# Patient Record
Sex: Female | Born: 1960
Health system: Southern US, Community
[De-identification: ages and names within clinical notes are randomized; demographics above are authoritative.]

## PROBLEM LIST (undated history)

## (undated) DIAGNOSIS — F329 Major depressive disorder, single episode, unspecified: Secondary | ICD-10-CM

## (undated) DIAGNOSIS — F419 Anxiety disorder, unspecified: Secondary | ICD-10-CM

## (undated) DIAGNOSIS — R51 Headache: Secondary | ICD-10-CM

## (undated) DIAGNOSIS — IMO0001 Reserved for inherently not codable concepts without codable children: Secondary | ICD-10-CM

## (undated) DIAGNOSIS — J45909 Unspecified asthma, uncomplicated: Secondary | ICD-10-CM

## (undated) DIAGNOSIS — I10 Essential (primary) hypertension: Secondary | ICD-10-CM

## (undated) DIAGNOSIS — E7800 Pure hypercholesterolemia, unspecified: Secondary | ICD-10-CM

## (undated) DIAGNOSIS — R112 Nausea with vomiting, unspecified: Secondary | ICD-10-CM

## (undated) DIAGNOSIS — E119 Type 2 diabetes mellitus without complications: Secondary | ICD-10-CM

## (undated) DIAGNOSIS — M1990 Unspecified osteoarthritis, unspecified site: Secondary | ICD-10-CM

## (undated) DIAGNOSIS — F32A Depression, unspecified: Secondary | ICD-10-CM

## (undated) DIAGNOSIS — R55 Syncope and collapse: Secondary | ICD-10-CM

## (undated) DIAGNOSIS — D649 Anemia, unspecified: Secondary | ICD-10-CM

## (undated) DIAGNOSIS — I471 Supraventricular tachycardia, unspecified: Secondary | ICD-10-CM

## (undated) DIAGNOSIS — R519 Headache, unspecified: Secondary | ICD-10-CM

## (undated) DIAGNOSIS — G4730 Sleep apnea, unspecified: Secondary | ICD-10-CM

## (undated) DIAGNOSIS — Z9889 Other specified postprocedural states: Secondary | ICD-10-CM

## (undated) DIAGNOSIS — K635 Polyp of colon: Secondary | ICD-10-CM

## (undated) DIAGNOSIS — K219 Gastro-esophageal reflux disease without esophagitis: Secondary | ICD-10-CM

## (undated) HISTORY — DX: Type 2 diabetes mellitus without complications: E11.9

## (undated) HISTORY — PX: COLONOSCOPY: SHX174

## (undated) HISTORY — PX: JOINT REPLACEMENT: SHX530

## (undated) HISTORY — PX: OTHER SURGICAL HISTORY: SHX169

## (undated) HISTORY — DX: Unspecified asthma, uncomplicated: J45.909

## (undated) HISTORY — DX: Polyp of colon: K63.5

## (undated) HISTORY — PX: KNEE ARTHROSCOPY: SUR90

## (undated) HISTORY — DX: Gastro-esophageal reflux disease without esophagitis: K21.9

## (undated) HISTORY — PX: ENDOMETRIAL ABLATION W/ NOVASURE: SUR434

## (undated) HISTORY — PX: SPINE SURGERY: SHX786

## (undated) HISTORY — PX: TUBAL LIGATION: SHX77

## (undated) HISTORY — DX: Supraventricular tachycardia, unspecified: I47.10

---

## 1998-01-20 ENCOUNTER — Other Ambulatory Visit: Admission: RE | Admit: 1998-01-20 | Discharge: 1998-01-20 | Payer: Self-pay | Admitting: Obstetrics & Gynecology

## 1998-02-20 ENCOUNTER — Encounter: Admission: RE | Admit: 1998-02-20 | Discharge: 1998-05-21 | Payer: Self-pay

## 1998-12-01 ENCOUNTER — Encounter: Admission: RE | Admit: 1998-12-01 | Discharge: 1999-01-08 | Payer: Self-pay

## 1999-06-25 ENCOUNTER — Other Ambulatory Visit: Admission: RE | Admit: 1999-06-25 | Discharge: 1999-06-25 | Payer: Self-pay | Admitting: Obstetrics & Gynecology

## 2000-05-02 ENCOUNTER — Other Ambulatory Visit: Admission: RE | Admit: 2000-05-02 | Discharge: 2000-05-02 | Payer: Self-pay | Admitting: Obstetrics & Gynecology

## 2001-03-20 ENCOUNTER — Encounter: Admission: RE | Admit: 2001-03-20 | Discharge: 2001-06-18 | Payer: Self-pay | Admitting: Neurology

## 2001-05-07 ENCOUNTER — Other Ambulatory Visit: Admission: RE | Admit: 2001-05-07 | Discharge: 2001-05-07 | Payer: Self-pay | Admitting: Obstetrics & Gynecology

## 2001-06-14 ENCOUNTER — Encounter: Payer: Self-pay | Admitting: Family Medicine

## 2001-06-14 ENCOUNTER — Ambulatory Visit (HOSPITAL_COMMUNITY): Admission: RE | Admit: 2001-06-14 | Discharge: 2001-06-14 | Payer: Self-pay | Admitting: Family Medicine

## 2001-06-18 ENCOUNTER — Encounter: Admission: RE | Admit: 2001-06-18 | Discharge: 2001-09-16 | Payer: Self-pay | Admitting: Neurology

## 2001-11-26 ENCOUNTER — Encounter: Admission: RE | Admit: 2001-11-26 | Discharge: 2002-02-24 | Payer: Self-pay | Admitting: Neurology

## 2002-03-20 ENCOUNTER — Encounter: Admission: RE | Admit: 2002-03-20 | Discharge: 2002-06-18 | Payer: Self-pay | Admitting: Neurology

## 2002-05-08 ENCOUNTER — Other Ambulatory Visit: Admission: RE | Admit: 2002-05-08 | Discharge: 2002-05-08 | Payer: Self-pay | Admitting: Obstetrics & Gynecology

## 2002-10-08 ENCOUNTER — Encounter: Admission: RE | Admit: 2002-10-08 | Discharge: 2003-01-06 | Payer: Self-pay | Admitting: Neurology

## 2003-04-03 ENCOUNTER — Encounter: Admission: RE | Admit: 2003-04-03 | Discharge: 2003-07-02 | Payer: Self-pay | Admitting: Neurology

## 2003-06-25 ENCOUNTER — Other Ambulatory Visit: Admission: RE | Admit: 2003-06-25 | Discharge: 2003-06-25 | Payer: Self-pay | Admitting: Obstetrics & Gynecology

## 2003-12-26 ENCOUNTER — Other Ambulatory Visit: Admission: RE | Admit: 2003-12-26 | Discharge: 2003-12-26 | Payer: Self-pay | Admitting: Obstetrics & Gynecology

## 2004-09-22 ENCOUNTER — Other Ambulatory Visit: Admission: RE | Admit: 2004-09-22 | Discharge: 2004-09-22 | Payer: Self-pay | Admitting: Obstetrics & Gynecology

## 2005-07-25 HISTORY — PX: CARPAL TUNNEL RELEASE: SHX101

## 2008-02-25 ENCOUNTER — Ambulatory Visit (HOSPITAL_COMMUNITY): Admission: RE | Admit: 2008-02-25 | Discharge: 2008-02-25 | Payer: Self-pay | Admitting: Obstetrics & Gynecology

## 2008-04-20 ENCOUNTER — Ambulatory Visit (HOSPITAL_COMMUNITY): Admission: RE | Admit: 2008-04-20 | Discharge: 2008-04-20 | Payer: Self-pay | Admitting: Family Medicine

## 2009-06-26 ENCOUNTER — Ambulatory Visit (HOSPITAL_BASED_OUTPATIENT_CLINIC_OR_DEPARTMENT_OTHER): Admission: RE | Admit: 2009-06-26 | Discharge: 2009-06-26 | Payer: Self-pay | Admitting: Orthopedic Surgery

## 2009-07-31 ENCOUNTER — Ambulatory Visit (HOSPITAL_BASED_OUTPATIENT_CLINIC_OR_DEPARTMENT_OTHER): Admission: RE | Admit: 2009-07-31 | Discharge: 2009-07-31 | Payer: Self-pay | Admitting: Orthopedic Surgery

## 2010-01-19 ENCOUNTER — Emergency Department (HOSPITAL_COMMUNITY): Admission: EM | Admit: 2010-01-19 | Discharge: 2010-01-19 | Payer: Self-pay | Admitting: Emergency Medicine

## 2010-07-11 ENCOUNTER — Emergency Department (HOSPITAL_COMMUNITY)
Admission: EM | Admit: 2010-07-11 | Discharge: 2010-07-12 | Payer: Self-pay | Source: Home / Self Care | Admitting: Emergency Medicine

## 2010-07-25 HISTORY — PX: BACK SURGERY: SHX140

## 2010-08-20 ENCOUNTER — Encounter
Admission: RE | Admit: 2010-08-20 | Discharge: 2010-08-20 | Payer: Self-pay | Source: Home / Self Care | Attending: Orthopedic Surgery | Admitting: Orthopedic Surgery

## 2010-09-10 ENCOUNTER — Other Ambulatory Visit: Payer: Self-pay | Admitting: Neurosurgery

## 2010-09-10 DIAGNOSIS — M545 Low back pain, unspecified: Secondary | ICD-10-CM

## 2010-09-14 ENCOUNTER — Other Ambulatory Visit: Payer: Self-pay

## 2010-10-04 LAB — DIFFERENTIAL
Basophils Absolute: 0 10*3/uL (ref 0.0–0.1)
Basophils Relative: 0 % (ref 0–1)
Eosinophils Absolute: 0 10*3/uL (ref 0.0–0.7)
Eosinophils Relative: 0 % (ref 0–5)
Lymphocytes Relative: 23 % (ref 12–46)
Lymphs Abs: 2 10*3/uL (ref 0.7–4.0)
Monocytes Absolute: 0.8 10*3/uL (ref 0.1–1.0)
Monocytes Relative: 8 % (ref 3–12)
Neutro Abs: 6.2 10*3/uL (ref 1.7–7.7)
Neutrophils Relative %: 69 % (ref 43–77)

## 2010-10-04 LAB — CBC
HCT: 41.9 % (ref 36.0–46.0)
Hemoglobin: 14.7 g/dL (ref 12.0–15.0)
MCH: 32.2 pg (ref 26.0–34.0)
MCHC: 35.1 g/dL (ref 30.0–36.0)
MCV: 91.7 fL (ref 78.0–100.0)
Platelets: 237 10*3/uL (ref 150–400)
RBC: 4.57 MIL/uL (ref 3.87–5.11)
RDW: 12.9 % (ref 11.5–15.5)
WBC: 9.1 10*3/uL (ref 4.0–10.5)

## 2010-10-04 LAB — COMPREHENSIVE METABOLIC PANEL
ALT: 35 U/L (ref 0–35)
AST: 24 U/L (ref 0–37)
Albumin: 3.8 g/dL (ref 3.5–5.2)
Alkaline Phosphatase: 128 U/L — ABNORMAL HIGH (ref 39–117)
BUN: 9 mg/dL (ref 6–23)
CO2: 23 mEq/L (ref 19–32)
Calcium: 9.5 mg/dL (ref 8.4–10.5)
Chloride: 101 mEq/L (ref 96–112)
Creatinine, Ser: 0.9 mg/dL (ref 0.4–1.2)
GFR calc Af Amer: >60 mL/min (ref >60)
GFR calc non Af Amer: >60 mL/min (ref >60)
Glucose, Bld: 103 mg/dL — ABNORMAL HIGH (ref 70–99)
Potassium: 3.4 mEq/L — ABNORMAL LOW (ref 3.5–5.1)
Sodium: 136 mEq/L (ref 135–145)
Total Bilirubin: 0.9 mg/dL (ref 0.3–1.2)
Total Protein: 7.5 g/dL (ref 6.0–8.3)

## 2010-10-04 LAB — POCT PREGNANCY, URINE: Preg Test, Ur: NEGATIVE

## 2010-10-04 LAB — URINALYSIS, ROUTINE W REFLEX MICROSCOPIC
Bilirubin Urine: NEGATIVE
Glucose, UA: NEGATIVE mg/dL
Hgb urine dipstick: NEGATIVE
Ketones, ur: 15 mg/dL — AB
Nitrite: NEGATIVE
Protein, ur: NEGATIVE mg/dL
Specific Gravity, Urine: 1.018 (ref 1.005–1.030)
Urobilinogen, UA: 1 mg/dL (ref 0.0–1.0)
pH: 7 (ref 5.0–8.0)

## 2010-10-10 LAB — BASIC METABOLIC PANEL
BUN: 7 mg/dL (ref 6–23)
CO2: 25 mEq/L (ref 19–32)
Calcium: 9 mg/dL (ref 8.4–10.5)
Chloride: 105 mEq/L (ref 96–112)
Creatinine, Ser: 0.68 mg/dL (ref 0.4–1.2)
GFR calc Af Amer: >60 mL/min (ref >60)
GFR calc non Af Amer: >60 mL/min (ref >60)
Glucose, Bld: 103 mg/dL — ABNORMAL HIGH (ref 70–99)
Potassium: 4.5 mEq/L (ref 3.5–5.1)
Sodium: 137 mEq/L (ref 135–145)

## 2010-10-10 LAB — DIFFERENTIAL
Basophils Absolute: 0.1 10*3/uL (ref 0.0–0.1)
Basophils Relative: 1 % (ref 0–1)
Eosinophils Absolute: 0.1 10*3/uL (ref 0.0–0.7)
Eosinophils Relative: 2 % (ref 0–5)
Lymphocytes Relative: 26 % (ref 12–46)
Lymphs Abs: 2.3 10*3/uL (ref 0.7–4.0)
Monocytes Absolute: 0.7 10*3/uL (ref 0.1–1.0)
Monocytes Relative: 8 % (ref 3–12)
Neutro Abs: 5.7 10*3/uL (ref 1.7–7.7)
Neutrophils Relative %: 64 % (ref 43–77)

## 2010-10-10 LAB — URINE MICROSCOPIC-ADD ON

## 2010-10-10 LAB — URINALYSIS, ROUTINE W REFLEX MICROSCOPIC
Bilirubin Urine: NEGATIVE
Glucose, UA: NEGATIVE mg/dL
Hgb urine dipstick: NEGATIVE
Ketones, ur: NEGATIVE mg/dL
Nitrite: NEGATIVE
Protein, ur: NEGATIVE mg/dL
Specific Gravity, Urine: 1.025 (ref 1.005–1.030)
Urobilinogen, UA: 1 mg/dL (ref 0.0–1.0)
pH: 6.5 (ref 5.0–8.0)

## 2010-10-10 LAB — CBC
HCT: 40.4 % (ref 36.0–46.0)
Hemoglobin: 14.2 g/dL (ref 12.0–15.0)
MCH: 33 pg (ref 26.0–34.0)
MCHC: 35.2 g/dL (ref 30.0–36.0)
MCV: 93.6 fL (ref 78.0–100.0)
Platelets: 264 10*3/uL (ref 150–400)
RBC: 4.31 MIL/uL (ref 3.87–5.11)
RDW: 13 % (ref 11.5–15.5)
WBC: 8.9 10*3/uL (ref 4.0–10.5)

## 2010-10-10 LAB — POCT I-STAT, CHEM 8
BUN: 11 mg/dL (ref 6–23)
Calcium, Ion: 1.09 mmol/L — ABNORMAL LOW (ref 1.12–1.32)
Chloride: 104 mEq/L (ref 96–112)
Creatinine, Ser: 0.8 mg/dL (ref 0.4–1.2)
Glucose, Bld: 108 mg/dL — ABNORMAL HIGH (ref 70–99)
HCT: 42 % (ref 36.0–46.0)
Hemoglobin: 14.3 g/dL (ref 12.0–15.0)
Potassium: 4 mEq/L (ref 3.5–5.1)
Sodium: 137 mEq/L (ref 135–145)
TCO2: 24 mmol/L (ref 0–100)

## 2010-10-10 LAB — POCT HEMOGLOBIN-HEMACUE: Hemoglobin: 14 g/dL (ref 12.0–15.0)

## 2010-10-11 ENCOUNTER — Ambulatory Visit
Admission: RE | Admit: 2010-10-11 | Discharge: 2010-10-11 | Disposition: A | Discharge status: Home / Self Care | Payer: BC Managed Care – PPO | Source: Ambulatory Visit | Attending: Neurosurgery | Admitting: Neurosurgery

## 2010-10-11 DIAGNOSIS — M545 Low back pain, unspecified: Secondary | ICD-10-CM

## 2010-10-14 ENCOUNTER — Other Ambulatory Visit: Payer: Self-pay | Admitting: Neurosurgery

## 2010-10-14 DIAGNOSIS — M25551 Pain in right hip: Secondary | ICD-10-CM

## 2010-10-16 ENCOUNTER — Ambulatory Visit
Admission: RE | Admit: 2010-10-16 | Discharge: 2010-10-16 | Disposition: A | Discharge status: Home / Self Care | Payer: BC Managed Care – PPO | Source: Ambulatory Visit | Attending: Neurosurgery | Admitting: Neurosurgery

## 2010-10-16 DIAGNOSIS — M25551 Pain in right hip: Secondary | ICD-10-CM

## 2010-10-26 LAB — BASIC METABOLIC PANEL
BUN: 7 mg/dL (ref 6–23)
CO2: 25 mEq/L (ref 19–32)
Calcium: 9.2 mg/dL (ref 8.4–10.5)
Chloride: 101 mEq/L (ref 96–112)
Creatinine, Ser: 0.78 mg/dL (ref 0.4–1.2)
GFR calc Af Amer: >60 mL/min (ref >60)
GFR calc non Af Amer: >60 mL/min (ref >60)
Glucose, Bld: 101 mg/dL — ABNORMAL HIGH (ref 70–99)
Potassium: 3.7 mEq/L (ref 3.5–5.1)
Sodium: 136 mEq/L (ref 135–145)

## 2010-10-26 LAB — POCT HEMOGLOBIN-HEMACUE: Hemoglobin: 13.7 g/dL (ref 12.0–15.0)

## 2010-12-07 NOTE — Op Note (Signed)
[name], [name]                ACCOUNT NO.:  [id number]   MEDICAL RECORD NO.:  [id number]          PATIENT TYPE:  AMB   LOCATION:  SDC                           FACILITY:  [facility]   PHYSICIAN:  Ilda Mori, M.D.   DATE OF BIRTH:  18-Dec-1960   DATE OF PROCEDURE:  DATE OF DISCHARGE:                               OPERATIVE REPORT   PREOPERATIVE DIAGNOSIS:  Menorrhagia.   POSTOPERATIVE DIAGNOSIS:  Menorrhagia.   PROCEDURE:  NovaSure endometrial ablation.   SURGEON:  Ilda Mori, MD.   ANESTHESIA:  General.   ESTIMATED BLOOD LOSS:  Minimal.   FINDINGS:  The uterine cavity sounded to a total of 9.3 cm, the cervix  sounded to 3.3 cm, and the uterus was anterior.   COMPLICATIONS:  None.   INDICATIONS:  This is a 50 year old gravida 3, para 2 female who has had  over a year of heavy painful of vaginal bleeding.  Options were  discussed with the patient, who elected to proceed with NovaSure  endometrial ablation.  A sonohysterogram was performed in the office  which showed an 8.5-cm uterus with no intracavitary lesions.  The  patient was brought to the operating room and general anesthesia was  induced.  She was placed in dorsal lithotomy position and the perineum  and vagina were prepped and draped in a sterile fashion.  The anterior  lip of the cervix was grasped with single-tooth tenaculum.  The cervix  was sounded to 3.3 cm.  The uterus was sounded to a total of 9.3 cm with  a cavity length being determined to be 6 cm.  The internal os was  dilated with Pratt dilator to a 27-French.  The NovaSure ablator was  introduced into the endometrial cavity to the fundus and then withdrawn  1 cm and deployed.  The final width of the NovaSure deployment was 4.8  cm.  The CO2 test was passed and the endometrium was ablated at a power  of 158 watts for 1 minute and 20 seconds.  The procedure was then  terminated.  The NovaSure ablator was removed without difficulty and the  patient  left the operating room in good condition.      Ilda Mori, M.D.  Electronically Signed     RK/MEDQ  D:  02/25/2008  T:  02/26/2008  Job:  [id number]

## 2010-12-12 ENCOUNTER — Emergency Department (HOSPITAL_COMMUNITY)
Admission: EM | Admit: 2010-12-12 | Discharge: 2010-12-12 | Disposition: A | Discharge status: Home / Self Care | Payer: BC Managed Care – PPO | Attending: Emergency Medicine | Admitting: Emergency Medicine

## 2010-12-12 DIAGNOSIS — M545 Low back pain, unspecified: Secondary | ICD-10-CM | POA: Insufficient documentation

## 2010-12-12 DIAGNOSIS — Z79899 Other long term (current) drug therapy: Secondary | ICD-10-CM | POA: Insufficient documentation

## 2010-12-12 DIAGNOSIS — M79609 Pain in unspecified limb: Secondary | ICD-10-CM | POA: Insufficient documentation

## 2010-12-12 DIAGNOSIS — F341 Dysthymic disorder: Secondary | ICD-10-CM | POA: Insufficient documentation

## 2010-12-12 DIAGNOSIS — G8929 Other chronic pain: Secondary | ICD-10-CM | POA: Insufficient documentation

## 2010-12-12 DIAGNOSIS — M533 Sacrococcygeal disorders, not elsewhere classified: Secondary | ICD-10-CM | POA: Insufficient documentation

## 2010-12-12 DIAGNOSIS — E7800 Pure hypercholesterolemia, unspecified: Secondary | ICD-10-CM | POA: Insufficient documentation

## 2010-12-12 DIAGNOSIS — I10 Essential (primary) hypertension: Secondary | ICD-10-CM | POA: Insufficient documentation

## 2010-12-27 ENCOUNTER — Ambulatory Visit (HOSPITAL_COMMUNITY)
Admission: RE | Admit: 2010-12-27 | Discharge: 2010-12-27 | Disposition: A | Discharge status: Home / Self Care | Payer: BC Managed Care – PPO | Source: Ambulatory Visit | Attending: Neurosurgery | Admitting: Neurosurgery

## 2010-12-27 ENCOUNTER — Encounter (HOSPITAL_COMMUNITY)
Admission: RE | Admit: 2010-12-27 | Discharge: 2010-12-27 | Disposition: A | Discharge status: Home / Self Care | Payer: BC Managed Care – PPO | Source: Ambulatory Visit | Attending: Neurosurgery | Admitting: Neurosurgery

## 2010-12-27 ENCOUNTER — Other Ambulatory Visit (HOSPITAL_COMMUNITY): Payer: Self-pay | Admitting: Neurosurgery

## 2010-12-27 DIAGNOSIS — Z01818 Encounter for other preprocedural examination: Secondary | ICD-10-CM | POA: Insufficient documentation

## 2010-12-27 DIAGNOSIS — Z01810 Encounter for preprocedural cardiovascular examination: Secondary | ICD-10-CM | POA: Insufficient documentation

## 2010-12-27 DIAGNOSIS — M48061 Spinal stenosis, lumbar region without neurogenic claudication: Secondary | ICD-10-CM

## 2010-12-27 DIAGNOSIS — G4730 Sleep apnea, unspecified: Secondary | ICD-10-CM | POA: Insufficient documentation

## 2010-12-27 DIAGNOSIS — Z01812 Encounter for preprocedural laboratory examination: Secondary | ICD-10-CM | POA: Insufficient documentation

## 2010-12-27 DIAGNOSIS — I10 Essential (primary) hypertension: Secondary | ICD-10-CM | POA: Insufficient documentation

## 2010-12-27 LAB — CBC
HCT: 40.4 % (ref 36.0–46.0)
Hemoglobin: 14.1 g/dL (ref 12.0–15.0)
MCH: 32.3 pg (ref 26.0–34.0)
MCHC: 34.9 g/dL (ref 30.0–36.0)
MCV: 92.4 fL (ref 78.0–100.0)
Platelets: 227 10*3/uL (ref 150–400)
RBC: 4.37 MIL/uL (ref 3.87–5.11)
RDW: 12.8 % (ref 11.5–15.5)
WBC: 6.9 10*3/uL (ref 4.0–10.5)

## 2010-12-27 LAB — DIFFERENTIAL
Basophils Absolute: 0 10*3/uL (ref 0.0–0.1)
Basophils Relative: 0 % (ref 0–1)
Eosinophils Absolute: 0.1 10*3/uL (ref 0.0–0.7)
Eosinophils Relative: 2 % (ref 0–5)
Lymphocytes Relative: 31 % (ref 12–46)
Lymphs Abs: 2.1 10*3/uL (ref 0.7–4.0)
Monocytes Absolute: 0.6 10*3/uL (ref 0.1–1.0)
Monocytes Relative: 9 % (ref 3–12)
Neutro Abs: 4 10*3/uL (ref 1.7–7.7)
Neutrophils Relative %: 58 % (ref 43–77)

## 2010-12-27 LAB — TYPE AND SCREEN
ABO/RH(D): A POS
Antibody Screen: NEGATIVE

## 2010-12-27 LAB — BASIC METABOLIC PANEL
BUN: 13 mg/dL (ref 6–23)
CO2: 26 mEq/L (ref 19–32)
Calcium: 9.8 mg/dL (ref 8.4–10.5)
Chloride: 100 mEq/L (ref 96–112)
Creatinine, Ser: 0.74 mg/dL (ref 0.4–1.2)
GFR calc Af Amer: >60 mL/min (ref >60)
GFR calc non Af Amer: >60 mL/min (ref >60)
Glucose, Bld: 104 mg/dL — ABNORMAL HIGH (ref 70–99)
Potassium: 4.2 mEq/L (ref 3.5–5.1)
Sodium: 137 mEq/L (ref 135–145)

## 2010-12-27 LAB — SURGICAL PCR SCREEN
MRSA, PCR: NEGATIVE
Staphylococcus aureus: POSITIVE — AB

## 2010-12-27 LAB — ABO/RH: ABO/RH(D): A POS

## 2011-01-07 ENCOUNTER — Inpatient Hospital Stay (HOSPITAL_COMMUNITY): Payer: BC Managed Care – PPO

## 2011-01-07 ENCOUNTER — Inpatient Hospital Stay (HOSPITAL_COMMUNITY)
Admission: RE | Admit: 2011-01-07 | Discharge: 2011-01-12 | DRG: 756 | Disposition: A | Discharge status: Home / Self Care | Payer: BC Managed Care – PPO | Source: Ambulatory Visit | Attending: Neurosurgery | Admitting: Neurosurgery

## 2011-01-07 DIAGNOSIS — G4733 Obstructive sleep apnea (adult) (pediatric): Secondary | ICD-10-CM | POA: Diagnosis present

## 2011-01-07 DIAGNOSIS — K219 Gastro-esophageal reflux disease without esophagitis: Secondary | ICD-10-CM | POA: Diagnosis present

## 2011-01-07 DIAGNOSIS — F3289 Other specified depressive episodes: Secondary | ICD-10-CM | POA: Diagnosis present

## 2011-01-07 DIAGNOSIS — I10 Essential (primary) hypertension: Secondary | ICD-10-CM | POA: Diagnosis present

## 2011-01-07 DIAGNOSIS — G8929 Other chronic pain: Secondary | ICD-10-CM | POA: Diagnosis present

## 2011-01-07 DIAGNOSIS — IMO0002 Reserved for concepts with insufficient information to code with codable children: Secondary | ICD-10-CM | POA: Diagnosis present

## 2011-01-07 DIAGNOSIS — M48061 Spinal stenosis, lumbar region without neurogenic claudication: Principal | ICD-10-CM | POA: Diagnosis present

## 2011-01-07 DIAGNOSIS — E669 Obesity, unspecified: Secondary | ICD-10-CM | POA: Diagnosis present

## 2011-01-07 DIAGNOSIS — F329 Major depressive disorder, single episode, unspecified: Secondary | ICD-10-CM | POA: Diagnosis present

## 2011-01-08 ENCOUNTER — Inpatient Hospital Stay (HOSPITAL_COMMUNITY): Payer: BC Managed Care – PPO

## 2011-01-08 LAB — CBC
HCT: 30 % — ABNORMAL LOW (ref 36.0–46.0)
Hemoglobin: 10.1 g/dL — ABNORMAL LOW (ref 12.0–15.0)
MCH: 31.8 pg (ref 26.0–34.0)
MCHC: 33.7 g/dL (ref 30.0–36.0)
MCV: 94.3 fL (ref 78.0–100.0)
Platelets: 205 10*3/uL (ref 150–400)
RBC: 3.18 MIL/uL — ABNORMAL LOW (ref 3.87–5.11)
RDW: 13 % (ref 11.5–15.5)
WBC: 10 10*3/uL (ref 4.0–10.5)

## 2011-01-08 LAB — URINALYSIS, ROUTINE W REFLEX MICROSCOPIC
Bilirubin Urine: NEGATIVE
Glucose, UA: NEGATIVE mg/dL
Hgb urine dipstick: NEGATIVE
Ketones, ur: NEGATIVE mg/dL
Leukocytes, UA: NEGATIVE
Nitrite: NEGATIVE
Protein, ur: NEGATIVE mg/dL
Specific Gravity, Urine: 1.021 (ref 1.005–1.030)
Urobilinogen, UA: 0.2 mg/dL (ref 0.0–1.0)
pH: 5.5 (ref 5.0–8.0)

## 2011-01-15 LAB — CULTURE, BLOOD (ROUTINE X 2)
Culture  Setup Time: 201206170210
Culture  Setup Time: 201206170210
Culture: NO GROWTH
Culture: NO GROWTH

## 2011-01-21 NOTE — Op Note (Signed)
NAMERHILEY, TARVER NO.:  [id number]  MEDICAL RECORD NO.:  [id number]  LOCATION:  3004                         FACILITY:  [facility]  PHYSICIAN:  Kathaleen Maser. Azalea Cedar, M.D.    DATE OF BIRTH:  [birth year]  DATE OF PROCEDURE:  01/07/2011 DATE OF DISCHARGE:                              OPERATIVE REPORT   PREOPERATIVE DIAGNOSIS:  L4-5 and L5-S1 stenosis with chronic back pain and radiculopathy.  POSTOPERATIVE DIAGNOSIS:  L4-5 and L5-S1 stenosis with chronic back pain and radiculopathy.  PROCEDURE NAME:  L4-5 and L5-S1 decompressive laminectomy with bilateral L4, L5, and S1 decompressive foraminotomies, more than it would be required for simple interbody fusion alone.  L4-5 and L5-S1 posterior lumbar interbody fusion utilizing tangent interbody allograft wedge, Telamon interbody PEEK cage, and local autografting.  L4-5 posterolateral arthrodesis utilizing segmental pedicle screw fixation and local autografting.  SURGEON:  Kathaleen Maser. Rupa Lagan, MD  ASSISTANT:  Tia Alert, MD  ANESTHESIA:  General endotracheal.  INDICATIONS:  [name] is a 50 year old female with history of back and right lower extremity pain failing all conservative management and workup demonstrates evidence of marked disk space collapse and severe foraminal stenosis at L4-5 and L5-S1.  [name] has failed all above conservative management and now presents for decompression and fusion in hopes of improving her symptoms.  OPERATIVE NOTE:  [name] was brought to Morgan operating room and placed on Morgan operating table in supine position.  After adequate level of anesthesia was achieved, [name] was positioned prone onto a Wilson frame and appropriately padded.  [name]'s lumbar region was prepped and draped sterilely.  A #10 blade was used to make curvilinear skin incision overlying Morgan L4-5 and L5-S1 levels.  This was carried sharply in Morgan midline.  A subperiosteal dissection was  performed exposing Morgan lamina and facet joints at L4-5 and S1 as well as transverse process of Morgan aforementioned levels.  Deep self-retaining retractor was placed.  Intraoperative fluoroscopy view was used and levels were confirmed.  Decompressive laminectomy was then performed using Leksell rongeurs, Kerrison rongeurs, and high-speed drill to remove Morgan entire lamina of L4, entire lamina of L5, superior aspect of lamina of S1.  Inferior facetectomies at L4-L5 were performed bilaterally.  Superior facetectomies of L5 and S1 was performed bilaterally.  Ligamentum flavum was elevated and resected in a piecemeal fashion.  Wide decompressive foraminotomies were then performed along Morgan course of exiting L4, L5, and S1 nerve roots.  Epidural venous plexus was coagulated and cut.  Bilateral diskectomies were then performed at L4-5 and L5-S1.  Disk space was then distracted up to 8 mm and 8 mm distractor left in [name]'s right side.  Morgan thecal sac and nerve roots were protected to Morgan left side.  Disk space was then reamed and then cut with 8-mm tangent instrument.  Soft tissues were removed from Morgan interspaces.  An 8 x 22 mm Telamon cage was then packed into place and recessed approximately 2 mm from Morgan posterior cortical margin of L4.  Distractors were removed from [name]'s contralateral side.  Disk space was then reamed and then cut with an 8-mm tangent  instrument on Morgan right side.  Thecal sac and nerve roots were protected on Morgan right side.  Disk space was then further curettaged.  Morselized autograft was packed in Morgan interspace for later fusion.  An 8 x 26 mm tangent wedge was then packed into place and recessed approximately 2 mm from Morgan posterior margin of L4.  Procedure was then repeated at L5-S1 again without complication.  Pedicles at L4, L5, and S1 were then identified bilaterally using surface landmarks and intraoperative fluoroscopy.  Superficial bone over Morgan  pedicle was then removed using high-speed drill.  Morgan pedicle hole was then probed using pedicle awl. Each pedicle awl track was then tapped with a 5.25- mm screw tapper. Each screw hole was then probed and found to be solidly within bone.  A 6.75 x 45 mm radius screw was placed bilaterally at L4, a 5.75 x 40 mm screw was placed at L5, and a 6.75 x 40 mm screw was placed bilaterally at S1.  Transverse processes of sacral ala were then decorticated utilizing a high-speed drill.  Morselized autograft was packed posterolaterally for later fusion.  Locking caps were placed over Morgan screw heads and locking caps were then engaged with construct under compression.  Final images revealed good position of Morgan bone grafts, hardware at proper operative level, and normal alignment of spine. Wound was then irrigated with antibiotic solution.  Gelfoam was placed topically for hemostasis and found to be good.  Microscope and retractor system were removed.  Hemostasis of muscle was achieved with electrocautery.  Morgan wound was then closed in layers with Vicryl sutures.  Steri-Strips and sterile dressing were applied.  There were no complications.  [name] tolerated Morgan procedure well and she returned to Morgan recovery room postoperatively.          ______________________________ Kathaleen Maser Ellinor Test, M.D.     HAP/MEDQ  D:  01/07/2011  T:  01/08/2011  Job:  [id number]  Electronically Signed by Julio Sicks M.D. on 01/21/2011 08:01:43 AM

## 2011-02-22 ENCOUNTER — Ambulatory Visit
Admission: RE | Admit: 2011-02-22 | Discharge: 2011-02-22 | Disposition: A | Discharge status: Home / Self Care | Payer: BC Managed Care – PPO | Source: Ambulatory Visit | Attending: Neurosurgery | Admitting: Neurosurgery

## 2011-02-22 ENCOUNTER — Other Ambulatory Visit: Payer: Self-pay | Admitting: Neurosurgery

## 2011-02-22 DIAGNOSIS — M48061 Spinal stenosis, lumbar region without neurogenic claudication: Secondary | ICD-10-CM

## 2011-04-08 NOTE — Discharge Summary (Signed)
  [name], [name] NO.:  [id number]  MEDICAL RECORD NO.:  [id number]  LOCATION:  3004                         FACILITY:  [facility]  PHYSICIAN:  Kathaleen Maser. Paris Hohn, M.D.    DATE OF BIRTH:  07-03-1961  DATE OF ADMISSION:  01/07/2011 DATE OF DISCHARGE:  01/12/2011                              DISCHARGE SUMMARY   FINAL DIAGNOSES:  L4-5, L5-S1 stenosis with chronic back pain and radiculopathy.  HISTORY OF PRESENT ILLNESS:  [name] is a 50 year old female with history of back and right lower extremity pain failing all conservative management.  Workup demonstrates evidence of marked disk space collapse and severe foraminal stenosis at L4-5 and L5-S1.  The patient presents now for decompression and fusion in hopes of improving her symptoms.  HOSPITAL COURSE:  The patient in the operating room, uncomplicated L4-5 and L5-S1 decompression and fusion with this situation was performed. Postop, the patient has done well.  She had intact motor and sensory function.  Her preoperative pain is much improved.  Back pain initially was quite substantial and gradually eased with time.  The patient was rapidly mobilized with the patient's physical and occupational therapy. She is up ambulating with minimal difficulty.  Wound is healing well. Plan is for discharge home.  DISCHARGE DISPOSITION:  The patient will be discharged home.  Will follow up in my office in 1 week.          ______________________________ Kathaleen Maser. [name], M.D.     HAP/MEDQ  D:  04/07/2011  T:  04/07/2011  Job:  [id number]  Electronically Signed by Julio Sicks M.D. on 04/08/2011 12:04:32 PM

## 2011-04-16 ENCOUNTER — Emergency Department (HOSPITAL_COMMUNITY): Payer: BC Managed Care – PPO

## 2011-04-16 ENCOUNTER — Emergency Department (HOSPITAL_COMMUNITY)
Admission: EM | Admit: 2011-04-16 | Discharge: 2011-04-16 | Disposition: A | Discharge status: Home / Self Care | Payer: BC Managed Care – PPO | Attending: Emergency Medicine | Admitting: Emergency Medicine

## 2011-04-16 DIAGNOSIS — Z79899 Other long term (current) drug therapy: Secondary | ICD-10-CM | POA: Insufficient documentation

## 2011-04-16 DIAGNOSIS — M549 Dorsalgia, unspecified: Secondary | ICD-10-CM | POA: Insufficient documentation

## 2011-04-16 DIAGNOSIS — I10 Essential (primary) hypertension: Secondary | ICD-10-CM | POA: Insufficient documentation

## 2011-04-21 ENCOUNTER — Ambulatory Visit
Admission: RE | Admit: 2011-04-21 | Discharge: 2011-04-21 | Disposition: A | Discharge status: Home / Self Care | Payer: BC Managed Care – PPO | Source: Ambulatory Visit | Attending: Neurosurgery | Admitting: Neurosurgery

## 2011-04-21 ENCOUNTER — Other Ambulatory Visit: Payer: Self-pay | Admitting: Neurosurgery

## 2011-04-21 DIAGNOSIS — M48061 Spinal stenosis, lumbar region without neurogenic claudication: Secondary | ICD-10-CM

## 2011-04-22 LAB — BASIC METABOLIC PANEL
BUN: 11
CO2: 25
Calcium: 9
Chloride: 101
Creatinine, Ser: 0.75
GFR calc Af Amer: >60
GFR calc non Af Amer: >60
Glucose, Bld: 117 — ABNORMAL HIGH
Potassium: 3.5
Sodium: 135

## 2011-04-22 LAB — CBC
HCT: 38.2
Hemoglobin: 13.1
MCHC: 34.3
MCV: 89.1
Platelets: 251
RBC: 4.29
RDW: 15.4
WBC: 6.6

## 2011-08-23 ENCOUNTER — Other Ambulatory Visit: Payer: Self-pay | Admitting: Neurosurgery

## 2011-08-23 DIAGNOSIS — M545 Low back pain, unspecified: Secondary | ICD-10-CM

## 2011-09-01 ENCOUNTER — Ambulatory Visit
Admission: RE | Admit: 2011-09-01 | Discharge: 2011-09-01 | Disposition: A | Discharge status: Home / Self Care | Payer: BC Managed Care – PPO | Source: Ambulatory Visit | Attending: Neurosurgery | Admitting: Neurosurgery

## 2011-09-01 DIAGNOSIS — M545 Low back pain, unspecified: Secondary | ICD-10-CM

## 2012-02-23 ENCOUNTER — Emergency Department (HOSPITAL_COMMUNITY)
Admission: EM | Admit: 2012-02-23 | Discharge: 2012-02-23 | Disposition: A | Discharge status: Home / Self Care | Payer: BC Managed Care – PPO | Attending: Emergency Medicine | Admitting: Emergency Medicine

## 2012-02-23 ENCOUNTER — Emergency Department (HOSPITAL_COMMUNITY): Payer: BC Managed Care – PPO

## 2012-02-23 ENCOUNTER — Encounter (HOSPITAL_COMMUNITY): Payer: Self-pay | Admitting: Emergency Medicine

## 2012-02-23 DIAGNOSIS — S0083XA Contusion of other part of head, initial encounter: Secondary | ICD-10-CM | POA: Insufficient documentation

## 2012-02-23 DIAGNOSIS — Z981 Arthrodesis status: Secondary | ICD-10-CM | POA: Insufficient documentation

## 2012-02-23 DIAGNOSIS — R220 Localized swelling, mass and lump, head: Secondary | ICD-10-CM | POA: Insufficient documentation

## 2012-02-23 DIAGNOSIS — F329 Major depressive disorder, single episode, unspecified: Secondary | ICD-10-CM | POA: Insufficient documentation

## 2012-02-23 DIAGNOSIS — S1093XA Contusion of unspecified part of neck, initial encounter: Secondary | ICD-10-CM | POA: Insufficient documentation

## 2012-02-23 DIAGNOSIS — S0003XA Contusion of scalp, initial encounter: Secondary | ICD-10-CM | POA: Insufficient documentation

## 2012-02-23 DIAGNOSIS — M25579 Pain in unspecified ankle and joints of unspecified foot: Secondary | ICD-10-CM | POA: Insufficient documentation

## 2012-02-23 DIAGNOSIS — G43909 Migraine, unspecified, not intractable, without status migrainosus: Secondary | ICD-10-CM | POA: Insufficient documentation

## 2012-02-23 DIAGNOSIS — M25569 Pain in unspecified knee: Secondary | ICD-10-CM | POA: Insufficient documentation

## 2012-02-23 DIAGNOSIS — F32A Depression, unspecified: Secondary | ICD-10-CM | POA: Insufficient documentation

## 2012-02-23 DIAGNOSIS — E7800 Pure hypercholesterolemia, unspecified: Secondary | ICD-10-CM | POA: Insufficient documentation

## 2012-02-23 DIAGNOSIS — I10 Essential (primary) hypertension: Secondary | ICD-10-CM | POA: Insufficient documentation

## 2012-02-23 DIAGNOSIS — M545 Low back pain, unspecified: Secondary | ICD-10-CM | POA: Insufficient documentation

## 2012-02-23 DIAGNOSIS — T1490XA Injury, unspecified, initial encounter: Secondary | ICD-10-CM | POA: Insufficient documentation

## 2012-02-23 DIAGNOSIS — R221 Localized swelling, mass and lump, neck: Secondary | ICD-10-CM | POA: Insufficient documentation

## 2012-02-23 DIAGNOSIS — R51 Headache: Secondary | ICD-10-CM | POA: Insufficient documentation

## 2012-02-23 HISTORY — DX: Major depressive disorder, single episode, unspecified: F32.9

## 2012-02-23 HISTORY — DX: Depression, unspecified: F32.A

## 2012-02-23 HISTORY — DX: Anxiety disorder, unspecified: F41.9

## 2012-02-23 HISTORY — DX: Essential (primary) hypertension: I10

## 2012-02-23 HISTORY — DX: Pure hypercholesterolemia, unspecified: E78.00
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
## 2012-02-23 NOTE — ED Notes (Signed)
Patient transported to CT 

## 2012-02-23 NOTE — ED Notes (Signed)
Patient transported to X-ray 

## 2012-02-23 NOTE — ED Notes (Signed)
C-collar removed per ED MD.

## 2012-02-23 NOTE — ED Notes (Signed)
Denies LOC. C/o h/a to above left eyebrow. Reports no other injuries. Stated, "I have a lot of back issues", denies increase or change in back pain. Pt appears drowsy, oriented x4 & answers all questions appropriately.
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
## 2012-06-04 ENCOUNTER — Encounter: Payer: Self-pay | Admitting: Cardiovascular Disease

## 2012-06-07 ENCOUNTER — Ambulatory Visit: Payer: BC Managed Care – PPO | Attending: Physician Assistant | Admitting: Sleep Medicine

## 2012-06-07 DIAGNOSIS — G4710 Hypersomnia, unspecified: Secondary | ICD-10-CM | POA: Insufficient documentation

## 2012-06-13 DIAGNOSIS — G4710 Hypersomnia, unspecified: Secondary | ICD-10-CM

## 2012-06-13 DIAGNOSIS — G4730 Sleep apnea, unspecified: Secondary | ICD-10-CM

## 2012-06-13 NOTE — Procedures (Signed)
NAMEJOHNNA, BOLLIER                 ACCOUNT NO.:  [id number]  MEDICAL RECORD NO.:  [id number]          PATIENT TYPE:  OUT  LOCATION:  SLEEP LAB                     FACILITY:  [facility]  PHYSICIAN:  Barbaraann Share, MD,FCCPDATE OF BIRTH:  August 28[birth year] 1962  DATE OF STUDY:  06/07/2012                           NOCTURNAL POLYSOMNOGRAM  REFERRING PHYSICIAN:  Ernestina Penna, M.D.  LOCATION:  Sleep Lab.  REFERRING PHYSICIAN:  Helene Kelp, PA-C.  INDICATION FOR STUDY:  Hypersomnia with sleep apnea.  EPWORTH SLEEPINESS SCORE:  1.  MEDICATIONS:  SLEEP ARCHITECTURE:  The patient had a total sleep time of 225 minutes with very little slow-wave sleep or REM.  Sleep onset latency was mildly prolonged at 44 minutes, and REM onset was prolonged at 212 minutes. Sleep efficiency was poor at 54%.  RESPIRATORY DATA:  The patient was found to have no apneas and only 1 obstructive hypopnea, giving her an apnea/hypopnea index of only 0.3 events per hour.  The patient was noted to have loud snoring throughout. She did not meet split night protocol secondary to the small numbers of events.  OXYGEN DATA:  There was O2 desaturation as low as 94%.  CARDIAC DATA:  Rare PVC noted, but no clinically significant arrhythmias were seen.  MOVEMENT-PARASOMNIA:  The patient was found to have small numbers of periodic limb movements that did not appear to disrupt sleep.  There were no abnormal behaviors noted.  IMPRESSION/RECOMMENDATION: 1. Small numbers of obstructive events, which do not meet the AHI     criteria for the obstructive sleep apnea syndrome.  There was no     significant oxygen desaturation.  The patient was noted to have     fragmented sleep with frequent episodes of alpha intrusion on her     EEG that can be seen in patients with chronic anxiety and other     psychiatric disorders.  Clinical correlation is suggested.  The     patient should also be encouraged to work aggressively on weight  loss. 2. Rare premature ventricular contraction noted, but no clinically     significant arrhythmias were seen.      Barbaraann Share, MD,FCCP Diplomate, American Board of Sleep Medicine    [facility]/MEDQ  D:  06/13/2012 08:15:04  T:  06/13/2012 12:46:13  Job:  [id number]

## 2012-06-19 LAB — HM MAMMOGRAPHY

## 2012-10-02 ENCOUNTER — Ambulatory Visit (INDEPENDENT_AMBULATORY_CARE_PROVIDER_SITE_OTHER): Payer: BC Managed Care – PPO | Admitting: Cardiovascular Disease

## 2012-10-02 ENCOUNTER — Encounter: Payer: Self-pay | Admitting: Cardiovascular Disease

## 2012-10-02 VITALS — BP 138/76 | HR 91 | Ht 65.0 in | Wt 290.4 lb

## 2012-10-02 DIAGNOSIS — R0602 Shortness of breath: Secondary | ICD-10-CM

## 2012-10-02 DIAGNOSIS — R072 Precordial pain: Secondary | ICD-10-CM | POA: Insufficient documentation

## 2012-10-02 NOTE — Patient Instructions (Addendum)
Your physician has requested that you have an echocardiogram. Echocardiography is a painless test that uses sound waves to create images of your heart. It provides your doctor with information about the size and shape of your heart and how well your heart's chambers and valves are working. This procedure takes approximately one hour. There are no restrictions for this procedure.  Your physician has requested that you have a lexiscan myoview. For further information please visit [email or web address] Please follow instruction sheet, as given.  Your physician recommends that you continue on your current medications as directed. Please refer to the Current Medication list given to you today.  Your physician wants you to follow-up in: 6 MONTHS with Dr Excell Seltzer.  You will receive a reminder letter in the mail two months in advance. If you don't receive a letter, please call our office to schedule the follow-up appointment.

## 2012-10-02 NOTE — Progress Notes (Signed)
HPI:   52 year old woman presenting for initial cardiac evaluation. The patient has a multitude of symptoms that she is concerned about. These include exertional chest discomfort, exertional shortness of breath, and leg swelling. She's had shortness of breath with activity now for about 5 years. This is progressive. She has not had resting shortness of breath. She denies orthopnea or PND. She has had palpitations. She's noted leg swelling and generalized fatigue. She has gained a great deal of weight over the last several years. When she walks or exerts herself, she feels a pressure-like sensation across the front of her chest and in the left arm. This resolves with rest. She has no personal history of cardiac problems. She has no cough, fever, chills, or other systemic complaints.  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. She is a lifetime nonsmoker and she does not drink alcohol. She does have a family history of coronary artery disease. Her father had a myocardial infarction in his 47s. Her mother, sister, and brother all have hypertension.  Outpatient Encounter Prescriptions as of 10/02/2012  Medication Sig Dispense Refill  . ALPRAZolam (XANAX) 0.5 MG tablet Take 1 mg by mouth at bedtime.       Marland Kitchen amLODipine (NORVASC) 10 MG tablet Take 10 mg by mouth daily.      . Cholecalciferol (VITAMIN D) 2000 UNITS CAPS Take 1 capsule by mouth every evening.      . Coenzyme Q10 (CO Q-10) 200 MG CAPS Take by mouth.      . furosemide (LASIX) 20 MG tablet Take 20 mg by mouth daily.      Marland Kitchen lisinopril (PRINIVIL,ZESTRIL) 20 MG tablet Take 20 mg by mouth daily.      . Melatonin 10 MG CAPS Take 20 mg by mouth at bedtime.      . pravastatin (PRAVACHOL) 40 MG tablet Take 40 mg by mouth every evening.      . promethazine (PHENERGAN) 25 MG tablet Take 25 mg by mouth every 6 (six) hours as needed. For nausea      . sertraline (ZOLOFT) 50 MG tablet Take 100 mg by mouth 2 (two)  times daily.       . SUMAtriptan (IMITREX) 100 MG tablet Take 100 mg by mouth every 2 (two) hours as needed. For migraines      . zolpidem (AMBIEN CR) 12.5 MG CR tablet Take 12.5 mg by mouth at bedtime.      Marland Kitchen HYDROcodone-acetaminophen (NORCO) 10-325 MG per tablet Take 1 tablet by mouth every 6 (six) hours as needed. For pain      . [DISCONTINUED] zolpidem (AMBIEN) 10 MG tablet Take 10 mg by mouth at bedtime.       No facility-administered encounter medications on file as of 10/02/2012.    Ceclor and Venlafaxine  Past Medical History  Diagnosis Date  . Chronic back pain   . Anxiety   . Depression   . Hypertension   . High cholesterol   . Migraines     Past Surgical History  Procedure Laterality Date  . Back surgery    . Endometrial ablation w/ novasure      History   Social History  . Marital Status: Married    Spouse Name: N/A    Number of Children: N/A  . Years of Education: N/A   Occupational History  . Not on file.   Social History Main Topics  . Smoking status: Never Smoker   .  Smokeless tobacco: Not on file  . Alcohol Use: No  . Drug Use: No  . Sexually Active:    Other Topics Concern  . Not on file   Social History Narrative  . No narrative on file   FHx: Father MI in 54s, mother with "hole in heart"  ROS:  General: no fevers/chills/night sweats, positive for weight gain Eyes: no blurry vision, diplopia, or amaurosis ENT: no sore throat or hearing loss Resp: no cough, wheezing, or hemoptysis CV: See history of present illness GI: no abdominal pain, nausea, vomiting, diarrhea, or constipation GU: no dysuria, frequency, or hematuria Skin: no rash Neuro: no headache, numbness, tingling, or weakness of extremities Musculoskeletal: Positive for joint pains Heme: no bleeding, DVT, or easy bruising Endo: no polydipsia or polyuria  BP 138/76  Pulse 91  Ht 5\' 5"  (1.651 m)  Wt 131.725 kg (290 lb 6.4 oz)  BMI 48.33 kg/m2  PHYSICAL EXAM: Pt is alert  and oriented, pleasant morbidly obese woman in no acute distress HEENT: normal Neck: JVP normal. Carotid upstrokes normal without bruits. No thyromegaly. Lungs: equal expansion, clear bilaterally CV: Apex is discrete and nondisplaced, RRR without murmur or gallop Abd: soft, NT, +BS, no bruit, no hepatosplenomegaly Back: no CVA tenderness Ext: 1+ edema below the knees bilaterally        Femoral pulses 2+= without bruits        DP/PT pulses intact and = Skin: warm and dry without rash Neuro: CNII-XII intact             Strength intact = bilaterally  EKG:  Normal sinus rhythm 91 beats per minute, age-indeterminate anterolateral infarction   ASSESSMENT AND PLAN: 1. Exertional chest tightness. The patient has cardiovascular risk factors of hyperlipidemia, hypertension, and family history. Considering her risk profile and symptoms associated with exertion, I have recommended a Myoview stress scan. I suspect she will require a 2 day study considering her body habitus.  2. Shortness of breath. This is probably multifactorial. Will check an echocardiogram to evaluate for structural heart disease. If her cardiac studies are negative, I would attribute most of her symptoms to her morbid obesity.  3. Hypertension, essential. Blood pressure appears to be controlled on a combination of amlodipine, furosemide, and lisinopril  For followup I will plan on seeing her back in 6 months. She was counseled extensively about diet and exercise. She understands that significant weight loss will be the primary means of reducing long-term health problems.  Tonny Bollman 10/02/2012 5:34 PM

## 2012-10-10 ENCOUNTER — Ambulatory Visit (HOSPITAL_COMMUNITY): Payer: BC Managed Care – PPO | Attending: Cardiology | Admitting: Radiology

## 2012-10-10 VITALS — BP 136/78 | Ht 65.0 in | Wt 289.0 lb

## 2012-10-10 DIAGNOSIS — R0602 Shortness of breath: Secondary | ICD-10-CM

## 2012-10-10 DIAGNOSIS — I10 Essential (primary) hypertension: Secondary | ICD-10-CM | POA: Insufficient documentation

## 2012-10-10 DIAGNOSIS — R0789 Other chest pain: Secondary | ICD-10-CM

## 2012-10-10 DIAGNOSIS — R0609 Other forms of dyspnea: Secondary | ICD-10-CM | POA: Insufficient documentation

## 2012-10-10 DIAGNOSIS — R0989 Other specified symptoms and signs involving the circulatory and respiratory systems: Secondary | ICD-10-CM

## 2012-10-10 DIAGNOSIS — R072 Precordial pain: Secondary | ICD-10-CM

## 2012-10-10 DIAGNOSIS — R002 Palpitations: Secondary | ICD-10-CM | POA: Insufficient documentation

## 2012-10-10 MED ORDER — TECHNETIUM TC 99M SESTAMIBI GENERIC - CARDIOLITE
30.0000 | Freq: Once | INTRAVENOUS | Status: AC | PRN
  Administered 2012-10-10: 30 via INTRAVENOUS

## 2012-10-10 MED ORDER — REGADENOSON 0.4 MG/5ML IV SOLN
0.4000 mg | Freq: Once | INTRAVENOUS | Status: AC
  Administered 2012-10-10: 0.4 mg via INTRAVENOUS

## 2012-10-10 NOTE — Progress Notes (Signed)
[facility] SITE 3 NUCLEAR MED [address] [place], [place] [address] [phone]    Cardiology Nuclear Med Study  [name] is a 52 y.o. female     MRN : [id number]     DOB: [birth year]  Procedure Date: 10/10/2012  Nuclear Med Background Indication for Stress Test:  Evaluation for Ischemia History:  Abnormal EKG: prior ALWMI  chronic back pain Cardiac Risk Factors: Family History - CAD, Hypertension and Lipids  Symptoms:  Chest Pressure with Exertion, DOE, Fatigue and Palpitations   Nuclear Pre-Procedure Caffeine/Decaff Intake:  None NPO After: 7:30pm   Lungs:  clear O2 Sat: 96% on room air. IV 0.9% NS with Angio Cath:  22g  IV Site: R Antecubital  IV Started by:  Milana Na, EMT-P  Chest Size (in):  42 Cup Size: DD  Height: 5\' 5"  (1.651 m)  Weight:  289 lb (131.09 kg)  BMI:  Body mass index is 48.09 kg/(m^2). Tech Comments:  Rx this am except Lasix    Nuclear Med Study 1 or 2 day study: 2 day  Stress Test Type:  Eugenie Birks  Reading MD: Willa Rough, MD  Order Authorizing Provider:  M.Cooper MD  Resting Radionuclide: Technetium 35m Sestamibi  Resting Radionuclide Dose: 33.0 mCi on 10/11/12   Stress Radionuclide:  Technetium 13m Sestamibi  Stress Radionuclide Dose: 33.0 mCi on 10/10/12           Stress Protocol Rest HR: 80 Stress HR: 120  Rest BP: 136/78 Stress BP: 155/95  Exercise Time (min): n/a METS: n/a   Predicted Max HR: 171 bpm % Max HR: 70.18 bpm Rate Pressure Product: 21308   Dose of Adenosine (mg):  n/a Dose of Lexiscan: 0.4 mg  Dose of Atropine (mg): n/a Dose of Dobutamine: n/a mcg/kg/min (at max HR)  Stress Test Technologist: Milana Na, EMT-P  Nuclear Technologist:  Domenic Polite, CNMT     Rest Procedure:  Myocardial perfusion imaging was performed at rest 45 minutes following the intravenous administration of Technetium 22m Sestamibi. Rest ECG: NSR, cannot R/O prior anterior MI, nonspecific ST changes.  Stress  Procedure:  The patient received IV Lexiscan 0.4 mg over 15-seconds.  Technetium 36m Sestamibi injected at 30-seconds. This patient had weakness, sob, chest tightness, and a rare pvc with the Lexiscan infusion. Quantitative spect images were obtained after a 45 minute delay. Stress ECG: No significant ST segment change suggestive of ischemia.  QPS Raw Data Images:  Acquisition technically good; normal left ventricular size. Stress Images:  There is decreased uptake in the anterior wall. Rest Images:  There is decreased uptake in the anterior wall. Subtraction (SDS):  No evidence of ischemia. Transient Ischemic Dilatation (Normal <1.22):  1.10 Lung/Heart Ratio (Normal <0.45):  0.24  Quantitative Gated Spect Images QGS EDV:  92 ml QGS ESV:  26 ml  Impression Exercise Capacity:  Lexiscan with no exercise. BP Response:  Normal blood pressure response. Clinical Symptoms:  There is chest pain and dyspnea. ECG Impression:  No significant ST segment change suggestive of ischemia. Comparison with Prior Nuclear Study: No images to compare  Overall Impression:  Normal stress nuclear study with a small, mild, fixed anterior defect consistent with soft tissue attenuation but no ischemia.  LV Ejection Fraction: 72%.  LV Wall Motion:  NL LV Function; NL Wall Motion  Olga Millers

## 2012-10-11 ENCOUNTER — Ambulatory Visit (HOSPITAL_BASED_OUTPATIENT_CLINIC_OR_DEPARTMENT_OTHER): Payer: BC Managed Care – PPO

## 2012-10-11 ENCOUNTER — Ambulatory Visit (HOSPITAL_COMMUNITY): Payer: BC Managed Care – PPO | Attending: Cardiology | Admitting: Radiology

## 2012-10-11 DIAGNOSIS — R072 Precordial pain: Secondary | ICD-10-CM

## 2012-10-11 DIAGNOSIS — R5381 Other malaise: Secondary | ICD-10-CM | POA: Insufficient documentation

## 2012-10-11 DIAGNOSIS — R0609 Other forms of dyspnea: Secondary | ICD-10-CM | POA: Insufficient documentation

## 2012-10-11 DIAGNOSIS — R609 Edema, unspecified: Secondary | ICD-10-CM | POA: Insufficient documentation

## 2012-10-11 DIAGNOSIS — R0989 Other specified symptoms and signs involving the circulatory and respiratory systems: Secondary | ICD-10-CM

## 2012-10-11 DIAGNOSIS — R0789 Other chest pain: Secondary | ICD-10-CM | POA: Insufficient documentation

## 2012-10-11 DIAGNOSIS — Z8249 Family history of ischemic heart disease and other diseases of the circulatory system: Secondary | ICD-10-CM | POA: Insufficient documentation

## 2012-10-11 DIAGNOSIS — I519 Heart disease, unspecified: Secondary | ICD-10-CM | POA: Insufficient documentation

## 2012-10-11 DIAGNOSIS — R0602 Shortness of breath: Secondary | ICD-10-CM

## 2012-10-11 MED ORDER — TECHNETIUM TC 99M SESTAMIBI GENERIC - CARDIOLITE
33.0000 | Freq: Once | INTRAVENOUS | Status: AC | PRN
  Administered 2012-10-11: 33 via INTRAVENOUS

## 2012-10-11 NOTE — Progress Notes (Signed)
Echocardiogram performed.  

## 2012-10-15 ENCOUNTER — Telehealth: Payer: Self-pay | Admitting: Cardiovascular Disease

## 2012-10-15 NOTE — Telephone Encounter (Signed)
Pt would like results of stress test 

## 2012-10-15 NOTE — Telephone Encounter (Signed)
Spoke with patient who called [place] wanting stress test results.  Patient informed that Dr. Jens Som had not signed off on report yet and we would call back when he did.  Patient asked to please be called on her cell:  [phone].

## 2012-10-15 NOTE — Telephone Encounter (Signed)
Report in chart; Dr Earmon Phoenix pt. [name]

## 2012-10-17 NOTE — Telephone Encounter (Signed)
Pt aware of results 

## 2012-10-26 ENCOUNTER — Encounter: Payer: Self-pay | Admitting: Family Medicine

## 2012-10-26 ENCOUNTER — Ambulatory Visit (INDEPENDENT_AMBULATORY_CARE_PROVIDER_SITE_OTHER): Payer: BC Managed Care – PPO | Admitting: Family Medicine

## 2012-10-26 VITALS — BP 150/88 | HR 88 | Temp 98.7°F | Resp 12 | Ht 63.75 in | Wt 294.0 lb

## 2012-10-26 DIAGNOSIS — M25569 Pain in unspecified knee: Secondary | ICD-10-CM

## 2012-10-26 DIAGNOSIS — F329 Major depressive disorder, single episode, unspecified: Secondary | ICD-10-CM

## 2012-10-26 DIAGNOSIS — F3289 Other specified depressive episodes: Secondary | ICD-10-CM

## 2012-10-26 DIAGNOSIS — F5104 Psychophysiologic insomnia: Secondary | ICD-10-CM

## 2012-10-26 DIAGNOSIS — M1990 Unspecified osteoarthritis, unspecified site: Secondary | ICD-10-CM

## 2012-10-26 DIAGNOSIS — K219 Gastro-esophageal reflux disease without esophagitis: Secondary | ICD-10-CM

## 2012-10-26 DIAGNOSIS — J4520 Mild intermittent asthma, uncomplicated: Secondary | ICD-10-CM | POA: Insufficient documentation

## 2012-10-26 DIAGNOSIS — Z860100 Personal history of colon polyps, unspecified: Secondary | ICD-10-CM

## 2012-10-26 DIAGNOSIS — J45909 Unspecified asthma, uncomplicated: Secondary | ICD-10-CM

## 2012-10-26 DIAGNOSIS — F32A Depression, unspecified: Secondary | ICD-10-CM

## 2012-10-26 DIAGNOSIS — G4700 Insomnia, unspecified: Secondary | ICD-10-CM

## 2012-10-26 DIAGNOSIS — M25562 Pain in left knee: Secondary | ICD-10-CM

## 2012-10-26 DIAGNOSIS — G43909 Migraine, unspecified, not intractable, without status migrainosus: Secondary | ICD-10-CM

## 2012-10-26 DIAGNOSIS — I10 Essential (primary) hypertension: Secondary | ICD-10-CM

## 2012-10-26 NOTE — Progress Notes (Signed)
Subjective:    Patient ID: [name], female    DOB: 1960/12/23, 52 y.o.   MRN: [id number]  HPI Patient seen to establish care She has multiple chronic problems including history of mild intermittent asthma, reported osteoarthritis involving knees and back, history of depression, migraine headaches, history of benign colon polyps, GERD, hypertension, and hyperlipidemia. Had recent dyspnea evaluation per cardiology with nuclear stress test and echocardiogram reportedly normal.  Previous surgeries as indicated elsewhere. Medications are reviewed.  Chronic insomnia issues. Currently taking Ambien and alprazolam. She does consume a lot of caffeine sometimes even at night. No alcohol use.  She's had some progressive left knee pains. Does not recall any prior x-rays. No recent injury. Poorly localized with no locking or giving way.    Family history as recorded elsewhere. She has a brother who has metastatic colon cancer. Patient had colonoscopy most recently March 2013 with recommended three-year followup  Past Medical History  Diagnosis Date  . Chronic back pain   . Anxiety   . Depression   . Hypertension   . High cholesterol   . Migraines   . GERD (gastroesophageal reflux disease)   . Colon polyps   . Asthma   . Trigger finger of both hands 2007    thumb   Past Surgical History  Procedure Laterality Date  . Back surgery    . Endometrial ablation w/ novasure    . Carpal tunnel release  2007    both hands    reports that she has never smoked. She does not have any smokeless tobacco history on file. She reports that she does not drink alcohol or use illicit drugs. family history includes Alcohol abuse in her mother; Arthritis in her mother; Cancer in her brother, daughter, and maternal grandmother; Heart disease (age of onset: 71) in her father; Hyperlipidemia in her father; and Hypertension in her father and sister. Allergies  Allergen Reactions  . Ceclor (Cefaclor)    . Venlafaxine     "refuses to take"      Review of Systems  Constitutional: Negative for appetite change and unexpected weight change.  Respiratory: Negative for cough.   Cardiovascular: Negative for chest pain, palpitations and leg swelling.  Gastrointestinal: Negative for abdominal pain.  Endocrine: Negative for polyuria.  Genitourinary: Negative for dysuria.  Musculoskeletal: Positive for arthralgias.  Skin: Negative for rash.  Neurological: Negative for weakness.  Psychiatric/Behavioral: Negative for dysphoric mood.       Objective:   Physical Exam  Constitutional: She appears well-developed and well-nourished.  Neck: Neck supple. No thyromegaly present.  Cardiovascular: Normal rate and regular rhythm.   Pulmonary/Chest: Effort normal and breath sounds normal. No respiratory distress. She has no wheezes. She has no rales.  Musculoskeletal: She exhibits no edema.  Patient has mild crepitus with flexion and extension  Lymphadenopathy:    She has no cervical adenopathy.  Psychiatric: She has a normal mood and affect.          Assessment & Plan:  #1 hypertension. Not well controlled by todays reading. Patient encouraged to get home cuff and close monitoring. Reassess 4 months. Lose some weight. #2 history of hyperlipidemia currently on pravastatin. Send for old records  #3 history of depression stable on high-dose sertraline #4 history of mild intermittent asthma. Takes albuterol as needed #5 history of migraine headaches  #6 history of benign colon polyps #7 GERD controlled with over-the-counter medications #8 history of chronic insomnia. Sleep hygiene discussed. Scale back caffeine use.  #  9 left knee pain. Probable osteoarthritis. Obtain baseline knee x-ray

## 2012-10-26 NOTE — Patient Instructions (Signed)

## 2012-10-27 DIAGNOSIS — F5104 Psychophysiologic insomnia: Secondary | ICD-10-CM

## 2012-10-27 HISTORY — DX: Psychophysiologic insomnia: F51.04

## 2012-11-13 ENCOUNTER — Encounter: Payer: Self-pay | Admitting: Family Medicine

## 2012-11-13 ENCOUNTER — Ambulatory Visit (INDEPENDENT_AMBULATORY_CARE_PROVIDER_SITE_OTHER): Payer: BC Managed Care – PPO | Admitting: Family Medicine

## 2012-11-13 VITALS — BP 138/88 | HR 126 | Temp 98.5°F | Wt 297.0 lb

## 2012-11-13 DIAGNOSIS — G43119 Migraine with aura, intractable, without status migrainosus: Secondary | ICD-10-CM

## 2012-11-13 DIAGNOSIS — J309 Allergic rhinitis, unspecified: Secondary | ICD-10-CM

## 2012-11-13 DIAGNOSIS — J329 Chronic sinusitis, unspecified: Secondary | ICD-10-CM

## 2012-11-13 DIAGNOSIS — G43819 Other migraine, intractable, without status migrainosus: Secondary | ICD-10-CM

## 2012-11-13 MED ORDER — KETOROLAC TROMETHAMINE 60 MG/2ML IM SOLN
30.0000 mg | Freq: Once | INTRAMUSCULAR | Status: AC
  Administered 2012-11-13: 30 mg via INTRAMUSCULAR

## 2012-11-13 MED ORDER — DOXYCYCLINE HYCLATE 100 MG PO TABS: 100.0000 mg | ORAL_TABLET | Freq: Two times a day (BID) | ORAL | Status: DC

## 2012-11-13 MED ORDER — PROMETHAZINE HCL 50 MG/ML IJ SOLN
12.5000 mg | INTRAMUSCULAR | Status: AC
  Administered 2012-11-13: 12.5 mg via INTRAMUSCULAR

## 2012-11-13 NOTE — Progress Notes (Signed)
Chief Complaint  Patient presents with  . severe headache    nausea and light sensitivy     HPI:  52 yo F pt of Dr. Lucie Leather with PMH migraines and anxiety here for acute visit for a headache: -history of migraines -she has had a headache on and off for 2 weeks -has been bilateral and in frontal region - has a little light sensitivity and a little nausea -has had some sneezing with allergies, cough, green nasal congestion with occ nose bleed, L max sinus pain for 4 days - has hx of asthma and has taken allegra in the past -reports motrin resolves headache but then it comes back -she has been under increased stress as brother fighting cancer -she has a history of frequent migraines - she tried imitrex in the past but doesn't like the way it feels so did not take it -was on venlafaxine in the past for prophylaxis - but doesn't like this medication as had to stop it for surgery and did not like the withdrawal from this -migraines had seemed to improve for a while, but now worse again -has history of recurrent sinus infection, hx of migraines requiring injection in doctor's office many times - but can't remember what  ROS: See pertinent positives and negatives per HPI.  Past Medical History  Diagnosis Date  . Chronic back pain   . Anxiety   . Depression   . Hypertension   . High cholesterol   . Migraines   . GERD (gastroesophageal reflux disease)   . Colon polyps   . Asthma   . Trigger finger of both hands 2007    thumb    Family History  Problem Relation Age of Onset  . Alcohol abuse Mother   . Arthritis Mother   . Hyperlipidemia Father   . Hypertension Father   . Heart disease Father 6    CABG  . Hypertension Sister   . Cancer Brother     colon, lung, liver  . Cancer Maternal Grandmother     breast  . Cancer Daughter     NEUROBLASTOMA    History   Social History  . Marital Status: Married    Spouse Name: N/A    Number of Children: N/A  . Years of  Education: N/A   Social History Main Topics  . Smoking status: Never Smoker   . Smokeless tobacco: None  . Alcohol Use: No  . Drug Use: No  . Sexually Active: None   Other Topics Concern  . None   Social History Narrative  . None    Current outpatient prescriptions:ALPRAZolam (XANAX) 0.5 MG tablet, Take 1 mg by mouth at bedtime. , Disp: , Rfl: ;  amLODipine (NORVASC) 10 MG tablet, Take 10 mg by mouth daily., Disp: , Rfl: ;  Cholecalciferol (VITAMIN D) 2000 UNITS CAPS, Take 1 capsule by mouth every evening., Disp: , Rfl: ;  Coenzyme Q10 (CO Q-10) 200 MG CAPS, Take by mouth., Disp: , Rfl: ;  furosemide (LASIX) 20 MG tablet, Take 20 mg by mouth daily., Disp: , Rfl:  lisinopril (PRINIVIL,ZESTRIL) 20 MG tablet, Take 20 mg by mouth daily., Disp: , Rfl: ;  Melatonin 10 MG CAPS, Take 20 mg by mouth at bedtime., Disp: , Rfl: ;  pravastatin (PRAVACHOL) 40 MG tablet, Take 40 mg by mouth every evening., Disp: , Rfl: ;  promethazine (PHENERGAN) 25 MG tablet, Take 25 mg by mouth every 6 (six) hours as needed. For nausea, Disp: , Rfl:  sertraline (ZOLOFT) 100 MG tablet, Take 100 mg by mouth. 1 tab in AM, 1 tab in PM, Disp: , Rfl: ;  SUMAtriptan (IMITREX) 100 MG tablet, Take 100 mg by mouth every 2 (two) hours as needed. For migraines, Disp: , Rfl: ;  zolpidem (AMBIEN CR) 12.5 MG CR tablet, Take 12.5 mg by mouth at bedtime., Disp: , Rfl:   EXAM:  Filed Vitals:   11/13/12 1053  BP: 138/88  Pulse: 126  Temp: 98.5 F (36.9 C)  BP: 138/88  Body mass index is 51.4 kg/(m^2).  GENERAL: vitals reviewed and listed above, alert, oriented, appears well hydrated and in no acute distress  HEENT: atraumatic, PERRLA, conjunttiva clear, no obvious abnormalities on inspection of external nose and ears, normal appearance of ear canals and TMs, white and green nasal congestion, boggy enlarged turbinates, ant septal irritation with dried blood, mild post oropharyngeal erythema with PND and cobblestoning, no tonsillar  edema or exudate, L max  sinus TTP  NECK: no obvious masses on inspection  LUNGS: clear to auscultation bilaterally, no wheezes, rales or rhonchi, good air movement  CV: HRRR, no peripheral edema  MS: moves all extremities without noticeable abnormality  NEURO: normal gait, CN II-XII grossly intact, finger to nose normal  PSYCH: pleasant and cooperative, no obvious depression or anxiety  ASSESSMENT AND PLAN:  Discussed the following assessment and plan:  Sinusitis  Allergic rhinitis  Migraine variant, intractable  -pt with hx chronic allergy issues, recurrent sinusitis in the past and intractable migraines she reports often required shot in doctor's office. She has not tolerated triptans and not interested in prophylactic meds for migraines. -I feels she has several issues: -sinusitis - will tx with doxy - risks discussed -uncontrolled AR - advised daily claritin  -intractable migraine - discussed options, she wants shot today (toradol and phenergan given after discussion risks) she does not want to try prophylactic medication -we will see how she does with tx sinusitis and allergies, stress management, topical menthol, tylenol for rare migraines, HA journal -follow up with PCP in 3-4 weeks - if persistent HAs - may need further discussion prophylactic treatments, neurology referral (offered today but she wants to wait on this) -Patient advised to return or notify a doctor immediately if symptoms worsen or persist or new concerns arise.  There are no Patient Instructions on file for this visit.   Kriste Basque R.

## 2012-11-13 NOTE — Addendum Note (Signed)
Addended by: Azucena Freed on: 11/13/2012 11:33 AM   Modules accepted: Orders

## 2012-11-16 ENCOUNTER — Encounter: Payer: Self-pay | Admitting: Family Medicine

## 2012-11-16 ENCOUNTER — Ambulatory Visit (INDEPENDENT_AMBULATORY_CARE_PROVIDER_SITE_OTHER): Payer: BC Managed Care – PPO | Admitting: Family Medicine

## 2012-11-16 ENCOUNTER — Telehealth: Payer: Self-pay | Admitting: Family Medicine

## 2012-11-16 VITALS — BP 100/62 | Temp 98.9°F

## 2012-11-16 DIAGNOSIS — J45909 Unspecified asthma, uncomplicated: Secondary | ICD-10-CM

## 2012-11-16 MED ORDER — PREDNISONE 10 MG PO TABS: ORAL_TABLET | ORAL | Status: DC

## 2012-11-16 NOTE — Telephone Encounter (Signed)
Patient Information:  Caller Name: Edmund  Phone: [phone]  Patient: [name], [name]  Gender: Female  DOB: August 13, 1960  Age: 52 Years  PCP: Evelena Peat (Family Practice)  Pregnant: No  Office Follow Up:  Does the office need to follow up with this patient?: No  Instructions For The Office: N/A  RN Note:  Treated for sinus infection, but cough has worsened over past 48 hours and now interferes with sleep.  New onset wheeze is present.  Per cough protocol, advised office visit; appt given  1400 11/16/12 with Dr. Caryl Never.  krs/can  Symptoms  Reason For Call & Symptoms: cough; seen in office 11/13/12 for migraine and given antibiotic for sinus infection.  Reviewed Health History In EMR: Yes  Reviewed Medications In EMR: Yes  Reviewed Allergies In EMR: Yes  Reviewed Surgeries / Procedures: Yes  Date of Onset of Symptoms: 11/14/2012 OB / GYN:  LMP: Unknown  Guideline(s) Used:  Cough  Disposition Per Guideline:   Go to Office Now  Reason For Disposition Reached:   Wheezing is present  Advice Given:  N/A  Patient Will Follow Care Advice:  YES  Appointment Scheduled:  11/16/2012 14:30:00 Appointment Scheduled Provider:  Evelena Peat (Family Practice)

## 2012-11-16 NOTE — Progress Notes (Signed)
  Subjective:    Patient ID: [name], female    DOB: 10/28/1960, 52 y.o.   MRN: [id number]  HPI Acute visit. Patient seen earlier in the week with possible sinusitis. Started on doxycycline. Seen today with cough and intermittent wheezing. She has history of mild intermittent asthma. Nonsmoker. No definite fevers. Cough mostly nonproductive. Uses albuterol for rescue inhaler.  Past Medical History  Diagnosis Date  . Chronic back pain   . Anxiety   . Depression   . Hypertension   . High cholesterol   . Migraines   . GERD (gastroesophageal reflux disease)   . Colon polyps   . Asthma   . Trigger finger of both hands 2007    thumb   Past Surgical History  Procedure Laterality Date  . Back surgery    . Endometrial ablation w/ novasure    . Carpal tunnel release  2007    both hands    reports that she has never smoked. She does not have any smokeless tobacco history on file. She reports that she does not drink alcohol or use illicit drugs. family history includes Alcohol abuse in her mother; Arthritis in her mother; Cancer in her brother, daughter, and maternal grandmother; Heart disease (age of onset: 68) in her father; Hyperlipidemia in her father; and Hypertension in her father and sister. Allergies  Allergen Reactions  . Ceclor (Cefaclor)   . Venlafaxine     "refuses to take"      Review of Systems  Constitutional: Negative for fever and chills.  HENT: Negative for congestion and sinus pressure.   Respiratory: Positive for cough and wheezing.   Cardiovascular: Negative for chest pain.       Objective:   Physical Exam  Constitutional: She appears well-developed and well-nourished.  HENT:  Right Ear: External ear normal.  Left Ear: External ear normal.  Mouth/Throat: Oropharynx is clear and moist.  Neck: Neck supple.  Cardiovascular: Normal rate and regular rhythm.   Pulmonary/Chest: Effort normal and breath sounds normal. No respiratory distress.  She has no wheezes. She has no rales.          Assessment & Plan:  Cough. Differential is allergic postnasal drip related versus viral. She is describing some wheezing though none noted at this time. Prednisone taper prescribed. Finish out doxycycline.

## 2012-11-19 ENCOUNTER — Telehealth: Payer: Self-pay | Admitting: Family Medicine

## 2012-11-19 MED ORDER — HYDROCODONE-HOMATROPINE 5-1.5 MG/5ML PO SYRP: 5.0000 mL | ORAL_SOLUTION | Freq: Three times a day (TID) | ORAL | Status: DC | PRN

## 2012-11-19 NOTE — Telephone Encounter (Signed)
Pt following up on cough med request.  Pharmacy: [facility] Pt will call back in the am with fax number for that work note. Pt works for the [employer] and they require a note. Pt says thank you!

## 2012-11-19 NOTE — Telephone Encounter (Signed)
Patient Information:  Caller Name: Corrie Dandy  Phone: [phone]  Patient: [name], [name]  Gender: Female  DOB: 1960-10-14  Age: 52 Years  PCP: Evelena Peat [facility])  Pregnant: No  Office Follow Up:  Does the office need to follow up with this patient?: Yes  Instructions For The Office: OFFICE, PT IS REQUESTING MEDICATION FOR HER COUGH.  PT HAS BEEN IN TWICE LAST WEEK FOR SYMPTOMS.  PT REPORTS SHE IS STILL WHEEZING.  RN OFFERED APPT BUT PT WAS WANTING TO SEE IF MEDICATIONS COULD BE CALLED IN WITHOUT AN OFFICE VISIT.  PT IS ALSO REQUESTING A NOTE FOR MISSING WORK TODAY.  PLEASE FOLLOW UP WITH PT.  RN Note:  coughing up light green mucous (not every time)  Symptoms  Reason For Call & Symptoms: pt was seen in the office on 11/13/12 and 11/16/12 for same symptoms.  Pt states that her wheezing is worse and she has a hard time taking a deep breath without coughing  Reviewed Health History In EMR: Yes  Reviewed Medications In EMR: Yes  Reviewed Allergies In EMR: Yes  Reviewed Surgeries / Procedures: Yes  Date of Onset of Symptoms: 11/13/2012  Treatments Tried: Doxycycline, prednisone  Treatments Tried Worked: No OB / GYN:  LMP: Unknown  Guideline(s) Used:  Cough  Disposition Per Guideline:   Go to Office Now  Reason For Disposition Reached:   Wheezing is present  Advice Given:  N/A  Patient Refused Recommendation:  Patient Requests Prescription  pt is requesting medication to be called in for her cough.  Pt is also requesting a doctors note for missing work today

## 2012-11-19 NOTE — Telephone Encounter (Signed)
She is already on prednisone. May call in Hycodan cough syrup one tsp po q6 hours prn cough 120 ml with no refill Work note OK.

## 2012-11-19 NOTE — Telephone Encounter (Signed)
[facility] Triage Call Report Triage Record Num: [id number] Operator: Levora Angel Patient Name: [name] Call Date & Time: 11/18/2012 12:40:43PM Patient Phone: [phone] PCP: Evelena Peat Patient Gender: Female PCP Fax : [phone] Patient DOB: 09-05-1960 [facility] Name: [facility] Reason for Call: Caller: [name]/Patient; PCP: Evelena Peat (Family Practice); CB#: [phone]; Call regarding Cough/Congestion; Afebrile Onset: 11/16/12 patient states that she has a rattle in her chest and is requesting something for the cough. Coughing up yellow green, brownish sputum. Urgent symtptom of "Productive cough with colored sputum" per Cough protocol. Patient is requesting medication to help with cough. Home care advice given of honey and warm fluids. Patient instructed to call office in the morning to inquire about cough medicine or reevaluation. If symptoms worsen and cannot wait until in the morning instructed to go to urgent care [facility] for evaluation. Protocol(s) Used: Cough - Adult Recommended Outcome per Protocol: See Provider within 24 hours Reason for Outcome: Productive cough with colored sputum (other than clear or white sputum) Care Advice: Drink more fluids -- water, low-sugar juices, tea and warm soup, especially chicken broth, are options. Avoid caffeinated or alcoholic beverages because they can increase the chance of dehydration. ~ 04/

## 2012-11-19 NOTE — Telephone Encounter (Signed)
Cough syrup called in, letter written, pt informed on VM.  Just waiting for pt to call with work fax number for work note.

## 2012-11-21 NOTE — Telephone Encounter (Signed)
Letter faxed, confirmation received.

## 2012-11-21 NOTE — Telephone Encounter (Signed)
Fax for work note:  [phone]

## 2012-11-23 ENCOUNTER — Telehealth: Payer: Self-pay | Admitting: Family Medicine

## 2012-11-23 ENCOUNTER — Encounter: Payer: Self-pay | Admitting: Family

## 2012-11-23 ENCOUNTER — Ambulatory Visit (INDEPENDENT_AMBULATORY_CARE_PROVIDER_SITE_OTHER): Payer: BC Managed Care – PPO | Admitting: Family

## 2012-11-23 ENCOUNTER — Ambulatory Visit (INDEPENDENT_AMBULATORY_CARE_PROVIDER_SITE_OTHER)
Admission: RE | Admit: 2012-11-23 | Discharge: 2012-11-23 | Disposition: A | Discharge status: Home / Self Care | Payer: BC Managed Care – PPO | Source: Ambulatory Visit | Attending: Family Medicine | Admitting: Family Medicine

## 2012-11-23 VITALS — BP 122/64 | HR 111 | Temp 97.6°F

## 2012-11-23 DIAGNOSIS — R062 Wheezing: Secondary | ICD-10-CM

## 2012-11-23 DIAGNOSIS — J45909 Unspecified asthma, uncomplicated: Secondary | ICD-10-CM

## 2012-11-23 DIAGNOSIS — J309 Allergic rhinitis, unspecified: Secondary | ICD-10-CM

## 2012-11-23 DIAGNOSIS — J4530 Mild persistent asthma, uncomplicated: Secondary | ICD-10-CM

## 2012-11-23 DIAGNOSIS — M1990 Unspecified osteoarthritis, unspecified site: Secondary | ICD-10-CM

## 2012-11-23 MED ORDER — MONTELUKAST SODIUM 10 MG PO TABS: 10.0000 mg | ORAL_TABLET | Freq: Every day | ORAL | Status: DC

## 2012-11-23 NOTE — Telephone Encounter (Signed)
Noted, pt has an appt today

## 2012-11-23 NOTE — Patient Instructions (Signed)

## 2012-11-23 NOTE — Telephone Encounter (Signed)
Patient Information:  Caller Name: Corrie Dandy  Phone: [phone]  Patient: [name], [name]  Gender: Female  DOB: 1961-03-26  Age: 52 Years  PCP: Evelena Peat (Family Practice)  Pregnant: No  Office Follow Up:  Does the office need to follow up with this patient?: No  Instructions For The Office: N/A   Symptoms  Reason For Call & Symptoms: seen in the 11/13/12  and 11/16/12 for sinusitis and  asthmatic bronchitis.  Pt received new cough medication on 11/19/12.  Pt states she is still wheezing and rattling and is unable to sleep  Reviewed Health History In EMR: Yes  Reviewed Medications In EMR: Yes  Reviewed Allergies In EMR: Yes  Reviewed Surgeries / Procedures: Yes  Date of Onset of Symptoms: 11/13/2012  Treatments Tried: doxycycline, prednisone, hycodan  Treatments Tried Worked: No OB / GYN:  LMP: Unknown  Guideline(s) Used:  Asthma Attack  Disposition Per Guideline:   See Today or Tomorrow in Office  Reason For Disposition Reached:   Intermittent mild wheezing persists > 5 days  Advice Given:  N/A  Patient Will Follow Care Advice:  YES  Appointment Scheduled:  11/23/2012 09:45:00 Appointment Scheduled Provider:  Adline Mango [facility])  (pt is working and said it would take her 15 minutes to get to the office)

## 2012-11-23 NOTE — Progress Notes (Signed)
Subjective:    Patient ID: [name], female    DOB: 06/11/1961, 52 y.o.   MRN: [id number]  HPI 52 year old female, nonsmoker, patient of Dr. Caryl Never is in today with persistent cough, shortness of breath, and intermittent wheezing has been ongoing x2 weeks. She was seen by Dr. Caryl Never and was prescribed a prednisone taper and albuterol inhaler. Initially, the medication appeared to be working but is no longer helping. She has a history of mild persistent asthma. Has been using help her albuterol inhaler that helps temporarily.   Review of Systems  Constitutional: Negative.   HENT: Positive for congestion and postnasal drip.   Eyes: Negative.   Respiratory: Positive for cough, shortness of breath and wheezing.   Cardiovascular: Negative.  Negative for chest pain and palpitations.  Skin: Negative.   Allergic/Immunologic: Positive for environmental allergies. Negative for food allergies and immunocompromised state.  Psychiatric/Behavioral: Negative.    Past Medical History  Diagnosis Date  . Chronic back pain   . Anxiety   . Depression   . Hypertension   . High cholesterol   . Migraines   . GERD (gastroesophageal reflux disease)   . Colon polyps   . Asthma   . Trigger finger of both hands 2007    thumb    History   Social History  . Marital Status: Married    Spouse Name: N/A    Number of Children: N/A  . Years of Education: N/A   Occupational History  . Not on file.   Social History Main Topics  . Smoking status: Never Smoker   . Smokeless tobacco: Not on file  . Alcohol Use: No  . Drug Use: No  . Sexually Active: Not on file   Other Topics Concern  . Not on file   Social History Narrative  . No narrative on file    Past Surgical History  Procedure Laterality Date  . Back surgery    . Endometrial ablation w/ novasure    . Carpal tunnel release  2007    both hands    Family History  Problem Relation Age of Onset  . Alcohol abuse Mother    . Arthritis Mother   . Hyperlipidemia Father   . Hypertension Father   . Heart disease Father 39    CABG  . Hypertension Sister   . Cancer Brother     colon, lung, liver  . Cancer Maternal Grandmother     breast  . Cancer Daughter     NEUROBLASTOMA    Allergies  Allergen Reactions  . Ceclor (Cefaclor)   . Venlafaxine     "refuses to take"    Current Outpatient Prescriptions on File Prior to Visit  Medication Sig Dispense Refill  . ALPRAZolam (XANAX) 0.5 MG tablet Take 1 mg by mouth at bedtime.       Marland Kitchen amLODipine (NORVASC) 10 MG tablet Take 10 mg by mouth daily.      . Cholecalciferol (VITAMIN D) 2000 UNITS CAPS Take 1 capsule by mouth every evening.      . Coenzyme Q10 (CO Q-10) 200 MG CAPS Take by mouth.      . doxycycline (VIBRA-TABS) 100 MG tablet Take 1 tablet (100 mg total) by mouth 2 (two) times daily.  20 tablet  0  . furosemide (LASIX) 20 MG tablet Take 20 mg by mouth daily.      Marland Kitchen HYDROcodone-homatropine (HYCODAN) 5-1.5 MG/5ML syrup Take 5 mLs by mouth every 8 (eight) hours as  needed for cough.  120 mL  0  . lisinopril (PRINIVIL,ZESTRIL) 20 MG tablet Take 20 mg by mouth daily.      . Melatonin 10 MG CAPS Take 20 mg by mouth at bedtime.      . pravastatin (PRAVACHOL) 40 MG tablet Take 40 mg by mouth every evening.      . predniSONE (DELTASONE) 10 MG tablet 6-6-4-4-3-3-2-2-1-1  32 tablet  0  . promethazine (PHENERGAN) 25 MG tablet Take 25 mg by mouth every 6 (six) hours as needed. For nausea      . sertraline (ZOLOFT) 100 MG tablet Take 100 mg by mouth. 1 tab in AM, 1 tab in PM      . SUMAtriptan (IMITREX) 100 MG tablet Take 100 mg by mouth every 2 (two) hours as needed. For migraines      . zolpidem (AMBIEN CR) 12.5 MG CR tablet Take 12.5 mg by mouth at bedtime.       No current facility-administered medications on file prior to visit.    BP 122/64  Pulse 111  Temp(Src) 97.6 F (36.4 C) (Oral)  SpO2 94%chart    Objective:   Physical Exam  Constitutional:  She is oriented to person, place, and time. She appears well-developed and well-nourished.  HENT:  Right Ear: External ear normal.  Left Ear: External ear normal.  Nose: Nose normal.  Mouth/Throat: Oropharynx is clear and moist.  Neck: Normal range of motion. Neck supple.  Cardiovascular: Normal rate, regular rhythm and normal heart sounds.   Pulmonary/Chest: Effort normal. She has no wheezes.  Breath sounds are clear, but coarseness noted throughout  Neurological: She is alert and oriented to person, place, and time.  Skin: Skin is warm and dry.  Psychiatric: She has a normal mood and affect.          Assessment & Plan:  Assessment: 1. Shortness of breath 2. Mild Persistent Asthma 3. Allergic Rhinitis  Plan: Start Symbicort 80/4.52 puffs twice a day. Start Singulair 10 mg once daily in lieu of Zyrtec. Patient call the office symptoms worsen or persist. Recheck a schedule, and as needed. Patient sent for chest x-ray.

## 2012-11-26 ENCOUNTER — Encounter: Payer: Self-pay | Admitting: Family

## 2012-11-26 ENCOUNTER — Other Ambulatory Visit: Payer: Self-pay | Admitting: Family

## 2012-11-26 MED ORDER — HYDROCODONE-HOMATROPINE 5-1.5 MG/5ML PO SYRP: 5.0000 mL | ORAL_SOLUTION | Freq: Three times a day (TID) | ORAL | Status: DC | PRN

## 2012-11-27 ENCOUNTER — Encounter: Payer: Self-pay | Admitting: Family

## 2012-11-27 ENCOUNTER — Telehealth: Payer: Self-pay | Admitting: Family Medicine

## 2012-11-27 NOTE — Telephone Encounter (Signed)
Done by Bland Span

## 2012-11-27 NOTE — Telephone Encounter (Signed)
PT called in and stated that the HYDROcodone-homatropine [facility]) 5-1.5 MG/5ML is still not at her pharmacy. It looks like [facility] yesterday, but as of this morning, they have no record of it. Please assist.

## 2012-11-27 NOTE — Progress Notes (Signed)
Quick Note:  Pt informed on personally identified VM at work ______

## 2012-11-29 ENCOUNTER — Other Ambulatory Visit: Payer: Self-pay | Admitting: Nurse Practitioner

## 2012-12-05 ENCOUNTER — Telehealth: Payer: Self-pay | Admitting: *Deleted

## 2012-12-05 ENCOUNTER — Encounter: Payer: Self-pay | Admitting: Family

## 2012-12-05 DIAGNOSIS — M25562 Pain in left knee: Secondary | ICD-10-CM

## 2012-12-05 NOTE — Telephone Encounter (Signed)
I recommend going ahead with orthopedic consult.  We can see if she has preference.

## 2012-12-05 NOTE — Telephone Encounter (Signed)
Pt sent a My Chart message reporting her left knee pain is getting worse.  X-Ray showed osteoarthritis.  Pt reports taking 4 motrin at a time for the pain, and not controlling it, it even woke her out of sleep hurting.  Pt asking if she needs to be seen again, also requesting referral to ortho if Dr Caryl Never thinks that would be the next step.

## 2012-12-06 MED ORDER — HYDROCODONE-ACETAMINOPHEN 5-300 MG PO TABS: ORAL_TABLET | ORAL | Status: DC

## 2012-12-06 NOTE — Telephone Encounter (Signed)
We could use limited Vicodin 5/325 mg 1-2 every 6 hours prn pain #30 with no refill.

## 2012-12-10 ENCOUNTER — Encounter: Payer: Self-pay | Admitting: Family Medicine

## 2012-12-10 NOTE — Telephone Encounter (Signed)
Refill Symbicort 80mg  2 puffs bid

## 2012-12-11 MED ORDER — BUDESONIDE-FORMOTEROL FUMARATE 160-4.5 MCG/ACT IN AERO: 2.0000 | INHALATION_SPRAY | Freq: Two times a day (BID) | RESPIRATORY_TRACT | Status: DC

## 2012-12-13 ENCOUNTER — Other Ambulatory Visit: Payer: Self-pay | Admitting: Family Medicine

## 2012-12-14 ENCOUNTER — Telehealth: Payer: Self-pay | Admitting: *Deleted

## 2012-12-14 NOTE — Telephone Encounter (Signed)
Patient is requesting a refill of Hydrocodone 5/300 1-2 tabs every 6 hours as needed for pain.  This prescription was filled 12/06/12 and patient was to go to orthopedics for knee pain.  Should this prescription be denied?

## 2012-12-15 NOTE — Telephone Encounter (Signed)
This Rx has been denied.  Patient should be treated by ortho.

## 2012-12-21 ENCOUNTER — Ambulatory Visit: Payer: Self-pay | Admitting: Family

## 2013-01-02 ENCOUNTER — Other Ambulatory Visit: Payer: Self-pay | Admitting: *Deleted

## 2013-01-02 MED ORDER — FUROSEMIDE 20 MG PO TABS: 20.0000 mg | ORAL_TABLET | Freq: Every day | ORAL | Status: DC

## 2013-01-28 ENCOUNTER — Ambulatory Visit (INDEPENDENT_AMBULATORY_CARE_PROVIDER_SITE_OTHER): Payer: BC Managed Care – PPO | Admitting: Family Medicine

## 2013-01-28 ENCOUNTER — Encounter: Payer: Self-pay | Admitting: Family Medicine

## 2013-01-28 VITALS — BP 122/74 | Temp 98.7°F

## 2013-01-28 DIAGNOSIS — R059 Cough, unspecified: Secondary | ICD-10-CM

## 2013-01-28 DIAGNOSIS — R05 Cough: Secondary | ICD-10-CM

## 2013-01-28 DIAGNOSIS — J069 Acute upper respiratory infection, unspecified: Secondary | ICD-10-CM

## 2013-01-28 MED ORDER — ALBUTEROL SULFATE HFA 108 (90 BASE) MCG/ACT IN AERS: 2.0000 | INHALATION_SPRAY | Freq: Four times a day (QID) | RESPIRATORY_TRACT | Status: DC | PRN

## 2013-01-28 MED ORDER — AZITHROMYCIN 250 MG PO TABS: ORAL_TABLET | ORAL | Status: AC

## 2013-01-28 NOTE — Patient Instructions (Addendum)

## 2013-01-28 NOTE — Progress Notes (Signed)
  Subjective:    Patient ID: [name], female    DOB: 26-Mar-1961, 52 y.o.   MRN: [id number]  HPI Acute visit Patient seen with 4 day history of some facial pain, headaches and cough productive of green sputum. No fever. Mild sore throat. She has history of asthma and is concerned about exacerbation. She needs refills of her albuterol inhaler. Denies any nausea, vomiting, diarrhea, or any sick contacts.   Review of Systems  Constitutional: Positive for fatigue. Negative for fever and chills.  HENT: Positive for congestion and sinus pressure.   Respiratory: Positive for cough.   Neurological: Positive for headaches.       Objective:   Physical Exam  Constitutional: She appears well-developed and well-nourished.  HENT:  Right Ear: External ear normal.  Left Ear: External ear normal.  Mouth/Throat: Oropharynx is clear and moist.  Neck: Neck supple.  Cardiovascular: Normal rate and regular rhythm.   Pulmonary/Chest: Effort normal and breath sounds normal. No respiratory distress. She has no wheezes. She has no rales.  Lymphadenopathy:    She has no cervical adenopathy.          Assessment & Plan:  Probable viral syndrome. Reassurance. No antibiotics recommend at this time but consider Zithromax if she has persistent facial pain or worsening symptoms of the next few days. Refill Provera for as needed use

## 2013-02-11 ENCOUNTER — Ambulatory Visit (INDEPENDENT_AMBULATORY_CARE_PROVIDER_SITE_OTHER): Payer: BC Managed Care – PPO | Admitting: Family Medicine

## 2013-02-11 ENCOUNTER — Telehealth: Payer: Self-pay | Admitting: Family Medicine

## 2013-02-11 ENCOUNTER — Encounter: Payer: Self-pay | Admitting: Family Medicine

## 2013-02-11 VITALS — BP 128/80 | HR 105 | Temp 98.4°F | Wt 314.0 lb

## 2013-02-11 DIAGNOSIS — M79604 Pain in right leg: Secondary | ICD-10-CM

## 2013-02-11 DIAGNOSIS — M545 Low back pain, unspecified: Secondary | ICD-10-CM

## 2013-02-11 MED ORDER — METHOCARBAMOL 500 MG PO TABS: 500.0000 mg | ORAL_TABLET | Freq: Four times a day (QID) | ORAL | Status: DC

## 2013-02-11 NOTE — Patient Instructions (Addendum)
Try heat or ice for symptom relief. Follow up for any worsening pain, weakness, or progressive lower extremity numbness.

## 2013-02-11 NOTE — Telephone Encounter (Signed)
Patient Information:  Caller Name: Arista Kettlewell  Phone: [phone]  Patient: [name], Patient  Gender: Female  DOB: 1960/12/18  Age: 52 Years  PCP: Evelena Peat [facility])  Pregnant: No  Office Follow Up:  Does the office need to follow up with this patient?: No  Instructions For The Office: N/A   Symptoms  Reason For Call & Symptoms: DJD and surgery in 2012 and she has 6 screws and rods in lower back. Back is aching and  having spasms with sitting on R side.  Pain starts in lower back and radiates down to R knee. She can walk and stand but is trying to rest. She is needing a note for work. She has tried taking 800 mg of Ibuprofen and helps some but still having significant pain.   Reviewed Health History In EMR: Yes  Reviewed Medications In EMR: Yes  Reviewed Allergies In EMR: Yes  Reviewed Surgeries / Procedures: Yes  Date of Onset of Symptoms: 02/08/2013  Treatments Tried: keeping back warm,  Treatments Tried Worked: No OB / GYN:  LMP: Unknown  Guideline(s) Used:  Back Pain  Disposition Per Guideline:   See Today or Tomorrow in Office  Reason For Disposition Reached:   Pain radiates into the thigh or further down the leg  Advice Given:  Cold or Heat:  Cold Pack: For pain or swelling, use a cold pack or ice wrapped in a wet cloth. Put it on the sore area for 20 minutes. Repeat 4 times on the first day, then as needed.  Heat Pack: If pain lasts over 2 days, apply heat to the sore area. Use a heat pack, heating pad, or warm wet washcloth. Do this for 10 minutes, then as needed. For widespread stiffness, take a hot bath or hot shower instead. Move the sore area under the warm water.  Sleep:  Sleep on your side with a pillow between your knees. If you sleep on your back, put a pillow under your knees.  Avoid sleeping on your stomach.  Your mattress should be firm. Avoid waterbeds.  Activity  Keep doing your day-to-day activities if it is not too painful.  Staying active is better than resting.  Avoid anything that makes your pain worse. Avoid heavy lifting, twisting, and too much exercise until your back heals.  Pain Medicines:  For pain relief, take acetaminophen, ibuprofen, or naproxen.  Use the lowest amount of medicine that makes your pain feel better.  Call Back If:  Numbness or weakness occur  Bowel/bladder problems occur  Pain lasts for more than 2 weeks  You become worse.  Patient Will Follow Care Advice:  YES  Appointment Scheduled:  02/11/2013 13:45:00 Appointment Scheduled Provider:  Evelena Peat [facility])

## 2013-02-11 NOTE — Progress Notes (Signed)
  Subjective:    Patient ID: [name], female    DOB: [birth year], 52 y.o.   MRN: [id number]  HPI Low back pain and sensation of muscle spasm intermittently past few days Lumbar surgery back in 2012 for disc herniation and lumbar fixation. Pain radiates to RLE occasionally. Ibuprofen without relief.  Symptoms are relatively constant.   No urine or stool incontinence.  Past Medical History  Diagnosis Date  . Chronic back pain   . Anxiety   . Depression   . Hypertension   . High cholesterol   . Migraines   . GERD (gastroesophageal reflux disease)   . Colon polyps   . Asthma   . Trigger finger of both hands 2007    thumb   Past Surgical History  Procedure Laterality Date  . Back surgery    . Endometrial ablation w/ novasure    . Carpal tunnel release  2007    both hands    reports that she has never smoked. She does not have any smokeless tobacco history on file. She reports that she does not drink alcohol or use illicit drugs. family history includes Alcohol abuse in her mother; Arthritis in her mother; Cancer in her brother, daughter, and maternal grandmother; Heart disease (age of onset: 32) in her father; Hyperlipidemia in her father; and Hypertension in her father and sister. Allergies  Allergen Reactions  . Ceclor (Cefaclor)   . Venlafaxine     "refuses to take"      Review of Systems  Constitutional: Negative for fever, chills, appetite change and unexpected weight change.  Respiratory: Negative for shortness of breath.   Cardiovascular: Negative for chest pain.  Gastrointestinal: Negative for abdominal pain.  Genitourinary: Negative for dysuria.  Musculoskeletal: Positive for back pain.       Objective:   Physical Exam  Constitutional: She appears well-developed and well-nourished.  Cardiovascular: Normal rate.   Pulmonary/Chest: Effort normal and breath sounds normal. No respiratory distress. She has no wheezes. She has no rales.   Musculoskeletal:  Straight leg raises are negative No edema   Neurological:  No focal strength deficits.   DTRs trace right ankle and 1+ left.          Assessment & Plan:  Low back pain/spasm.  nonfocal neuro exam. Surgery as above.  Add Robaxin 500 mg po q 6 hours prn and continue with Motrin prn.

## 2013-02-13 ENCOUNTER — Encounter: Payer: Self-pay | Admitting: Family Medicine

## 2013-02-13 ENCOUNTER — Other Ambulatory Visit: Payer: Self-pay | Admitting: Nurse Practitioner

## 2013-02-15 ENCOUNTER — Other Ambulatory Visit: Payer: Self-pay

## 2013-02-15 MED ORDER — AMLODIPINE BESYLATE 10 MG PO TABS: 10.0000 mg | ORAL_TABLET | Freq: Every day | ORAL | Status: DC

## 2013-02-18 ENCOUNTER — Telehealth: Payer: Self-pay | Admitting: Family Medicine

## 2013-02-18 NOTE — Telephone Encounter (Signed)
Patient sent My Chart message on 7/23 but has not heard back. Message: "I was in to see you on 02-11-2013 and you prescribed Methocarbamol 500 MG. I have taken it at or around bed time and it makes me feel really nauseated off and on all night. Never enough to actually get sick. Will this yuckiness go away or do you mind if I try something else? [facility] [place]. Thank you!"  Patient states methocarbamol is not working. Please advise re new medication.

## 2013-02-19 MED ORDER — METAXALONE 800 MG PO TABS: 800.0000 mg | ORAL_TABLET | Freq: Three times a day (TID) | ORAL | Status: DC | PRN

## 2013-02-19 NOTE — Telephone Encounter (Signed)
RX sent to pharmacy  

## 2013-02-19 NOTE — Telephone Encounter (Signed)
I have not seen this message until today.  Stop Methocarbamol and try Skelaxin 800 mg po q 8 hours prn muscle spasm. #30 with no refills.

## 2013-02-25 ENCOUNTER — Ambulatory Visit (INDEPENDENT_AMBULATORY_CARE_PROVIDER_SITE_OTHER): Payer: BC Managed Care – PPO | Admitting: Family Medicine

## 2013-02-25 ENCOUNTER — Encounter: Payer: Self-pay | Admitting: Family Medicine

## 2013-02-25 VITALS — BP 136/86 | HR 104 | Temp 98.4°F | Wt 314.0 lb

## 2013-02-25 DIAGNOSIS — M545 Low back pain, unspecified: Secondary | ICD-10-CM

## 2013-02-25 DIAGNOSIS — Z23 Encounter for immunization: Secondary | ICD-10-CM

## 2013-02-25 NOTE — Progress Notes (Signed)
  Subjective:    Patient ID: [name], female    DOB: 1960-09-01, 52 y.o.   MRN: [id number]  HPI Followup regarding her back pain. Previous extensive back surgery. Refer to prior note. We added Robaxin and she had side effects. Subsequently tried Skelaxin which she tolerated. Back pain is improved at this time. No radiculopathy symptoms. No weakness. No lower extremity numbness.  Patient cannot confirm prior tetanus. She is fairly certain this is over 10 years ago. She has relatives who have infant since she has questions regarding Tdap.  No contraindications.  Past Medical History  Diagnosis Date  . Chronic back pain   . Anxiety   . Depression   . Hypertension   . High cholesterol   . Migraines   . GERD (gastroesophageal reflux disease)   . Colon polyps   . Asthma   . Trigger finger of both hands 2007    thumb   Past Surgical History  Procedure Laterality Date  . Back surgery    . Endometrial ablation w/ novasure    . Carpal tunnel release  2007    both hands    reports that she has never smoked. She does not have any smokeless tobacco history on file. She reports that she does not drink alcohol or use illicit drugs. family history includes Alcohol abuse in her mother; Arthritis in her mother; Cancer in her brother, daughter, and maternal grandmother; Heart disease (age of onset: 17) in her father; Hyperlipidemia in her father; and Hypertension in her father and sister. Allergies  Allergen Reactions  . Ceclor (Cefaclor)   . Venlafaxine     "refuses to take"      Review of Systems  Constitutional: Negative for fever and chills.  Genitourinary: Negative for dysuria.  Musculoskeletal: Positive for back pain.  Neurological: Negative for weakness and numbness.       Objective:   Physical Exam  Constitutional: She appears well-developed and well-nourished.  Cardiovascular: Normal rate and regular rhythm.   Pulmonary/Chest: Effort normal and breath sounds  normal. No respiratory distress. She has no wheezes. She has no rales.  Musculoskeletal: She exhibits no edema.  Straight leg raise are negative  Neurological:  Symmetric reflexes knee and ankle bilaterally. No focal strength deficits          Assessment & Plan:  Chronic low back pain with recent flare. Symptomatically improved with Skelaxin and anti-inflammatories. Continue observation. We discussed issues of prevention with recommendations for weight loss and consider core strengthening such as Pilates  Health maintenance. Tetanus booster (Tdap) given-especially with her frequent care of infants

## 2013-03-04 ENCOUNTER — Ambulatory Visit (INDEPENDENT_AMBULATORY_CARE_PROVIDER_SITE_OTHER): Payer: BC Managed Care – PPO | Admitting: Family Medicine

## 2013-03-04 ENCOUNTER — Encounter: Payer: Self-pay | Admitting: Family Medicine

## 2013-03-04 ENCOUNTER — Telehealth: Payer: Self-pay | Admitting: Family Medicine

## 2013-03-04 VITALS — BP 140/88 | HR 102 | Temp 98.4°F | Wt 313.0 lb

## 2013-03-04 DIAGNOSIS — G44209 Tension-type headache, unspecified, not intractable: Secondary | ICD-10-CM

## 2013-03-04 MED ORDER — HYDROCODONE-ACETAMINOPHEN 5-325 MG PO TABS: ORAL_TABLET | ORAL | Status: DC

## 2013-03-04 NOTE — Telephone Encounter (Signed)
Patient Information:  Caller Name: [name]  Phone: [phone]  Patient: [name], [name]  Gender: Female  DOB: 04/20/1961  Age: 52 Years  PCP: Evelena Peat [facility])  Pregnant: No  Office Follow Up:  Does the office need to follow up with this patient?: Yes  Instructions For The Office: Please call back to advise if MD agrees patient should be seen and if OK to wait until 1545 appointment.  RN Note:  Uterine ablation. Called to ask for something stronger to be called in than otc NSAIDS.  Due to "severe headache" triggered by upsetting news about brother with cancer and feeling depressed, advised to see MD within 4 hours per RN judgement; scheduled for next open appointment with Dr Caryl Never at [phone] 03/04/13. Please call back to advise if MD agrees that  she should be seen and if OK to wait until 1545.    Symptoms  Reason For Call & Symptoms: Emergent Call: Severe headache.   Current head pain rated 6-7/10 but she reports it is severe.  Head pain located on top of head.  Headache does not feel like a migraine.  Related onset of headache since 1030 03/03/13 during visit with brother diagnosed cancer and related stressful conversation. Depressed.  Reviewed Health History In EMR: Yes  Reviewed Medications In EMR: Yes  Reviewed Allergies In EMR: Yes  Reviewed Surgeries / Procedures: Yes  Date of Onset of Symptoms: 03/03/2013  Treatments Tried: Tylenol, Motrin, Muscle relaxer  Treatments Tried Worked: No OB / GYN:  LMP: Unknown  Guideline(s) Used:  Headache  Disposition Per Guideline:   Callback by PCP or Subspecialist within 1 Hour  Reason For Disposition Reached:   Severe headache and has had severe headaches before  Advice Given:  N/A  RN Overrode Recommendation:  Make Appointment  RN judgement  Appointment Scheduled:  03/04/2013 15:45:00 Appointment Scheduled Provider:  Evelena Peat (Family Practice)

## 2013-03-04 NOTE — Progress Notes (Signed)
  Subjective:    Patient ID: [name], female    DOB: 1961/05/17, 52 y.o.   MRN: [id number]  HPI Acute visit Patient seen with headache which started past Saturday. Bandlike distribution bilateral. Dull quality. Moderate severity. Mild nausea but no vomiting. No photophobia. No fevers or chills. No sinus congestion symptoms. Patient tried Tylenol, Motrin, and muscle relaxer without improvement. This headache is not typical for migraines. She is dealing with stress of her brother with terminal cancer.  Past Medical History  Diagnosis Date  . Chronic back pain   . Anxiety   . Depression   . Hypertension   . High cholesterol   . Migraines   . GERD (gastroesophageal reflux disease)   . Colon polyps   . Asthma   . Trigger finger of both hands 2007    thumb   Past Surgical History  Procedure Laterality Date  . Back surgery    . Endometrial ablation w/ novasure    . Carpal tunnel release  2007    both hands    reports that she has never smoked. She does not have any smokeless tobacco history on file. She reports that she does not drink alcohol or use illicit drugs. family history includes Alcohol abuse in her mother; Arthritis in her mother; Cancer in her brother, daughter, and maternal grandmother; Heart disease (age of onset: 66) in her father; Hyperlipidemia in her father; and Hypertension in her father and sister. Allergies  Allergen Reactions  . Ceclor (Cefaclor)   . Venlafaxine     "refuses to take"      Review of Systems  Constitutional: Negative for fever and chills.  HENT: Negative for neck pain.   Eyes: Negative for visual disturbance.  Cardiovascular: Negative for chest pain.  Neurological: Positive for headaches. Negative for dizziness, syncope and weakness.       Objective:   Physical Exam  Constitutional: She is oriented to person, place, and time. She appears well-developed and well-nourished.  HENT:  Right Ear: External ear normal.  Left Ear:  External ear normal.  Mouth/Throat: Oropharynx is clear and moist.  Eyes: Pupils are equal, round, and reactive to light.  Neck: Neck supple. No thyromegaly present.  Cardiovascular: Normal rate and regular rhythm.   Pulmonary/Chest: Effort normal and breath sounds normal. No respiratory distress. She has no wheezes. She has no rales.  Neurological: She is alert and oriented to person, place, and time. No cranial nerve deficit.          Assessment & Plan:  Acute tension type headache. Warm compresses to neck and upper back. Limited hydrocodone 5 mg one to 2 every 4-6 hours for severe pain-she has not responded to OTC meds.  Prevention discussed.

## 2013-03-04 NOTE — Patient Instructions (Addendum)

## 2013-03-04 NOTE — Telephone Encounter (Signed)
Patient is aware okay to wait

## 2013-03-13 ENCOUNTER — Telehealth: Payer: Self-pay | Admitting: Family Medicine

## 2013-03-13 ENCOUNTER — Other Ambulatory Visit: Payer: Self-pay | Admitting: Family

## 2013-03-13 DIAGNOSIS — E7800 Pure hypercholesterolemia, unspecified: Secondary | ICD-10-CM

## 2013-03-13 DIAGNOSIS — I10 Essential (primary) hypertension: Secondary | ICD-10-CM

## 2013-03-13 NOTE — Telephone Encounter (Signed)
Pt would like a referral top a nutritionalist for assistance w/ weight loss. Pt has gained 40-50 lbs in the past year. Pt has had no luck finding one on her own, and some require a referral. Pls advise.

## 2013-03-13 NOTE — Telephone Encounter (Signed)
Pt is aware that the referral has been made

## 2013-03-13 NOTE — Telephone Encounter (Signed)
i have made referral.  Let pt know. 

## 2013-03-29 ENCOUNTER — Other Ambulatory Visit: Payer: Self-pay | Admitting: Family Medicine

## 2013-03-29 NOTE — Telephone Encounter (Signed)
Refill once 

## 2013-03-29 NOTE — Telephone Encounter (Signed)
Last refill 03/04/13 #20 no refill

## 2013-04-03 ENCOUNTER — Ambulatory Visit: Payer: BC Managed Care – PPO | Admitting: *Deleted

## 2013-04-04 ENCOUNTER — Telehealth: Payer: Self-pay | Admitting: *Deleted

## 2013-04-04 MED ORDER — BUDESONIDE-FORMOTEROL FUMARATE 160-4.5 MCG/ACT IN AERO: 2.0000 | INHALATION_SPRAY | Freq: Two times a day (BID) | RESPIRATORY_TRACT | Status: DC

## 2013-04-04 NOTE — Telephone Encounter (Signed)
This is on the list.  Ok to refill for one year.

## 2013-04-04 NOTE — Telephone Encounter (Signed)
Patient is requesting a Rx refill for Symbicort.  I do not see this on the medication list.  Is this okay to fill?

## 2013-04-29 ENCOUNTER — Encounter: Payer: Self-pay | Admitting: Family Medicine

## 2013-04-29 ENCOUNTER — Ambulatory Visit (INDEPENDENT_AMBULATORY_CARE_PROVIDER_SITE_OTHER): Payer: BC Managed Care – PPO | Admitting: Family Medicine

## 2013-04-29 VITALS — BP 124/80 | HR 108 | Temp 98.1°F | Wt 309.0 lb

## 2013-04-29 DIAGNOSIS — J209 Acute bronchitis, unspecified: Secondary | ICD-10-CM

## 2013-04-29 MED ORDER — HYDROCODONE-HOMATROPINE 5-1.5 MG/5ML PO SYRP: 5.0000 mL | ORAL_SOLUTION | Freq: Four times a day (QID) | ORAL | Status: DC | PRN

## 2013-04-29 NOTE — Progress Notes (Signed)
  Subjective:    Patient ID: [name], female    DOB: 1960-12-20, 52 y.o.   MRN: [id number]  HPI The patient is seen with upper respiratory infection Onset about 3 days ago. She has bilateral ear pain, nasal congestion, malaise, intermittent headaches and cough productive of green sputum. No fevers or chills. Her cough been most bothersome. Question of some wheezing off and on. Using albuterol as needed and takes Symbicort regularly. No dyspnea. Denies nausea or vomiting.  Past Medical History  Diagnosis Date  . Chronic back pain   . Anxiety   . Depression   . Hypertension   . High cholesterol   . Migraines   . GERD (gastroesophageal reflux disease)   . Colon polyps   . Asthma   . Trigger finger of both hands 2007    thumb   Past Surgical History  Procedure Laterality Date  . Back surgery    . Endometrial ablation w/ novasure    . Carpal tunnel release  2007    both hands    reports that she has never smoked. She does not have any smokeless tobacco history on file. She reports that she does not drink alcohol or use illicit drugs. family history includes Alcohol abuse in her mother; Arthritis in her mother; Cancer in her brother, daughter, and maternal grandmother; Heart disease (age of onset: 61) in her father; Hyperlipidemia in her father; Hypertension in her father and sister. Allergies  Allergen Reactions  . Ceclor [Cefaclor]   . Venlafaxine     "refuses to take"       Review of Systems  Constitutional: Positive for fatigue. Negative for fever and chills.  HENT: Positive for congestion, sore throat and sinus pressure.   Respiratory: Positive for cough.   Neurological: Positive for headaches.       Objective:   Physical Exam  Constitutional: She appears well-developed and well-nourished.  HENT:  Right Ear: External ear normal.  Left Ear: External ear normal.  Nose: Nose normal.  Mouth/Throat: Oropharynx is clear and moist.  Neck: Neck supple.   Cardiovascular: Normal rate.   Pulmonary/Chest: Effort normal and breath sounds normal. No respiratory distress. She has no wheezes. She has no rales.  Lymphadenopathy:    She has no cervical adenopathy.          Assessment & Plan:  Viral URI/acute bronchitis. Suspect viral. Limited Hycodan cough syrup 1 teaspoon each bedtime when necessary severe cough. Followup promptly for fever or worsening symptoms

## 2013-04-29 NOTE — Patient Instructions (Addendum)

## 2013-05-01 ENCOUNTER — Telehealth: Payer: Self-pay | Admitting: Family Medicine

## 2013-05-01 MED ORDER — BENZONATATE 200 MG PO CAPS: ORAL_CAPSULE | ORAL | Status: DC

## 2013-05-01 NOTE — Telephone Encounter (Signed)
Pt aware.

## 2013-05-01 NOTE — Telephone Encounter (Signed)
Pt wants to know if she can get a work note for today.

## 2013-05-01 NOTE — Telephone Encounter (Signed)
Work note OK. 

## 2013-05-01 NOTE — Telephone Encounter (Signed)
Patient Information:  Caller Name: Corrie Dandy  Phone: [phone]  Patient: [name], [name]  Gender: Female  DOB: 1961/07/17  Age: 52 Years  PCP: Evelena Peat [facility])  Pregnant: No  Office Follow Up:  Does the office need to follow up with this patient?: Yes  Instructions For The Office: Please advise  RN Note:  Please advise  Symptoms  Reason For Call & Symptoms: Pt calling regarding f/u to visit on 04/29/13. States she is not feeling any better and actually a bit worse. Afebrile now but feels like she has been running fever over night. Does not feel like she can go to work today. Pt asking if she could get Tessalon pearls, a note for work and possiblyan antibiotic. Advised pt should be seen in office. Pt states she feels too bad to drive from [place] and asks if Rx can be called in since she was in on Monday.  Reviewed Health History In EMR: Yes  Reviewed Medications In EMR: Yes  Reviewed Allergies In EMR: Yes  Reviewed Surgeries / Procedures: Yes  Date of Onset of Symptoms: 04/29/2013  Treatments Tried: Hycodan, Inhalers  Treatments Tried Worked: No OB / GYN:  LMP: Unknown  Guideline(s) Used:  Cough  Disposition Per Guideline:   See Today in Office  Reason For Disposition Reached:   Severe coughing spells (e.g., whooping sound after coughing, vomiting after coughing)  Advice Given:  N/A  Patient Will Follow Care Advice:  YES

## 2013-05-01 NOTE — Telephone Encounter (Signed)
Tessalon perles 200 mg one every 8 hours prn cough #30 with 0 refill.  i suspect this is viral.  If she has fever will need to follow up.

## 2013-05-01 NOTE — Telephone Encounter (Signed)
Patient has been out of work Monday Tuesday and Wednesday. No fever

## 2013-05-02 ENCOUNTER — Telehealth: Payer: Self-pay | Admitting: Family Medicine

## 2013-05-02 NOTE — Telephone Encounter (Signed)
Should not be out of Hycodan yet.  Cannot take over recommended amount.  Work note OK.

## 2013-05-02 NOTE — Telephone Encounter (Signed)
Pt is informed, and will fax letter over to [phone] to her job.

## 2013-05-02 NOTE — Telephone Encounter (Signed)
Patient would like to have a  Refill on this medications: HYDROcodone-homatropine (HYCODAN) 5-1.5 MG/5ML syrup 120 mL 0 04/29/2013 05/09/2013 Take 5 mLs by mouth every 6 (six) hours as needed for cough. - Oral. Viral URI/acute bronchitis. Suspect viral. Limited Hycodan cough syrup 1 teaspoon each bedtime when necessary severe cough. Current symptoms are chest rattle, cough, ear/nasal pain and wheezing. Not any fever symptoms at this time. Patient needs work note up until 05/03/13 and needs to return 05/06/13.

## 2013-05-02 NOTE — Telephone Encounter (Signed)
Patient is making a appointment to come in to be seen. Still not feeling any better

## 2013-05-06 ENCOUNTER — Telehealth: Payer: Self-pay | Admitting: Family Medicine

## 2013-05-06 NOTE — Telephone Encounter (Signed)
Pt needs a note stating she was out of work one half day 10/6, then 10/7-10/10 due to her upper resp issue. Pt seen 10/06. pls fax to [phone]  [facility]

## 2013-05-06 NOTE — Telephone Encounter (Signed)
Already faxed letter is in chart.

## 2013-05-06 NOTE — Telephone Encounter (Signed)
Pt was seen on 04-29-13. Pt stated tessalon 200 mg did not work. Pt would like another refill on hycodan call [facility] if possible.

## 2013-05-06 NOTE — Telephone Encounter (Signed)
Refill once 

## 2013-05-07 MED ORDER — HYDROCODONE-HOMATROPINE 5-1.5 MG/5ML PO SYRP: 5.0000 mL | ORAL_SOLUTION | Freq: Four times a day (QID) | ORAL | Status: AC | PRN

## 2013-05-07 NOTE — Telephone Encounter (Signed)
Pt aware that RX will be at the front for pick up 

## 2013-05-11 ENCOUNTER — Other Ambulatory Visit: Payer: Self-pay | Admitting: Family Medicine

## 2013-05-13 ENCOUNTER — Telehealth: Payer: Self-pay | Admitting: Family Medicine

## 2013-05-13 NOTE — Telephone Encounter (Signed)
Hydrocodone(Norco)  Last visit 04/29/13 Last refill 03/29/13 #20 0 refill

## 2013-05-14 MED ORDER — HYDROCODONE-ACETAMINOPHEN 5-325 MG PO TABS: ORAL_TABLET | ORAL | Status: DC

## 2013-05-14 NOTE — Telephone Encounter (Signed)
I would avoid regular use.  If back pain is ongoing, I would recommend PT referral.

## 2013-05-14 NOTE — Telephone Encounter (Signed)
Rx is ready for pick up and pt was sent a message through [place].

## 2013-05-15 ENCOUNTER — Other Ambulatory Visit: Payer: Self-pay | Admitting: Family Medicine

## 2013-05-16 ENCOUNTER — Other Ambulatory Visit: Payer: Self-pay | Admitting: Family Medicine

## 2013-05-30 ENCOUNTER — Other Ambulatory Visit: Payer: Self-pay

## 2013-06-24 ENCOUNTER — Other Ambulatory Visit: Payer: Self-pay | Admitting: Family Medicine

## 2013-07-15 ENCOUNTER — Ambulatory Visit (INDEPENDENT_AMBULATORY_CARE_PROVIDER_SITE_OTHER): Payer: BC Managed Care – PPO | Admitting: Family Medicine

## 2013-07-15 ENCOUNTER — Encounter: Payer: Self-pay | Admitting: Family Medicine

## 2013-07-15 VITALS — BP 130/80 | HR 96 | Temp 98.0°F | Wt 312.0 lb

## 2013-07-15 DIAGNOSIS — J209 Acute bronchitis, unspecified: Secondary | ICD-10-CM

## 2013-07-15 MED ORDER — HYDROCODONE-HOMATROPINE 5-1.5 MG/5ML PO SYRP: 5.0000 mL | ORAL_SOLUTION | Freq: Four times a day (QID) | ORAL | Status: AC | PRN

## 2013-07-15 NOTE — Patient Instructions (Signed)
Acute Bronchitis Bronchitis is inflammation of the airways that extend from the windpipe into the lungs (bronchi). The inflammation often causes mucus to develop. This leads to a cough, which is the most common symptom of bronchitis.  In acute bronchitis, the condition usually develops suddenly and goes away over time, usually in a couple weeks. Smoking, allergies, and asthma can make bronchitis worse. Repeated episodes of bronchitis may cause further lung problems.  CAUSES Acute bronchitis is most often caused by the same virus that causes a cold. The virus can spread from person to person (contagious).  SIGNS AND SYMPTOMS   Cough.   Fever.   Coughing up mucus.   Body aches.   Chest congestion.   Chills.   Shortness of breath.   Sore throat.  DIAGNOSIS  Acute bronchitis is usually diagnosed through a physical exam. Tests, such as chest X-rays, are sometimes done to rule out other conditions.  TREATMENT  Acute bronchitis usually goes away in a couple weeks. Often times, no medical treatment is necessary. Medicines are sometimes given for relief of fever or cough. Antibiotics are usually not needed but may be prescribed in certain situations. In some cases, an inhaler may be recommended to help reduce shortness of breath and control the cough. A cool mist vaporizer may also be used to help thin bronchial secretions and make it easier to clear the chest.  HOME CARE INSTRUCTIONS  Get plenty of rest.   Drink enough fluids to keep your urine clear or pale yellow (unless you have a medical condition that requires fluid restriction). Increasing fluids may help thin your secretions and will prevent dehydration.   Only take over-the-counter or prescription medicines as directed by your health care provider.   Avoid smoking and secondhand smoke. Exposure to cigarette smoke or irritating chemicals will make bronchitis worse. If you are a smoker, consider using nicotine gum or skin  patches to help control withdrawal symptoms. Quitting smoking will help your lungs heal faster.   Reduce the chances of another bout of acute bronchitis by washing your hands frequently, avoiding people with cold symptoms, and trying not to touch your hands to your mouth, nose, or eyes.   Follow up with your health care provider as directed.  SEEK MEDICAL CARE IF: Your symptoms do not improve after 1 week of treatment.  SEEK IMMEDIATE MEDICAL CARE IF:  You develop an increased fever or chills.   You have chest pain.   You have severe shortness of breath.  You have bloody sputum.   You develop dehydration.  You develop fainting.  You develop repeated vomiting.  You develop a severe headache. MAKE SURE YOU:   Understand these instructions.  Will watch your condition.  Will get help right away if you are not doing well or get worse. Document Released: 08/18/2004 Document Revised: 03/13/2013 Document Reviewed: 01/01/2013 Exit[employer] Patient Information 2014 [employer].  

## 2013-07-15 NOTE — Progress Notes (Signed)
Pre visit review using our clinic review tool, if applicable. No additional management support is needed unless otherwise documented below in the visit note. 

## 2013-07-15 NOTE — Progress Notes (Signed)
   Subjective:    Patient ID: [name], female    DOB: 07/16/1961, 52 y.o.   MRN: [id number]  HPI Acute visit Five-day history of cough with productive sputum. No fever. No chills. Minimal nasal congestion. Some increased malaise. Denies any wheezing or significant dyspnea. Patient is a nonsmoker. She's had cough especially bothersome at night. Not relieved with over-the-counter medications.  No sick contacts. Minimal sore throat. Denies any nausea or vomiting. Occasional postnasal drip  Past Medical History  Diagnosis Date  . Chronic back pain   . Anxiety   . Depression   . Hypertension   . High cholesterol   . Migraines   . GERD (gastroesophageal reflux disease)   . Colon polyps   . Asthma   . Trigger finger of both hands 2007    thumb   Past Surgical History  Procedure Laterality Date  . Back surgery    . Endometrial ablation w/ novasure    . Carpal tunnel release  2007    both hands    reports that she has never smoked. She does not have any smokeless tobacco history on file. She reports that she does not drink alcohol or use illicit drugs. family history includes Alcohol abuse in her mother; Arthritis in her mother; Cancer in her brother, daughter, and maternal grandmother; Heart disease (age of onset: 80) in her father; Hyperlipidemia in her father; Hypertension in her father and sister. Allergies  Allergen Reactions  . Ceclor [Cefaclor]   . Venlafaxine     "refuses to take"      Review of Systems  Constitutional: Positive for fatigue. Negative for fever and chills.  HENT: Positive for congestion.   Respiratory: Positive for cough.   Cardiovascular: Negative for chest pain.       Objective:   Physical Exam  Constitutional: She appears well-developed and well-nourished.  HENT:  Right Ear: External ear normal.  Left Ear: External ear normal.  Mouth/Throat: Oropharynx is clear and moist.  Neck: Neck supple.  Cardiovascular: Normal rate.     Pulmonary/Chest: Effort normal and breath sounds normal. No respiratory distress. She has no wheezes. She has no rales.  Lymphadenopathy:    She has no cervical adenopathy.          Assessment & Plan:  Acute bronchitis. Likely viral. Reassurance. No indication for antibiotics at this time. Hycodan cough syrup 1 teaspoon each bedtime when necessary for severe cough

## 2013-07-19 ENCOUNTER — Telehealth: Payer: Self-pay | Admitting: Family Medicine

## 2013-07-19 MED ORDER — BENZONATATE 200 MG PO CAPS: ORAL_CAPSULE | ORAL | Status: DC

## 2013-07-19 NOTE — Telephone Encounter (Signed)
Last seen 07/15/2013 Last refill 07/15/2013

## 2013-07-19 NOTE — Telephone Encounter (Signed)
RX called into the pharmacy and pt is aware.

## 2013-07-19 NOTE — Telephone Encounter (Signed)
Pt would like another rx for hydrocodone cough syrup

## 2013-07-19 NOTE — Telephone Encounter (Signed)
I would avoid chronic use to avoid dependency.  Should not be out yet from Hycodan filled on 12-22.  I would try Tessalon Perles 200 mg po q 8 hours prn cough #30

## 2013-07-26 ENCOUNTER — Encounter: Payer: Self-pay | Admitting: Family Medicine

## 2013-07-29 ENCOUNTER — Encounter: Payer: Self-pay | Admitting: Family Medicine

## 2013-07-29 ENCOUNTER — Ambulatory Visit (INDEPENDENT_AMBULATORY_CARE_PROVIDER_SITE_OTHER): Payer: BC Managed Care – PPO | Admitting: Family Medicine

## 2013-07-29 VITALS — BP 124/78 | HR 126 | Temp 99.0°F | Wt 307.0 lb

## 2013-07-29 DIAGNOSIS — J209 Acute bronchitis, unspecified: Secondary | ICD-10-CM

## 2013-07-29 MED ORDER — AZITHROMYCIN 250 MG PO TABS: ORAL_TABLET | ORAL | Status: DC

## 2013-07-29 MED ORDER — HYDROCOD POLST-CHLORPHEN POLST 10-8 MG/5ML PO LQCR: 5.0000 mL | Freq: Two times a day (BID) | ORAL | Status: DC | PRN

## 2013-07-29 NOTE — Progress Notes (Signed)
   Subjective:    Patient ID: [name], female    DOB: 19-Dec-1960, 53 y.o.   MRN: [id number]  HPI Here for 2 weeks of chest tightness, fevers and coughing up green sputum. Using fluids and Tessalon Perles.    Review of Systems  Constitutional: Positive for fever.  HENT: Negative.   Eyes: Negative.   Respiratory: Positive for cough and chest tightness. Negative for shortness of breath and wheezing.        Objective:   Physical Exam  Constitutional: She appears well-developed and well-nourished.  HENT:  Right Ear: External ear normal.  Left Ear: External ear normal.  Nose: Nose normal.  Mouth/Throat: Oropharynx is clear and moist.  Eyes: Conjunctivae are normal.  Pulmonary/Chest: Effort normal. No respiratory distress. She has no wheezes. She has no rales.  Scattered rhonchi   Lymphadenopathy:    She has no cervical adenopathy.          Assessment & Plan:  Add Mucinex

## 2013-07-29 NOTE — Progress Notes (Signed)
Pre visit review using our clinic review tool, if applicable. No additional management support is needed unless otherwise documented below in the visit note. 

## 2013-07-31 ENCOUNTER — Telehealth: Payer: Self-pay | Admitting: Family Medicine

## 2013-07-31 NOTE — Telephone Encounter (Signed)
Patient Information:  Caller Name: [name]  Phone: [phone]  Patient: [name], [name]  Gender: Female  DOB: 12-Jun-1961  Age: 53 Years  PCP: Evelena PeatBurchette, [facility](Family Practice)  Pregnant: No  Office Follow Up:  Does the office need to follow up with this patient?: Yes  Instructions For The Office: See RN Note.  Requesting refill of Norco 5/325.  Please f/u with pt.  RN Note:  [name] states she devleoped a headache this morning.  Has hx of migraines.  Has not taken anything for pain at this time and wants to know what she can take.  Currently taking Tussionex for cough.  Advised Ibuprofen as directed on bottle.  States headache is moderate.  Also requesting refill of Norco 5/325.  Symptoms  Reason For Call & Symptoms: Headahce  Reviewed Health History In EMR: Yes  Reviewed Medications In EMR: Yes  Reviewed Allergies In EMR: Yes  Reviewed Surgeries / Procedures: Yes  Date of Onset of Symptoms: 07/31/2013 OB / GYN:  LMP: Unknown  Guideline([name]) Used:  Headache  Disposition Per Guideline:   Home Care  Reason For Disposition Reached:   Mild-moderate headache  Advice Given:  Call Back If:  Headache lasts longer than 24 hours  You become worse.  Patient Will Follow Care Advice:  YES

## 2013-07-31 NOTE — Telephone Encounter (Signed)
Last refill 05/14/13 #20 0

## 2013-07-31 NOTE — Telephone Encounter (Signed)
She cannot take Norco with tussionex as they both have hydrocodone and mixing could be dangerous and suppression respirations.  I advise she try NSAIDS such as Naproxen 500 mg po bid prn (#30 with no refill)  She could take for the back pain or headache but not regularly and take with food.

## 2013-08-01 ENCOUNTER — Other Ambulatory Visit: Payer: Self-pay

## 2013-08-01 MED ORDER — NAPROXEN 500 MG PO TABS: 500.0000 mg | ORAL_TABLET | Freq: Two times a day (BID) | ORAL | Status: DC | PRN

## 2013-08-01 NOTE — Telephone Encounter (Signed)
Pt informed and RX sent to pharmacy  

## 2013-08-05 ENCOUNTER — Ambulatory Visit: Payer: BC Managed Care – PPO | Admitting: Family Medicine

## 2013-08-07 ENCOUNTER — Telehealth: Payer: Self-pay | Admitting: Family Medicine

## 2013-08-07 NOTE — Telephone Encounter (Signed)
Letter faxed to [phone]

## 2013-08-07 NOTE — Telephone Encounter (Signed)
Pt lost her note for work and needs a copy of the one for dec 22 and 23. If we do hot have pt will need new one. pls fax to [phone]

## 2013-08-13 ENCOUNTER — Ambulatory Visit (INDEPENDENT_AMBULATORY_CARE_PROVIDER_SITE_OTHER): Payer: BC Managed Care – PPO | Admitting: Family Medicine

## 2013-08-13 ENCOUNTER — Encounter: Payer: Self-pay | Admitting: Family Medicine

## 2013-08-13 VITALS — BP 124/80 | HR 102 | Temp 98.3°F | Wt 301.0 lb

## 2013-08-13 DIAGNOSIS — G44209 Tension-type headache, unspecified, not intractable: Secondary | ICD-10-CM

## 2013-08-13 MED ORDER — HYDROCODONE-ACETAMINOPHEN 5-325 MG PO TABS: ORAL_TABLET | ORAL | Status: DC

## 2013-08-13 NOTE — Patient Instructions (Signed)

## 2013-08-13 NOTE — Progress Notes (Signed)
Pre visit review using our clinic review tool, if applicable. No additional management support is needed unless otherwise documented below in the visit note. 

## 2013-08-13 NOTE — Progress Notes (Signed)
   Subjective:    Patient ID: [name], female    DOB: 01/15/1961, 53 y.o.   MRN: [id number]  HPI Patient seen with chief complaint of headache. Onset about 3 days ago. Bilateral dull headache. No photophobia. No nausea or vomiting. Intensity is moderate. She states is different from her migraine headaches. She's had no fever. No sinus congestion. Took some naproxen which helped but did not alleviate her headache. No clear exacerbating factors. Denies any focal neurologic symptoms.  Past Medical History  Diagnosis Date  . Chronic back pain   . Anxiety   . Depression   . Hypertension   . High cholesterol   . Migraines   . GERD (gastroesophageal reflux disease)   . Colon polyps   . Asthma   . Trigger finger of both hands 2007    thumb   Past Surgical History  Procedure Laterality Date  . Back surgery    . Endometrial ablation w/ novasure    . Carpal tunnel release  2007    both hands    reports that she has never smoked. She has never used smokeless tobacco. She reports that she does not drink alcohol or use illicit drugs. family history includes Alcohol abuse in her mother; Arthritis in her mother; Cancer in her brother, daughter, and maternal grandmother; Heart disease (age of onset: 7975) in her father; Hyperlipidemia in her father; Hypertension in her father and sister. Allergies  Allergen Reactions  . Adhesive [Tape]     Peeled off skin  . Ceclor [Cefaclor]   . Venlafaxine     "refuses to take"      Review of Systems  Constitutional: Negative for fever and chills.  HENT: Negative for congestion.   Cardiovascular: Negative for chest pain.  Neurological: Positive for headaches. Negative for dizziness, seizures, syncope and weakness.  Psychiatric/Behavioral: Negative for confusion.       Objective:   Physical Exam  Constitutional: She is oriented to person, place, and time. She appears well-developed and well-nourished.  Eyes: Pupils are equal, round, and  reactive to light.  Neck: Neck supple. No thyromegaly present.  Cardiovascular: Normal rate.   Pulmonary/Chest: Effort normal and breath sounds normal. No respiratory distress. She has no wheezes. She has no rales.  Neurological: She is alert and oriented to person, place, and time. No cranial nerve deficit. Coordination normal.          Assessment & Plan:  Headache. Suspect acute tension type headache. continue naproxen. Discussed other conservative measures for relieving tension headache. Get adequate rest and stay well-hydrated. wrote Limited hydrocodone 5 mg #20 tablets 1-2 for severe headache if not relieved with naproxen.

## 2013-08-16 ENCOUNTER — Other Ambulatory Visit: Payer: Self-pay | Admitting: Nurse Practitioner

## 2013-08-29 ENCOUNTER — Telehealth: Payer: Self-pay | Admitting: Family Medicine

## 2013-08-30 ENCOUNTER — Telehealth: Payer: Self-pay

## 2013-08-30 MED ORDER — NAPROXEN 500 MG PO TABS: 500.0000 mg | ORAL_TABLET | Freq: Two times a day (BID) | ORAL | Status: DC | PRN

## 2013-08-30 MED ORDER — PROMETHAZINE HCL 25 MG PO TABS: 25.0000 mg | ORAL_TABLET | Freq: Four times a day (QID) | ORAL | Status: DC | PRN

## 2013-08-30 NOTE — Telephone Encounter (Signed)
Pt is requesting for her headaches. Pt informed to make appt to be seen by Dr. Caryl NeverBurchette

## 2013-08-30 NOTE — Telephone Encounter (Signed)
Pt also needs refill on promethazine [facility]

## 2013-08-30 NOTE — Telephone Encounter (Signed)
She should not be taking regularly.  Is she requesting for headaches?

## 2013-08-30 NOTE — Telephone Encounter (Signed)
Hydrocodone  Last visit 08/13/13 Last refill 08/13/13 #20 0  [facility]

## 2013-08-30 NOTE — Telephone Encounter (Signed)
RX sent to pharmacy  

## 2013-09-30 ENCOUNTER — Other Ambulatory Visit: Payer: Self-pay | Admitting: Family Medicine

## 2013-09-30 MED ORDER — HYDROCODONE-ACETAMINOPHEN 5-325 MG PO TABS: ORAL_TABLET | ORAL | Status: DC

## 2013-10-08 ENCOUNTER — Telehealth: Payer: Self-pay | Admitting: Family Medicine

## 2013-10-08 NOTE — Telephone Encounter (Signed)
Patient Information:  Caller Name: [name]  Phone: [phone]  Patient: [name], [name]  Gender: Female  DOB: 02-25-1961  Age: 53 Years  PCP: Evelena PeatBurchette, [facility](Family Practice)  Pregnant: No  Office Follow Up:  Does the office need to follow up with this patient?: Yes  Instructions For The Office: Patient requesting a note for work. Please review.  RN Note:  Patient is asking for a not for work. She works for the school system.  Asking for it to ready 'SHE DID NOT come to the office but did call"  Fax to [phone] attention [name].  Symptoms  Reason For Call & Symptoms: Patient states she had a 24 hour bug "only symptom was nausea".  She did not vomit or diarrhea.  Was awake last night and was tired this morning and called into work. They are requiring a "Doctors Note" for illness.  She is asking for a physician note.   She realizes that she can only get a note for being seen.   She is wanting a note to say "she called but did not come to the office to be seen " sign and fax to work- [phone]-  [phone] "attention [name]".  Reviewed Health History In EMR: Yes  Reviewed Medications In EMR: Yes  Reviewed Allergies In EMR: Yes  Reviewed Surgeries / Procedures: Yes  Date of Onset of Symptoms: 10/07/2013 OB / GYN:  LMP: Unknown  Guideline(s) Used:  Vomiting  No Protocol Available - Sick Adult  Disposition Per Guideline:   Discuss with PCP and Callback by Nurse Today  Reason For Disposition Reached:   Nursing judgment  Advice Given:  Call Back If:  New symptoms develop  You become worse.  RN Overrode Recommendation:  Document Patient  Patient requesting work note.

## 2013-10-18 ENCOUNTER — Other Ambulatory Visit: Payer: Self-pay | Admitting: Family Medicine

## 2013-10-21 ENCOUNTER — Encounter (HOSPITAL_COMMUNITY): Payer: Self-pay | Admitting: Pharmacy Technician

## 2013-10-21 ENCOUNTER — Encounter (HOSPITAL_COMMUNITY): Payer: Self-pay | Admitting: *Deleted

## 2013-10-21 NOTE — Progress Notes (Signed)
Preop instructions along with medical history and medications reviewed with patient.  Patient voiced understanding.

## 2013-10-21 NOTE — Progress Notes (Signed)
Surgery on 10/23/13.  Need orders in EPIC.  Thank You.

## 2013-10-22 ENCOUNTER — Other Ambulatory Visit: Payer: Self-pay

## 2013-10-22 ENCOUNTER — Ambulatory Visit (HOSPITAL_COMMUNITY): Payer: BC Managed Care – PPO | Admitting: Anesthesiology

## 2013-10-22 ENCOUNTER — Encounter (HOSPITAL_COMMUNITY)
Admission: RE | Disposition: A | Discharge status: Home Health | Payer: Self-pay | Source: Ambulatory Visit | Attending: Orthopedic Surgery

## 2013-10-22 ENCOUNTER — Inpatient Hospital Stay (HOSPITAL_COMMUNITY): Admission: RE | Admit: 2013-10-22 | Payer: BC Managed Care – PPO | Source: Ambulatory Visit

## 2013-10-22 ENCOUNTER — Encounter (HOSPITAL_COMMUNITY): Payer: Self-pay | Admitting: *Deleted

## 2013-10-22 ENCOUNTER — Inpatient Hospital Stay (HOSPITAL_COMMUNITY)
Admission: RE | Admit: 2013-10-22 | Discharge: 2013-10-24 | DRG: 478 | Disposition: A | Discharge status: Home Health | Payer: BC Managed Care – PPO | Source: Ambulatory Visit | Attending: Orthopedic Surgery | Admitting: Orthopedic Surgery

## 2013-10-22 ENCOUNTER — Encounter (HOSPITAL_COMMUNITY): Payer: BC Managed Care – PPO | Admitting: Anesthesiology

## 2013-10-22 DIAGNOSIS — Z79899 Other long term (current) drug therapy: Secondary | ICD-10-CM

## 2013-10-22 DIAGNOSIS — J45909 Unspecified asthma, uncomplicated: Secondary | ICD-10-CM | POA: Diagnosis present

## 2013-10-22 DIAGNOSIS — F3289 Other specified depressive episodes: Secondary | ICD-10-CM | POA: Diagnosis present

## 2013-10-22 DIAGNOSIS — Z9109 Other allergy status, other than to drugs and biological substances: Secondary | ICD-10-CM

## 2013-10-22 DIAGNOSIS — S83231A Complex tear of medial meniscus, current injury, right knee, initial encounter: Secondary | ICD-10-CM | POA: Diagnosis present

## 2013-10-22 DIAGNOSIS — M8558 Aneurysmal bone cyst, other site: Secondary | ICD-10-CM

## 2013-10-22 DIAGNOSIS — Z881 Allergy status to other antibiotic agents status: Secondary | ICD-10-CM

## 2013-10-22 DIAGNOSIS — IMO0002 Reserved for concepts with insufficient information to code with codable children: Principal | ICD-10-CM | POA: Diagnosis present

## 2013-10-22 DIAGNOSIS — G8929 Other chronic pain: Secondary | ICD-10-CM | POA: Diagnosis present

## 2013-10-22 DIAGNOSIS — Z6841 Body Mass Index (BMI) 40.0 and over, adult: Secondary | ICD-10-CM

## 2013-10-22 DIAGNOSIS — K219 Gastro-esophageal reflux disease without esophagitis: Secondary | ICD-10-CM | POA: Diagnosis present

## 2013-10-22 DIAGNOSIS — M1711 Unilateral primary osteoarthritis, right knee: Secondary | ICD-10-CM | POA: Diagnosis present

## 2013-10-22 DIAGNOSIS — G4730 Sleep apnea, unspecified: Secondary | ICD-10-CM | POA: Diagnosis present

## 2013-10-22 DIAGNOSIS — F329 Major depressive disorder, single episode, unspecified: Secondary | ICD-10-CM | POA: Diagnosis present

## 2013-10-22 DIAGNOSIS — X58XXXA Exposure to other specified factors, initial encounter: Secondary | ICD-10-CM | POA: Diagnosis present

## 2013-10-22 DIAGNOSIS — M8550 Aneurysmal bone cyst, unspecified site: Secondary | ICD-10-CM | POA: Diagnosis present

## 2013-10-22 DIAGNOSIS — F411 Generalized anxiety disorder: Secondary | ICD-10-CM | POA: Diagnosis present

## 2013-10-22 DIAGNOSIS — M549 Dorsalgia, unspecified: Secondary | ICD-10-CM | POA: Diagnosis present

## 2013-10-22 DIAGNOSIS — I10 Essential (primary) hypertension: Secondary | ICD-10-CM | POA: Diagnosis present

## 2013-10-22 DIAGNOSIS — E7800 Pure hypercholesterolemia, unspecified: Secondary | ICD-10-CM | POA: Diagnosis present

## 2013-10-22 DIAGNOSIS — M8700 Idiopathic aseptic necrosis of unspecified bone: Secondary | ICD-10-CM | POA: Diagnosis present

## 2013-10-22 DIAGNOSIS — Z888 Allergy status to other drugs, medicaments and biological substances status: Secondary | ICD-10-CM

## 2013-10-22 DIAGNOSIS — M1710 Unilateral primary osteoarthritis, unspecified knee: Secondary | ICD-10-CM | POA: Diagnosis present

## 2013-10-22 DIAGNOSIS — Z8249 Family history of ischemic heart disease and other diseases of the circulatory system: Secondary | ICD-10-CM

## 2013-10-22 DIAGNOSIS — Z791 Long term (current) use of non-steroidal anti-inflammatories (NSAID): Secondary | ICD-10-CM

## 2013-10-22 HISTORY — PX: KNEE ARTHROSCOPY WITH MEDIAL MENISECTOMY: SHX5651

## 2013-10-22 HISTORY — DX: Anemia, unspecified: D64.9

## 2013-10-22 HISTORY — DX: Sleep apnea, unspecified: G47.30

## 2013-10-22 HISTORY — DX: Other specified postprocedural states: Z98.890

## 2013-10-22 HISTORY — DX: Unspecified osteoarthritis, unspecified site: M19.90

## 2013-10-22 HISTORY — DX: Aneurysmal bone cyst, other site: M85.58

## 2013-10-22 HISTORY — DX: Complex tear of medial meniscus, current injury, right knee, initial encounter: S83.231A

## 2013-10-22 HISTORY — DX: Nausea with vomiting, unspecified: R11.2

## 2013-10-22 LAB — CBC
HCT: 38.5 % (ref 36.0–46.0)
Hemoglobin: 12.9 g/dL (ref 12.0–15.0)
MCH: 30.4 pg (ref 26.0–34.0)
MCHC: 33.5 g/dL (ref 30.0–36.0)
MCV: 90.8 fL (ref 78.0–100.0)
Platelets: 260 10*3/uL (ref 150–400)
RBC: 4.24 MIL/uL (ref 3.87–5.11)
RDW: 13.6 % (ref 11.5–15.5)
WBC: 9.6 10*3/uL (ref 4.0–10.5)

## 2013-10-22 LAB — BASIC METABOLIC PANEL
BUN: 22 mg/dL (ref 6–23)
CO2: 23 mEq/L (ref 19–32)
Calcium: 9.9 mg/dL (ref 8.4–10.5)
Chloride: 100 mEq/L (ref 96–112)
Creatinine, Ser: 0.9 mg/dL (ref 0.50–1.10)
GFR calc Af Amer: 85 mL/min — ABNORMAL LOW (ref >90)
GFR calc non Af Amer: 73 mL/min — ABNORMAL LOW (ref >90)
Glucose, Bld: 105 mg/dL — ABNORMAL HIGH (ref 70–99)
Potassium: 4 mEq/L (ref 3.7–5.3)
Sodium: 138 mEq/L (ref 137–147)

## 2013-10-22 SURGERY — ARTHROSCOPY, KNEE, WITH MEDIAL MENISCECTOMY
Anesthesia: General | Site: Knee | Laterality: Right

## 2013-10-22 MED ORDER — BUDESONIDE-FORMOTEROL FUMARATE 160-4.5 MCG/ACT IN AERO: 2.0000 | INHALATION_SPRAY | Freq: Two times a day (BID) | RESPIRATORY_TRACT | Status: DC | PRN

## 2013-10-22 MED ORDER — ALBUTEROL SULFATE (2.5 MG/3ML) 0.083% IN NEBU: 2.5000 mg | INHALATION_SOLUTION | Freq: Four times a day (QID) | RESPIRATORY_TRACT | Status: DC | PRN

## 2013-10-22 MED ORDER — SIMVASTATIN 5 MG PO TABS
5.0000 mg | ORAL_TABLET | Freq: Every day | ORAL | Status: DC
  Administered 2013-10-22 – 2013-10-23 (×2): 5 mg via ORAL
  Filled 2013-10-22 (×3): qty 1

## 2013-10-22 MED ORDER — POLYETHYLENE GLYCOL 3350 17 G PO PACK
17.0000 g | PACK | Freq: Every day | ORAL | Status: DC | PRN
  Filled 2013-10-22: qty 1

## 2013-10-22 MED ORDER — FENTANYL CITRATE 0.05 MG/ML IJ SOLN
INTRAMUSCULAR | Status: AC
  Filled 2013-10-22: qty 5

## 2013-10-22 MED ORDER — LACTATED RINGERS IV SOLN: INTRAVENOUS | Status: DC

## 2013-10-22 MED ORDER — FUROSEMIDE 20 MG PO TABS
20.0000 mg | ORAL_TABLET | Freq: Every day | ORAL | Status: DC
  Administered 2013-10-22 – 2013-10-24 (×3): 20 mg via ORAL
  Filled 2013-10-22 (×3): qty 1

## 2013-10-22 MED ORDER — HYDROMORPHONE HCL PF 1 MG/ML IJ SOLN
INTRAMUSCULAR | Status: AC
  Filled 2013-10-22: qty 1

## 2013-10-22 MED ORDER — POLYVINYL ALCOHOL 1.4 % OP SOLN
1.0000 [drp] | Freq: Two times a day (BID) | OPHTHALMIC | Status: DC
  Administered 2013-10-23 – 2013-10-24 (×2): 1 [drp] via OPHTHALMIC
  Filled 2013-10-22: qty 15

## 2013-10-22 MED ORDER — CARBOXYMETHYLCELLULOSE SODIUM 1 % OP SOLN: 1.0000 [drp] | Freq: Two times a day (BID) | OPHTHALMIC | Status: DC

## 2013-10-22 MED ORDER — HYDROCODONE-ACETAMINOPHEN 5-325 MG PO TABS: 1.0000 | ORAL_TABLET | ORAL | Status: DC | PRN

## 2013-10-22 MED ORDER — INSULIN ASPART 100 UNIT/ML ~~LOC~~ SOLN: 0.0000 [IU] | Freq: Three times a day (TID) | SUBCUTANEOUS | Status: DC

## 2013-10-22 MED ORDER — BISACODYL 10 MG RE SUPP: 10.0000 mg | Freq: Every day | RECTAL | Status: DC | PRN

## 2013-10-22 MED ORDER — MONTELUKAST SODIUM 10 MG PO TABS
10.0000 mg | ORAL_TABLET | Freq: Every day | ORAL | Status: DC
  Administered 2013-10-22 – 2013-10-23 (×2): 10 mg via ORAL
  Filled 2013-10-22 (×3): qty 1

## 2013-10-22 MED ORDER — SERTRALINE HCL 100 MG PO TABS
100.0000 mg | ORAL_TABLET | Freq: Two times a day (BID) | ORAL | Status: DC
  Administered 2013-10-22 – 2013-10-24 (×4): 100 mg via ORAL
  Filled 2013-10-22 (×5): qty 1

## 2013-10-22 MED ORDER — PROPOFOL 10 MG/ML IV BOLUS
INTRAVENOUS | Status: DC | PRN
  Administered 2013-10-22: 200 mg via INTRAVENOUS
  Administered 2013-10-22: 100 mg via INTRAVENOUS

## 2013-10-22 MED ORDER — BACITRACIN ZINC 500 UNIT/GM EX OINT
TOPICAL_OINTMENT | CUTANEOUS | Status: DC | PRN
  Administered 2013-10-22: 1 via TOPICAL

## 2013-10-22 MED ORDER — CLINDAMYCIN PHOSPHATE 900 MG/50ML IV SOLN
900.0000 mg | INTRAVENOUS | Status: AC
  Administered 2013-10-22: 900 mg via INTRAVENOUS

## 2013-10-22 MED ORDER — PROPOFOL 10 MG/ML IV BOLUS
INTRAVENOUS | Status: AC
  Filled 2013-10-22: qty 20

## 2013-10-22 MED ORDER — METHOCARBAMOL 100 MG/ML IJ SOLN
500.0000 mg | Freq: Four times a day (QID) | INTRAVENOUS | Status: DC | PRN
  Administered 2013-10-22: 500 mg via INTRAVENOUS
  Filled 2013-10-22: qty 5

## 2013-10-22 MED ORDER — OXYCODONE-ACETAMINOPHEN 5-325 MG PO TABS
1.0000 | ORAL_TABLET | ORAL | Status: DC | PRN
  Administered 2013-10-22 – 2013-10-24 (×8): 1 via ORAL
  Filled 2013-10-22 (×9): qty 1

## 2013-10-22 MED ORDER — HYDROMORPHONE HCL PF 1 MG/ML IJ SOLN: 0.2500 mg | INTRAMUSCULAR | Status: DC | PRN

## 2013-10-22 MED ORDER — DEXAMETHASONE SODIUM PHOSPHATE 10 MG/ML IJ SOLN
INTRAMUSCULAR | Status: AC
  Filled 2013-10-22: qty 1

## 2013-10-22 MED ORDER — ALBUTEROL SULFATE HFA 108 (90 BASE) MCG/ACT IN AERS: 2.0000 | INHALATION_SPRAY | Freq: Four times a day (QID) | RESPIRATORY_TRACT | Status: DC | PRN

## 2013-10-22 MED ORDER — ENOXAPARIN SODIUM 40 MG/0.4ML ~~LOC~~ SOLN
40.0000 mg | SUBCUTANEOUS | Status: DC
Start: 2013-10-23 — End: 2013-10-24
  Administered 2013-10-23 – 2013-10-24 (×2): 40 mg via SUBCUTANEOUS
  Filled 2013-10-22 (×2): qty 0.4

## 2013-10-22 MED ORDER — PROMETHAZINE HCL 25 MG/ML IJ SOLN: 6.2500 mg | INTRAMUSCULAR | Status: DC | PRN

## 2013-10-22 MED ORDER — ZOLPIDEM TARTRATE 5 MG PO TABS
5.0000 mg | ORAL_TABLET | Freq: Every day | ORAL | Status: DC
  Administered 2013-10-22 – 2013-10-23 (×2): 5 mg via ORAL
  Filled 2013-10-22 (×2): qty 1

## 2013-10-22 MED ORDER — ALPRAZOLAM 1 MG PO TABS
1.0000 mg | ORAL_TABLET | Freq: Every day | ORAL | Status: DC
  Administered 2013-10-22 – 2013-10-23 (×2): 1 mg via ORAL
  Filled 2013-10-22 (×2): qty 1

## 2013-10-22 MED ORDER — FENTANYL CITRATE 0.05 MG/ML IJ SOLN
INTRAMUSCULAR | Status: AC
  Filled 2013-10-22: qty 2

## 2013-10-22 MED ORDER — AMLODIPINE BESYLATE 10 MG PO TABS
10.0000 mg | ORAL_TABLET | Freq: Every morning | ORAL | Status: DC
  Administered 2013-10-23 – 2013-10-24 (×2): 10 mg via ORAL
  Filled 2013-10-22 (×2): qty 1

## 2013-10-22 MED ORDER — OXYCODONE HCL 5 MG PO TABS
5.0000 mg | ORAL_TABLET | ORAL | Status: DC | PRN
  Administered 2013-10-22 – 2013-10-24 (×9): 5 mg via ORAL
  Filled 2013-10-22 (×10): qty 1

## 2013-10-22 MED ORDER — CLINDAMYCIN PHOSPHATE 900 MG/50ML IV SOLN
INTRAVENOUS | Status: AC
  Filled 2013-10-22: qty 50

## 2013-10-22 MED ORDER — HYDROMORPHONE HCL PF 1 MG/ML IJ SOLN
0.2500 mg | INTRAMUSCULAR | Status: DC | PRN
  Administered 2013-10-22 (×6): 0.5 mg via INTRAVENOUS

## 2013-10-22 MED ORDER — BACITRACIN ZINC 500 UNIT/GM EX OINT
TOPICAL_OINTMENT | CUTANEOUS | Status: AC
  Filled 2013-10-22: qty 28.35

## 2013-10-22 MED ORDER — ONDANSETRON HCL 4 MG/2ML IJ SOLN
INTRAMUSCULAR | Status: AC
  Filled 2013-10-22: qty 2

## 2013-10-22 MED ORDER — MIDAZOLAM HCL 5 MG/5ML IJ SOLN
INTRAMUSCULAR | Status: DC | PRN
  Administered 2013-10-22: 2 mg via INTRAVENOUS

## 2013-10-22 MED ORDER — ONDANSETRON HCL 4 MG/2ML IJ SOLN
INTRAMUSCULAR | Status: DC | PRN
  Administered 2013-10-22: 4 mg via INTRAVENOUS

## 2013-10-22 MED ORDER — MIDAZOLAM HCL 2 MG/2ML IJ SOLN
INTRAMUSCULAR | Status: AC
  Filled 2013-10-22: qty 2

## 2013-10-22 MED ORDER — CHLORHEXIDINE GLUCONATE 4 % EX LIQD
60.0000 mL | Freq: Once | CUTANEOUS | Status: DC
Start: 2013-10-22 — End: 2013-10-22

## 2013-10-22 MED ORDER — ONDANSETRON HCL 4 MG PO TABS: 4.0000 mg | ORAL_TABLET | Freq: Four times a day (QID) | ORAL | Status: DC | PRN

## 2013-10-22 MED ORDER — BUPIVACAINE-EPINEPHRINE 0.25% -1:200000 IJ SOLN
INTRAMUSCULAR | Status: DC | PRN
  Administered 2013-10-22: 30 mL

## 2013-10-22 MED ORDER — HYDROMORPHONE HCL PF 1 MG/ML IJ SOLN
0.5000 mg | INTRAMUSCULAR | Status: DC | PRN
  Administered 2013-10-22 – 2013-10-23 (×2): 1 mg via INTRAVENOUS
  Filled 2013-10-22 (×3): qty 1

## 2013-10-22 MED ORDER — OXYCODONE-ACETAMINOPHEN 10-325 MG PO TABS: 1.0000 | ORAL_TABLET | ORAL | Status: DC | PRN

## 2013-10-22 MED ORDER — LACTATED RINGERS IV SOLN
INTRAVENOUS | Status: DC
  Administered 2013-10-22: 1000 mL via INTRAVENOUS

## 2013-10-22 MED ORDER — FLEET ENEMA 7-19 GM/118ML RE ENEM: 1.0000 | ENEMA | Freq: Once | RECTAL | Status: AC | PRN

## 2013-10-22 MED ORDER — LISINOPRIL 20 MG PO TABS
20.0000 mg | ORAL_TABLET | Freq: Every morning | ORAL | Status: DC
  Administered 2013-10-23 – 2013-10-24 (×2): 20 mg via ORAL
  Filled 2013-10-22 (×2): qty 1

## 2013-10-22 MED ORDER — BUPIVACAINE-EPINEPHRINE PF 0.25-1:200000 % IJ SOLN
INTRAMUSCULAR | Status: AC
  Filled 2013-10-22: qty 30

## 2013-10-22 MED ORDER — DOCUSATE SODIUM 100 MG PO CAPS
100.0000 mg | ORAL_CAPSULE | Freq: Two times a day (BID) | ORAL | Status: DC
  Administered 2013-10-22 – 2013-10-24 (×4): 100 mg via ORAL
  Filled 2013-10-22 (×5): qty 1

## 2013-10-22 MED ORDER — METHOCARBAMOL 500 MG PO TABS
500.0000 mg | ORAL_TABLET | Freq: Four times a day (QID) | ORAL | Status: DC | PRN
  Administered 2013-10-23 – 2013-10-24 (×4): 500 mg via ORAL
  Filled 2013-10-22 (×4): qty 1

## 2013-10-22 MED ORDER — DEXAMETHASONE SODIUM PHOSPHATE 10 MG/ML IJ SOLN
INTRAMUSCULAR | Status: DC | PRN
  Administered 2013-10-22: 10 mg via INTRAVENOUS

## 2013-10-22 MED ORDER — CLINDAMYCIN PHOSPHATE 600 MG/50ML IV SOLN
600.0000 mg | Freq: Four times a day (QID) | INTRAVENOUS | Status: AC
  Administered 2013-10-22 – 2013-10-23 (×3): 600 mg via INTRAVENOUS
  Filled 2013-10-22 (×3): qty 50

## 2013-10-22 MED ORDER — HYDROMORPHONE HCL PF 1 MG/ML IJ SOLN
INTRAMUSCULAR | Status: AC
Start: 2013-10-22 — End: 2013-10-22
  Administered 2013-10-22: 1 mg
  Filled 2013-10-22: qty 1

## 2013-10-22 MED ORDER — LACTATED RINGERS IV SOLN
INTRAVENOUS | Status: DC
  Administered 2013-10-22: 15:00:00 via INTRAVENOUS

## 2013-10-22 MED ORDER — FENTANYL CITRATE 0.05 MG/ML IJ SOLN
INTRAMUSCULAR | Status: DC | PRN
  Administered 2013-10-22 (×4): 25 ug via INTRAVENOUS
  Administered 2013-10-22: 50 ug via INTRAVENOUS
  Administered 2013-10-22: 25 ug via INTRAVENOUS
  Administered 2013-10-22 (×5): 50 ug via INTRAVENOUS
  Administered 2013-10-22: 25 ug via INTRAVENOUS

## 2013-10-22 MED ORDER — ONDANSETRON HCL 4 MG/2ML IJ SOLN: 4.0000 mg | Freq: Four times a day (QID) | INTRAMUSCULAR | Status: DC | PRN

## 2013-10-22 SURGICAL SUPPLY — 45 items
BANDAGE ELASTIC 4 VELCRO ST LF (GAUZE/BANDAGES/DRESSINGS) ×2 IMPLANT
BANDAGE ELASTIC 6 VELCRO ST LF (GAUZE/BANDAGES/DRESSINGS) ×1 IMPLANT
BIT DRILL 2.4X128 (BIT) ×1 IMPLANT
BLADE GREAT WHITE 4.2 (BLADE) IMPLANT
BLADE SAW SGTL 81X20 HD (BLADE) ×1 IMPLANT
BNDG COHESIVE 6X5 TAN STRL LF (GAUZE/BANDAGES/DRESSINGS) ×2 IMPLANT
BONE CHIP PRESERV 30CC PCAN30 (Bone Implant) ×2 IMPLANT
CUFF TOURN SGL QUICK 44 (TOURNIQUET CUFF) ×1 IMPLANT
DRAPE INCISE IOBAN 66X45 STRL (DRAPES) ×1 IMPLANT
DRAPE LG THREE QUARTER DISP (DRAPES) ×2 IMPLANT
DRAPE TABLE BACK 44X90 PK DISP (DRAPES) ×1 IMPLANT
DRSG ADAPTIC 3X8 NADH LF (GAUZE/BANDAGES/DRESSINGS) ×2 IMPLANT
DRSG PAD ABDOMINAL 8X10 ST (GAUZE/BANDAGES/DRESSINGS) ×4 IMPLANT
DURAPREP 26ML APPLICATOR (WOUND CARE) ×2 IMPLANT
GLOVE BIOGEL PI IND STRL 6.5 (GLOVE) IMPLANT
GLOVE BIOGEL PI IND STRL 8 (GLOVE) ×1 IMPLANT
GLOVE BIOGEL PI INDICATOR 6.5 (GLOVE) ×1
GLOVE BIOGEL PI INDICATOR 8 (GLOVE) ×1
GLOVE ECLIPSE 8.0 STRL XLNG CF (GLOVE) ×2 IMPLANT
GLOVE SURG SS PI 6.5 STRL IVOR (GLOVE) ×1 IMPLANT
GOWN STRL REUS W/TWL XL LVL3 (GOWN DISPOSABLE) ×2 IMPLANT
GRAFT BNE CANC CHIPS 1-8 30CC (Bone Implant) IMPLANT
LEGGING LITHOTOMY PAIR STRL (DRAPES) ×1 IMPLANT
MANIFOLD NEPTUNE II (INSTRUMENTS) ×2 IMPLANT
PACK ARTHROSCOPY WL (CUSTOM PROCEDURE TRAY) ×2 IMPLANT
PACK ICE MAXI GEL EZY WRAP (MISCELLANEOUS) ×2 IMPLANT
PAD ABD 8X10 STRL (GAUZE/BANDAGES/DRESSINGS) ×1 IMPLANT
PAD MASON LEG HOLDER (PIN) ×2 IMPLANT
SET ARTHROSCOPY TUBING (MISCELLANEOUS) ×2
SET ARTHROSCOPY TUBING LN (MISCELLANEOUS) ×1 IMPLANT
SPONGE GAUZE 4X4 12PLY (GAUZE/BANDAGES/DRESSINGS) ×1 IMPLANT
SPONGE LAP 18X18 X RAY DECT (DISPOSABLE) ×1 IMPLANT
STRIP CLOSURE SKIN 1/2X4 (GAUZE/BANDAGES/DRESSINGS) ×1 IMPLANT
SUCTION FRAZIER TIP 10 FR DISP (SUCTIONS) ×1 IMPLANT
SUT ETHILON 3 0 PS 1 (SUTURE) ×2 IMPLANT
SUT MNCRL AB 4-0 PS2 18 (SUTURE) ×1 IMPLANT
SUT VIC AB 1 CT1 27 (SUTURE) ×2
SUT VIC AB 1 CT1 27XBRD ANTBC (SUTURE) IMPLANT
SUT VIC AB 2-0 CT1 27 (SUTURE) ×4
SUT VIC AB 2-0 CT1 TAPERPNT 27 (SUTURE) IMPLANT
SYR BULB IRRIGATION 50ML (SYRINGE) ×1 IMPLANT
TOWEL OR 17X26 10 PK STRL BLUE (TOWEL DISPOSABLE) ×3 IMPLANT
TUBING CONNECTING 10 (TUBING) ×1 IMPLANT
WAND 90 DEG TURBOVAC W/CORD (SURGICAL WAND) ×2 IMPLANT
WRAP KNEE MAXI GEL POST OP (GAUZE/BANDAGES/DRESSINGS) ×2 IMPLANT

## 2013-10-22 NOTE — Brief Op Note (Signed)
10/22/2013  1:57 PM  PATIENT:  [name]  53 y.o. female  PRE-OPERATIVE DIAGNOSIS:  RIGHT KNEE MEDIAL MENISCUS TEAR and Osteoarthritis, Aneurysmal Bone Cyst Tibia  POST-OPERATIVE DIAGNOSIS:  Same as Pre-Op  PROCEDURE:  Procedure(s): RIGHT KNEE ARTHROSCOPY WITH MEDIAL MENISECTOMY, abrasion chrondrplasty Brigid Re/CLEAN OUT/CORRECT CURETTEMENT/BONE GRAFT/PROXIMAL TIBIA  (Right)  SURGEON:  Surgeon(s) and Role:    * Jacki Conesonald A Aedin Jeansonne, MD - Primary  PHYSICIAN ASSISTANT: Dimitri PedAmber Constable PA  ASSISTANTS: Dimitri PedAmber Constable PA  ANESTHESIA:   general  EBL:  Total I/O In: 500 [I.V.:500] Out: -   BLOOD ADMINISTERED:none  DRAINS: none   LOCAL MEDICATIONS USED:  MARCAINE 30cc of 0.25%   With Epinephrine  SPECIMEN:  Source of Specimen:  Proximal Tibis Metaphysis on the Right  DISPOSITION OF SPECIMEN:  PATHOLOGY  COUNTS:  YES  TOURNIQUET:   Total Tourniquet Time Documented: Thigh (Right) - 73 minutes Total: Thigh (Right) - 73 minutes   DICTATION: .Other Dictation: Dictation Number [id number]  PLAN OF CARE: Admit to inpatient   PATIENT DISPOSITION:  Stable in OR   Delay start of Pharmacological VTE agent (>24hrs) due to surgical blood loss or risk of bleeding: yes

## 2013-10-22 NOTE — Anesthesia Preprocedure Evaluation (Signed)
Anesthesia Evaluation  Patient identified by MRN, date of birth, ID band Patient awake    Reviewed: Allergy & Precautions, H&P , NPO status , Patient's Chart, lab work & pertinent test results  Airway Mallampati: III TM Distance: >3 FB Neck ROM: Full    Dental  (+) Teeth Intact, Dental Advisory Given   Pulmonary neg pulmonary ROS, shortness of breath, asthma , sleep apnea and Continuous Positive Airway Pressure Ventilation ,  breath sounds clear to auscultation  Pulmonary exam normal       Cardiovascular hypertension, Pt. on medications Rhythm:Regular Rate:Normal     Neuro/Psych  Headaches, Anxiety Depression negative neurological ROS  negative psych ROS   GI/Hepatic Neg liver ROS, GERD-  ,  Endo/Other  negative endocrine ROSMorbid obesity  Renal/GU negative Renal ROS  negative genitourinary   Musculoskeletal negative musculoskeletal ROS (+)   Abdominal (+) + obese,   Peds  Hematology negative hematology ROS (+) anemia ,   Anesthesia Other Findings   Reproductive/Obstetrics                           Anesthesia Physical Anesthesia Plan  ASA: III  Anesthesia Plan: General   Post-op Pain Management:    Induction: Intravenous  Airway Management Planned: LMA  Additional Equipment:   Intra-op Plan:   Post-operative Plan: Extubation in OR  Informed Consent: I have reviewed the patients History and Physical, chart, labs and discussed the procedure including the risks, benefits and alternatives for the proposed anesthesia with the patient or authorized representative who has indicated his/her understanding and acceptance.   Dental advisory given  Plan Discussed with: CRNA  Anesthesia Plan Comments:         Anesthesia Quick Evaluation

## 2013-10-22 NOTE — Anesthesia Postprocedure Evaluation (Signed)
Anesthesia Post Note  Patient: [name]  Procedure(s) Performed: Procedure(s) (LRB): RIGHT KNEE ARTHROSCOPY WITH MEDIAL MENISECTOMY, abrasion chrondrplasty Brigid Re/CLEAN OUT/CORRECT CURETTEMENT/BONE GRAFT/PROXIMAL TIBIA  (Right)  Anesthesia type: General  Patient location: PACU  Post pain: Pain level controlled  Post assessment: Post-op Vital signs reviewed  Last Vitals:  Filed Vitals:   10/22/13 1440  BP:   Pulse: 96  Temp:   Resp: 14    Post vital signs: Reviewed  Level of consciousness: sedated  Complications: No apparent anesthesia complications

## 2013-10-22 NOTE — Progress Notes (Signed)
Call placed to dr Nadean Corwinfortune,anesthologist re need for pregnancy test. Pt states she thinks she has completed menopause but she had ablation 2 years ago.  Also states she has been sexually active in >693months and does notbelieve she needs pregnancy test.  Dr fortune stated he will call me back as he is currently with a patient

## 2013-10-22 NOTE — Interval H&P Note (Signed)
History and Physical Interval Note:  10/22/2013 12:09 PM  [name]  has presented today for surgery, with the diagnosis of RIGHT KNEE MEDIAL MENISCUS TEAR  The various methods of treatment have been discussed with the patient and family. After consideration of risks, benefits and other options for treatment, the patient has consented to  Procedure(s): RIGHT KNEE ARTHROSCOPY WITH MEDIAL MENISECTOMY/CLEAN OUT/CORRECT CURETTEMENT/BONE GRAFT/PROXIMAL TIBIA  (Right) as a surgical intervention .  The patient's history has been reviewed, patient examined, no change in status, stable for surgery.  I have reviewed the patient's chart and labs.  Questions were answered to the patient's satisfaction.     Milly Goggins A

## 2013-10-22 NOTE — Op Note (Signed)
NAMEHAELY, [name]           ACCOUNT NO.:  [id number]  MEDICAL RECORD NO.:  [id number]  LOCATION:  1333                         FACILITY:  [facility]  PHYSICIAN:  Georges Lynch. Saurabh Hettich, M.D.DATE OF BIRTH:  [birth year]  DATE OF PROCEDURE:  10/22/2013 DATE OF DISCHARGE:                              OPERATIVE REPORT   SURGEON:  Georges Lynch. Darrelyn Hillock, MD  ASSISTANT:  Dimitri Ped, PA  PREOPERATIVE DIAGNOSES: 1. Osteoarthritis, right knee. 2. Torn medial meniscus, right knee. 3. What appears to be an aneurysmal bone cyst versus a avascular     necrosis lesion in the proximal tibial metaphysis, right knee.  POSTOPERATIVE DIAGNOSES: 1. Diagnostic arthroscopy, right knee. 2. Medial meniscectomy, right knee. 3. Abrasion chondroplasty, medial femoral condyle, right knee. 4. After the arthroscopic procedure, we did a separate procedure, I     removed the bone from the proximal tibia and did a curette on the     lesion in the proximal tibial metaphysis and insertion of     cancellous bone graft which was cadaver graft.  PROCEDURE:  Under general anesthesia, routine orthopedic prepping and draping in the right lower extremity was carried out.  The leg was exsanguinated with Esmarch, tourniquet was elevated at 325 mmHg.  The leg was then placed in a knee holder and then a sterile prepping and draping was carried out.  Prior to this, we did 2 separate time-outs at a leave room, came back and we did a second time-out.  I also marked the appropriate right leg in the holding area.  After the prep was done, she had Cleocin 900 mg IV.  We then made a small punctate incision in the suprapatellar pouch.  Inflow cannula was inserted.  Knee was distended with saline.  Another small punctate incision was made in the anterolateral joint.  The arthroscope was entered from lateral approach and a complete diagnostic arthroscopy was carried out.  Note, the main issue she had was a significant degenerative  arthritic changes in the medial joint, moderate in the lateral joint.  She had a torn medial meniscus, which was shredded to pieces.  I simply used the ArthroCare and did a partial medial meniscectomy.  I also did an abrasion chondroplasty of the medial femoral condyle.  Thoroughly irrigated out the knee, closed all 3 punctate incisions with 3-0 nylon suture.  I injected 30 mL of 0.25% Marcaine with epinephrine in the knee joint. Next, Vi-Drape then was placed around the proximal tibia.  An incision was made over the anterior aspect of the right tibia.  Bleeders identified and cauterized.  I then stripped the soft tissue from the tibial metaphyseal region.  Drill holes were made in the proximal tibial metaphyseal region and we made sure we were well below the joint, had a needle in the joint to make sure we were below the joint surface.  A bone window then was taken with the oscillating saw.  I then utilized a curette and curetted out a large lesion in the tibial metaphysis according to the MRI findings.  The tissue was a peculiar type granulation tissue.  I did send the tissue to the lab for biopsies.  Thoroughly irrigated out the area.  I then packed the cadaver cancellous bone graft, and I utilized the bone tap, packed that in well.  After this was finished, I then replaced the bone window, closed the wound in layers in usual fashion.  Sterile dressings were applied.          ______________________________ Georges Lynchonald A. Darrelyn HillockGioffre, M.D.     RAG/MEDQ  D:  10/22/2013  T:  10/22/2013  Job:  [id number]

## 2013-10-22 NOTE — Transfer of Care (Signed)
Immediate Anesthesia Transfer of Care Note  Patient: [name]  Procedure(s) Performed: Procedure(s): RIGHT KNEE ARTHROSCOPY WITH MEDIAL MENISECTOMY, abrasion chrondrplasty Brigid Re/CLEAN OUT/CORRECT CURETTEMENT/BONE GRAFT/PROXIMAL TIBIA  (Right)  Patient Location: PACU  Anesthesia Type:General  Level of Consciousness: awake, alert , oriented and patient cooperative  Airway & Oxygen Therapy: Patient Spontanous Breathing and Patient connected to face mask oxygen  Post-op Assessment: Report given to PACU RN and Post -op Vital signs reviewed and stable  Post vital signs: Reviewed and stable  Complications: No apparent anesthesia complications

## 2013-10-22 NOTE — H&P (Signed)
[name] is an 53 y.o. female.   Chief Complaint: right knee pain HPI: The patient is a 53 year old female who has been treated for several months for her continued with knee pain. She was initially treated for her right knee medial meniscus tear. She had a right knee arthroscopy with medial meniscus debridement. She continued to have pain following recovery of that surgery. She has cortisone injections with no improvement. Repeat MRI showed another medial meniscus tear as well as an area of AVN over the proximal tibia.   Past Medical History  Diagnosis Date  . Chronic back pain   . Anxiety   . Depression   . Hypertension   . High cholesterol   . GERD (gastroesophageal reflux disease)   . Colon polyps   . Asthma   . Trigger finger of both hands 2007    thumb  . PONV (postoperative nausea and vomiting)   . Migraines     hx of   . Arthritis   . Anemia     hx of with pregnancy   . Sleep apnea     does not use CPAP machine     Past Surgical History  Procedure Laterality Date  . Back surgery    . Endometrial ablation w/ novasure    . Carpal tunnel release  2007    both hands  . Trigger finger release surgeyr       bilateral   . Tubal ligation    . Knee arthroscopy      let knee 03/2013     Family History  Problem Relation Age of Onset  . Alcohol abuse Mother   . Arthritis Mother   . Hyperlipidemia Father   . Hypertension Father   . Heart disease Father 6    CABG  . Hypertension Sister   . Cancer Brother     colon, lung, liver  . Cancer Maternal Grandmother     breast  . Cancer Daughter     NEUROBLASTOMA   Social History:  reports that she has never smoked. She has never used smokeless tobacco. She reports that she does not drink alcohol or use illicit drugs.  Allergies:  Allergies  Allergen Reactions  . Adhesive [Tape]     Peeled off skin  . Ceclor [Cefaclor]     Blisters on hands  . Venlafaxine     "refuses to take"    Current outpatient  prescriptions: acetaminophen (TYLENOL) 500 MG tablet, Take 1,000 mg by mouth every 6 (six) hours as needed (Pain)., Disp: , Rfl: ;   albuterol (PROVENTIL HFA;VENTOLIN HFA) 108 (90 BASE) MCG/ACT inhaler, Inhale 2 puffs into the lungs every 6 (six) hours as needed for wheezing., Disp: , Rfl: ;   ALPRAZolam (XANAX) 1 MG tablet, Take 1 mg by mouth at bedtime., Disp: , Rfl:  amLODipine (NORVASC) 10 MG tablet, Take 10 mg by mouth every morning., Disp: , Rfl: ;   budesonide-formoterol (SYMBICORT) 160-4.5 MCG/ACT inhaler, Inhale 2 puffs into the lungs 2 (two) times daily as needed (Shortness of breath)., Disp: , Rfl: ;   Cholecalciferol (VITAMIN D) 2000 UNITS CAPS, Take 1 capsule by mouth every evening., Disp: , Rfl: ;   furosemide (LASIX) 20 MG tablet, Take 1 tablet (20 mg total) by mouth daily., Disp: 90 tablet, Rfl: 3 ibuprofen (ADVIL,MOTRIN) 200 MG tablet, Take 800 mg by mouth every 6 (six) hours as needed (Pain)., Disp: , Rfl: ;   lisinopril (PRINIVIL,ZESTRIL) 20 MG tablet,  Take 20 mg by mouth every morning., Disp: , Rfl: ;   Melatonin 10 MG CAPS, Take 20 mg by mouth at bedtime., Disp: , Rfl: ;   montelukast (SINGULAIR) 10 MG tablet, Take 10 mg by mouth at bedtime., Disp: , Rfl:  oxyCODONE-acetaminophen (PERCOCET) 10-325 MG per tablet, Take 1 tablet by mouth every 6 (six) hours as needed for pain., Disp: , Rfl: ;   Polyvinyl Alcohol-Povidone (REFRESH OP), Apply 1 drop to eye as needed (Dry eyes)., Disp: , Rfl: ;  pravastatin (PRAVACHOL) 40 MG tablet, Take 40 mg by mouth daily., Disp: , Rfl: ;   promethazine (PHENERGAN) 25 MG tablet, Take 25 mg by mouth every 6 (six) hours as needed for nausea., Disp: , Rfl:  sertraline (ZOLOFT) 100 MG tablet, Take 100 mg by mouth 2 (two) times daily. , Disp: , Rfl: ;   zolpidem (AMBIEN CR) 12.5 MG CR tablet, Take 12.5 mg by mouth at bedtime., Disp: , Rfl:   Review of Systems  Constitutional: Negative.   HENT: Negative.   Eyes: Negative.   Cardiovascular:  Negative.   Gastrointestinal: Negative.   Genitourinary: Negative.   Musculoskeletal: Positive for back pain, falls and joint pain. Negative for myalgias and neck pain.       Right knee pain  Skin: Negative.   Neurological: Negative.   Endo/Heme/Allergies: Negative.   Psychiatric/Behavioral: Negative.    Vitals Weight: 296 lb Height: 65 in Body Surface Area: 2.48 m Body Mass Index: 49.26 kg/m BP: 122/90 (Sitting, Left Arm, Standard)  HR: 84 bpm  Physical Exam  Constitutional: She is oriented to person, place, and time. She appears well-developed and well-nourished. No distress.  Morbidly obese  HENT:  Head: Normocephalic and atraumatic.  Right Ear: External ear normal.  Left Ear: External ear normal.  Mouth/Throat: Oropharynx is clear and moist.  Eyes: Conjunctivae and EOM are normal.  Neck: Normal range of motion. Neck supple.  Cardiovascular: Normal rate, regular rhythm, normal heart sounds and intact distal pulses.   No murmur heard. Respiratory: Effort normal and breath sounds normal. No respiratory distress. She has no wheezes.  GI: Soft. Bowel sounds are normal. She exhibits no distension. There is no tenderness.  Musculoskeletal:       Right hip: Normal.       Left hip: Normal.       Right knee: She exhibits decreased range of motion, swelling and effusion. She exhibits no erythema. Tenderness found. Medial joint line and lateral joint line tenderness noted.       Left knee: Normal.       Right lower leg: She exhibits no tenderness and no swelling.       Left lower leg: She exhibits no tenderness and no swelling.  Neurological: She is alert and oriented to person, place, and time. She has normal strength and normal reflexes. No sensory deficit.  Skin: No rash noted. She is not diaphoretic. No erythema.  Psychiatric: She has a normal mood and affect. Her behavior is normal.     Assessment/Plan Right knee medial meniscus tear with necrosis of proximal  tibia She needs to have arthroscopic examination of the right knee with partial medial meniscectomy, and an open window removed from the proximal tibia and bone grafting of cancellous bone. Risks and benefits of the procedure were discussed with the patient and her family by Dr. Darrelyn HillockGioffre.   Dawn Convery LAUREN 10/22/2013, 8:19 AM

## 2013-10-23 ENCOUNTER — Encounter (HOSPITAL_COMMUNITY): Payer: Self-pay | Admitting: Orthopedic Surgery

## 2013-10-23 LAB — BASIC METABOLIC PANEL
BUN: 19 mg/dL (ref 6–23)
CO2: 24 mEq/L (ref 19–32)
Calcium: 8.9 mg/dL (ref 8.4–10.5)
Chloride: 100 mEq/L (ref 96–112)
Creatinine, Ser: 0.94 mg/dL (ref 0.50–1.10)
GFR calc Af Amer: 81 mL/min — ABNORMAL LOW (ref >90)
GFR calc non Af Amer: 70 mL/min — ABNORMAL LOW (ref >90)
Glucose, Bld: 112 mg/dL — ABNORMAL HIGH (ref 70–99)
Potassium: 4.2 mEq/L (ref 3.7–5.3)
Sodium: 137 mEq/L (ref 137–147)

## 2013-10-23 MED ORDER — OXYCODONE HCL 5 MG PO TABS: 5.0000 mg | ORAL_TABLET | ORAL | Status: DC | PRN

## 2013-10-23 MED ORDER — ENOXAPARIN SODIUM 40 MG/0.4ML ~~LOC~~ SOLN
40.0000 mg | SUBCUTANEOUS | Status: DC
Start: 2013-10-23 — End: 2013-12-25

## 2013-10-23 MED ORDER — METHOCARBAMOL 500 MG PO TABS: 500.0000 mg | ORAL_TABLET | Freq: Four times a day (QID) | ORAL | Status: DC | PRN

## 2013-10-23 NOTE — Progress Notes (Signed)
   CARE MANAGEMENT NOTE 10/23/2013  Patient:  [name],[name]   Account Number:  [id number]  Date Initiated:  10/23/2013  Documentation initiated by:  Uc RegentsHAVIS,Trina Asch  Subjective/Objective Assessment:   RIGHT KNEE ARTHROSCOPY WITH MEDIAL MENISECTOMY     Action/Plan:   HH   Anticipated DC Date:  10/24/2013   Anticipated DC Plan:  HOME W HOME HEALTH SERVICES      DC Planning Services  CM consult      [facility]   Choice offered to / List presented to:  C-1 Patient        HH arranged  HH-2 PT      [facility]   Status of service:  Completed, signed off Medicare Important Message given?   (If response is "NO", the following Medicare IM given date fields will be blank) Date Medicare IM given:   Date Additional Medicare IM given:    Discharge Disposition:  HOME W HOME HEALTH SERVICES  Per UR Regulation:    If discussed at Long Length of Stay Meetings, dates discussed:    Comments:  10/23/2013 1310 [facility] to pt [facility]. Pt states she has RW at home. States she will decide on 3n1 prior to dc. Waiting PT/OT evaluation. Morgan DonningAlesia Kashon Kraynak RN CCM Case Mgmt phone [phone]

## 2013-10-23 NOTE — Discharge Instructions (Signed)
Walk with your walker. Toe touch weightbearing right LE with assistance of a walker Continue Lovenox injections for one week. When completed start aspirin 325mg  twice daily for DVT prophylaxis. Change your dressing daily. Shower only, no tub bath. Call if any temperatures greater than 101 or any wound complications: [phone] during the day and ask for Dr. Jeannetta EllisGioffre's nurse, Mackey Birchwoodammy Johnson.

## 2013-10-23 NOTE — Evaluation (Signed)
Physical Therapy Evaluation Patient Details Name: [name] MRN: [id number] DOB: 06-07-1961 Today's Date: 10/23/2013   History of Present Illness     Clinical Impression  Pt s/p R knee arthroscopy with medial meniscectomy presents with functional mobility limitations 2* decreased R LE strength/ROM, post op pain, obesity and TTWB status on R.  Pt should progress to d/c home with family assist and HHPT follow up    Follow Up Recommendations Home health PT    Equipment Recommendations  Wheelchair (measurements PT)    Recommendations for Other Services OT consult     Precautions / Restrictions Precautions Precautions: Fall Restrictions Weight Bearing Restrictions: Yes RLE Weight Bearing: Touchdown weight bearing      Mobility  Bed Mobility Overal bed mobility: + 2 for safety/equipment;Needs Assistance;+2 for physical assistance Bed Mobility: Supine to Sit     Supine to sit: +2 for physical assistance;+2 for safety/equipment;Mod assist     General bed mobility comments: cues for sequence with min assist for R LE management and assist to bring trunk to upright position  Transfers Overall transfer level: Needs assistance Equipment used: Rolling walker (2 wheeled) Transfers: Sit to/from Stand Sit to Stand: +2 physical assistance;+2 safety/equipment;Mod assist         General transfer comment: cues for LE management and use of UEs to self assist  Ambulation/Gait Ambulation/Gait assistance: +2 physical assistance;+2 safety/equipment;Mod assist Ambulation Distance (Feet): 29 Feet Assistive device: Rolling walker (2 wheeled) Gait Pattern/deviations: Step-to pattern;Decreased step length - right;Decreased step length - left;Shuffle;Antalgic;Trunk flexed Gait velocity: decr   General Gait Details: Cues for posture, sequence, position from RW, step-to gait and TTWB on R  Stairs            Wheelchair Mobility    Modified Rankin (Stroke Patients Only)        Balance                                             Pertinent Vitals/Pain 7/10; premed, RN aware, cold packs provided    Home Living Family/patient expects to be discharged to:: Private residence Living Arrangements: Spouse/significant other Available Help at Discharge: Family Type of Home: House Home Access: Stairs to enter Entrance Stairs-Rails: None Secretary/administratorntrance Stairs-Number of Steps: 2 Home Layout: One level Home Equipment: Environmental consultantWalker - 2 wheels      Prior Function Level of Independence: Independent               Hand Dominance   Dominant Hand: Right    Extremity/Trunk Assessment   Upper Extremity Assessment: Overall WFL for tasks assessed           Lower Extremity Assessment: RLE deficits/detail RLE Deficits / Details: 3-/5 quads with AAROM at knee -10 - 75       Communication   Communication: No difficulties  Cognition Arousal/Alertness: Awake/alert Behavior During Therapy: WFL for tasks assessed/performed Overall Cognitive Status: Within Functional Limits for tasks assessed                      General Comments      Exercises        Assessment/Plan    PT Assessment Patient needs continued PT services  PT Diagnosis Difficulty walking   PT Problem List Decreased strength;Decreased range of motion;Decreased activity tolerance;Decreased mobility;Decreased knowledge of use of DME;Obesity;Pain  PT Treatment Interventions DME  instruction;Gait training;Stair training;Functional mobility training;Therapeutic activities;Therapeutic exercise;Patient/family education   PT Goals (Current goals can be found in the Care Plan section) Acute Rehab PT Goals Patient Stated Goal: Be able to function at home until I can put more wt on it PT Goal Formulation: With patient Time For Goal Achievement: 10/30/13 Potential to Achieve Goals: Good    Frequency 7X/week   Barriers to discharge        Co-evaluation                End of Session Equipment Utilized During Treatment: Gait belt Activity Tolerance: Patient tolerated treatment well;Patient limited by pain;Patient limited by fatigue Patient left: in chair;with call bell/phone within reach;with family/visitor present Nurse Communication: Mobility status         Time: 1610-9604 PT Time Calculation (min): 28 min   Charges:   PT Evaluation $Initial PT Evaluation Tier I: 1 Procedure PT Treatments $Gait Training: 8-22 mins $Therapeutic Activity: 8-22 mins   PT G Codes:          Allyn Bertoni 10/23/2013, 4:50 PM

## 2013-10-23 NOTE — Progress Notes (Signed)
Subjective: 1 Day Post-Op Procedure(s) (LRB): RIGHT KNEE ARTHROSCOPY WITH MEDIAL MENISECTOMY, abrasion chrondrplasty /CLEAN OUT/CORRECT CURETTEMENT/BONE GRAFT/PROXIMAL TIBIA  (Right) Patient reports pain as 2 on 0-10 scale.Doing well today.Will get up toe-touch weight on right.Awaiting Path specimen.    Objective: Vital signs in last 24 hours: Temp:  [97 F (36.1 C)-98.7 F (37.1 C)] 97.8 F (36.6 C) (04/01 0640) Pulse Rate:  [78-106] 80 (04/01 0640) Resp:  [8-21] 16 (04/01 0640) BP: (117-160)/(59-90) 149/79 mmHg (04/01 0640) SpO2:  [94 %-100 %] 98 % (04/01 0640) Weight:  [135.535 kg (298 lb 12.8 oz)] 135.535 kg (298 lb 12.8 oz) (03/31 0904)  Intake/Output from previous day: 03/31 0701 - 04/01 0700 In: 3095 [P.O.:240; I.V.:2750; IV Piggyback:105] Out: 1100 [Urine:1100] Intake/Output this shift:     Recent Labs  10/22/13 0925  HGB 12.9    Recent Labs  10/22/13 0925  WBC 9.6  RBC 4.24  HCT 38.5  PLT 260    Recent Labs  10/22/13 0925 10/23/13 0408  NA 138 137  K 4.0 4.2  CL 100 100  CO2 23 24  BUN 22 19  CREATININE 0.90 0.94  GLUCOSE 105* 112*  CALCIUM 9.9 8.9   No results found for this basename: LABPT, INR,  in the last 72 hours  Dorsiflexion/Plantar flexion intact  Assessment/Plan: 1 Day Post-Op Procedure(s) (LRB): RIGHT KNEE ARTHROSCOPY WITH MEDIAL MENISECTOMY, abrasion chrondrplasty /CLEAN OUT/CORRECT CURETTEMENT/BONE GRAFT/PROXIMAL TIBIA  (Right) Up with therapy. DC plans for tomorrow. Will check on Path report. See in office Monday.  Quanika Solem A 10/23/2013, 7:55 AM

## 2013-10-24 LAB — BASIC METABOLIC PANEL
BUN: 17 mg/dL (ref 6–23)
CO2: 26 mEq/L (ref 19–32)
Calcium: 8.7 mg/dL (ref 8.4–10.5)
Chloride: 102 mEq/L (ref 96–112)
Creatinine, Ser: 0.93 mg/dL (ref 0.50–1.10)
GFR calc Af Amer: 82 mL/min — ABNORMAL LOW (ref >90)
GFR calc non Af Amer: 70 mL/min — ABNORMAL LOW (ref >90)
Glucose, Bld: 99 mg/dL (ref 70–99)
Potassium: 4.3 mEq/L (ref 3.7–5.3)
Sodium: 138 mEq/L (ref 137–147)

## 2013-10-24 NOTE — Progress Notes (Addendum)
   Subjective: 2 Days Post-Op Procedure(s) (LRB): RIGHT KNEE ARTHROSCOPY WITH MEDIAL MENISECTOMY, abrasion chrondrplasty /CLEAN OUT/CORRECT CURETTEMENT/BONE GRAFT/PROXIMAL TIBIA  (Right) Patient reports pain as mild.   Patient seen in rounds without Dr. Darrelyn HillockGioffre. Patient is well, and has had no acute complaints or problems. She reports that she slept well last night. Pain is under good control. She reports that PT has gone well. She denies SOB and chest pain. No issues overnight. She feels ready to go home today.  Plan is to go Home after hospital stay.  Objective: Vital signs in last 24 hours: Temp:  [98.1 F (36.7 C)-98.6 F (37 C)] 98.6 F (37 C) (04/02 0543) Pulse Rate:  [72-82] 80 (04/02 0543) Resp:  [16] 16 (04/02 0543) BP: (113-152)/(72-83) 113/72 mmHg (04/02 0543) SpO2:  [95 %-98 %] 98 % (04/02 0543)  Intake/Output from previous day:  Intake/Output Summary (Last 24 hours) at 10/24/13 0720 Last data filed at 10/24/13 0500  Gross per 24 hour  Intake   1045 ml  Output    250 ml  Net    795 ml     Labs:  Recent Labs  10/22/13 0925  HGB 12.9    Recent Labs  10/22/13 0925  WBC 9.6  RBC 4.24  HCT 38.5  PLT 260    Recent Labs  10/23/13 0408 10/24/13 0402  NA 137 138  K 4.2 4.3  CL 100 102  CO2 24 26  BUN 19 17  CREATININE 0.94 0.93  GLUCOSE 112* 99  CALCIUM 8.9 8.7    EXAM General - Patient is Alert and Oriented Extremity - Neurologically intact Neurovascular intact Dorsiflexion/Plantar flexion intact No cellulitis present Compartment soft Dressing/Incision - clean, dry, no drainage Motor Function - intact, moving foot and toes well on exam.   Past Medical History  Diagnosis Date  . Chronic back pain   . Anxiety   . Depression   . Hypertension   . High cholesterol   . GERD (gastroesophageal reflux disease)   . Colon polyps   . Asthma   . Trigger finger of both hands 2007    thumb  . PONV (postoperative nausea and vomiting)   .  Migraines     hx of   . Arthritis   . Anemia     hx of with pregnancy   . Sleep apnea     does not use CPAP machine     Assessment/Plan: 2 Days Post-Op Procedure(s) (LRB): RIGHT KNEE ARTHROSCOPY WITH MEDIAL MENISECTOMY, abrasion chrondrplasty /CLEAN OUT/CORRECT CURETTEMENT/BONE GRAFT/PROXIMAL TIBIA  (Right) Active Problems:   Aneurysmal bone cyst, other site   Osteoarthritis of right knee   Complex tear of medial meniscus of right knee as current injury  Estimated body mass index is 51.26 kg/(m^2) as calculated from the following:   Height as of this encounter: 5\' 4"  (1.626 m).   Weight as of this encounter: 135.535 kg (298 lb 12.8 oz). Advance diet Up with therapy D/C IV fluids Discharge home with home health  DVT Prophylaxis - Lovenox Toe touch weightbearing right LE  Continue PT this morning. Discharge after PT. Patient needs shower stool for bathing at home. Discharge instructions discussed with the patient. Will see in the office next Monday.   Uchechukwu Dhawan LAUREN 10/24/2013, 7:20 AM

## 2013-10-24 NOTE — Evaluation (Signed)
Occupational Therapy Evaluation Patient Details Name: [name] MRN: [id number] DOB: 02-18-1961 Today's Date: 10/24/2013    History of Present Illness RIGHT KNEE ARTHROSCOPY WITH MEDIAL MENISECTOMY, abrasion chrondrplasty [name] OUT/CORRECT CURETTEMENT/BONE GRAFT/PROXIMAL TIBIA  (Right   Clinical Impression   Pt is planning d/c for today. They feel they can borrow a tub transfer bench for home and have 3in1 delivered to room already. Educated pt on AE and DME safety. Husband present and assisting with ADL. Recommend HHOT follow.    Follow Up Recommendations  Home health OT;Supervision/Assistance - 24 hour    Equipment Recommendations  3 in 1 bedside comode (in room)    Recommendations for Other Services       Precautions / Restrictions Precautions Precautions: Fall Restrictions Weight Bearing Restrictions: Yes RLE Weight Bearing: Touchdown weight bearing      Mobility Bed Mobility                  Transfers Overall transfer level: Needs assistance Equipment used: Rolling walker (2 wheeled) Transfers: Sit to/from Stand Sit to Stand: Min assist         General transfer comment: verbal cues for hand placement and LE management.    Balance                                            ADL Overall ADL's : Needs assistance/impaired Eating/Feeding: Independent;Sitting   Grooming: Set up;Sitting   Upper Body Bathing: Set up;Sitting   Lower Body Bathing: Moderate assistance;Maximal assistance;Sit to/from stand   Upper Body Dressing : Minimal assistance;Sitting (fasten bra)   Lower Body Dressing: Moderate assistance;With adaptive equipment;Sit to/from stand (used reacher to don underwear and pants. Husband pulled them up in standing as pt felt she needed to hold to walker to maintain TDWB on R.   Toilet Transfer: Minimal assistance;RW   Toileting- Clothing Manipulation and Hygiene: Maximal assistance;Sit to/from stand        Functional mobility during ADLs: Rolling walker General ADL Comments: Husband present for session. Pt fatigued after just working with PT on stairs. Pt supposed to d/c today. She states she feels comfortable with 3in1 transfer but discussed how to adjust for appropriate height. She states she doesnt feel comfortable with letting go of the walker to help with clothing management yet with TDWB R LE so pt stood and husband assisted with pulling up pants while pt held to walker. Pt used reacher to don pants and underwear over feet but states he can help with socks and shoes. Discussed tub transfers and that pt is not allowed to step in tub right now with weight bearing status and recommended tub transfer bench. Spouse states he thinks they have one they can borrow. Demonstrated technique and recommend HHOT for reinforcement/practice with bench and other ADL.      Vision                     Perception     Praxis      Pertinent Vitals/Pain 6/10 R knee; reposition.     Hand Dominance Right   Extremity/Trunk Assessment Upper Extremity Assessment Upper Extremity Assessment: Overall WFL for tasks assessed           Communication Communication Communication: No difficulties   Cognition Arousal/Alertness: Awake/alert Behavior During Therapy: WFL for tasks assessed/performed Overall Cognitive Status: Within Functional Limits for tasks  assessed                     General Comments       Exercises       Shoulder Instructions      Home Living Family/patient expects to be discharged to:: Private residence Living Arrangements: Spouse/significant other Available Help at Discharge: Family Type of Home: House Home Access: Stairs to enter Secretary/administratorntrance Stairs-Number of Steps: 2 Entrance Stairs-Rails: None Home Layout: One level     Bathroom Shower/Tub: Chief Strategy OfficerTub/shower unit   Bathroom Toilet: Standard     Home Equipment: Environmental consultantWalker - 2 wheels;Adaptive equipment Adaptive  Equipment: Reacher        Prior Functioning/Environment Level of Independence: Independent             OT Diagnosis:     OT Problem List: Decreased strength;Decreased knowledge of use of DME or AE   OT Treatment/Interventions:      OT Goals(Current goals can be found in the care plan section) Acute Rehab OT Goals Patient Stated Goal: Be able to function at home until I can put more wt on it  OT Frequency:     Barriers to D/C:            Co-evaluation              End of Session Equipment Utilized During Treatment: Rolling walker  Activity Tolerance: Patient limited by pain Patient left: in chair;with call bell/phone within reach;with family/visitor present   Time: 1130-1205 OT Time Calculation (min): 35 min Charges:  OT General Charges $OT Visit: 1 Procedure OT Evaluation $Initial OT Evaluation Tier I: 1 Procedure OT Treatments $Self Care/Home Management : 8-22 mins $Therapeutic Activity: 8-22 mins G-Codes:    Morgan LaityStone, [name] [phone] 10/24/2013, 12:18 PM

## 2013-10-24 NOTE — Progress Notes (Signed)
Utilization review completed.  

## 2013-10-24 NOTE — Progress Notes (Signed)
[facility] providing the following services: Tub seat, Commode, Wheelchair (tub seat and w/c will be delivered to the home)  If patient discharges after hours, please call [phone].   Renard HamperLecretia Williamson 10/24/2013, 12:44 PM

## 2013-10-24 NOTE — Progress Notes (Signed)
Physical Therapy Treatment Patient Details Name: [name] MRN: [id number] DOB: May 14, 1961 Today's Date: 10/24/2013    History of Present Illness RIGHT KNEE ARTHROSCOPY WITH MEDIAL MENISECTOMY, abrasion chrondrplasty Brigid Re OUT/CORRECT CURETTEMENT/BONE GRAFT/PROXIMAL TIBIA  (Right    PT Comments    Good progress from last visit.  Pt continues limited by TWB status and wt issue as well as limited endurance.  Follow Up Recommendations  Home health PT     Equipment Recommendations  Wheelchair (measurements PT)    Recommendations for Other Services OT consult     Precautions / Restrictions Precautions Precautions: Fall Restrictions Weight Bearing Restrictions: Yes RLE Weight Bearing: Touchdown weight bearing    Mobility  Bed Mobility Overal bed mobility: Needs Assistance Bed Mobility: Supine to Sit     Supine to sit: HOB elevated;Min guard     General bed mobility comments: cues for sequence and to educate pt on use of sheet to self assist R LE  Transfers Overall transfer level: Needs assistance Equipment used: Rolling walker (2 wheeled) Transfers: Sit to/from Stand Sit to Stand: Min assist         General transfer comment: verbal cues for hand placement and LE management.  Ambulation/Gait Ambulation/Gait assistance: Min assist;Min guard Ambulation Distance (Feet): 30 Feet Assistive device: Rolling walker (2 wheeled) Gait Pattern/deviations: Step-to pattern;Decreased step length - right;Decreased step length - left;Shuffle;Trunk flexed Gait velocity: decr   General Gait Details: Cues for posture, sequence, position from RW, step-to gait and TTWB on R   Stairs Stairs: Yes Stairs assistance: +2 safety/equipment Stair Management: No rails;Step to pattern;Backwards;With walker Number of Stairs: 2 (twice) General stair comments: cues for sequence and foot/RW placement.  Pt spouse assisting pt on second attempt.  Spouse states 2nd person will be  available to assist at home for saftey  Wheelchair Mobility    Modified Rankin (Stroke Patients Only)       Balance                                    Cognition Arousal/Alertness: Awake/alert Behavior During Therapy: WFL for tasks assessed/performed Overall Cognitive Status: Within Functional Limits for tasks assessed                      Exercises General Exercises - Lower Extremity Ankle Circles/Pumps: AROM;Both;15 reps;Supine Quad Sets: AROM;Both;10 reps;Supine Heel Slides: AAROM;15 reps;Supine;Right Straight Leg Raises: AAROM;Right;10 reps;Supine    General Comments        Pertinent Vitals/Pain 6/10; premed, ice packs provided    Home Living Family/patient expects to be discharged to:: Private residence Living Arrangements: Spouse/significant other Available Help at Discharge: Family Type of Home: House Home Access: Stairs to enter Entrance Stairs-Rails: None Home Layout: One level Home Equipment: Environmental consultant - 2 wheels;Adaptive equipment      Prior Function Level of Independence: Independent          PT Goals (current goals can now be found in the care plan section) Acute Rehab PT Goals Patient Stated Goal: Be able to function at home until I can put more wt on it PT Goal Formulation: With patient Time For Goal Achievement: 10/30/13 Potential to Achieve Goals: Good Progress towards PT goals: Progressing toward goals    Frequency  7X/week    PT Plan Current plan remains appropriate    Co-evaluation             End  of Session Equipment Utilized During Treatment: Gait belt Activity Tolerance: Patient tolerated treatment well;Patient limited by fatigue Patient left: in chair;with call bell/phone within reach;with family/visitor present     Time: 1100-1133 PT Time Calculation (min): 33 min  Charges:  $Gait Training: 8-22 mins $Therapeutic Exercise: 8-22 mins                    G Codes:      [name] 10/24/2013,  12:51 PM

## 2013-10-30 ENCOUNTER — Telehealth: Payer: Self-pay | Admitting: Family Medicine

## 2013-10-30 MED ORDER — PRAVASTATIN SODIUM 40 MG PO TABS: 40.0000 mg | ORAL_TABLET | Freq: Every day | ORAL | Status: DC

## 2013-10-30 NOTE — Telephone Encounter (Signed)
[id number] - [place], [place] - [address] is requesting re-fill on pravastatin (PRAVACHOL) 40 MG tablet

## 2013-10-30 NOTE — Telephone Encounter (Signed)
RX sent to pharmacy  

## 2013-10-31 NOTE — Discharge Summary (Signed)
Physician Discharge Summary   Patient ID: [name] MRN: [id number] DOB/AGE: 28-Jan-1961 53 y.o.  Admit date: 10/22/2013 Discharge date: 10/24/2013  Primary Diagnosis: Avascular necrosis, proximal tibia , right   Medial meniscus tear, right knee  Admission Diagnoses:  Past Medical History  Diagnosis Date  . Chronic back pain   . Anxiety   . Depression   . Hypertension   . High cholesterol   . GERD (gastroesophageal reflux disease)   . Colon polyps   . Asthma   . Trigger finger of both hands 2007    thumb  . PONV (postoperative nausea and vomiting)   . Migraines     hx of   . Arthritis   . Anemia     hx of with pregnancy   . Sleep apnea     does not use CPAP machine    Discharge Diagnoses:   Active Problems:   Aneurysmal bone cyst, other site   Osteoarthritis of right knee   Complex tear of medial meniscus of right knee as current injury  Estimated body mass index is 51.26 kg/(m^2) as calculated from the following:   Height as of this encounter: _0  (1.626 m).   Weight as of this encounter: 135.535 kg (298 lb 12.8 oz).  Procedure:  Procedure(s) (LRB): RIGHT KNEE ARTHROSCOPY WITH MEDIAL MENISECTOMY, abrasion chrondrplasty /CLEAN OUT/CORRECT CURETTEMENT/BONE GRAFT/PROXIMAL TIBIA  (Right)   Consults: None  HPI: The patient is a 53 year old female who has been treated for several months for her continued with knee pain. She was initially treated for her right knee medial meniscus tear. She had a right knee arthroscopy with medial meniscus debridement. She continued to have pain following recovery of that surgery. She has cortisone injections with no improvement. Repeat MRI showed another medial meniscus tear as well as an area of AVN over the proximal tibia.   Laboratory Data: Admission on 10/22/2013, Discharged on 10/24/2013  Component Date Value Ref Range Status  . WBC 10/22/2013 9.6  4.0 - 10.5 K/uL Final  . RBC 10/22/2013 4.24  3.87 - 5.11 MIL/uL Final    . Hemoglobin 10/22/2013 12.9  12.0 - 15.0 g/dL Final  . HCT 10/22/2013 38.5  36.0 - 46.0 % Final  . MCV 10/22/2013 90.8  78.0 - 100.0 fL Final  . MCH 10/22/2013 30.4  26.0 - 34.0 pg Final  . MCHC 10/22/2013 33.5  30.0 - 36.0 g/dL Final  . RDW 10/22/2013 13.6  11.5 - 15.5 % Final  . Platelets 10/22/2013 260  150 - 400 K/uL Final  . Sodium 10/22/2013 138  137 - 147 mEq/L Final  . Potassium 10/22/2013 4.0  3.7 - 5.3 mEq/L Final  . Chloride 10/22/2013 100  96 - 112 mEq/L Final  . CO2 10/22/2013 23  19 - 32 mEq/L Final  . Glucose, Bld 10/22/2013 105* 70 - 99 mg/dL Final  . BUN 10/22/2013 22  6 - 23 mg/dL Final  . Creatinine, Ser 10/22/2013 0.90  0.50 - 1.10 mg/dL Final  . Calcium 10/22/2013 9.9  8.4 - 10.5 mg/dL Final  . GFR calc non Af Amer 10/22/2013 73* >90 mL/min Final  . GFR calc Af Amer 10/22/2013 85* >90 mL/min Final   Comment: (NOTE)                          The eGFR has been calculated using the CKD EPI equation.  This calculation has not been validated in all clinical situations.                          eGFR's persistently <90 mL/min signify possible Chronic Kidney                          Disease.  . Sodium 10/23/2013 137  137 - 147 mEq/L Final  . Potassium 10/23/2013 4.2  3.7 - 5.3 mEq/L Final  . Chloride 10/23/2013 100  96 - 112 mEq/L Final  . CO2 10/23/2013 24  19 - 32 mEq/L Final  . Glucose, Bld 10/23/2013 112* 70 - 99 mg/dL Final  . BUN 10/23/2013 19  6 - 23 mg/dL Final  . Creatinine, Ser 10/23/2013 0.94  0.50 - 1.10 mg/dL Final  . Calcium 10/23/2013 8.9  8.4 - 10.5 mg/dL Final  . GFR calc non Af Amer 10/23/2013 70* >90 mL/min Final  . GFR calc Af Amer 10/23/2013 81* >90 mL/min Final   Comment: (NOTE)                          The eGFR has been calculated using the CKD EPI equation.                          This calculation has not been validated in all clinical situations.                          eGFR's persistently <90 mL/min signify possible  Chronic Kidney                          Disease.  . Sodium 10/24/2013 138  137 - 147 mEq/L Final  . Potassium 10/24/2013 4.3  3.7 - 5.3 mEq/L Final  . Chloride 10/24/2013 102  96 - 112 mEq/L Final  . CO2 10/24/2013 26  19 - 32 mEq/L Final  . Glucose, Bld 10/24/2013 99  70 - 99 mg/dL Final  . BUN 10/24/2013 17  6 - 23 mg/dL Final  . Creatinine, Ser 10/24/2013 0.93  0.50 - 1.10 mg/dL Final  . Calcium 10/24/2013 8.7  8.4 - 10.5 mg/dL Final  . GFR calc non Af Amer 10/24/2013 70* >90 mL/min Final  . GFR calc Af Amer 10/24/2013 82* >90 mL/min Final   Comment: (NOTE)                          The eGFR has been calculated using the CKD EPI equation.                          This calculation has not been validated in all clinical situations.                          eGFR's persistently <90 mL/min signify possible Chronic Kidney                          Disease.     X-Rays:No results found.  EKG: Orders placed during the hospital encounter of 10/22/13  . EKG 12-LEAD     Hospital Course: Adeliz Tonkinson is a 53 y.o. who was admitted to  [facility]. They were brought to the operating room on 10/22/2013 and underwent Procedure(s): RIGHT KNEE ARTHROSCOPY WITH MEDIAL MENISECTOMY, abrasion chrondrplasty /CLEAN OUT/CORRECT CURETTEMENT/BONE GRAFT/PROXIMAL TIBIA .  Patient tolerated the procedure well and was later transferred to the recovery room and then to the orthopaedic floor for postoperative care.  They were given PO and IV analgesics for pain control following their surgery.  They were given 24 hours of postoperative antibiotics of  Anti-infectives   Start     Dose/Rate Route Frequency Ordered Stop   10/22/13 1800  clindamycin (CLEOCIN) IVPB 600 mg     600 mg 100 mL/hr over 30 Minutes Intravenous Every 6 hours 10/22/13 1642 10/23/13 0655   10/22/13 0915  clindamycin (CLEOCIN) IVPB 900 mg     900 mg 100 mL/hr over 30 Minutes Intravenous On call to O.R. 10/22/13 8850 10/22/13 1236      and started on DVT prophylaxis in the form of Lovenox.   PT and OT were ordered for total joint protocol.  Discharge planning consulted to help with postop disposition and equipment needs.  Patient had a fair night on the evening of surgery.  They started to get up OOB with therapy on day one, TDWB only. Continued to work with therapy into day two.  Dressing was changed on day two and the incision was clean and dry. The patient had progressed with therapy and meeting their goals.  Incision was healing well.  Patient was seen in rounds and was ready to go home.   Discharge Medications: Prior to Admission medications   Medication Sig Start Date End Date Taking? Authorizing Provider  ALPRAZolam Duanne Moron) 1 MG tablet Take 1 mg by mouth at bedtime.   Yes Historical Provider, MD  amLODipine (NORVASC) 10 MG tablet Take 10 mg by mouth every morning.   Yes Historical Provider, MD  Cholecalciferol (VITAMIN D) 2000 UNITS CAPS Take 1 capsule by mouth every evening.   Yes Historical Provider, MD  ibuprofen (ADVIL,MOTRIN) 200 MG tablet Take 800 mg by mouth every 6 (six) hours as needed (Pain).   Yes Historical Provider, MD  lisinopril (PRINIVIL,ZESTRIL) 20 MG tablet Take 20 mg by mouth every morning.   Yes Historical Provider, MD  Melatonin 10 MG CAPS Take 20 mg by mouth at bedtime.   Yes Historical Provider, MD  montelukast (SINGULAIR) 10 MG tablet Take 10 mg by mouth at bedtime.   Yes Historical Provider, MD  Polyvinyl Alcohol-Povidone (REFRESH OP) Apply 1 drop to eye as needed (Dry eyes).   Yes Historical Provider, MD  sertraline (ZOLOFT) 100 MG tablet Take 100 mg by mouth 2 (two) times daily.    Yes Historical Provider, MD  zolpidem (AMBIEN CR) 12.5 MG CR tablet Take 12.5 mg by mouth at bedtime.   Yes Historical Provider, MD  acetaminophen (TYLENOL) 500 MG tablet Take 1,000 mg by mouth every 6 (six) hours as needed (Pain).    Historical Provider, MD  albuterol (PROVENTIL HFA;VENTOLIN HFA) 108 (90 BASE)  MCG/ACT inhaler Inhale 2 puffs into the lungs every 6 (six) hours as needed for wheezing. 01/28/13   Eulas Post, MD  budesonide-formoterol (SYMBICORT) 160-4.5 MCG/ACT inhaler Inhale 2 puffs into the lungs 2 (two) times daily as needed (Shortness of breath).    Historical Provider, MD  enoxaparin (LOVENOX) 40 MG/0.4ML injection Inject 0.4 mLs (40 mg total) into the skin daily. 10/23/13   Loretha Ure Renelda Loma, PA-C  furosemide (LASIX) 20 MG tablet Take 1 tablet (20 mg total) by  mouth daily. 01/02/13   Eulas Post, MD  methocarbamol (ROBAXIN) 500 MG tablet Take 1 tablet (500 mg total) by mouth every 6 (six) hours as needed for muscle spasms. 10/23/13   Lavra Imler Renelda Loma, PA-C  oxyCODONE (OXY IR/ROXICODONE) 5 MG immediate release tablet Take 1-3 tablets (5-15 mg total) by mouth every 4 (four) hours as needed for moderate pain. 10/23/13   Nereyda Bowler Renelda Loma, PA-C  pravastatin (PRAVACHOL) 40 MG tablet Take 1 tablet (40 mg total) by mouth daily. 10/30/13   Eulas Post, MD  promethazine (PHENERGAN) 25 MG tablet Take 25 mg by mouth every 6 (six) hours as needed for nausea.    Historical Provider, MD    Diet: Cardiac diet Activity:TDWB right LE Follow-up:in 5 days Disposition - Home Discharged Condition: stable   Discharge Orders   Future Orders Complete By Expires   Call MD / Call 911  As directed    Constipation Prevention  As directed    Diet general  As directed    Discharge instructions  As directed    Driving restrictions  As directed    Increase activity slowly as tolerated  As directed        Medication List    STOP taking these medications       oxyCODONE-acetaminophen 10-325 MG per tablet  Commonly known as:  PERCOCET      TAKE these medications       acetaminophen 500 MG tablet  Commonly known as:  TYLENOL  Take 1,000 mg by mouth every 6 (six) hours as needed (Pain).     albuterol 108 (90 BASE) MCG/ACT inhaler  Commonly known as:  PROVENTIL HFA;VENTOLIN  HFA  Inhale 2 puffs into the lungs every 6 (six) hours as needed for wheezing.     ALPRAZolam 1 MG tablet  Commonly known as:  XANAX  Take 1 mg by mouth at bedtime.     amLODipine 10 MG tablet  Commonly known as:  NORVASC  Take 10 mg by mouth every morning.     budesonide-formoterol 160-4.5 MCG/ACT inhaler  Commonly known as:  SYMBICORT  Inhale 2 puffs into the lungs 2 (two) times daily as needed (Shortness of breath).     enoxaparin 40 MG/0.4ML injection  Commonly known as:  LOVENOX  Inject 0.4 mLs (40 mg total) into the skin daily.     furosemide 20 MG tablet  Commonly known as:  LASIX  Take 1 tablet (20 mg total) by mouth daily.     ibuprofen 200 MG tablet  Commonly known as:  ADVIL,MOTRIN  Take 800 mg by mouth every 6 (six) hours as needed (Pain).     lisinopril 20 MG tablet  Commonly known as:  PRINIVIL,ZESTRIL  Take 20 mg by mouth every morning.     Melatonin 10 MG Caps  Take 20 mg by mouth at bedtime.     methocarbamol 500 MG tablet  Commonly known as:  ROBAXIN  Take 1 tablet (500 mg total) by mouth every 6 (six) hours as needed for muscle spasms.     montelukast 10 MG tablet  Commonly known as:  SINGULAIR  Take 10 mg by mouth at bedtime.     oxyCODONE 5 MG immediate release tablet  Commonly known as:  Oxy IR/ROXICODONE  Take 1-3 tablets (5-15 mg total) by mouth every 4 (four) hours as needed for moderate pain.     promethazine 25 MG tablet  Commonly known as:  PHENERGAN  Take 25 mg  by mouth every 6 (six) hours as needed for nausea.     REFRESH OP  Apply 1 drop to eye as needed (Dry eyes).     sertraline 100 MG tablet  Commonly known as:  ZOLOFT  Take 100 mg by mouth 2 (two) times daily.     Vitamin D 2000 UNITS Caps  Take 1 capsule by mouth every evening.     zolpidem 12.5 MG CR tablet  Commonly known as:  AMBIEN CR  Take 12.5 mg by mouth at bedtime.           Follow-up Information   Follow up with GIOFFRE,RONALD A, MD. Schedule an  appointment as soon as possible for a visit in 3 days.   Specialty:  Orthopedic Surgery   Contact information:   [address] [place] [address] [phone]       Follow up with [facility])    Contact information:   [address] [place] [address] [phone]       Signed: Malachy Chamber 10/31/2013, 3:40 PM

## 2013-11-11 ENCOUNTER — Telehealth: Payer: Self-pay | Admitting: Family Medicine

## 2013-11-11 NOTE — Telephone Encounter (Signed)
Last visit 08/13/13

## 2013-11-11 NOTE — Telephone Encounter (Signed)
Who has been prescribing these meds? (ie who is her "therapist")? We cannot have multiple providers prescribing controlled meds.

## 2013-11-11 NOTE — Telephone Encounter (Signed)
Pt had knee surgery march 31. Pt states her therapist who rx her ALPRAZolam (XANAX) 1 MG tablet and zolpidem (AMBIEN CR) 12.5 MG CR tablet will not give her a refill until she comes for appt. Pt states she doesn't have a way to get there bc she can't drive right now and no one to take her. She was hoping dr Caryl Neverburchette would fill these meds for her.  [facility]

## 2013-11-12 NOTE — Telephone Encounter (Signed)
Dr[facility] Anne Fulay [facility]ugart at [facility]. Pt would like a month worth if possible..Marland Kitchen

## 2013-11-12 NOTE — Telephone Encounter (Signed)
The requested meds are controlled and should be through her psychiatrist only.

## 2013-11-13 NOTE — Telephone Encounter (Signed)
Pt informed

## 2013-11-13 NOTE — Telephone Encounter (Signed)
Left message for patient to return call.

## 2013-11-14 ENCOUNTER — Other Ambulatory Visit: Payer: Self-pay | Admitting: Family Medicine

## 2013-11-24 ENCOUNTER — Other Ambulatory Visit: Payer: Self-pay | Admitting: Family Medicine

## 2013-12-10 ENCOUNTER — Ambulatory Visit: Payer: BC Managed Care – PPO | Admitting: Family Medicine

## 2013-12-13 ENCOUNTER — Ambulatory Visit: Payer: BC Managed Care – PPO | Admitting: Family Medicine

## 2013-12-18 ENCOUNTER — Telehealth: Payer: Self-pay | Admitting: Family Medicine

## 2013-12-18 NOTE — Telephone Encounter (Signed)
Error/call transferred to CAN

## 2013-12-18 NOTE — Telephone Encounter (Signed)
RN attempted to contact patient, line busy.

## 2013-12-18 NOTE — Telephone Encounter (Signed)
Patient Information:  Caller Name: [name]  Phone: [phone]  Patient: [name], [name]  Gender: Female  DOB: 03/30/1961  Age: 53 Years  PCP: Evelena Peat [facility])  Pregnant: No  Office Follow Up:  Does the office need to follow up with this patient?: No  Instructions For The Office: N/A   Symptoms  Reason For Call & Symptoms: Pt states she had a episode Tuesday 12/17/13 and pt can not remember anything she did.  Pt states a neighbor contacted the family.  Pt reports she woke this morning and feeling normal but no memory of yesterday.  Reviewed Health History In EMR: Yes  Reviewed Medications In EMR: Yes  Reviewed Allergies In EMR: Yes  Reviewed Surgeries / Procedures: Yes  Date of Onset of Symptoms: 12/17/2013 OB / GYN:  LMP: Unknown  Guideline(s) Used:  Confusion - Delirium  Disposition Per Guideline:   See Today in Office  Reason For Disposition Reached:   Transient confusion (i.e., completely resolved)  Advice Given:  Call Back If:  You (i.e., patient, family member) become worse.  Patient Will Follow Care Advice:  YES  Appointment Scheduled:  12/19/2013 09:00:00 Appointment Scheduled Provider:  Selena Batten (TEXT 1st, after 20 mins can call), [facility])

## 2013-12-19 ENCOUNTER — Encounter: Payer: Self-pay | Admitting: Family Medicine

## 2013-12-19 NOTE — Progress Notes (Signed)
Error   This encounter was created in error - please disregard. 

## 2013-12-25 ENCOUNTER — Encounter: Payer: Self-pay | Admitting: Family Medicine

## 2013-12-25 ENCOUNTER — Ambulatory Visit (INDEPENDENT_AMBULATORY_CARE_PROVIDER_SITE_OTHER): Payer: BC Managed Care – PPO | Admitting: Family Medicine

## 2013-12-25 ENCOUNTER — Ambulatory Visit: Payer: BC Managed Care – PPO | Admitting: Family Medicine

## 2013-12-25 VITALS — BP 126/80 | HR 96 | Temp 97.9°F | Wt 294.0 lb

## 2013-12-25 DIAGNOSIS — E785 Hyperlipidemia, unspecified: Secondary | ICD-10-CM

## 2013-12-25 DIAGNOSIS — R413 Other amnesia: Secondary | ICD-10-CM

## 2013-12-25 LAB — CBC WITH DIFFERENTIAL/PLATELET
Basophils Absolute: 0 10*3/uL (ref 0.0–0.1)
Basophils Relative: 0.4 % (ref 0.0–3.0)
Eosinophils Absolute: 0.2 10*3/uL (ref 0.0–0.7)
Eosinophils Relative: 2.9 % (ref 0.0–5.0)
HCT: 39.4 % (ref 36.0–46.0)
Hemoglobin: 13.2 g/dL (ref 12.0–15.0)
Lymphocytes Relative: 23.3 % (ref 12.0–46.0)
Lymphs Abs: 2 10*3/uL (ref 0.7–4.0)
MCHC: 33.4 g/dL (ref 30.0–36.0)
MCV: 88.9 fl (ref 78.0–100.0)
Monocytes Absolute: 0.7 10*3/uL (ref 0.1–1.0)
Monocytes Relative: 8.1 % (ref 3.0–12.0)
Neutro Abs: 5.7 10*3/uL (ref 1.4–7.7)
Neutrophils Relative %: 65.3 % (ref 43.0–77.0)
Platelets: 303 10*3/uL (ref 150.0–400.0)
RBC: 4.43 Mil/uL (ref 3.87–5.11)
RDW: 13.8 % (ref 11.5–15.5)
WBC: 8.7 10*3/uL (ref 4.0–10.5)

## 2013-12-25 LAB — BASIC METABOLIC PANEL
BUN: 21 mg/dL (ref 6–23)
CO2: 26 mEq/L (ref 19–32)
Calcium: 9.3 mg/dL (ref 8.4–10.5)
Chloride: 101 mEq/L (ref 96–112)
Creatinine, Ser: 0.9 mg/dL (ref 0.4–1.2)
GFR: 73.04 mL/min (ref >60.00)
Glucose, Bld: 90 mg/dL (ref 70–99)
Potassium: 3.6 mEq/L (ref 3.5–5.1)
Sodium: 136 mEq/L (ref 135–145)

## 2013-12-25 LAB — HEPATIC FUNCTION PANEL
ALT: 20 U/L (ref 0–35)
AST: 19 U/L (ref 0–37)
Albumin: 4 g/dL (ref 3.5–5.2)
Alkaline Phosphatase: 97 U/L (ref 39–117)
Bilirubin, Direct: 0 mg/dL (ref 0.0–0.3)
Total Bilirubin: 0.3 mg/dL (ref 0.2–1.2)
Total Protein: 7.5 g/dL (ref 6.0–8.3)

## 2013-12-25 LAB — LIPID PANEL
Cholesterol: 196 mg/dL (ref 0–200)
HDL: 36.9 mg/dL — ABNORMAL LOW (ref >39.00)
LDL Cholesterol: 112 mg/dL — ABNORMAL HIGH (ref 0–99)
NonHDL: 159.1
Total CHOL/HDL Ratio: 5
Triglycerides: 236 mg/dL — ABNORMAL HIGH (ref 0.0–149.0)
VLDL: 47.2 mg/dL — ABNORMAL HIGH (ref 0.0–40.0)

## 2013-12-25 LAB — TSH: TSH: 1.12 u[IU]/mL (ref 0.35–4.50)

## 2013-12-25 LAB — SEDIMENTATION RATE: Sed Rate: 47 mm/hr — ABNORMAL HIGH (ref 0–22)
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
## 2013-12-25 NOTE — Progress Notes (Signed)
Pre visit review using our clinic review tool, if applicable. No additional management support is needed unless otherwise documented below in the visit note. 

## 2013-12-25 NOTE — Patient Instructions (Signed)
We will call you with EEG and MRI of brain. Follow up immediately for any recurrent confusion or new symptoms.

## 2013-12-31 ENCOUNTER — Telehealth: Payer: Self-pay | Admitting: Family Medicine

## 2013-12-31 NOTE — Telephone Encounter (Signed)
Pt was seen on Wednesday 12/25/13. Pt states her employer is requesting a note to verify she was seen on that day. Pt needs it faxed over [phone], states please mark confidential, and make to her attention.

## 2013-12-31 NOTE — Telephone Encounter (Signed)
Faxed letter to office

## 2014-01-08 ENCOUNTER — Telehealth: Payer: Self-pay | Admitting: Family Medicine

## 2014-01-08 NOTE — Telephone Encounter (Signed)
Pt was given hydrocodone 5/325 in the past. Not currently on patient med list.

## 2014-01-08 NOTE — Telephone Encounter (Signed)
Pt is requesting pain meds for her headaches, she states dr. Caryl Neverburchette has written her an rx before however she does not remember the name of it. Pt states she thought it was hydrocodone. Pt states her headaches are worse.

## 2014-01-09 ENCOUNTER — Encounter: Payer: Self-pay | Admitting: Family Medicine

## 2014-01-09 NOTE — Telephone Encounter (Signed)
She should not be taking hydrocodone for headaches except very rarely for resistant headache not relieved with anything else.  Would try Naproxen 500 mg one po q 12 prn #30 with no refill.

## 2014-01-10 MED ORDER — HYDROCODONE-ACETAMINOPHEN 5-325 MG PO TABS: 1.0000 | ORAL_TABLET | Freq: Four times a day (QID) | ORAL | Status: DC | PRN

## 2014-01-13 MED ORDER — NAPROXEN 500 MG PO TABS: 500.0000 mg | ORAL_TABLET | Freq: Two times a day (BID) | ORAL | Status: DC | PRN

## 2014-01-13 NOTE — Telephone Encounter (Signed)
Rx sent to pharmacy   

## 2014-01-15 ENCOUNTER — Telehealth: Payer: Self-pay | Admitting: Family Medicine

## 2014-01-15 MED ORDER — FUROSEMIDE 20 MG PO TABS
20.0000 mg | ORAL_TABLET | Freq: Every day | ORAL | Status: DC
Start: 2014-01-15 — End: 2014-03-03

## 2014-01-15 NOTE — Telephone Encounter (Signed)
Rx sent to pharmacy   

## 2014-01-15 NOTE — Telephone Encounter (Signed)
[id number] - [place], [place] - [address] is requesting 90 day re-fill on furosemide (LASIX) 20 MG tablet

## 2014-01-31 ENCOUNTER — Telehealth: Payer: Self-pay | Admitting: Family Medicine

## 2014-01-31 MED ORDER — NAPROXEN 500 MG PO TABS: 500.0000 mg | ORAL_TABLET | Freq: Two times a day (BID) | ORAL | Status: DC | PRN

## 2014-01-31 NOTE — Telephone Encounter (Signed)
Pt states she lost her brother last week and she has had a real bad headache every since, pt would like to know if dr. Caryl Neverburchette will call her in something for the headaches/ [facility] [place].

## 2014-01-31 NOTE — Telephone Encounter (Signed)
Rx sent to pharmacy   

## 2014-02-06 ENCOUNTER — Other Ambulatory Visit: Payer: Self-pay | Admitting: Orthopedic Surgery

## 2014-02-06 DIAGNOSIS — R2231 Localized swelling, mass and lump, right upper limb: Secondary | ICD-10-CM

## 2014-02-07 ENCOUNTER — Telehealth: Payer: Self-pay | Admitting: Family Medicine

## 2014-02-07 MED ORDER — ALBUTEROL SULFATE HFA 108 (90 BASE) MCG/ACT IN AERS: 2.0000 | INHALATION_SPRAY | Freq: Four times a day (QID) | RESPIRATORY_TRACT | Status: DC | PRN

## 2014-02-07 NOTE — Telephone Encounter (Signed)
Rx sent to the pharmacy.

## 2014-02-07 NOTE — Telephone Encounter (Signed)
Pt needs new rx albuterol inhale call into   [facility] [phone]

## 2014-02-14 ENCOUNTER — Other Ambulatory Visit: Payer: BC Managed Care – PPO

## 2014-02-22 ENCOUNTER — Ambulatory Visit
Admission: RE | Admit: 2014-02-22 | Discharge: 2014-02-22 | Disposition: A | Discharge status: Home / Self Care | Payer: BC Managed Care – PPO | Source: Ambulatory Visit | Attending: Orthopedic Surgery | Admitting: Orthopedic Surgery

## 2014-02-22 DIAGNOSIS — R2231 Localized swelling, mass and lump, right upper limb: Secondary | ICD-10-CM

## 2014-02-22 MED ORDER — GADOBENATE DIMEGLUMINE 529 MG/ML IV SOLN
20.0000 mL | Freq: Once | INTRAVENOUS | Status: AC | PRN
  Administered 2014-02-22: 20 mL via INTRAVENOUS

## 2014-02-26 ENCOUNTER — Other Ambulatory Visit (HOSPITAL_COMMUNITY): Payer: Self-pay | Admitting: Orthopedic Surgery

## 2014-02-26 DIAGNOSIS — M79641 Pain in right hand: Secondary | ICD-10-CM

## 2014-03-01 ENCOUNTER — Other Ambulatory Visit: Payer: Self-pay | Admitting: Family Medicine

## 2014-03-03 ENCOUNTER — Encounter (HOSPITAL_COMMUNITY): Payer: Self-pay | Admitting: Pharmacy Technician

## 2014-03-03 ENCOUNTER — Other Ambulatory Visit: Payer: Self-pay | Admitting: Radiology

## 2014-03-04 NOTE — Patient Instructions (Signed)
Reminded patient to arrive at 0730 on 03-05-14, Npo after midnight, and have a driver.

## 2014-03-05 ENCOUNTER — Other Ambulatory Visit (HOSPITAL_COMMUNITY): Payer: Self-pay | Admitting: Orthopedic Surgery

## 2014-03-05 ENCOUNTER — Encounter (HOSPITAL_COMMUNITY): Payer: Self-pay

## 2014-03-05 ENCOUNTER — Ambulatory Visit (HOSPITAL_COMMUNITY)
Admission: RE | Admit: 2014-03-05 | Discharge: 2014-03-05 | Disposition: A | Discharge status: Home / Self Care | Payer: BC Managed Care – PPO | Source: Ambulatory Visit | Attending: Orthopedic Surgery | Admitting: Orthopedic Surgery

## 2014-03-05 VITALS — BP 123/86 | HR 90 | Temp 98.0°F | Resp 16 | Ht 64.0 in | Wt 294.0 lb

## 2014-03-05 DIAGNOSIS — J45909 Unspecified asthma, uncomplicated: Secondary | ICD-10-CM | POA: Diagnosis not present

## 2014-03-05 DIAGNOSIS — G4730 Sleep apnea, unspecified: Secondary | ICD-10-CM | POA: Insufficient documentation

## 2014-03-05 DIAGNOSIS — K219 Gastro-esophageal reflux disease without esophagitis: Secondary | ICD-10-CM | POA: Insufficient documentation

## 2014-03-05 DIAGNOSIS — Z791 Long term (current) use of non-steroidal anti-inflammatories (NSAID): Secondary | ICD-10-CM | POA: Insufficient documentation

## 2014-03-05 DIAGNOSIS — E7800 Pure hypercholesterolemia, unspecified: Secondary | ICD-10-CM | POA: Diagnosis not present

## 2014-03-05 DIAGNOSIS — IMO0002 Reserved for concepts with insufficient information to code with codable children: Secondary | ICD-10-CM | POA: Insufficient documentation

## 2014-03-05 DIAGNOSIS — M79641 Pain in right hand: Secondary | ICD-10-CM

## 2014-03-05 DIAGNOSIS — M79609 Pain in unspecified limb: Secondary | ICD-10-CM | POA: Insufficient documentation

## 2014-03-05 DIAGNOSIS — I10 Essential (primary) hypertension: Secondary | ICD-10-CM | POA: Diagnosis not present

## 2014-03-05 LAB — CBC WITH DIFFERENTIAL/PLATELET
Basophils Absolute: 0 10*3/uL (ref 0.0–0.1)
Basophils Relative: 0 % (ref 0–1)
Eosinophils Absolute: 0.3 10*3/uL (ref 0.0–0.7)
Eosinophils Relative: 5 % (ref 0–5)
HCT: 37.6 % (ref 36.0–46.0)
Hemoglobin: 12.7 g/dL (ref 12.0–15.0)
Lymphocytes Relative: 27 % (ref 12–46)
Lymphs Abs: 1.5 10*3/uL (ref 0.7–4.0)
MCH: 29.3 pg (ref 26.0–34.0)
MCHC: 33.8 g/dL (ref 30.0–36.0)
MCV: 86.6 fL (ref 78.0–100.0)
Monocytes Absolute: 0.3 10*3/uL (ref 0.1–1.0)
Monocytes Relative: 6 % (ref 3–12)
Neutro Abs: 3.5 10*3/uL (ref 1.7–7.7)
Neutrophils Relative %: 62 % (ref 43–77)
Platelets: 226 10*3/uL (ref 150–400)
RBC: 4.34 MIL/uL (ref 3.87–5.11)
RDW: 13.7 % (ref 11.5–15.5)
WBC: 5.6 10*3/uL (ref 4.0–10.5)

## 2014-03-05 LAB — BASIC METABOLIC PANEL
Anion gap: 11 (ref 5–15)
BUN: 10 mg/dL (ref 6–23)
CO2: 29 mEq/L (ref 19–32)
Calcium: 9.8 mg/dL (ref 8.4–10.5)
Chloride: 96 mEq/L (ref 96–112)
Creatinine, Ser: 0.81 mg/dL (ref 0.50–1.10)
GFR calc Af Amer: >90 mL/min (ref >90)
GFR calc non Af Amer: 83 mL/min — ABNORMAL LOW (ref >90)
Glucose, Bld: 103 mg/dL — ABNORMAL HIGH (ref 70–99)
Potassium: 4.3 mEq/L (ref 3.7–5.3)
Sodium: 136 mEq/L — ABNORMAL LOW (ref 137–147)

## 2014-03-05 LAB — PROTIME-INR
INR: 1.01 (ref 0.00–1.49)
Prothrombin Time: 13.3 seconds (ref 11.6–15.2)

## 2014-03-05 LAB — APTT: aPTT: 31 seconds (ref 24–37)

## 2014-03-05 MED ORDER — MIDAZOLAM HCL 2 MG/2ML IJ SOLN
INTRAMUSCULAR | Status: AC
  Filled 2014-03-05: qty 6

## 2014-03-05 MED ORDER — SODIUM CHLORIDE 0.9 % IV SOLN
Freq: Once | INTRAVENOUS | Status: AC
  Administered 2014-03-05: 250 mL via INTRAVENOUS

## 2014-03-05 MED ORDER — LIDOCAINE HCL 1 % IJ SOLN
INTRAMUSCULAR | Status: AC
  Filled 2014-03-05: qty 20

## 2014-03-05 MED ORDER — IODIXANOL 320 MG/ML IV SOLN
100.0000 mL | Freq: Once | INTRAVENOUS | Status: AC | PRN
  Administered 2014-03-05: 1 mL via INTRA_ARTERIAL

## 2014-03-05 MED ORDER — OXYCODONE-ACETAMINOPHEN 5-325 MG PO TABS
1.0000 | ORAL_TABLET | Freq: Four times a day (QID) | ORAL | Status: DC | PRN
  Administered 2014-03-05: 1 via ORAL
  Filled 2014-03-05: qty 1

## 2014-03-05 MED ORDER — ONDANSETRON HCL 4 MG/2ML IJ SOLN
INTRAMUSCULAR | Status: AC
  Filled 2014-03-05: qty 2

## 2014-03-05 MED ORDER — FENTANYL CITRATE 0.05 MG/ML IJ SOLN
INTRAMUSCULAR | Status: AC
  Filled 2014-03-05: qty 6

## 2014-03-05 MED ORDER — MIDAZOLAM HCL 2 MG/2ML IJ SOLN
INTRAMUSCULAR | Status: AC | PRN
Start: 2014-03-05 — End: 2014-03-05
  Administered 2014-03-05 (×6): 0.5 mg via INTRAVENOUS
  Administered 2014-03-05: 1 mg via INTRAVENOUS

## 2014-03-05 MED ORDER — FENTANYL CITRATE 0.05 MG/ML IJ SOLN
INTRAMUSCULAR | Status: AC | PRN
  Administered 2014-03-05: 50 ug via INTRAVENOUS
  Administered 2014-03-05 (×2): 25 ug via INTRAVENOUS
  Administered 2014-03-05: 50 ug via INTRAVENOUS
  Administered 2014-03-05: 25 ug via INTRAVENOUS

## 2014-03-05 MED ORDER — ONDANSETRON HCL 4 MG/2ML IJ SOLN
INTRAMUSCULAR | Status: AC | PRN
  Administered 2014-03-05: 4 mg via INTRAVENOUS

## 2014-03-05 NOTE — Procedures (Signed)
Post-Procedure Note  Pre-operative Diagnosis: Right hand lesion.  Rule out aneurysm.       Post-operative Diagnosis: Normal right upper extremity angiogram, no aneurysm.   Indications: Right hand lesion   Procedure Details:   Right upper extremity angiogram performed from right groin access.    Findings: Normal right upper extremity angiogram, no aneurysm or vascular malformation.  Complications: None     Condition: good  Plan: Post angio recovery in Short Stay.  Plan to discharge home today.

## 2014-03-05 NOTE — Sedation Documentation (Signed)
5Fr sheath removed from R femoral artery by Dr. Lowella DandyHenn.  Hemostasis achieved using manual pressure. R groin level 0.

## 2014-03-05 NOTE — Discharge Instructions (Signed)
Arteriogram °Care After °These instructions give you information on caring for yourself after your procedure. Your doctor may also give you more specific instructions. Call your doctor if you have any problems or questions after your procedure. °HOME CARE °· Keep your leg straight for at least 6 hours. °· Do not bathe, swim, or use a hot tub until directed by your doctor. You can shower. °· Do not lift anything heavier than 10 pounds (about a gallon of milk) for 2 days. °· Do not walk a lot, run, or drive for 2 days. °· Return to normal activities in 2 days or as told by your doctor. °Finding out the results of your test °Ask when your test results will be ready. Make sure you get your test results. °GET HELP RIGHT AWAY IF:  °· You have fever. °· You have more pain in your leg. °· The leg that was cut is: °¨ Bleeding. °¨ Puffy (swollen) or red. °¨ Cold. °¨ Pale or changes color. °¨ Weak. °¨ Tingly or numb. °If you go to the Emergency Room, tell your nurse that you have had an arteriogram. Take this paper with you to show the nurse. °MAKE SURE YOU: °· Understand these instructions. °· Will watch your condition. °· Will get help right away if you are not doing well or get worse. °Document Released: 10/07/2008 Document Revised: 07/16/2013 Document Reviewed: 10/07/2008 °ExitCare® Patient Information ©2015 [employer]. This information is not intended to replace advice given to you by your health care provider. Make sure you discuss any questions you have with your health care provider. °Conscious Sedation °Sedation is the use of medicines to promote relaxation and relieve discomfort and anxiety. Conscious sedation is a type of sedation. Under conscious sedation you are less alert than normal but are still able to respond to instructions or stimulation. Conscious sedation is used during short medical and dental procedures. It is milder than deep sedation or general anesthesia and allows you to return to your regular  activities sooner.  °LET YOUR HEALTH CARE PROVIDER KNOW ABOUT:  °· Any allergies you have. °· All medicines you are taking, including vitamins, herbs, eye drops, creams, and over-the-counter medicines. °· Use of steroids (by mouth or creams). °· Previous problems you or members of your family have had with the use of anesthetics. °· Any blood disorders you have. °· Previous surgeries you have had. °· Medical conditions you have. °· Possibility of pregnancy, if this applies. °· Use of cigarettes, alcohol, or illegal drugs. °RISKS AND COMPLICATIONS °Generally, this is a safe procedure. However, as with any procedure, problems can occur. Possible problems include: °· Oversedation. °· Trouble breathing on your own. You may need to have a breathing tube until you are awake and breathing on your own. °· Allergic reaction to any of the medicines used for the procedure. °BEFORE THE PROCEDURE °· You may have blood tests done. These tests can help show how well your kidneys and liver are working. They can also show how well your blood clots. °· A physical exam will be done.   °· Only take medicines as directed by your health care provider. You may need to stop taking medicines (such as blood thinners, aspirin, or nonsteroidal anti-inflammatory drugs) before the procedure.   °· Do not eat or drink at least 6 hours before the procedure or as directed by your health care provider. °· Arrange for a responsible adult, family member, or friend to take you home after the procedure. He or she should stay   with you for at least 24 hours after the procedure, until the medicine has worn off. °PROCEDURE  °· An intravenous (IV) catheter will be inserted into one of your veins. Medicine will be able to flow directly into your body through this catheter. You may be given medicine through this tube to help prevent pain and help you relax. °· The medical or dental procedure will be done. °AFTER THE PROCEDURE °· You will stay in a recovery area  until the medicine has worn off. Your blood pressure and pulse will be checked.   °·  Depending on the procedure you had, you may be allowed to go home when you can tolerate liquids and your pain is under control. °Document Released: 04/05/2001 Document Revised: 07/16/2013 Document Reviewed: 03/18/2013 °ExitCare® Patient Information ©2015 [employer]. This information is not intended to replace advice given to you by your health care provider. Make sure you discuss any questions you have with your health care provider. ° °

## 2014-03-05 NOTE — H&P (Signed)
[name] is an 53 y.o. female.   Chief Complaint:right hand pain HPI: Patient with 4-5 month history of right hand pain/decreased strength and MRI revealing septated ganglion cyst extending from volar aspect of second carpometacarpal joint radially and distally through the first webspace and terminating in the superficial SQ fat of the second web space. Per referring MD office note area shows flow on doppler. She presents today for right upper extremity arteriogram to rule out aneurysm in region.  Past Medical History  Diagnosis Date  . Chronic back pain   . Anxiety   . Depression   . Hypertension   . High cholesterol   . GERD (gastroesophageal reflux disease)   . Colon polyps   . Asthma   . Trigger finger of both hands 2007    thumb  . PONV (postoperative nausea and vomiting)   . Migraines     hx of   . Arthritis   . Anemia     hx of with pregnancy   . Sleep apnea     does not use CPAP machine     Past Surgical History  Procedure Laterality Date  . Back surgery    . Endometrial ablation w/ novasure    . Carpal tunnel release  2007    both hands  . Trigger finger release surgeyr       bilateral   . Tubal ligation    . Knee arthroscopy      let knee 03/2013   . Knee arthroscopy with medial menisectomy Right 10/22/2013    Procedure: RIGHT KNEE ARTHROSCOPY WITH MEDIAL MENISECTOMY, abrasion chrondrplasty Rickard Patience OUT/CORRECT CURETTEMENT/BONE GRAFT/PROXIMAL TIBIA ;  Surgeon: Tobi Bastos, MD;  Location: [facility];  Service: Orthopedics;  Laterality: Right;    Family History  Problem Relation Age of Onset  . Alcohol abuse Mother   . Arthritis Mother   . Hyperlipidemia Father   . Hypertension Father   . Heart disease Father 64    CABG  . Hypertension Sister   . Cancer Brother     colon, lung, liver  . Cancer Maternal Grandmother     breast  . Cancer Daughter     NEUROBLASTOMA   Social History:  reports that she has never smoked. She has never used smokeless  tobacco. She reports that she does not drink alcohol or use illicit drugs.  Allergies:  Allergies  Allergen Reactions  . Adhesive [Tape] Other (See Comments)    Peeled off skin  . Ceclor [Cefaclor] Other (See Comments)    Blisters on hands  . Chocolate Other (See Comments)    migraines  . Venlafaxine Other (See Comments)    "refuses to take - makes me feel like I am dying"    Current outpatient prescriptions:albuterol (PROVENTIL HFA;VENTOLIN HFA) 108 (90 BASE) MCG/ACT inhaler, Inhale 2 puffs into the lungs every 6 (six) hours as needed for wheezing., Disp: 8.5 g, Rfl: 5;  ALPRAZolam (XANAX) 0.5 MG tablet, Take 0.5 mg by mouth 2 (two) times daily., Disp: , Rfl: ;  amLODipine (NORVASC) 10 MG tablet, Take 10 mg by mouth every morning., Disp: , Rfl:  budesonide-formoterol (SYMBICORT) 160-4.5 MCG/ACT inhaler, Inhale 1 puff into the lungs 2 (two) times daily. , Disp: , Rfl: ;  Cholecalciferol 1000 UNITS TBDP, Take 2,000 Units by mouth at bedtime., Disp: , Rfl: ;  furosemide (LASIX) 20 MG tablet, Take 20 mg by mouth every morning., Disp: , Rfl: ;  ibuprofen (ADVIL,MOTRIN) 200 MG tablet, Take  800 mg by mouth every 6 (six) hours as needed (For pain.). , Disp: , Rfl:  lisinopril (PRINIVIL,ZESTRIL) 20 MG tablet, Take 20 mg by mouth every morning., Disp: , Rfl: ;  lithium carbonate 300 MG capsule, Take 300 mg by mouth at bedtime., Disp: , Rfl: ;  Melatonin 10 MG CAPS, Take 20 mg by mouth at bedtime., Disp: , Rfl: ;  montelukast (SINGULAIR) 10 MG tablet, Take 10 mg by mouth at bedtime., Disp: , Rfl:  naproxen (NAPROSYN) 500 MG tablet, Take 500 mg by mouth every 12 (twelve) hours as needed (For headaches.)., Disp: , Rfl: ;  Polyvinyl Alcohol-Povidone (REFRESH OP), Place 2 drops into both eyes daily as needed (For dry eyes.). , Disp: , Rfl: ;  pravastatin (PRAVACHOL) 40 MG tablet, Take 40 mg by mouth at bedtime., Disp: , Rfl:  promethazine (PHENERGAN) 25 MG tablet, Take 25 mg by mouth every 6 (six) hours as  needed for nausea or vomiting. , Disp: , Rfl: ;  sertraline (ZOLOFT) 100 MG tablet, Take 100 mg by mouth 2 (two) times daily. , Disp: , Rfl: ;  zolpidem (AMBIEN CR) 12.5 MG CR tablet, Take 12.5 mg by mouth at bedtime., Disp: , Rfl:    Results for orders placed during the hospital encounter of 03/05/14 (from the past 48 hour(s))  APTT     Status: None   Collection Time    03/05/14  7:29 AM      Result Value Ref Range   aPTT 31  24 - 37 seconds  BASIC METABOLIC PANEL     Status: Abnormal   Collection Time    03/05/14  7:29 AM      Result Value Ref Range   Sodium 136 (*) 137 - 147 mEq/L   Potassium 4.3  3.7 - 5.3 mEq/L   Chloride 96  96 - 112 mEq/L   CO2 29  19 - 32 mEq/L   Glucose, Bld 103 (*) 70 - 99 mg/dL   BUN 10  6 - 23 mg/dL   Creatinine, Ser 0.81  0.50 - 1.10 mg/dL   Calcium 9.8  8.4 - 10.5 mg/dL   GFR calc non Af Amer 83 (*) >90 mL/min   GFR calc Af Amer >90  >90 mL/min   Comment: (NOTE)     The eGFR has been calculated using the CKD EPI equation.     This calculation has not been validated in all clinical situations.     eGFR's persistently <90 mL/min signify possible Chronic Kidney     Disease.   Anion gap 11  5 - 15  CBC WITH DIFFERENTIAL     Status: None   Collection Time    03/05/14  7:29 AM      Result Value Ref Range   WBC 5.6  4.0 - 10.5 K/uL   RBC 4.34  3.87 - 5.11 MIL/uL   Hemoglobin 12.7  12.0 - 15.0 g/dL   HCT 37.6  36.0 - 46.0 %   MCV 86.6  78.0 - 100.0 fL   MCH 29.3  26.0 - 34.0 pg   MCHC 33.8  30.0 - 36.0 g/dL   RDW 13.7  11.5 - 15.5 %   Platelets 226  150 - 400 K/uL   Neutrophils Relative % 62  43 - 77 %   Neutro Abs 3.5  1.7 - 7.7 K/uL   Lymphocytes Relative 27  12 - 46 %   Lymphs Abs 1.5  0.7 - 4.0 K/uL  Monocytes Relative 6  3 - 12 %   Monocytes Absolute 0.3  0.1 - 1.0 K/uL   Eosinophils Relative 5  0 - 5 %   Eosinophils Absolute 0.3  0.0 - 0.7 K/uL   Basophils Relative 0  0 - 1 %   Basophils Absolute 0.0  0.0 - 0.1 K/uL  PROTIME-INR      Status: None   Collection Time    03/05/14  7:29 AM      Result Value Ref Range   Prothrombin Time 13.3  11.6 - 15.2 seconds   INR 1.01  0.00 - 1.49   No results found.  Review of Systems  Constitutional: Negative for fever and chills.  Respiratory: Negative for hemoptysis and shortness of breath.        Occ cough; orthopnea  Cardiovascular:       Occ chest discomfort which pt attributes to stress over recent family members' death  Gastrointestinal: Negative for nausea, vomiting and abdominal pain.  Genitourinary: Negative for dysuria and hematuria.  Musculoskeletal: Negative for back pain.       Rt hand pain/mild decrease in grip strength  Neurological: Negative for dizziness, sensory change, speech change and headaches.  Endo/Heme/Allergies: Does not bruise/bleed easily.    Blood pressure 141/86, pulse 96, temperature 98.8 F (37.1 C), temperature source Oral, resp. rate 16, height $RemoveBe'5\' 4"'Fojwonpay$  (1.626 m), weight 294 lb (133.358 kg), SpO2 96.00%. Physical Exam  Constitutional: She is oriented to person, place, and time. She appears well-developed and well-nourished.  Cardiovascular: Normal rate and regular rhythm.   Respiratory: Effort normal and breath sounds normal.  GI: Soft. Bowel sounds are normal. There is no tenderness.  obese  Musculoskeletal: Normal range of motion.  Mass/ganglion in first web space rt hand, mildly tender, non pulsatile; intact radial/ulnar pulses; no paresthesias  Neurological: She is alert and oriented to person, place, and time.     Assessment/Plan Patient with 4-5 month history of right hand pain/decreased strength and MRI revealing septated ganglion cyst extending from volar aspect of second carpometacarpal joint radially and distally through the first webspace and terminating in the superficial SQ fat of the second web space. Per referring MD office note area shows flow on doppler. She presents today for right upper extremity arteriogram to rule out  aneurysm in region. Details/risks of procedure d/w pt/husband with their understanding and consent.  Heaven Wandell,D KEVIN 03/05/2014, 8:29 AM

## 2014-03-05 NOTE — Sedation Documentation (Signed)
Gauze tegaderm bandage applied to RFA puncture site by Merlene MorseKathy Mazepa, RN.  Groin level 0, 3-4+ RDP.

## 2014-03-07 ENCOUNTER — Encounter (HOSPITAL_BASED_OUTPATIENT_CLINIC_OR_DEPARTMENT_OTHER): Payer: Self-pay | Admitting: *Deleted

## 2014-03-07 NOTE — Progress Notes (Signed)
Pt had a sleep study and has a cpap-then had another 2013-did not need a cpap-she does not use it now-does snore-takes a lot of meds at night-she will bring all meds and overnight bag just in case she needs to stay- She did have a stress test 2 yr ago-ef 72% Had ekg-3/15-bmet-03/05/14

## 2014-03-10 ENCOUNTER — Encounter (HOSPITAL_BASED_OUTPATIENT_CLINIC_OR_DEPARTMENT_OTHER): Payer: BC Managed Care – PPO | Admitting: Anesthesiology

## 2014-03-10 ENCOUNTER — Other Ambulatory Visit: Payer: Self-pay | Admitting: Orthopedic Surgery

## 2014-03-10 ENCOUNTER — Encounter (HOSPITAL_BASED_OUTPATIENT_CLINIC_OR_DEPARTMENT_OTHER)
Admission: RE | Disposition: A | Discharge status: Home / Self Care | Payer: Self-pay | Source: Ambulatory Visit | Attending: Orthopedic Surgery

## 2014-03-10 ENCOUNTER — Encounter (HOSPITAL_BASED_OUTPATIENT_CLINIC_OR_DEPARTMENT_OTHER): Payer: Self-pay | Admitting: Anesthesiology

## 2014-03-10 ENCOUNTER — Ambulatory Visit (HOSPITAL_BASED_OUTPATIENT_CLINIC_OR_DEPARTMENT_OTHER)
Admission: RE | Admit: 2014-03-10 | Discharge: 2014-03-10 | Disposition: A | Discharge status: Home / Self Care | Payer: BC Managed Care – PPO | Source: Ambulatory Visit | Attending: Orthopedic Surgery | Admitting: Orthopedic Surgery

## 2014-03-10 ENCOUNTER — Ambulatory Visit (HOSPITAL_BASED_OUTPATIENT_CLINIC_OR_DEPARTMENT_OTHER): Payer: BC Managed Care – PPO | Admitting: Anesthesiology

## 2014-03-10 ENCOUNTER — Telehealth: Payer: Self-pay

## 2014-03-10 DIAGNOSIS — G4730 Sleep apnea, unspecified: Secondary | ICD-10-CM | POA: Insufficient documentation

## 2014-03-10 DIAGNOSIS — Z888 Allergy status to other drugs, medicaments and biological substances status: Secondary | ICD-10-CM | POA: Diagnosis not present

## 2014-03-10 DIAGNOSIS — G8929 Other chronic pain: Secondary | ICD-10-CM | POA: Diagnosis not present

## 2014-03-10 DIAGNOSIS — M7130 Other bursal cyst, unspecified site: Secondary | ICD-10-CM | POA: Insufficient documentation

## 2014-03-10 DIAGNOSIS — F411 Generalized anxiety disorder: Secondary | ICD-10-CM | POA: Insufficient documentation

## 2014-03-10 DIAGNOSIS — R229 Localized swelling, mass and lump, unspecified: Secondary | ICD-10-CM | POA: Diagnosis present

## 2014-03-10 DIAGNOSIS — Z860100 Personal history of colon polyps, unspecified: Secondary | ICD-10-CM | POA: Insufficient documentation

## 2014-03-10 DIAGNOSIS — I10 Essential (primary) hypertension: Secondary | ICD-10-CM | POA: Insufficient documentation

## 2014-03-10 DIAGNOSIS — E7800 Pure hypercholesterolemia, unspecified: Secondary | ICD-10-CM | POA: Insufficient documentation

## 2014-03-10 DIAGNOSIS — J45909 Unspecified asthma, uncomplicated: Secondary | ICD-10-CM | POA: Diagnosis not present

## 2014-03-10 DIAGNOSIS — G43909 Migraine, unspecified, not intractable, without status migrainosus: Secondary | ICD-10-CM | POA: Insufficient documentation

## 2014-03-10 DIAGNOSIS — F329 Major depressive disorder, single episode, unspecified: Secondary | ICD-10-CM | POA: Insufficient documentation

## 2014-03-10 DIAGNOSIS — Z91018 Allergy to other foods: Secondary | ICD-10-CM | POA: Diagnosis not present

## 2014-03-10 DIAGNOSIS — M549 Dorsalgia, unspecified: Secondary | ICD-10-CM | POA: Diagnosis not present

## 2014-03-10 DIAGNOSIS — F3289 Other specified depressive episodes: Secondary | ICD-10-CM | POA: Diagnosis not present

## 2014-03-10 DIAGNOSIS — K219 Gastro-esophageal reflux disease without esophagitis: Secondary | ICD-10-CM | POA: Insufficient documentation

## 2014-03-10 HISTORY — PX: EXCISION METACARPAL MASS: SHX6372

## 2014-03-10 LAB — POCT HEMOGLOBIN-HEMACUE: Hemoglobin: 12.7 g/dL (ref 12.0–15.0)

## 2014-03-10 SURGERY — EXCISION METACARPAL MASS
Anesthesia: General | Site: Hand | Laterality: Right

## 2014-03-10 MED ORDER — OXYCODONE HCL 5 MG PO TABS: 5.0000 mg | ORAL_TABLET | Freq: Once | ORAL | Status: DC | PRN

## 2014-03-10 MED ORDER — OXYCODONE HCL 5 MG/5ML PO SOLN: 5.0000 mg | Freq: Once | ORAL | Status: DC | PRN

## 2014-03-10 MED ORDER — LACTATED RINGERS IV SOLN
INTRAVENOUS | Status: DC
  Administered 2014-03-10: 13:00:00 via INTRAVENOUS

## 2014-03-10 MED ORDER — MIDAZOLAM HCL 2 MG/2ML IJ SOLN
INTRAMUSCULAR | Status: AC
  Filled 2014-03-10: qty 2

## 2014-03-10 MED ORDER — ONDANSETRON HCL 4 MG/2ML IJ SOLN: 4.0000 mg | Freq: Once | INTRAMUSCULAR | Status: DC | PRN

## 2014-03-10 MED ORDER — 0.9 % SODIUM CHLORIDE (POUR BTL) OPTIME
TOPICAL | Status: DC | PRN
  Administered 2014-03-10: 200 mL

## 2014-03-10 MED ORDER — CHLORHEXIDINE GLUCONATE 4 % EX LIQD: 60.0000 mL | Freq: Once | CUTANEOUS | Status: DC

## 2014-03-10 MED ORDER — FENTANYL CITRATE 0.05 MG/ML IJ SOLN
INTRAMUSCULAR | Status: AC
  Filled 2014-03-10: qty 2

## 2014-03-10 MED ORDER — ONDANSETRON HCL 4 MG/2ML IJ SOLN
INTRAMUSCULAR | Status: DC | PRN
  Administered 2014-03-10: 4 mg via INTRAVENOUS

## 2014-03-10 MED ORDER — VANCOMYCIN HCL IN DEXTROSE 500-5 MG/100ML-% IV SOLN
INTRAVENOUS | Status: AC
  Filled 2014-03-10: qty 100

## 2014-03-10 MED ORDER — VANCOMYCIN HCL IN DEXTROSE 1-5 GM/200ML-% IV SOLN
INTRAVENOUS | Status: AC
  Filled 2014-03-10: qty 200

## 2014-03-10 MED ORDER — DEXAMETHASONE SODIUM PHOSPHATE 10 MG/ML IJ SOLN
INTRAMUSCULAR | Status: DC | PRN
Start: 2014-03-10 — End: 2014-03-10
  Administered 2014-03-10: 10 mg via INTRAVENOUS

## 2014-03-10 MED ORDER — VANCOMYCIN HCL 10 G IV SOLR: 1500.0000 mg | INTRAVENOUS | Status: DC

## 2014-03-10 MED ORDER — BUPIVACAINE HCL (PF) 0.25 % IJ SOLN
INTRAMUSCULAR | Status: AC
Start: 2014-03-10 — End: 2014-03-10
  Filled 2014-03-10: qty 30

## 2014-03-10 MED ORDER — BUPIVACAINE-EPINEPHRINE (PF) 0.5% -1:200000 IJ SOLN
INTRAMUSCULAR | Status: DC | PRN
  Administered 2014-03-10: 30 mL via PERINEURAL

## 2014-03-10 MED ORDER — HYDROCODONE-ACETAMINOPHEN 10-325 MG PO TABS: 1.0000 | ORAL_TABLET | Freq: Four times a day (QID) | ORAL | Status: DC | PRN

## 2014-03-10 MED ORDER — EPHEDRINE SULFATE 50 MG/ML IJ SOLN
INTRAMUSCULAR | Status: DC | PRN
  Administered 2014-03-10: 15 mg via INTRAVENOUS
  Administered 2014-03-10: 10 mg via INTRAVENOUS

## 2014-03-10 MED ORDER — FENTANYL CITRATE 0.05 MG/ML IJ SOLN
50.0000 ug | INTRAMUSCULAR | Status: DC | PRN
  Administered 2014-03-10 (×2): 100 ug via INTRAVENOUS

## 2014-03-10 MED ORDER — HYDROMORPHONE HCL PF 1 MG/ML IJ SOLN: 0.2500 mg | INTRAMUSCULAR | Status: DC | PRN

## 2014-03-10 MED ORDER — PROPOFOL 10 MG/ML IV BOLUS
INTRAVENOUS | Status: AC
  Filled 2014-03-10: qty 20

## 2014-03-10 MED ORDER — VANCOMYCIN HCL 10 G IV SOLR
1500.0000 mg | INTRAVENOUS | Status: AC
  Administered 2014-03-10: 1500 mg via INTRAVENOUS

## 2014-03-10 MED ORDER — PROPOFOL 10 MG/ML IV BOLUS
INTRAVENOUS | Status: DC | PRN
Start: 2014-03-10 — End: 2014-03-10
  Administered 2014-03-10: 200 mg via INTRAVENOUS

## 2014-03-10 MED ORDER — MEPERIDINE HCL 25 MG/ML IJ SOLN: 6.2500 mg | INTRAMUSCULAR | Status: DC | PRN

## 2014-03-10 MED ORDER — PROPOFOL 10 MG/ML IV BOLUS
INTRAVENOUS | Status: AC
  Filled 2014-03-10: qty 60

## 2014-03-10 MED ORDER — MIDAZOLAM HCL 2 MG/2ML IJ SOLN
1.0000 mg | INTRAMUSCULAR | Status: DC | PRN
  Administered 2014-03-10: 2 mg via INTRAVENOUS

## 2014-03-10 MED ORDER — LIDOCAINE HCL (CARDIAC) 20 MG/ML IV SOLN
INTRAVENOUS | Status: DC | PRN
  Administered 2014-03-10: 100 mg via INTRAVENOUS

## 2014-03-10 MED ORDER — FENTANYL CITRATE 0.05 MG/ML IJ SOLN
INTRAMUSCULAR | Status: AC
  Filled 2014-03-10: qty 4

## 2014-03-10 SURGICAL SUPPLY — 55 items
BANDAGE COBAN STERILE 2 (GAUZE/BANDAGES/DRESSINGS) IMPLANT
BLADE MINI RND TIP GREEN BEAV (BLADE) IMPLANT
BLADE SURG 15 STRL LF DISP TIS (BLADE) ×1 IMPLANT
BLADE SURG 15 STRL SS (BLADE) ×2
BNDG CMPR 9X4 STRL LF SNTH (GAUZE/BANDAGES/DRESSINGS) ×1
BNDG COHESIVE 1X5 TAN STRL LF (GAUZE/BANDAGES/DRESSINGS) IMPLANT
BNDG COHESIVE 3X5 TAN STRL LF (GAUZE/BANDAGES/DRESSINGS) ×1 IMPLANT
BNDG ESMARK 4X9 LF (GAUZE/BANDAGES/DRESSINGS) ×1 IMPLANT
BNDG GAUZE ELAST 4 BULKY (GAUZE/BANDAGES/DRESSINGS) ×1 IMPLANT
CHLORAPREP W/TINT 26ML (MISCELLANEOUS) ×2 IMPLANT
CORDS BIPOLAR (ELECTRODE) ×2 IMPLANT
COVER MAYO STAND STRL (DRAPES) ×2 IMPLANT
COVER TABLE BACK 60X90 (DRAPES) ×2 IMPLANT
CUFF TOURNIQUET SINGLE 18IN (TOURNIQUET CUFF) IMPLANT
CUFF TOURNIQUET SINGLE 24IN (TOURNIQUET CUFF) ×1 IMPLANT
DECANTER SPIKE VIAL GLASS SM (MISCELLANEOUS) IMPLANT
DRAIN PENROSE 1/2X12 LTX STRL (WOUND CARE) IMPLANT
DRAPE EXTREMITY T 121X128X90 (DRAPE) ×2 IMPLANT
DRAPE SURG 17X23 STRL (DRAPES) ×2 IMPLANT
GAUZE SPONGE 4X4 12PLY STRL (GAUZE/BANDAGES/DRESSINGS) ×2 IMPLANT
GAUZE XEROFORM 1X8 LF (GAUZE/BANDAGES/DRESSINGS) ×2 IMPLANT
GLOVE BIO SURGEON STRL SZ 6.5 (GLOVE) ×1 IMPLANT
GLOVE BIO SURGEON STRL SZ7.5 (GLOVE) ×1 IMPLANT
GLOVE BIOGEL PI IND STRL 8 (GLOVE) IMPLANT
GLOVE BIOGEL PI IND STRL 8.5 (GLOVE) ×1 IMPLANT
GLOVE BIOGEL PI INDICATOR 8 (GLOVE) ×1
GLOVE BIOGEL PI INDICATOR 8.5 (GLOVE) ×1
GLOVE EXAM NITRILE MD LF STRL (GLOVE) ×1 IMPLANT
GLOVE SURG ORTHO 8.0 STRL STRW (GLOVE) ×2 IMPLANT
GOWN STRL REUS W/ TWL LRG LVL3 (GOWN DISPOSABLE) ×1 IMPLANT
GOWN STRL REUS W/TWL LRG LVL3 (GOWN DISPOSABLE) ×2
GOWN STRL REUS W/TWL XL LVL3 (GOWN DISPOSABLE) ×3 IMPLANT
NDL SAFETY ECLIPSE 18X1.5 (NEEDLE) ×1 IMPLANT
NEEDLE 27GAX1X1/2 (NEEDLE) IMPLANT
NEEDLE HYPO 18GX1.5 SHARP (NEEDLE)
NS IRRIG 1000ML POUR BTL (IV SOLUTION) ×2 IMPLANT
PACK BASIN DAY SURGERY FS (CUSTOM PROCEDURE TRAY) ×2 IMPLANT
PAD CAST 3X4 CTTN HI CHSV (CAST SUPPLIES) IMPLANT
PADDING CAST ABS 3INX4YD NS (CAST SUPPLIES) ×1
PADDING CAST ABS 4INX4YD NS (CAST SUPPLIES)
PADDING CAST ABS COTTON 3X4 (CAST SUPPLIES) IMPLANT
PADDING CAST ABS COTTON 4X4 ST (CAST SUPPLIES) ×1 IMPLANT
PADDING CAST COTTON 3X4 STRL (CAST SUPPLIES)
SPLINT PLASTER CAST XFAST 3X15 (CAST SUPPLIES) IMPLANT
SPLINT PLASTER XTRA FASTSET 3X (CAST SUPPLIES) ×5
STOCKINETTE 4X48 STRL (DRAPES) ×2 IMPLANT
SUT VIC AB 4-0 P2 18 (SUTURE) IMPLANT
SUT VIC AB 4-0 RB1 27 (SUTURE) ×2
SUT VIC AB 4-0 RB1 27X BRD (SUTURE) IMPLANT
SUT VICRYL RAPID 5 0 P 3 (SUTURE) IMPLANT
SUT VICRYL RAPIDE 4/0 PS 2 (SUTURE) ×2 IMPLANT
SYR BULB 3OZ (MISCELLANEOUS) ×2 IMPLANT
SYR CONTROL 10ML LL (SYRINGE) IMPLANT
TOWEL OR 17X24 6PK STRL BLUE (TOWEL DISPOSABLE) ×4 IMPLANT
UNDERPAD 30X30 INCONTINENT (UNDERPADS AND DIAPERS) ×2 IMPLANT

## 2014-03-10 NOTE — Telephone Encounter (Signed)
Left Knee

## 2014-03-10 NOTE — Telephone Encounter (Signed)
Pt is having surgery this afternoon on her hand.  Pt states she has appt w/ dr Juliene Pinageoffrey later this week for poss knee surgery, but its not for certain. They may try injection.  Pt would like to know what surgery clearance this is for?

## 2014-03-10 NOTE — Anesthesia Procedure Notes (Addendum)
Anesthesia Regional Block:  Supraclavicular block  Pre-Anesthetic Checklist: ,, timeout performed, Correct Patient, Correct Site, Correct Laterality, Correct Procedure, Correct Position, site marked, Risks and benefits discussed,  Surgical consent,  Pre-op evaluation,  At surgeon's request and post-op pain management  Laterality: Right  Prep: chloraprep       Needles:  Injection technique: Single-shot  Needle Type: Echogenic Stimulator Needle     Needle Length: 9cm 9 cm Needle Gauge: 21 and 21 G    Additional Needles:  Procedures: ultrasound guided (picture in chart) and nerve stimulator Supraclavicular block  Nerve Stimulator or Paresthesia:  Response: 0.4 mA,   Additional Responses:   Narrative:  Start time: 03/10/2014 1:25 PM End time: 03/10/2014 1:40 PM Injection made incrementally with aspirations every 5 mL.  Performed by: Personally  Anesthesiologist: Arta BruceKevin Ossey MD  Additional Notes: Monitors applied. Patient sedated. Sterile prep and drape,hand hygiene and sterile gloves were used. Relevant anatomy identified.Needle position confirmed.Local anesthetic injected incrementally after negative aspiration. Local anesthetic spread visualized around nerve(s). Vascular puncture avoided. No complications. Image printed for medical record.The patient tolerated the procedure well.        Procedure Name: LMA Insertion Date/Time: 03/10/2014 1:56 PM Performed by: Burna CashONRAD, Demonta Wombles C Pre-anesthesia Checklist: Patient identified, Emergency Drugs available, Suction available and Patient being monitored Patient Re-evaluated:Patient Re-evaluated prior to inductionOxygen Delivery Method: Circle System Utilized Preoxygenation: Pre-oxygenation with 100% oxygen Intubation Type: IV induction Ventilation: Mask ventilation without difficulty LMA: LMA inserted LMA Size: 4.0 Number of attempts: 1 Airway Equipment and Method: bite block Placement Confirmation: positive ETCO2 Tube secured  with: Tape Dental Injury: Teeth and Oropharynx as per pre-operative assessment

## 2014-03-10 NOTE — Telephone Encounter (Signed)
Can you please schedule this patient for a Pre -Op clearance 30 min. appt . Thank you

## 2014-03-10 NOTE — Brief Op Note (Signed)
03/10/2014  2:42 PM  PATIENT:  Macy MisMary Ivie S Mcwhirter  53 y.o. female  PRE-OPERATIVE DIAGNOSIS:  MASS RIGHT HAND   POST-OPERATIVE DIAGNOSIS:  MASS RIGHT HAND   PROCEDURE:  Procedure(s): EXCISION MASS RIGHT HAND FIRST WEB SPACE DORSAL PALMAR INCISION  (Right)  SURGEON:  Surgeon(s) and Role:    * Cindee SaltGary Kaliope Quinonez, MD - Primary    * Betha LoaKevin Chemere Steffler, MD - Assisting  PHYSICIAN ASSISTANT:   ASSISTANTS: K Laray Rivkin,MD   ANESTHESIA:   regional and general  EBL:  Total I/O In: 1300 [I.V.:1300] Out: -   BLOOD ADMINISTERED:none  DRAINS: none   LOCAL MEDICATIONS USED:  NONE  SPECIMEN:  Excision  DISPOSITION OF SPECIMEN:  PATHOLOGY  COUNTS:  YES  TOURNIQUET:   Total Tourniquet Time Documented: Upper Arm (Right) - 28 minutes Total: Upper Arm (Right) - 28 minutes   DICTATION: .Other Dictation: Dictation Number [id number]  PLAN OF CARE: Discharge to home after PACU  PATIENT DISPOSITION:  PACU - hemodynamically stable.

## 2014-03-10 NOTE — Op Note (Signed)
Dictation Number [id number]

## 2014-03-10 NOTE — H&P (Signed)
[name] is a 53 year-old right-hand dominant female with a mass in her first web space of her right hand. This has been present for two to three months  She recalls no history of injury.  She complains of aching.  She states it is getting worse.   Grasping things seems to bother it.  Her mother has just died of cancer.  She is concerned that this may be a malignancy.  She has had her MRI done and this was read out as being a cyst, but it is right along the princeps pollicis artery connected to the ulnar artery in the deep arc, it is pressing directly against a large probable vein dorsally.  On Doppler it shows flow.  She has had her arteriogram done and this reveals that there is no aneurysm or vascular lesion in the right hand.    ALLERGIES:   Ceclor and tape bandages  MEDICATIONS:    Lisinopril, amlodipine, sertraline HCL, alprazolam, sertraline, zolpidem, pravastatin, vitamin D, melatonin, naproxen and promethazine PRN.  SURGICAL HISTORY:    She has had both hand trigger finger releases, uterine ablation, left knee arthroscopy, right knee arthroscopy.  FAMILY MEDICAL HISTORY:    Positive for heart disease and high blood pressure.   SOCIAL HISTORY:     She does not smoke or drink.  She is married.  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  REVIEW OF SYSTEMS:   Positive for loss of appetite, glasses, high blood pressure, asthma, cough, lumps, depression, nervousness, sleep disorder, headaches, easy bruising, bleeding, otherwise negative.  [name] is an 53 y.o. female.   Chief Complaint: mass right hand HPI: see above  Past Medical History  Diagnosis Date  . Chronic back pain   . Anxiety   . Depression   . Hypertension   . High cholesterol   . GERD (gastroesophageal reflux disease)   . Colon polyps   . Asthma   . Trigger finger of both hands 2007    thumb  . PONV (postoperative nausea and vomiting)   . Migraines     hx of   . Arthritis   .  Anemia     hx of with pregnancy   . Sleep apnea     does not use CPAP machine     Past Surgical History  Procedure Laterality Date  . Endometrial ablation w/ novasure    . Carpal tunnel release  2007    both hands  . Trigger finger release surgeyr       bilateral   . Tubal ligation    . Knee arthroscopy      let knee 03/2013   . Knee arthroscopy with medial menisectomy Right 10/22/2013    Procedure: RIGHT KNEE ARTHROSCOPY WITH MEDIAL MENISECTOMY, abrasion chrondrplasty Brigid Re/CLEAN OUT/CORRECT CURETTEMENT/BONE GRAFT/PROXIMAL TIBIA ;  Surgeon: Jacki Conesonald A Gioffre, MD;  Location: [facility];  Service: Orthopedics;  Laterality: Right;  . Back surgery  2012    lumb fusion  . Colonoscopy      Family History  Problem Relation Age of Onset  . Alcohol abuse Mother   . Arthritis Mother   . Hyperlipidemia Father   . Hypertension Father   . Heart disease Father [id number]    CABG  . Hypertension Sister   . Cancer Brother     colon, lung, liver  . Cancer Maternal Grandmother     breast  . Cancer Daughter     NEUROBLASTOMA   Social  History:  reports that she has never smoked. She has never used smokeless tobacco. She reports that she does not drink alcohol or use illicit drugs.  Allergies:  Allergies  Allergen Reactions  . Adhesive [Tape] Other (See Comments)    Peeled off skin  . Ceclor [Cefaclor] Other (See Comments)    Blisters on hands  . Chocolate Other (See Comments)    migraines  . Venlafaxine Other (See Comments)    "refuses to take - makes me feel like I am dying"    Medications Prior to Admission  Medication Sig Dispense Refill  . albuterol (PROVENTIL HFA;VENTOLIN HFA) 108 (90 BASE) MCG/ACT inhaler Inhale 2 puffs into the lungs every 6 (six) hours as needed for wheezing.  8.5 g  5  . ALPRAZolam (XANAX) 0.5 MG tablet Take 0.5 mg by mouth 2 (two) times daily.      Marland Kitchen amLODipine (NORVASC) 10 MG tablet Take 10 mg by mouth every morning.      . budesonide-formoterol (SYMBICORT) 160-4.5  MCG/ACT inhaler Inhale 1 puff into the lungs 2 (two) times daily.       . Cholecalciferol 1000 UNITS TBDP Take 2,000 Units by mouth at bedtime.      . furosemide (LASIX) 20 MG tablet Take 20 mg by mouth every morning.      Marland Kitchen ibuprofen (ADVIL,MOTRIN) 200 MG tablet Take 800 mg by mouth every 6 (six) hours as needed (For pain.).       Marland Kitchen lisinopril (PRINIVIL,ZESTRIL) 20 MG tablet Take 20 mg by mouth every morning.      . lithium carbonate 300 MG capsule Take 300 mg by mouth at bedtime.      . Melatonin 10 MG CAPS Take 20 mg by mouth at bedtime.      . montelukast (SINGULAIR) 10 MG tablet Take 10 mg by mouth at bedtime.      . Polyvinyl Alcohol-Povidone (REFRESH OP) Place 2 drops into both eyes daily as needed (For dry eyes.).       Marland Kitchen pravastatin (PRAVACHOL) 40 MG tablet Take 40 mg by mouth at bedtime.      . promethazine (PHENERGAN) 25 MG tablet Take 25 mg by mouth every 6 (six) hours as needed for nausea or vomiting.       . sertraline (ZOLOFT) 100 MG tablet Take 100 mg by mouth 2 (two) times daily.       Marland Kitchen zolpidem (AMBIEN CR) 12.5 MG CR tablet Take 12.5 mg by mouth at bedtime.        No results found for this or any previous visit (from the past 48 hour(s)).  No results found.   Pertinent items are noted in HPI.  Blood pressure 137/72, pulse 72, temperature 97.9 F (36.6 C), temperature source Oral, resp. rate 22, height 5\' 4"  (1.626 m), weight 129.729 kg (286 lb), SpO2 97.00%.  General appearance: alert, cooperative and appears stated age Head: Normocephalic, without obvious abnormality Neck: no JVD Resp: clear to auscultation bilaterally Cardio: regular rate and rhythm, S1, S2 normal, no murmur, click, rub or gallop GI: soft, non-tender; bowel sounds normal; no masses,  no organomegaly Extremities: mass first web space right hand Pulses: 2+ and symmetric Skin: Skin color, texture, turgor normal. No rashes or lesions Neurologic: Grossly  normal Incision/Wound: na  Assessment/Plan Mass right hand She would like to have the mass removed.  This is on both dorsal aspect of her right hand going through her first web space.   The risks and complications  are discussed.  She is aware there is no guarantee with the surgery, possibility of infection, recurrence, injury to arteries, nerves, tendons, incomplete relief of symptoms and dystrophy.   She is scheduled for excision mass right hand.  We will have a microscope available due to its location.  Teagan Ozawa R 03/10/2014, 12:59 PM

## 2014-03-10 NOTE — Anesthesia Postprocedure Evaluation (Signed)
Anesthesia Post Note  Patient: [name]  Procedure(s) Performed: Procedure(s) (LRB): EXCISION MASS RIGHT HAND FIRST WEB SPACE DORSAL PALMAR INCISION  (Right)  Anesthesia type: general  Patient location: PACU  Post pain: Pain level controlled  Post assessment: Patient's Cardiovascular Status Stable  Last Vitals:  Filed Vitals:   03/10/14 1515  BP: 115/68  Pulse: 104  Temp:   Resp: 18    Post vital signs: Reviewed and stable  Level of consciousness: sedated  Complications: No apparent anesthesia complications

## 2014-03-10 NOTE — Transfer of Care (Signed)
Immediate Anesthesia Transfer of Care Note  Patient: [name]  Procedure(s) Performed: Procedure(s): EXCISION MASS RIGHT HAND FIRST WEB SPACE DORSAL PALMAR INCISION  (Right)  Patient Location: PACU  Anesthesia Type:General  Level of Consciousness: awake and sedated  Airway & Oxygen Therapy: Patient Spontanous Breathing and Patient connected to face mask oxygen  Post-op Assessment: Report given to PACU RN and Post -op Vital signs reviewed and stable  Post vital signs: Reviewed and stable  Complications: No apparent anesthesia complications

## 2014-03-10 NOTE — Discharge Instructions (Addendum)
[facility] Instructions °Hand Surgery ° °Wound Care: °Keep your hand elevated above the level of your heart.  Do not allow it to dangle by your side.  Keep the dressing dry and do not remove it unless your doctor advises you to do so.  He will usually change it at the time of your post-op visit.  Moving your fingers is advised to stimulate circulation but will depend on the site of your surgery.  If you have a splint applied, your doctor will advise you regarding movement. ° °Activity: °Do not drive or operate machinery today.  Rest today and then you may return to your normal activity and work as indicated by your physician. ° °Diet:  °Drink liquids today or eat a light diet.  You may resume a regular diet tomorrow.   ° °General expectations: °Pain for two to three days. °Fingers may become slightly swollen. ° °Call your doctor if any of the following occur: °Severe pain not relieved by pain medication. °Elevated temperature. °Dressing soaked with blood. °Inability to move fingers. °White or bluish color to fingers. ° ° °Post Anesthesia Home Care Instructions ° °Activity: °Get plenty of rest for the remainder of the day. A responsible adult should stay with you for 24 hours following the procedure.  °For the next 24 hours, DO NOT: °-Drive a car °-Operate machinery °-Drink alcoholic beverages °-Take any medication unless instructed by your physician °-Make any legal decisions or sign important papers. ° °Meals: °Start with liquid foods such as gelatin or soup. Progress to regular foods as tolerated. Avoid greasy, spicy, heavy foods. If nausea and/or vomiting occur, drink only clear liquids until the nausea and/or vomiting subsides. Call your physician if vomiting continues. ° °Special Instructions/Symptoms: °Your throat may feel dry or sore from the anesthesia or the breathing tube placed in your throat during surgery. If this causes discomfort, gargle with warm salt water. The discomfort should disappear within 24  hours. ° ° °Regional Anesthesia Blocks ° °1. Numbness or the inability to move the "blocked" extremity may last from 3-48 hours after placement. The length of time depends on the medication injected and your individual response to the medication. If the numbness is not going away after 48 hours, call your surgeon. ° °2. The extremity that is blocked will need to be protected until the numbness is gone and the  Strength has returned. Because you cannot feel it, you will need to take extra care to avoid injury. Because it may be weak, you may have difficulty moving it or using it. You may not know what position it is in without looking at it while the block is in effect. ° °3. For blocks in the legs and feet, returning to weight bearing and walking needs to be done carefully. You will need to wait until the numbness is entirely gone and the strength has returned. You should be able to move your leg and foot normally before you try and bear weight or walk. You will need someone to be with you when you first try to ensure you do not fall and possibly risk injury. ° °4. Bruising and tenderness at the needle site are common side effects and will resolve in a few days. ° °5. Persistent numbness or new problems with movement should be communicated to the surgeon or the [facility] ([phone])/  Surgery Center ([phone]). °

## 2014-03-10 NOTE — Progress Notes (Signed)
Assisted Dr. Ossey with right, ultrasound guided, supraclavicular block. Side rails up, monitors on throughout procedure. See vital signs in flow sheet. Tolerated Procedure well. 

## 2014-03-10 NOTE — Telephone Encounter (Signed)
Lm on vm for pt to cb and sche 30 min ov.

## 2014-03-10 NOTE — Anesthesia Preprocedure Evaluation (Addendum)
Anesthesia Evaluation  Patient identified by MRN, date of birth, ID band Patient awake    Reviewed: Allergy & Precautions, H&P , NPO status , Patient's Chart, lab work & pertinent test results  History of Anesthesia Complications (+) PONV  Airway Mallampati: I TM Distance: >3 FB Neck ROM: Full    Dental   Pulmonary asthma , sleep apnea ,          Cardiovascular hypertension, Pt. on medications     Neuro/Psych Anxiety Depression    GI/Hepatic GERD-  Medicated and Controlled,  Endo/Other    Renal/GU      Musculoskeletal   Abdominal   Peds  Hematology   Anesthesia Other Findings   Reproductive/Obstetrics                          Anesthesia Physical Anesthesia Plan  ASA: II  Anesthesia Plan: General   Post-op Pain Management:    Induction: Intravenous  Airway Management Planned: LMA  Additional Equipment:   Intra-op Plan:   Post-operative Plan: Extubation in OR  Informed Consent: I have reviewed the patients History and Physical, chart, labs and discussed the procedure including the risks, benefits and alternatives for the proposed anesthesia with the patient or authorized representative who has indicated his/her understanding and acceptance.     Plan Discussed with: CRNA and Surgeon  Anesthesia Plan Comments:        Anesthesia Quick Evaluation

## 2014-03-11 ENCOUNTER — Encounter (HOSPITAL_BASED_OUTPATIENT_CLINIC_OR_DEPARTMENT_OTHER): Payer: Self-pay | Admitting: Orthopedic Surgery

## 2014-03-12 NOTE — Op Note (Deleted)
NAME:  [name], [name]           ACCOUNT NO.:  [id number]  MEDICAL RECORD NO.:  [id number]  LOCATION:                               FACILITY:  [facility]  PHYSICIAN:  Cindee SaltGary Talaysia Pinheiro, M.D.       DATE OF BIRTH:  1961-01-01  DATE OF PROCEDURE:  03/10/2014 DATE OF DISCHARGE:  03/10/2014                              OPERATIVE REPORT   PREOPERATIVE DIAGNOSIS:  Mass, first web space, right hand.  POSTOPERATIVE DIAGNOSIS:  Mass, first web space, right hand.  OPERATION:  Excision mass, first web space, right hand.  SURGEON:  Cindee SaltGary Derya Dettmann, M.D.  ASSISTANT:  Betha LoaKevin Darnetta Kesselman, MD  ANESTHESIA:  Supraclavicular block, general.  ANESTHESIOLOGIST:  Kaylyn LayerKevin D. Michelle Piperssey, M.D.  HISTORY:  The patient is a 53 year old female with a large mass on the dorsal aspect first web space of her right hand.  This was pulsatile in nature with positive Doppler.  MRI reveals this is a fluid filled, it travels from the dorsal aspect of the first web space and appears to rise at the carpometacarpal joint of the index and middle finger. Arteriogram reveals that it is not the artery, that the artery is directly next to the palmar stock.  She is desirous having this excised. Pre, peri, and postoperative courses have been discussed along with risks and complications.  She is aware that there is no guarantee with the surgery, possibility of infection; recurrence of injury to arteries, nerves, tendons; incomplete relief of symptoms; dystrophy.  In preoperative area, the patient is seen, the extremity marked by both patient and surgeon.  Antibiotic given.  PROCEDURE IN DETAIL:  The patient was brought to the operating room, where a general anesthetic was carried out without difficulty.  A supraclavicular block was carried out in the preoperative area under the direction of Dr. Michelle Piperssey.  She was prepped in supine position with the right arm free with ChloraPrep and 3-minute dry time was allowed.  Time- out taken confirming patient and  procedure.  The limb was exsanguinated with an Esmarch bandage.  Tourniquet placed high on the arm, it was inflated to 250 mmHg.  A linear incision was made directly over the mass, carried down through subcutaneous tissue.  This was at the midportion of the first web space.  Carried proximally down, the neurovascular bundles were identified and protected.  Veins were cauterized.  The dorsal sensory nerve was identified.  Retractors placed.  This was protected throughout the procedure.  The stalk was then followed down, the radial artery was noted at the carpometacarpal joint of the thumb, this was followed along with the stalk to the palmar aspect of the hand around the base of the index metacarpal.  The stalk of the cyst followed this down to the level of the carpometacarpal joint.  A palmar incision was not made.  The dissection was carried out with a IT sales professionalreer elevators and plantar Cruz elevators protecting the artery throughout the procedure.  An angled curette was then inserted with retractor to protect the radial artery.  The stalk of the cyst was removed along with the entire cyst.  This was sent to Pathology.  No bleeding occurred during this.  The wound was copiously  irrigated with saline.  The subcutaneous tissue was then closed with interrupted 4-0 Vicryl sutures and the skin with interrupted 4-0 Vicryl Rapide sutures. Sterile compressive dressing, splint supporting the thumb was applied. On deflation of the tourniquet, all fingers and thumb immediately pinked.  She was taken to the recovery room for observation in satisfactory condition.  She will be discharged home to return to the [facility] [place] in 1 week on Vicodin.          ______________________________ Cindee Salt, M.D.     GK/MEDQ  D:  03/10/2014  T:  03/10/2014  Job:  [id number]

## 2014-03-12 NOTE — Op Note (Signed)
NAME:  [name], [name]           ACCOUNT NO.:  [id number]  MEDICAL RECORD NO.:  [id number]  LOCATION:                               FACILITY:  [facility]  PHYSICIAN:  Cindee SaltGary Ravinder Lukehart, M.D.       DATE OF BIRTH:  1961-01-01  DATE OF PROCEDURE:  03/10/2014 DATE OF DISCHARGE:  03/10/2014                              OPERATIVE REPORT   PREOPERATIVE DIAGNOSIS:  Mass, first web space, right hand.  POSTOPERATIVE DIAGNOSIS:  Mass, first web space, right hand.  OPERATION:  Excision mass, first web space, right hand.  SURGEON:  Cindee SaltGary Shakeerah Gradel, M.D.  ASSISTANT:  Betha LoaKevin Terik Haughey, MD  ANESTHESIA:  Supraclavicular block, general.  ANESTHESIOLOGIST:  Kaylyn LayerKevin D. Michelle Piperssey, M.D.  HISTORY:  The patient is a 53 year old female with a large mass on the dorsal aspect first web space of her right hand.  This was pulsatile in nature with positive Doppler.  MRI reveals this is a fluid filled, it travels from the dorsal aspect of the first web space and appears to rise at the carpometacarpal joint of the index and middle finger. Arteriogram reveals that it is not the artery, that the artery is directly next to the palmar stock.  She is desirous having this excised. Pre, peri, and postoperative courses have been discussed along with risks and complications.  She is aware that there is no guarantee with the surgery, possibility of infection; recurrence of injury to arteries, nerves, tendons; incomplete relief of symptoms; dystrophy.  In preoperative area, the patient is seen, the extremity marked by both patient and surgeon.  Antibiotic given.  PROCEDURE IN DETAIL:  The patient was brought to the operating room, where a general anesthetic was carried out without difficulty.  A supraclavicular block was carried out in the preoperative area under the direction of Dr. Michelle Piperssey.  She was prepped in supine position with the right arm free with ChloraPrep and 3-minute dry time was allowed.  Time- out taken confirming patient and  procedure.  The limb was exsanguinated with an Esmarch bandage.  Tourniquet placed high on the arm, it was inflated to 250 mmHg.  A linear incision was made directly over the mass, carried down through subcutaneous tissue.  This was at the midportion of the first web space.  Carried proximally down, the neurovascular bundles were identified and protected.  Veins were cauterized.  The dorsal sensory nerve was identified.  Retractors placed.  This was protected throughout the procedure.  The stalk was then followed down, the radial artery was noted at the carpometacarpal joint of the thumb, this was followed along with the stalk to the palmar aspect of the hand around the base of the index metacarpal.  The stalk of the cyst followed this down to the level of the carpometacarpal joint.  A palmar incision was not made.  The dissection was carried out with a IT sales professionalreer elevators and plantar Cruz elevators protecting the artery throughout the procedure.  An angled curette was then inserted with retractor to protect the radial artery.  The stalk of the cyst was removed along with the entire cyst.  This was sent to Pathology.  No bleeding occurred during this.  The wound was copiously  irrigated with saline.  The subcutaneous tissue was then closed with interrupted 4-0 Vicryl sutures and the skin with interrupted 4-0 Vicryl Rapide sutures. Sterile compressive dressing, splint supporting the thumb was applied. On deflation of the tourniquet, all fingers and thumb immediately pinked.  She was taken to the recovery room for observation in satisfactory condition.  She will be discharged home to return to the [facility] of [place] in 1 week on Vicodin.          ______________________________ Cindee Salt, M.D.     GK/MEDQ  D:  03/10/2014  T:  03/10/2014  Job:  [id number]

## 2014-03-17 ENCOUNTER — Ambulatory Visit: Payer: BC Managed Care – PPO | Admitting: Family Medicine

## 2014-03-17 DIAGNOSIS — Z0289 Encounter for other administrative examinations: Secondary | ICD-10-CM

## 2014-03-25 ENCOUNTER — Ambulatory Visit (INDEPENDENT_AMBULATORY_CARE_PROVIDER_SITE_OTHER): Payer: BC Managed Care – PPO | Admitting: Family Medicine

## 2014-03-25 ENCOUNTER — Encounter: Payer: Self-pay | Admitting: Family Medicine

## 2014-03-25 VITALS — BP 130/90 | HR 80 | Temp 98.1°F | Wt 281.0 lb

## 2014-03-25 DIAGNOSIS — I10 Essential (primary) hypertension: Secondary | ICD-10-CM

## 2014-03-25 DIAGNOSIS — Z01818 Encounter for other preprocedural examination: Secondary | ICD-10-CM

## 2014-03-25 NOTE — Progress Notes (Signed)
Subjective:    Patient ID: [name], female    DOB: [birth year], 53 y.o.   MRN: [id number]  HPI Here for preoperative evaluation. Being scheduled for left total knee replacement. She had right knee surgery last March which went well. Patient does not have any history of CAD or peripheral vascular disease. Nonsmoker. No family history of premature CAD. No diabetes history. She does have hypertension and hyperlipidemia which are treated.  Patient had nuclear stress test March 2014 which was low-risk study. Echocardiogram at that time was unremarkable. She denies any recent chest pains. No dyspnea.  Past Medical History  Diagnosis Date  . Chronic back pain   . Anxiety   . Depression   . Hypertension   . High cholesterol   . GERD (gastroesophageal reflux disease)   . Colon polyps   . Asthma   . Trigger finger of both hands 2007    thumb  . PONV (postoperative nausea and vomiting)   . Migraines     hx of   . Arthritis   . Anemia     hx of with pregnancy   . Sleep apnea     does not use CPAP machine    Past Surgical History  Procedure Laterality Date  . Endometrial ablation w/ novasure    . Carpal tunnel release  2007    both hands  . Trigger finger release surgeyr       bilateral   . Tubal ligation    . Knee arthroscopy      let knee 03/2013   . Knee arthroscopy with medial menisectomy Right 10/22/2013    Procedure: RIGHT KNEE ARTHROSCOPY WITH MEDIAL MENISECTOMY, abrasion chrondrplasty Brigid Re OUT/CORRECT CURETTEMENT/BONE GRAFT/PROXIMAL TIBIA ;  Surgeon: Jacki Cones, MD;  Location: [facility];  Service: Orthopedics;  Laterality: Right;  . Back surgery  2012    lumb fusion  . Colonoscopy    . Excision metacarpal mass Right 03/10/2014    Procedure: EXCISION MASS RIGHT HAND FIRST WEB SPACE DORSAL PALMAR INCISION ;  Surgeon: Cindee Salt, MD;  Location: [facility];  Service: Orthopedics;  Laterality: Right;    reports that she has never smoked. She has  never used smokeless tobacco. She reports that she does not drink alcohol or use illicit drugs. family history includes Alcohol abuse in her mother; Arthritis in her mother; Cancer in her brother, daughter, and maternal grandmother; Heart disease (age of onset: 74) in her father; Hyperlipidemia in her father; Hypertension in her father and sister. Allergies  Allergen Reactions  . Adhesive [Tape] Other (See Comments)    Peeled off skin  . Ceclor [Cefaclor] Other (See Comments)    Blisters on hands  . Chocolate Other (See Comments)    migraines  . Venlafaxine Other (See Comments)    "refuses to take - makes me feel like I am dying"      Review of Systems  Constitutional: Negative for fatigue.  Eyes: Negative for visual disturbance.  Respiratory: Negative for cough, chest tightness, shortness of breath and wheezing.   Cardiovascular: Negative for chest pain, palpitations and leg swelling.  Neurological: Negative for dizziness, seizures, syncope, weakness, light-headedness and headaches.       Objective:   Physical Exam  Constitutional: She appears well-developed and well-nourished.  Neck: Neck supple. No thyromegaly present.  No carotid bruits  Cardiovascular: Normal rate and regular rhythm.   Pulmonary/Chest: Breath sounds normal. No respiratory distress. She has no wheezes. She has  no rales.  Musculoskeletal: She exhibits no edema.          Assessment & Plan:  Preoperative clearance. EKG is unremarkable. She's had cardiac testing including echocardiogram and nuclear stress test back in March 2014 which were normal. No contraindications for surgery at this time. Forms completed  Hypertension.  Stable  Encouraged to continue with weight loss

## 2014-03-25 NOTE — Progress Notes (Signed)
Pre visit review using our clinic review tool, if applicable. No additional management support is needed unless otherwise documented below in the visit note. 

## 2014-03-27 ENCOUNTER — Encounter (HOSPITAL_COMMUNITY): Payer: Self-pay | Admitting: Pharmacy Technician

## 2014-04-07 NOTE — Progress Notes (Signed)
Please put orders in Epic surgery 04-16-14 pre op 04-10-14 Thanks 

## 2014-04-08 NOTE — Progress Notes (Deleted)
                 Your procedure is scheduled on: Monday,April 07, 2014   Report to [facility] at [phone]  AM.  Call this number if you have problems the morning of surgery: [phone]   Remember: Bring insurance card and picture ID, packet from [facility] with most current medication record, history and physical   Do not drink liquids or  eat food:After Midnight. Sunday night 04-06-2014      Take these medicines the morning of surgery with A SIP OF WATER: Toprolol,              may take Haldol if needed for agitation                          SEE [place] PREPARING FOR SURGERY SHEET    Do not wear jewelry, make-up or nail polish.  Do not wear lotions, powders, or perfumes. You may wear deodorant.             Men may shave face and neck.  Do not bring valuables to the hospital.  Contacts, dentures or bridgework may not be worn into surgery.  Leave suitcase in the car. After surgery it may be brought to your room.  For patients admitted to the hospital, checkout time is 11:00 AM the day of                         discharge.   Patients discharged the day of surgery will not be allowed to drive home.  Name and phone number of your driver:      Please read over the following fact sheets that you were given:MRSA Information

## 2014-04-08 NOTE — Progress Notes (Signed)
Please put orders in Epic surgery 04-16-14 pre op 04-10-14 Thanks

## 2014-04-09 ENCOUNTER — Other Ambulatory Visit (HOSPITAL_COMMUNITY): Payer: Self-pay | Admitting: Orthopedic Surgery

## 2014-04-09 ENCOUNTER — Other Ambulatory Visit: Payer: Self-pay | Admitting: Surgical

## 2014-04-09 NOTE — H&P (Signed)
TOTAL KNEE ADMISSION H&P  Patient is being admitted for left total knee arthroplasty.  Subjective:  Chief Complaint:left knee pain.  HPI: [name], 53 y.o. female, has a history of pain and functional disability in the left knee due to arthritis and has failed non-surgical conservative treatments for greater than 12 weeks to includeNSAID's and/or analgesics, corticosteriod injections, viscosupplementation injections, use of assistive devices and activity modification.  Onset of symptoms was gradual, starting 5 years ago with gradually worsening course since that time. The patient noted prior procedures on the knee to include  arthroscopy and menisectomy on the left knee(s).  Patient currently rates pain in the left knee(s) at 7 out of 10 with activity. Patient has night pain, worsening of pain with activity and weight bearing, pain that interferes with activities of daily living, pain with passive range of motion, crepitus and joint swelling.  Patient has evidence of periarticular osteophytes and joint space narrowing by imaging studies.There is no active infection.  Patient Active Problem List   Diagnosis Date Noted  . Aneurysmal bone cyst, right proximal tibia 10/22/2013  . Osteoarthritis of both knees 10/22/2013  . Complex tear of medial meniscus of right knee as current injury 10/22/2013  . Chronic insomnia 10/27/2012  . Asthma, mild intermittent 10/26/2012  . Osteoarthritis 10/26/2012  . Personal history of colonic polyps 10/26/2012  . GERD (gastroesophageal reflux disease) 10/26/2012  . Essential hypertension, benign 10/26/2012  . Shortness of breath 10/02/2012  . Precordial pain 10/02/2012  . Depression   . High cholesterol   . Migraines    Past Medical History  Diagnosis Date  . Chronic back pain   . Anxiety   . Depression   . Hypertension   . High cholesterol   . GERD (gastroesophageal reflux disease)   . Colon polyps   . Asthma   . Trigger finger of both  hands 2007    thumb  . PONV (postoperative nausea and vomiting)   . Migraines     hx of   . Arthritis   . Anemia     hx of with pregnancy   . Sleep apnea     does not use CPAP machine     Past Surgical History  Procedure Laterality Date  . Endometrial ablation w/ novasure    . Carpal tunnel release  2007    both hands  . Trigger finger release surgeyr       bilateral   . Tubal ligation    . Knee arthroscopy      let knee 03/2013   . Knee arthroscopy with medial menisectomy Right 10/22/2013    Procedure: RIGHT KNEE ARTHROSCOPY WITH MEDIAL MENISECTOMY, abrasion chrondrplasty Brigid Re OUT/CORRECT CURETTEMENT/BONE GRAFT/PROXIMAL TIBIA ;  Surgeon: Jacki Cones, MD;  Location: [facility];  Service: Orthopedics;  Laterality: Right;  . Back surgery  2012    lumb fusion  . Colonoscopy    . Excision metacarpal mass Right 03/10/2014    Procedure: EXCISION MASS RIGHT HAND FIRST WEB SPACE DORSAL PALMAR INCISION ;  Surgeon: Cindee Salt, MD;  Location: [facility];  Service: Orthopedics;  Laterality: Right;     Current outpatient prescriptions: albuterol (PROVENTIL HFA;VENTOLIN HFA) 108 (90 BASE) MCG/ACT inhaler, Inhale 2 puffs into the lungs every 6 (six) hours as needed for wheezing., Disp: 8.5 g, Rfl: 5;   ALPRAZolam (XANAX) 0.5 MG tablet, Take 0.5 mg by mouth 2 (two) times daily., Disp: , Rfl: ;   amLODipine (NORVASC) 10 MG  tablet, Take 10 mg by mouth every morning., Disp: , Rfl:  budesonide-formoterol (SYMBICORT) 160-4.5 MCG/ACT inhaler, Inhale 1 puff into the lungs 2 (two) times daily. , Disp: , Rfl: ;   furosemide (LASIX) 20 MG tablet, Take 20 mg by mouth every morning., Disp: , Rfl: ;   HYDROcodone-acetaminophen (NORCO/VICODIN) 5-325 MG per tablet, Take 1 tablet by mouth every 4 (four) hours as needed for moderate pain., Disp: , Rfl:  lisinopril (PRINIVIL,ZESTRIL) 20 MG tablet, Take 20 mg by mouth every morning., Disp: , Rfl: ;   lithium carbonate 300 MG capsule, Take 300 mg by  mouth at bedtime., Disp: , Rfl: ;   montelukast (SINGULAIR) 10 MG tablet, Take 10 mg by mouth at bedtime., Disp: , Rfl: ;   Polyvinyl Alcohol-Povidone (REFRESH OP), Place 2 drops into both eyes daily as needed (For dry eyes.). , Disp: , Rfl:  pravastatin (PRAVACHOL) 40 MG tablet, Take 40 mg by mouth at bedtime., Disp: , Rfl: ;   promethazine (PHENERGAN) 25 MG tablet, Take 25 mg by mouth every 6 (six) hours as needed for nausea or vomiting. , Disp: , Rfl: ;   sertraline (ZOLOFT) 100 MG tablet, Take 100 mg by mouth 2 (two) times daily. , Disp: , Rfl: ;   zolpidem (AMBIEN CR) 12.5 MG CR tablet, Take 12.5 mg by mouth at bedtime., Disp: , Rfl:   Allergies  Allergen Reactions  . Adhesive [Tape] Other (See Comments)    Peeled off skin  . Ceclor [Cefaclor] Other (See Comments)    Blisters on hands  . Chocolate Other (See Comments)    migraines  . Venlafaxine Other (See Comments)    "refuses to take - makes me feel like I am dying"    History  Substance Use Topics  . Smoking status: Never Smoker   . Smokeless tobacco: Never Used  . Alcohol Use: No    Family History  Problem Relation Age of Onset  . Alcohol abuse Mother   . Arthritis Mother   . Hyperlipidemia Father   . Hypertension Father   . Heart disease Father 51    CABG  . Hypertension Sister   . Cancer Brother     colon, lung, liver  . Cancer Maternal Grandmother     breast  . Cancer Daughter     NEUROBLASTOMA     Review of Systems  Constitutional: Negative.   HENT: Negative.   Eyes: Negative.   Respiratory: Positive for shortness of breath. Negative for cough, hemoptysis, sputum production and wheezing.        SOB with exertion  Cardiovascular: Negative.   Gastrointestinal: Negative.   Genitourinary: Negative.   Musculoskeletal: Positive for back pain and joint pain. Negative for falls, myalgias and neck pain.       Bilateral knee pain  Skin: Negative.   Neurological: Negative.   Endo/Heme/Allergies: Negative.    Psychiatric/Behavioral: Negative for depression, suicidal ideas, hallucinations, memory loss and substance abuse. The patient has insomnia. The patient is not nervous/anxious.     Objective:  Physical Exam  Constitutional: She is oriented to person, place, and time. She appears well-developed.  Morbidly obese  HENT:  Head: Normocephalic and atraumatic.  Right Ear: External ear normal.  Left Ear: External ear normal.  Nose: Nose normal.  Mouth/Throat: Oropharynx is clear and moist.  Eyes: Conjunctivae and EOM are normal.  Neck: Normal range of motion. Neck supple.  Cardiovascular: Normal rate, regular rhythm, normal heart sounds and intact distal pulses.  No murmur heard. Respiratory: Effort normal and breath sounds normal. No respiratory distress. She has no wheezes.  GI: Soft. Bowel sounds are normal. She exhibits no distension. There is no tenderness.  Musculoskeletal:       Right hip: Normal.       Left hip: Normal.       Right knee: She exhibits decreased range of motion and swelling. She exhibits no effusion and no erythema. Tenderness found. Medial joint line and lateral joint line tenderness noted.       Left knee: She exhibits decreased range of motion and swelling. She exhibits no effusion and no erythema. Tenderness found. Medial joint line and lateral joint line tenderness noted.       Right lower leg: She exhibits no tenderness and no swelling.       Left lower leg: She exhibits no tenderness and no swelling.  Neurological: She is oriented to person, place, and time. She has normal strength and normal reflexes. No sensory deficit.  Skin: No rash noted. No erythema.  Psychiatric: She has a normal mood and affect. Her behavior is normal.    Vitals Weight: 295 lb Height: 65in Body Surface Area: 2.48 m Body Mass Index: 49.09 kg/m Pulse: 72 (Regular)  BP: 124/78 (Sitting, Left Arm, Standard)  Imaging Review Plain radiographs demonstrate severe degenerative  joint disease of the left knee(s). The overall alignment ismild varus. The bone quality appears to be good for age and reported activity level.  Assessment/Plan:  End stage arthritis, left knee   The patient history, physical examination, clinical judgment of the provider and imaging studies are consistent with end stage degenerative joint disease of the left knee(s) and total knee arthroplasty is deemed medically necessary. The treatment options including medical management, injection therapy arthroscopy and arthroplasty were discussed at length. The risks and benefits of total knee arthroplasty were presented and reviewed. The risks due to aseptic loosening, infection, stiffness, patella tracking problems, thromboembolic complications and other imponderables were discussed. The patient acknowledged the explanation, agreed to proceed with the plan and consent was signed. Patient is being admitted for inpatient treatment for surgery, pain control, PT, OT, prophylactic antibiotics, VTE prophylaxis, progressive ambulation and ADL's and discharge planning. The patient is planning to be discharged home with home health services   TXA   PCP: Sonda Primes, PA-C

## 2014-04-09 NOTE — Progress Notes (Signed)
Echo 10-11-12 epic Myocardial perfusion 10-12-12 epic ekg 03-25-14 epic Medical clearance note dr Caryl Never dated 03-25-14 on chart for 04-16-14 surgery

## 2014-04-10 ENCOUNTER — Telehealth: Payer: Self-pay | Admitting: Family Medicine

## 2014-04-10 ENCOUNTER — Inpatient Hospital Stay (HOSPITAL_COMMUNITY)
Admission: RE | Admit: 2014-04-10 | Discharge: 2014-04-10 | Disposition: A | Discharge status: Home / Self Care | Payer: BC Managed Care – PPO | Source: Ambulatory Visit

## 2014-04-10 MED ORDER — AZITHROMYCIN 250 MG PO TABS: ORAL_TABLET | ORAL | Status: DC

## 2014-04-10 NOTE — Telephone Encounter (Signed)
Transferred pt to triage.

## 2014-04-10 NOTE — Telephone Encounter (Signed)
Rx sent to pharmacy and pt is aware. 

## 2014-04-10 NOTE — Telephone Encounter (Signed)
May start Zithromax (Z-pack).

## 2014-04-10 NOTE — Telephone Encounter (Signed)
Patient Information:  Caller Name: Vaniah  Phone: [phone]  Patient: [name], [name]  Gender: Female  DOB: 1961/04/12  Age: 53 Years  PCP: Evelena Peat [facility])  Pregnant: No  Office Follow Up:  Does the office need to follow up with this patient?: Yes  Instructions For The Office: Please f/u with pt concerning Rx, thank you.   Symptoms  Reason For Call & Symptoms: Pt repirts she has sinus pain and nasal green discharge.  Pt reports she is having surgery (Left total knee replacement) next Wednesday 04/16/14.  Pt requesting Rx called in to [facility] [place].  Pt reports she would like to avoid coming into office due to getting sick with something else.  Reviewed Health History In EMR: Yes  Reviewed Medications In EMR: Yes  Reviewed Allergies In EMR: Yes  Reviewed Surgeries / Procedures: Yes  Date of Onset of Symptoms: 04/07/2014 OB / GYN:  LMP: Unknown  Guideline(s) Used:  Sinus Pain and Congestion  Disposition Per Guideline:   See Today or Tomorrow in Office  Reason For Disposition Reached:   Using nasal washes and pain medicine > 24 hours and sinus pain (lower forehead, cheekbone, or eye) persists  Advice Given:  For a Stuffy Nose - Use Nasal Washes:  Introduction: Saline (salt water) nasal irrigation (nasal wash) is an effective and simple home remedy for treating stuffy nose and sinus congestion. The nose can be irrigated by pouring, spraying, or squirting salt water into the nose and then letting it run back out.  How it Helps: The salt water rinses out excess mucus, washes out any irritants (dust, allergens) that might be present, and moistens the nasal cavity.  How to Make Saline [facility] Water) Nasal Wash :  You can make your own saline nasal wash.  Add 1/2 tsp of table salt to 1 cup (8 oz; 240 ml) of warm water.  You should use sterile, distilled, or previously boiled water for nasal irrigation.  For a Stuffy Nose - Use Nasal Washes:  Introduction: Saline (salt  water) nasal irrigation (nasal wash) is an effective and simple home remedy for treating stuffy nose and sinus congestion. The nose can be irrigated by pouring, spraying, or squirting salt water into the nose and then letting it run back out.  How it Helps: The salt water rinses out excess mucus, washes out any irritants (dust, allergens) that might be present, and moistens the nasal cavity.  How to Make Saline [facility] Water) Nasal Wash :  You can make your own saline nasal wash.  Add 1/2 tsp of table salt to 1 cup (8 oz; 240 ml) of warm water.  You should use sterile, distilled, or previously boiled water for nasal irrigation.  Hydration:  Drink plenty of liquids (6-8 glasses of water daily). If the air in your home is dry, use a cool mist humidifier  Call Back If:   You become worse.  Patient Will Follow Care Advice:  YES

## 2014-04-11 NOTE — Patient Instructions (Addendum)
[name]  04/11/2014                           YOUR PROCEDURE IS SCHEDULED ON:  04/16/14               ENTER THRU [facility] MAIN HOSPITAL ENTRANCE AND                           FOLLOW  SIGNS TO SHORT STAY CENTER                 ARRIVE AT SHORT STAY AT:  8:00 AM               CALL THIS NUMBER IF ANY PROBLEMS THE DAY OF SURGERY :               [phone]                                REMEMBER:   Do not eat food or drink liquids AFTER MIDNIGHT                  Take these medicines the morning of surgery with               A SIPS OF WATER :   XANAX / AMLODIPINE / SYMBICORT / ZOLOFT / MAY TAKE HYDROCODONE IF NEEDED      Do not wear jewelry, make-up   Do not wear lotions, powders, or perfumes.   Do not shave legs or underarms 12 hrs. before surgery (men may shave face)  Do not bring valuables to the hospital.  Contacts, dentures or bridgework may not be worn into surgery.  Leave suitcase in the car. After surgery it may be brought to your room.  For patients admitted to the hospital more than one night, checkout time is            11:00 AM                                                       ________________________________________________________________________                                                                                                  [place] - PREPARING FOR SURGERY  Before surgery, you can play an important role.  Because skin is not sterile, your skin needs to be as free of germs as possible.  You can reduce the number of germs on your skin by washing with CHG (chlorahexidine gluconate) soap before surgery.  CHG is an antiseptic cleaner which kills germs and bonds with the skin to continue killing germs even after washing. Please DO NOT use if you have an allergy to CHG or antibacterial soaps.  If your skin becomes  reddened/irritated stop using the CHG and inform your nurse when you arrive at Short Stay. Do not shave  (including legs and underarms) for at least 48 hours prior to the first CHG shower.  You may shave your face. Please follow these instructions carefully:   1.  Shower with CHG Soap the night before surgery and the  morning of Surgery.   2.  If you choose to wash your hair, wash your hair first as usual with your  normal  Shampoo.   3.  After you shampoo, rinse your hair and body thoroughly to remove the  shampoo.                                         4.  Use CHG as you would any other liquid soap.  You can apply chg directly  to the skin and wash . Gently wash with scrungie or clean wascloth    5.  Apply the CHG Soap to your body ONLY FROM THE NECK DOWN.   Do not use on open                           Wound or open sores. Avoid contact with eyes, ears mouth and genitals (private parts).                        Genitals (private parts) with your normal soap.              6.  Wash thoroughly, paying special attention to the area where your surgery  will be performed.   7.  Thoroughly rinse your body with warm water from the neck down.   8.  DO NOT shower/wash with your normal soap after using and rinsing off  the CHG Soap .                9.  Pat yourself dry with a clean towel.             10.  Wear clean pajamas.             11.  Place clean sheets on your bed the night of your first shower and do not  sleep with pets.  Day of Surgery : Do not apply any lotions/deodorants the morning of surgery.  Please wear clean clothes to the hospital/surgery center.  FAILURE TO FOLLOW THESE INSTRUCTIONS MAY RESULT IN THE CANCELLATION OF YOUR SURGERY    PATIENT SIGNATURE_________________________________  ______________________________________________________________________    WHAT IS A BLOOD TRANSFUSION? Blood Transfusion Information  A transfusion is the replacement of blood or some of its parts. Blood is made up of multiple cells which provide different functions.  Red blood cells  carry oxygen and are used for blood loss replacement.  White blood cells fight against infection.  Platelets control bleeding.  Plasma helps clot blood.  Other blood products are available for specialized needs, such as hemophilia or other clotting disorders. BEFORE THE TRANSFUSION  Who gives blood for transfusions?   Healthy volunteers who are fully evaluated to make sure their blood is safe. This is blood bank blood. Transfusion therapy is the safest it has ever been in the practice of medicine. Before blood is taken from a donor, a complete history is taken to make sure that person has no history of diseases nor  engages in risky social behavior (examples are intravenous drug use or sexual activity with multiple partners). The donor's travel history is screened to minimize risk of transmitting infections, such as malaria. The donated blood is tested for signs of infectious diseases, such as HIV and hepatitis. The blood is then tested to be sure it is compatible with you in order to minimize the chance of a transfusion reaction. If you or a relative donates blood, this is often done in anticipation of surgery and is not appropriate for emergency situations. It takes many days to process the donated blood. RISKS AND COMPLICATIONS Although transfusion therapy is very safe and saves many lives, the main dangers of transfusion include:   Getting an infectious disease.  Developing a transfusion reaction. This is an allergic reaction to something in the blood you were given. Every precaution is taken to prevent this. The decision to have a blood transfusion has been considered carefully by your caregiver before blood is given. Blood is not given unless the benefits outweigh the risks. AFTER THE TRANSFUSION  Right after receiving a blood transfusion, you will usually feel much better and more energetic. This is especially true if your red blood cells have gotten low (anemic). The transfusion raises  the level of the red blood cells which carry oxygen, and this usually causes an energy increase.  The nurse administering the transfusion will monitor you carefully for complications. HOME CARE INSTRUCTIONS  No special instructions are needed after a transfusion. You may find your energy is better. Speak with your caregiver about any limitations on activity for underlying diseases you may have. SEEK MEDICAL CARE IF:   Your condition is not improving after your transfusion.  You develop redness or irritation at the intravenous (IV) site. SEEK IMMEDIATE MEDICAL CARE IF:  Any of the following symptoms occur over the next 12 hours:  Shaking chills.  You have a temperature by mouth above 102 F (38.9 C), not controlled by medicine.  Chest, back, or muscle pain.  People around you feel you are not acting correctly or are confused.  Shortness of breath or difficulty breathing.  Dizziness and fainting.  You get a rash or develop hives.  You have a decrease in urine output.  Your urine turns a dark color or changes to pink, red, or brown. Any of the following symptoms occur over the next 10 days:  You have a temperature by mouth above 102 F (38.9 C), not controlled by medicine.  Shortness of breath.  Weakness after normal activity.  The white part of the eye turns yellow (jaundice).  You have a decrease in the amount of urine or are urinating less often.  Your urine turns a dark color or changes to pink, red, or brown. Document Released: 07/08/2000 Document Revised: 10/03/2011 Document Reviewed: 02/25/2008 [facility] Patient Information 2014 [place], [place].  _______________________________________________________________________

## 2014-04-14 ENCOUNTER — Encounter (HOSPITAL_COMMUNITY): Payer: Self-pay

## 2014-04-14 ENCOUNTER — Ambulatory Visit (HOSPITAL_COMMUNITY)
Admission: RE | Admit: 2014-04-14 | Discharge: 2014-04-14 | Disposition: A | Discharge status: Home / Self Care | Payer: BC Managed Care – PPO | Source: Ambulatory Visit | Attending: Anesthesiology | Admitting: Anesthesiology

## 2014-04-14 ENCOUNTER — Encounter (HOSPITAL_COMMUNITY)
Admission: RE | Admit: 2014-04-14 | Discharge: 2014-04-14 | Disposition: A | Discharge status: Home / Self Care | Payer: BC Managed Care – PPO | Source: Ambulatory Visit | Attending: Orthopedic Surgery | Admitting: Orthopedic Surgery

## 2014-04-14 DIAGNOSIS — M24569 Contracture, unspecified knee: Secondary | ICD-10-CM | POA: Diagnosis not present

## 2014-04-14 DIAGNOSIS — M1710 Unilateral primary osteoarthritis, unspecified knee: Secondary | ICD-10-CM | POA: Insufficient documentation

## 2014-04-14 DIAGNOSIS — I10 Essential (primary) hypertension: Secondary | ICD-10-CM | POA: Diagnosis not present

## 2014-04-14 DIAGNOSIS — Z01812 Encounter for preprocedural laboratory examination: Secondary | ICD-10-CM | POA: Diagnosis present

## 2014-04-14 DIAGNOSIS — Z01810 Encounter for preprocedural cardiovascular examination: Secondary | ICD-10-CM | POA: Insufficient documentation

## 2014-04-14 LAB — URINALYSIS, ROUTINE W REFLEX MICROSCOPIC
Bilirubin Urine: NEGATIVE
Glucose, UA: NEGATIVE mg/dL
Hgb urine dipstick: NEGATIVE
Ketones, ur: NEGATIVE mg/dL
Leukocytes, UA: NEGATIVE
Nitrite: NEGATIVE
Protein, ur: NEGATIVE mg/dL
Specific Gravity, Urine: 1.022 (ref 1.005–1.030)
Urobilinogen, UA: 0.2 mg/dL (ref 0.0–1.0)
pH: 5 (ref 5.0–8.0)

## 2014-04-14 LAB — BASIC METABOLIC PANEL
Anion gap: 12 (ref 5–15)
BUN: 11 mg/dL (ref 6–23)
CO2: 26 mEq/L (ref 19–32)
Calcium: 10 mg/dL (ref 8.4–10.5)
Chloride: 97 mEq/L (ref 96–112)
Creatinine, Ser: 0.74 mg/dL (ref 0.50–1.10)
GFR calc Af Amer: >90 mL/min (ref >90)
GFR calc non Af Amer: >90 mL/min (ref >90)
Glucose, Bld: 100 mg/dL — ABNORMAL HIGH (ref 70–99)
Potassium: 4.9 mEq/L (ref 3.7–5.3)
Sodium: 135 mEq/L — ABNORMAL LOW (ref 137–147)

## 2014-04-14 LAB — SURGICAL PCR SCREEN
MRSA, PCR: NEGATIVE
Staphylococcus aureus: NEGATIVE

## 2014-04-14 LAB — CBC
HCT: 38.9 % (ref 36.0–46.0)
Hemoglobin: 13.1 g/dL (ref 12.0–15.0)
MCH: 29.3 pg (ref 26.0–34.0)
MCHC: 33.7 g/dL (ref 30.0–36.0)
MCV: 87 fL (ref 78.0–100.0)
Platelets: 241 10*3/uL (ref 150–400)
RBC: 4.47 MIL/uL (ref 3.87–5.11)
RDW: 13.7 % (ref 11.5–15.5)
WBC: 7.4 10*3/uL (ref 4.0–10.5)

## 2014-04-14 LAB — APTT: aPTT: 32 seconds (ref 24–37)

## 2014-04-14 LAB — PROTIME-INR
INR: 0.98 (ref 0.00–1.49)
Prothrombin Time: 13 seconds (ref 11.6–15.2)

## 2014-04-14 LAB — ABO/RH: ABO/RH(D): A POS

## 2014-04-16 ENCOUNTER — Other Ambulatory Visit: Payer: Self-pay | Admitting: Surgical

## 2014-04-16 ENCOUNTER — Encounter (HOSPITAL_COMMUNITY): Payer: BC Managed Care – PPO | Admitting: *Deleted

## 2014-04-16 ENCOUNTER — Encounter (HOSPITAL_COMMUNITY): Payer: Self-pay | Admitting: *Deleted

## 2014-04-16 ENCOUNTER — Inpatient Hospital Stay (HOSPITAL_COMMUNITY): Payer: BC Managed Care – PPO | Admitting: *Deleted

## 2014-04-16 ENCOUNTER — Inpatient Hospital Stay (HOSPITAL_COMMUNITY)
Admission: RE | Admit: 2014-04-16 | Discharge: 2014-04-18 | DRG: 470 | Disposition: A | Discharge status: Home Health | Payer: BC Managed Care – PPO | Source: Ambulatory Visit | Attending: Orthopedic Surgery | Admitting: Orthopedic Surgery

## 2014-04-16 ENCOUNTER — Encounter (HOSPITAL_COMMUNITY)
Admission: RE | Disposition: A | Discharge status: Home Health | Payer: Self-pay | Source: Ambulatory Visit | Attending: Orthopedic Surgery

## 2014-04-16 DIAGNOSIS — J45909 Unspecified asthma, uncomplicated: Secondary | ICD-10-CM | POA: Diagnosis present

## 2014-04-16 DIAGNOSIS — F411 Generalized anxiety disorder: Secondary | ICD-10-CM | POA: Diagnosis present

## 2014-04-16 DIAGNOSIS — M25569 Pain in unspecified knee: Secondary | ICD-10-CM | POA: Diagnosis present

## 2014-04-16 DIAGNOSIS — F329 Major depressive disorder, single episode, unspecified: Secondary | ICD-10-CM | POA: Diagnosis present

## 2014-04-16 DIAGNOSIS — Z888 Allergy status to other drugs, medicaments and biological substances status: Secondary | ICD-10-CM | POA: Diagnosis not present

## 2014-04-16 DIAGNOSIS — Z800 Family history of malignant neoplasm of digestive organs: Secondary | ICD-10-CM | POA: Diagnosis not present

## 2014-04-16 DIAGNOSIS — Z6841 Body Mass Index (BMI) 40.0 and over, adult: Secondary | ICD-10-CM | POA: Diagnosis not present

## 2014-04-16 DIAGNOSIS — G4700 Insomnia, unspecified: Secondary | ICD-10-CM | POA: Diagnosis present

## 2014-04-16 DIAGNOSIS — Z8249 Family history of ischemic heart disease and other diseases of the circulatory system: Secondary | ICD-10-CM

## 2014-04-16 DIAGNOSIS — Z808 Family history of malignant neoplasm of other organs or systems: Secondary | ICD-10-CM

## 2014-04-16 DIAGNOSIS — Z79899 Other long term (current) drug therapy: Secondary | ICD-10-CM | POA: Diagnosis not present

## 2014-04-16 DIAGNOSIS — Z9109 Other allergy status, other than to drugs and biological substances: Secondary | ICD-10-CM | POA: Diagnosis not present

## 2014-04-16 DIAGNOSIS — M24569 Contracture, unspecified knee: Secondary | ICD-10-CM | POA: Diagnosis present

## 2014-04-16 DIAGNOSIS — M25469 Effusion, unspecified knee: Secondary | ICD-10-CM | POA: Diagnosis present

## 2014-04-16 DIAGNOSIS — K219 Gastro-esophageal reflux disease without esophagitis: Secondary | ICD-10-CM | POA: Diagnosis present

## 2014-04-16 DIAGNOSIS — Z96659 Presence of unspecified artificial knee joint: Secondary | ICD-10-CM

## 2014-04-16 DIAGNOSIS — Z96652 Presence of left artificial knee joint: Secondary | ICD-10-CM

## 2014-04-16 DIAGNOSIS — G8929 Other chronic pain: Secondary | ICD-10-CM | POA: Diagnosis present

## 2014-04-16 DIAGNOSIS — Z8349 Family history of other endocrine, nutritional and metabolic diseases: Secondary | ICD-10-CM

## 2014-04-16 DIAGNOSIS — Z860100 Personal history of colon polyps, unspecified: Secondary | ICD-10-CM

## 2014-04-16 DIAGNOSIS — Z881 Allergy status to other antibiotic agents status: Secondary | ICD-10-CM | POA: Diagnosis not present

## 2014-04-16 DIAGNOSIS — M549 Dorsalgia, unspecified: Secondary | ICD-10-CM | POA: Diagnosis present

## 2014-04-16 DIAGNOSIS — F3289 Other specified depressive episodes: Secondary | ICD-10-CM | POA: Diagnosis present

## 2014-04-16 DIAGNOSIS — Z91018 Allergy to other foods: Secondary | ICD-10-CM | POA: Diagnosis not present

## 2014-04-16 DIAGNOSIS — G4730 Sleep apnea, unspecified: Secondary | ICD-10-CM | POA: Diagnosis present

## 2014-04-16 DIAGNOSIS — Z801 Family history of malignant neoplasm of trachea, bronchus and lung: Secondary | ICD-10-CM

## 2014-04-16 DIAGNOSIS — E7800 Pure hypercholesterolemia, unspecified: Secondary | ICD-10-CM | POA: Diagnosis present

## 2014-04-16 DIAGNOSIS — Z8261 Family history of arthritis: Secondary | ICD-10-CM | POA: Diagnosis not present

## 2014-04-16 DIAGNOSIS — M1710 Unilateral primary osteoarthritis, unspecified knee: Principal | ICD-10-CM | POA: Diagnosis present

## 2014-04-16 DIAGNOSIS — I10 Essential (primary) hypertension: Secondary | ICD-10-CM | POA: Diagnosis present

## 2014-04-16 DIAGNOSIS — Z803 Family history of malignant neoplasm of breast: Secondary | ICD-10-CM

## 2014-04-16 HISTORY — PX: TOTAL KNEE ARTHROPLASTY: SHX125

## 2014-04-16 HISTORY — DX: Presence of unspecified artificial knee joint: Z96.659

## 2014-04-16 LAB — TYPE AND SCREEN
ABO/RH(D): A POS
Antibody Screen: NEGATIVE

## 2014-04-16 SURGERY — ARTHROPLASTY, KNEE, TOTAL
Anesthesia: General | Site: Knee | Laterality: Left

## 2014-04-16 MED ORDER — FERROUS SULFATE 325 (65 FE) MG PO TABS
325.0000 mg | ORAL_TABLET | Freq: Three times a day (TID) | ORAL | Status: DC
Start: 2014-04-16 — End: 2014-04-18
  Administered 2014-04-16 – 2014-04-18 (×5): 325 mg via ORAL
  Filled 2014-04-16 (×8): qty 1

## 2014-04-16 MED ORDER — ONDANSETRON HCL 4 MG PO TABS: 4.0000 mg | ORAL_TABLET | Freq: Four times a day (QID) | ORAL | Status: DC | PRN

## 2014-04-16 MED ORDER — ACETAMINOPHEN 650 MG RE SUPP: 650.0000 mg | Freq: Four times a day (QID) | RECTAL | Status: DC | PRN

## 2014-04-16 MED ORDER — NEOSTIGMINE METHYLSULFATE 10 MG/10ML IV SOLN
INTRAVENOUS | Status: AC
  Filled 2014-04-16: qty 1

## 2014-04-16 MED ORDER — FLEET ENEMA 7-19 GM/118ML RE ENEM: 1.0000 | ENEMA | Freq: Once | RECTAL | Status: AC | PRN

## 2014-04-16 MED ORDER — THROMBIN 5000 UNITS EX SOLR
CUTANEOUS | Status: DC | PRN
  Administered 2014-04-16: 5000 [IU] via TOPICAL

## 2014-04-16 MED ORDER — BUDESONIDE-FORMOTEROL FUMARATE 160-4.5 MCG/ACT IN AERO
1.0000 | INHALATION_SPRAY | Freq: Two times a day (BID) | RESPIRATORY_TRACT | Status: DC
  Administered 2014-04-16 – 2014-04-18 (×4): 1 via RESPIRATORY_TRACT
  Filled 2014-04-16: qty 6

## 2014-04-16 MED ORDER — LIDOCAINE HCL (CARDIAC) 20 MG/ML IV SOLN
INTRAVENOUS | Status: DC | PRN
  Administered 2014-04-16: 100 mg via INTRAVENOUS

## 2014-04-16 MED ORDER — HYDROCODONE-ACETAMINOPHEN 5-325 MG PO TABS
1.0000 | ORAL_TABLET | ORAL | Status: DC | PRN
  Administered 2014-04-17 – 2014-04-18 (×2): 1 via ORAL
  Filled 2014-04-16: qty 1

## 2014-04-16 MED ORDER — GLYCOPYRROLATE 0.2 MG/ML IJ SOLN
INTRAMUSCULAR | Status: AC
Start: 2014-04-16 — End: 2014-04-16
  Filled 2014-04-16: qty 3

## 2014-04-16 MED ORDER — OXYCODONE-ACETAMINOPHEN 5-325 MG PO TABS
2.0000 | ORAL_TABLET | ORAL | Status: DC | PRN
  Administered 2014-04-16 – 2014-04-17 (×7): 2 via ORAL
  Filled 2014-04-16 (×8): qty 2

## 2014-04-16 MED ORDER — GLYCOPYRROLATE 0.2 MG/ML IJ SOLN
INTRAMUSCULAR | Status: DC | PRN
  Administered 2014-04-16: 0.4 mg via INTRAVENOUS

## 2014-04-16 MED ORDER — SUCCINYLCHOLINE CHLORIDE 20 MG/ML IJ SOLN
INTRAMUSCULAR | Status: DC | PRN
  Administered 2014-04-16: 100 mg via INTRAVENOUS

## 2014-04-16 MED ORDER — ZOLPIDEM TARTRATE 5 MG PO TABS
5.0000 mg | ORAL_TABLET | Freq: Every evening | ORAL | Status: DC | PRN
  Administered 2014-04-16 – 2014-04-17 (×2): 5 mg via ORAL
  Filled 2014-04-16 (×2): qty 1

## 2014-04-16 MED ORDER — SIMVASTATIN 5 MG PO TABS
5.0000 mg | ORAL_TABLET | Freq: Every day | ORAL | Status: DC
  Administered 2014-04-16 – 2014-04-17 (×2): 5 mg via ORAL
  Filled 2014-04-16 (×3): qty 1

## 2014-04-16 MED ORDER — HYDROMORPHONE HCL 2 MG/ML IJ SOLN
INTRAMUSCULAR | Status: AC
  Filled 2014-04-16: qty 1

## 2014-04-16 MED ORDER — AMLODIPINE BESYLATE 10 MG PO TABS
10.0000 mg | ORAL_TABLET | Freq: Every morning | ORAL | Status: DC
  Administered 2014-04-17 – 2014-04-18 (×2): 10 mg via ORAL
  Filled 2014-04-16 (×2): qty 1

## 2014-04-16 MED ORDER — LIDOCAINE HCL (CARDIAC) 20 MG/ML IV SOLN
INTRAVENOUS | Status: AC
  Filled 2014-04-16: qty 5

## 2014-04-16 MED ORDER — CLINDAMYCIN PHOSPHATE 900 MG/50ML IV SOLN
900.0000 mg | INTRAVENOUS | Status: AC
  Administered 2014-04-16: 900 mg via INTRAVENOUS

## 2014-04-16 MED ORDER — POLYVINYL ALCOHOL 1.4 % OP SOLN
1.0000 [drp] | Freq: Two times a day (BID) | OPHTHALMIC | Status: DC
  Administered 2014-04-17 (×2): 1 [drp] via OPHTHALMIC
  Filled 2014-04-16: qty 15

## 2014-04-16 MED ORDER — DEXAMETHASONE SODIUM PHOSPHATE 10 MG/ML IJ SOLN
INTRAMUSCULAR | Status: AC
  Filled 2014-04-16: qty 1

## 2014-04-16 MED ORDER — THROMBIN 5000 UNITS EX SOLR
CUTANEOUS | Status: AC
  Filled 2014-04-16: qty 5000

## 2014-04-16 MED ORDER — NEOSTIGMINE METHYLSULFATE 10 MG/10ML IV SOLN
INTRAVENOUS | Status: DC | PRN
  Administered 2014-04-16: 3 mg via INTRAVENOUS

## 2014-04-16 MED ORDER — HYDROMORPHONE HCL 1 MG/ML IJ SOLN
0.2500 mg | INTRAMUSCULAR | Status: DC | PRN
  Administered 2014-04-16 (×4): 0.5 mg via INTRAVENOUS

## 2014-04-16 MED ORDER — ALUM & MAG HYDROXIDE-SIMETH 200-200-20 MG/5ML PO SUSP: 30.0000 mL | ORAL | Status: DC | PRN

## 2014-04-16 MED ORDER — SCOPOLAMINE 1 MG/3DAYS TD PT72
MEDICATED_PATCH | TRANSDERMAL | Status: DC | PRN
  Administered 2014-04-16: 1 via TRANSDERMAL

## 2014-04-16 MED ORDER — PROPOFOL 10 MG/ML IV BOLUS
INTRAVENOUS | Status: AC
  Filled 2014-04-16: qty 20

## 2014-04-16 MED ORDER — HYDRALAZINE HCL 20 MG/ML IJ SOLN
INTRAMUSCULAR | Status: AC
  Filled 2014-04-16: qty 1

## 2014-04-16 MED ORDER — SODIUM CHLORIDE 0.9 % IJ SOLN
INTRAMUSCULAR | Status: AC
  Filled 2014-04-16: qty 50

## 2014-04-16 MED ORDER — LITHIUM CARBONATE 300 MG PO CAPS
300.0000 mg | ORAL_CAPSULE | Freq: Every day | ORAL | Status: DC
  Administered 2014-04-16 – 2014-04-17 (×2): 300 mg via ORAL
  Filled 2014-04-16 (×3): qty 1

## 2014-04-16 MED ORDER — MONTELUKAST SODIUM 10 MG PO TABS
10.0000 mg | ORAL_TABLET | Freq: Every day | ORAL | Status: DC
  Administered 2014-04-16 – 2014-04-17 (×2): 10 mg via ORAL
  Filled 2014-04-16 (×3): qty 1

## 2014-04-16 MED ORDER — HYDROMORPHONE HCL 1 MG/ML IJ SOLN
INTRAMUSCULAR | Status: DC | PRN
  Administered 2014-04-16 (×5): 0.5 mg via INTRAVENOUS
  Administered 2014-04-16: 1 mg via INTRAVENOUS
  Administered 2014-04-16: .5 mg via INTRAVENOUS

## 2014-04-16 MED ORDER — METHOCARBAMOL 1000 MG/10ML IJ SOLN
500.0000 mg | Freq: Four times a day (QID) | INTRAVENOUS | Status: DC | PRN
  Administered 2014-04-16: 500 mg via INTRAVENOUS
  Filled 2014-04-16: qty 5

## 2014-04-16 MED ORDER — POLYMYXIN B SULFATE 500000 UNITS IJ SOLR
INTRAMUSCULAR | Status: AC
  Filled 2014-04-16: qty 1

## 2014-04-16 MED ORDER — DEXAMETHASONE SODIUM PHOSPHATE 10 MG/ML IJ SOLN
INTRAMUSCULAR | Status: DC | PRN
  Administered 2014-04-16: 10 mg via INTRAVENOUS

## 2014-04-16 MED ORDER — ACETAMINOPHEN 10 MG/ML IV SOLN
1000.0000 mg | Freq: Once | INTRAVENOUS | Status: AC
  Administered 2014-04-16: 1000 mg via INTRAVENOUS
  Filled 2014-04-16: qty 100

## 2014-04-16 MED ORDER — PHENOL 1.4 % MT LIQD: 1.0000 | OROMUCOSAL | Status: DC | PRN

## 2014-04-16 MED ORDER — LISINOPRIL 20 MG PO TABS
20.0000 mg | ORAL_TABLET | Freq: Every morning | ORAL | Status: DC
  Administered 2014-04-17 – 2014-04-18 (×2): 20 mg via ORAL
  Filled 2014-04-16 (×2): qty 1

## 2014-04-16 MED ORDER — FENTANYL CITRATE 0.05 MG/ML IJ SOLN
INTRAMUSCULAR | Status: AC
  Filled 2014-04-16: qty 2

## 2014-04-16 MED ORDER — LACTATED RINGERS IV SOLN
INTRAVENOUS | Status: DC
  Administered 2014-04-16: 1000 mL via INTRAVENOUS
  Administered 2014-04-16 (×2): via INTRAVENOUS

## 2014-04-16 MED ORDER — MIDAZOLAM HCL 2 MG/2ML IJ SOLN
INTRAMUSCULAR | Status: AC
  Filled 2014-04-16: qty 2

## 2014-04-16 MED ORDER — SODIUM CHLORIDE 0.9 % IR SOLN
Status: DC | PRN
  Administered 2014-04-16: 12:00:00

## 2014-04-16 MED ORDER — ONDANSETRON HCL 4 MG/2ML IJ SOLN
INTRAMUSCULAR | Status: DC | PRN
  Administered 2014-04-16: 4 mg via INTRAVENOUS

## 2014-04-16 MED ORDER — ACETAMINOPHEN 325 MG PO TABS: 650.0000 mg | ORAL_TABLET | Freq: Four times a day (QID) | ORAL | Status: DC | PRN

## 2014-04-16 MED ORDER — HYDROMORPHONE HCL 1 MG/ML IJ SOLN
1.0000 mg | INTRAMUSCULAR | Status: DC | PRN
Start: 2014-04-16 — End: 2014-04-18
  Administered 2014-04-16 – 2014-04-18 (×6): 1 mg via INTRAVENOUS
  Filled 2014-04-16 (×6): qty 1

## 2014-04-16 MED ORDER — FUROSEMIDE 20 MG PO TABS
20.0000 mg | ORAL_TABLET | Freq: Every morning | ORAL | Status: DC
  Administered 2014-04-17: 20 mg via ORAL
  Filled 2014-04-16 (×2): qty 1

## 2014-04-16 MED ORDER — SODIUM CHLORIDE 0.9 % IJ SOLN
INTRAMUSCULAR | Status: AC
  Filled 2014-04-16: qty 10

## 2014-04-16 MED ORDER — ROCURONIUM BROMIDE 100 MG/10ML IV SOLN
INTRAVENOUS | Status: DC | PRN
  Administered 2014-04-16: 30 mg via INTRAVENOUS

## 2014-04-16 MED ORDER — BISACODYL 5 MG PO TBEC: 5.0000 mg | DELAYED_RELEASE_TABLET | Freq: Every day | ORAL | Status: DC | PRN

## 2014-04-16 MED ORDER — RIVAROXABAN 10 MG PO TABS
10.0000 mg | ORAL_TABLET | Freq: Every day | ORAL | Status: DC
  Administered 2014-04-17 – 2014-04-18 (×2): 10 mg via ORAL
  Filled 2014-04-16 (×3): qty 1

## 2014-04-16 MED ORDER — FENTANYL CITRATE 0.05 MG/ML IJ SOLN: 25.0000 ug | INTRAMUSCULAR | Status: DC | PRN

## 2014-04-16 MED ORDER — SUFENTANIL CITRATE 50 MCG/ML IV SOLN
INTRAVENOUS | Status: DC | PRN
  Administered 2014-04-16 (×2): 5 ug via INTRAVENOUS
  Administered 2014-04-16: 10 ug via INTRAVENOUS
  Administered 2014-04-16: 5 ug via INTRAVENOUS
  Administered 2014-04-16: 10 ug via INTRAVENOUS
  Administered 2014-04-16 (×3): 5 ug via INTRAVENOUS

## 2014-04-16 MED ORDER — HYDRALAZINE HCL 20 MG/ML IJ SOLN
INTRAMUSCULAR | Status: DC | PRN
  Administered 2014-04-16 (×3): 2.5 mg via INTRAVENOUS

## 2014-04-16 MED ORDER — METOCLOPRAMIDE HCL 5 MG/ML IJ SOLN
INTRAMUSCULAR | Status: DC | PRN
  Administered 2014-04-16: 10 mg via INTRAVENOUS

## 2014-04-16 MED ORDER — LACTATED RINGERS IV SOLN
INTRAVENOUS | Status: DC
  Administered 2014-04-16 – 2014-04-17 (×2): via INTRAVENOUS

## 2014-04-16 MED ORDER — SERTRALINE HCL 100 MG PO TABS
100.0000 mg | ORAL_TABLET | Freq: Two times a day (BID) | ORAL | Status: DC
  Administered 2014-04-16 – 2014-04-18 (×4): 100 mg via ORAL
  Filled 2014-04-16 (×5): qty 1

## 2014-04-16 MED ORDER — METHOCARBAMOL 500 MG PO TABS
500.0000 mg | ORAL_TABLET | Freq: Four times a day (QID) | ORAL | Status: DC | PRN
  Administered 2014-04-17 – 2014-04-18 (×4): 500 mg via ORAL
  Filled 2014-04-16 (×4): qty 1

## 2014-04-16 MED ORDER — MIDAZOLAM HCL 5 MG/5ML IJ SOLN
INTRAMUSCULAR | Status: DC | PRN
  Administered 2014-04-16: 2 mg via INTRAVENOUS

## 2014-04-16 MED ORDER — PROMETHAZINE HCL 25 MG/ML IJ SOLN: 6.2500 mg | INTRAMUSCULAR | Status: DC | PRN

## 2014-04-16 MED ORDER — CLINDAMYCIN PHOSPHATE 600 MG/50ML IV SOLN
600.0000 mg | Freq: Four times a day (QID) | INTRAVENOUS | Status: AC
  Administered 2014-04-16 (×2): 600 mg via INTRAVENOUS
  Filled 2014-04-16 (×2): qty 50

## 2014-04-16 MED ORDER — ALBUTEROL SULFATE (2.5 MG/3ML) 0.083% IN NEBU
INHALATION_SOLUTION | RESPIRATORY_TRACT | Status: AC
Start: 2014-04-16 — End: 2014-04-17
  Filled 2014-04-16: qty 3

## 2014-04-16 MED ORDER — ALBUTEROL SULFATE (2.5 MG/3ML) 0.083% IN NEBU
2.5000 mg | INHALATION_SOLUTION | Freq: Four times a day (QID) | RESPIRATORY_TRACT | Status: DC | PRN
  Administered 2014-04-16: 2.5 mg via RESPIRATORY_TRACT

## 2014-04-16 MED ORDER — ALPRAZOLAM 0.5 MG PO TABS
0.5000 mg | ORAL_TABLET | Freq: Two times a day (BID) | ORAL | Status: DC
  Administered 2014-04-16 – 2014-04-18 (×4): 0.5 mg via ORAL
  Filled 2014-04-16 (×4): qty 1

## 2014-04-16 MED ORDER — SUFENTANIL CITRATE 50 MCG/ML IV SOLN
INTRAVENOUS | Status: AC
  Filled 2014-04-16: qty 1

## 2014-04-16 MED ORDER — PROPOFOL 10 MG/ML IV BOLUS
INTRAVENOUS | Status: DC | PRN
  Administered 2014-04-16: 180 mg via INTRAVENOUS

## 2014-04-16 MED ORDER — MENTHOL 3 MG MT LOZG: 1.0000 | LOZENGE | OROMUCOSAL | Status: DC | PRN

## 2014-04-16 MED ORDER — HYDROMORPHONE HCL 1 MG/ML IJ SOLN
INTRAMUSCULAR | Status: AC
  Filled 2014-04-16: qty 1

## 2014-04-16 MED ORDER — ONDANSETRON HCL 4 MG/2ML IJ SOLN: 4.0000 mg | Freq: Four times a day (QID) | INTRAMUSCULAR | Status: DC | PRN

## 2014-04-16 MED ORDER — CELECOXIB 200 MG PO CAPS
200.0000 mg | ORAL_CAPSULE | Freq: Two times a day (BID) | ORAL | Status: DC
  Administered 2014-04-16 – 2014-04-18 (×4): 200 mg via ORAL
  Filled 2014-04-16 (×5): qty 1

## 2014-04-16 MED ORDER — POLYETHYLENE GLYCOL 3350 17 G PO PACK: 17.0000 g | PACK | Freq: Every day | ORAL | Status: DC | PRN

## 2014-04-16 MED ORDER — TRANEXAMIC ACID 100 MG/ML IV SOLN
1000.0000 mg | INTRAVENOUS | Status: AC
  Administered 2014-04-16: 1000 mg via INTRAVENOUS
  Filled 2014-04-16: qty 10

## 2014-04-16 MED ORDER — ALBUTEROL SULFATE (2.5 MG/3ML) 0.083% IN NEBU: 3.0000 mL | INHALATION_SOLUTION | Freq: Four times a day (QID) | RESPIRATORY_TRACT | Status: DC | PRN

## 2014-04-16 MED ORDER — SODIUM CHLORIDE 0.9 % IJ SOLN
INTRAMUSCULAR | Status: DC | PRN
  Administered 2014-04-16: 20 mL via INTRAVENOUS

## 2014-04-16 MED ORDER — BUPIVACAINE LIPOSOME 1.3 % IJ SUSP
20.0000 mL | Freq: Once | INTRAMUSCULAR | Status: AC
  Administered 2014-04-16: 20 mL
  Filled 2014-04-16: qty 20

## 2014-04-16 MED ORDER — CLINDAMYCIN PHOSPHATE 900 MG/50ML IV SOLN
INTRAVENOUS | Status: AC
  Filled 2014-04-16: qty 50

## 2014-04-16 SURGICAL SUPPLY — 72 items
ADH SKN CLS APL DERMABOND .7 (GAUZE/BANDAGES/DRESSINGS) ×1
BAG SPEC THK2 15X12 ZIP CLS (MISCELLANEOUS)
BAG ZIPLOCK 12X15 (MISCELLANEOUS) IMPLANT
BANDAGE ELASTIC 4 VELCRO ST LF (GAUZE/BANDAGES/DRESSINGS) ×2 IMPLANT
BANDAGE ELASTIC 6 VELCRO ST LF (GAUZE/BANDAGES/DRESSINGS) ×2 IMPLANT
BANDAGE ESMARK 6X9 LF (GAUZE/BANDAGES/DRESSINGS) ×1 IMPLANT
BLADE SAG 18X100X1.27 (BLADE) ×2 IMPLANT
BLADE SAW SGTL 11.0X1.19X90.0M (BLADE) ×2 IMPLANT
BNDG CMPR 9X6 STRL LF SNTH (GAUZE/BANDAGES/DRESSINGS) ×1
BNDG ESMARK 6X9 LF (GAUZE/BANDAGES/DRESSINGS) ×2
BONE CEMENT GENTAMICIN (Cement) ×4 IMPLANT
CAPT RP KNEE ×1 IMPLANT
CEMENT BONE GENTAMICIN 40 (Cement) ×2 IMPLANT
CUFF TOURN SGL QUICK 34 (TOURNIQUET CUFF) ×2
CUFF TRNQT CYL 34X4X40X1 (TOURNIQUET CUFF) ×1 IMPLANT
DERMABOND ADVANCED (GAUZE/BANDAGES/DRESSINGS) ×1
DERMABOND ADVANCED .7 DNX12 (GAUZE/BANDAGES/DRESSINGS) ×1 IMPLANT
DRAPE EXTREMITY TIBURON (DRAPES) ×2 IMPLANT
DRAPE INCISE IOBAN 66X45 STRL (DRAPES) ×1 IMPLANT
DRAPE POUCH INSTRU U-SHP 10X18 (DRAPES) ×2 IMPLANT
DRAPE U-SHAPE 47X51 STRL (DRAPES) ×2 IMPLANT
DRSG ADAPTIC 3X8 NADH LF (GAUZE/BANDAGES/DRESSINGS) ×1 IMPLANT
DRSG AQUACEL AG ADV 3.5X10 (GAUZE/BANDAGES/DRESSINGS) ×2 IMPLANT
DRSG KUZMA FLUFF (GAUZE/BANDAGES/DRESSINGS) ×1 IMPLANT
DRSG PAD ABDOMINAL 8X10 ST (GAUZE/BANDAGES/DRESSINGS) IMPLANT
DRSG TEGADERM 4X4.75 (GAUZE/BANDAGES/DRESSINGS) ×2 IMPLANT
DURAPREP 26ML APPLICATOR (WOUND CARE) ×2 IMPLANT
ELECT REM PT RETURN 9FT ADLT (ELECTROSURGICAL) ×2
ELECTRODE REM PT RTRN 9FT ADLT (ELECTROSURGICAL) ×1 IMPLANT
EVACUATOR 1/8 PVC DRAIN (DRAIN) ×2 IMPLANT
FACESHIELD WRAPAROUND (MASK) ×8 IMPLANT
FACESHIELD WRAPAROUND OR TEAM (MASK) ×5 IMPLANT
GAUZE SPONGE 2X2 8PLY STRL LF (GAUZE/BANDAGES/DRESSINGS) ×1 IMPLANT
GLOVE BIOGEL PI IND STRL 6.5 (GLOVE) ×1 IMPLANT
GLOVE BIOGEL PI IND STRL 8 (GLOVE) ×1 IMPLANT
GLOVE BIOGEL PI INDICATOR 6.5 (GLOVE) ×1
GLOVE BIOGEL PI INDICATOR 8 (GLOVE) ×1
GLOVE ECLIPSE 8.0 STRL XLNG CF (GLOVE) ×4 IMPLANT
GLOVE SURG SS PI 6.5 STRL IVOR (GLOVE) ×2 IMPLANT
GOWN STRL REUS W/TWL LRG LVL3 (GOWN DISPOSABLE) ×2 IMPLANT
GOWN STRL REUS W/TWL XL LVL3 (GOWN DISPOSABLE) ×2 IMPLANT
HANDPIECE INTERPULSE COAX TIP (DISPOSABLE) ×2
IMMOBILIZER KNEE 20 (SOFTGOODS) ×3 IMPLANT
IMMOBILIZER KNEE 20 THIGH 36 (SOFTGOODS) ×1 IMPLANT
KIT BASIN OR (CUSTOM PROCEDURE TRAY) ×2 IMPLANT
MANIFOLD NEPTUNE II (INSTRUMENTS) ×2 IMPLANT
NS IRRIG 1000ML POUR BTL (IV SOLUTION) IMPLANT
PACK TOTAL JOINT (CUSTOM PROCEDURE TRAY) ×2 IMPLANT
PAD ABD 8X10 STRL (GAUZE/BANDAGES/DRESSINGS) ×1 IMPLANT
PADDING CAST COTTON 6X4 STRL (CAST SUPPLIES) IMPLANT
POSITIONER SURGICAL ARM (MISCELLANEOUS) ×2 IMPLANT
SET HNDPC FAN SPRY TIP SCT (DISPOSABLE) ×1 IMPLANT
SET PAD KNEE POSITIONER (MISCELLANEOUS) ×2 IMPLANT
SPONGE GAUZE 2X2 STER 10/PKG (GAUZE/BANDAGES/DRESSINGS) ×1
SPONGE LAP 18X18 X RAY DECT (DISPOSABLE) IMPLANT
SPONGE SURGIFOAM ABS GEL 100 (HEMOSTASIS) ×2 IMPLANT
STAPLER VISISTAT 35W (STAPLE) IMPLANT
SUCTION FRAZIER 12FR DISP (SUCTIONS) ×2 IMPLANT
SUT BONE WAX W31G (SUTURE) ×2 IMPLANT
SUT MNCRL AB 4-0 PS2 18 (SUTURE) ×2 IMPLANT
SUT VIC AB 1 CT1 27 (SUTURE) ×6
SUT VIC AB 1 CT1 27XBRD ANTBC (SUTURE) ×2 IMPLANT
SUT VIC AB 2-0 CT1 27 (SUTURE) ×8
SUT VIC AB 2-0 CT1 TAPERPNT 27 (SUTURE) ×3 IMPLANT
SUT VLOC 180 0 24IN GS25 (SUTURE) ×2 IMPLANT
SYR 20CC LL (SYRINGE) ×2 IMPLANT
TOWEL OR 17X26 10 PK STRL BLUE (TOWEL DISPOSABLE) ×2 IMPLANT
TOWEL OR NON WOVEN STRL DISP B (DISPOSABLE) IMPLANT
TOWER CARTRIDGE SMART MIX (DISPOSABLE) ×2 IMPLANT
TRAY FOLEY CATH 14FRSI W/METER (CATHETERS) ×2 IMPLANT
WATER STERILE IRR 1500ML POUR (IV SOLUTION) ×2 IMPLANT
WRAP KNEE MAXI GEL POST OP (GAUZE/BANDAGES/DRESSINGS) ×2 IMPLANT

## 2014-04-16 NOTE — Progress Notes (Signed)
Dr. Darrelyn Hillock and Dimitri Ped PA notified of needing consent order.  Amber states she will re-enter order.

## 2014-04-16 NOTE — Anesthesia Postprocedure Evaluation (Signed)
  Anesthesia Post-op Note  Patient: [name]  Procedure(s) Performed: Procedure(s) (LRB): LEFT TOTAL KNEE ARTHROPLASTY (Left)  Patient Location: PACU  Anesthesia Type: General  Level of Consciousness: awake and alert   Airway and Oxygen Therapy: Patient Spontanous Breathing  Post-op Pain: mild  Post-op Assessment: Post-op Vital signs reviewed, Patient's Cardiovascular Status Stable, Respiratory Function Stable, Patent Airway and No signs of Nausea or vomiting  Last Vitals:  Filed Vitals:   04/16/14 1758  BP: 139/82  Pulse: 102  Temp: 36.3 C  Resp: 16    Post-op Vital Signs: stable   Complications: No apparent anesthesia complications

## 2014-04-16 NOTE — Interval H&P Note (Signed)
History and Physical Interval Note:  04/16/2014 9:48 AM  [name]  has presented today for surgery, with the diagnosis of left knee osteoarthritis  The various methods of treatment have been discussed with the patient and family. After consideration of risks, benefits and other options for treatment, the patient has consented to  Procedure(s): LEFT TOTAL KNEE ARTHROPLASTY (Left) as a surgical intervention .  The patient's history has been reviewed, patient examined, no change in status, stable for surgery.  I have reviewed the patient's chart and labs.  Questions were answered to the patient's satisfaction.     Amiliana Foutz A

## 2014-04-16 NOTE — Evaluation (Signed)
Physical Therapy Evaluation Patient Details Name: [name] MRN: [id number] DOB: August 31, 1960 Today's Date: 04/16/2014   History of Present Illness  L TKR  Clinical Impression  Pt s/p L TKR presents with decreased L LE strength/ROM and post op pain limiting functional mobility.  Pt should progress to d/c home with family assist and HHPT follow up.    Follow Up Recommendations Home health PT    Equipment Recommendations  None recommended by PT    Recommendations for Other Services OT consult     Precautions / Restrictions Precautions Precautions: Knee;Fall Required Braces or Orthoses: Knee Immobilizer - Left Knee Immobilizer - Left: Discontinue once straight leg raise with < 10 degree lag Restrictions Weight Bearing Restrictions: No Other Position/Activity Restrictions: WBAT      Mobility  Bed Mobility Overal bed mobility: Needs Assistance Bed Mobility: Supine to Sit     Supine to sit: +2 for physical assistance;Min assist;Mod assist     General bed mobility comments: cues for sequence and use of R LE to self assist  Transfers Overall transfer level: Needs assistance Equipment used: Rolling walker (2 wheeled) Transfers: Sit to/from Stand Sit to Stand: Min assist;+2 physical assistance;+2 safety/equipment;From elevated surface         General transfer comment: cues for LE management and use of UEs to self assist  Ambulation/Gait Ambulation/Gait assistance: Min assist;+2 physical assistance;+2 safety/equipment Ambulation Distance (Feet): 7 Feet Assistive device: Rolling walker (2 wheeled) Gait Pattern/deviations: Step-to pattern;Decreased step length - right;Decreased step length - left;Shuffle;Trunk flexed Gait velocity: decr   General Gait Details: cues for sequence, posture and position from [employer]            Wheelchair Mobility    Modified Rankin (Stroke Patients Only)       Balance                                              Pertinent Vitals/Pain Pain Assessment: 0-10 Pain Score: 5  Pain Location: L knee Pain Descriptors / Indicators: Aching;Sore Pain Intervention(s): Limited activity within patient's tolerance;Monitored during session;Premedicated before session;Ice applied    Home Living Family/patient expects to be discharged to:: Private residence Living Arrangements: Spouse/significant other Available Help at Discharge: Family Type of Home: House Home Access: Stairs to enter Entrance Stairs-Rails: None Entrance Stairs-Number of Steps: 2 Home Layout: One level Home Equipment: Environmental consultant - 2 wheels;Adaptive equipment      Prior Function Level of Independence: Independent               Hand Dominance   Dominant Hand: Right    Extremity/Trunk Assessment   Upper Extremity Assessment: Overall WFL for tasks assessed           Lower Extremity Assessment: LLE deficits/detail      Cervical / Trunk Assessment: Normal  Communication   Communication: No difficulties  Cognition Arousal/Alertness: Awake/alert Behavior During Therapy: WFL for tasks assessed/performed Overall Cognitive Status: Within Functional Limits for tasks assessed                      General Comments      Exercises Total Joint Exercises Ankle Circles/Pumps: AROM;Both;10 reps;Supine      Assessment/Plan    PT Assessment Patient needs continued PT services  PT Diagnosis Difficulty walking   PT Problem List Decreased strength;Decreased range of motion;Decreased  activity tolerance;Decreased mobility;Decreased knowledge of use of DME;Obesity;Pain  PT Treatment Interventions DME instruction;Gait training;Stair training;Functional mobility training;Therapeutic activities;Therapeutic exercise;Patient/family education   PT Goals (Current goals can be found in the Care Plan section) Acute Rehab PT Goals Patient Stated Goal: Resume previous lifestyle with decreased pain PT Goal Formulation:  With patient Time For Goal Achievement: 04/23/14 Potential to Achieve Goals: Good    Frequency 7X/week   Barriers to discharge        Co-evaluation               End of Session Equipment Utilized During Treatment: Gait belt;Left knee immobilizer Activity Tolerance: Patient tolerated treatment well Patient left: in chair;with call bell/phone within reach;with family/visitor present Nurse Communication: Mobility status         Time: 1610-9604 PT Time Calculation (min): 23 min   Charges:   PT Evaluation $Initial PT Evaluation Tier I: 1 Procedure PT Treatments $Gait Training: 8-22 mins   PT G Codes:          Lurlene Ronda 04/16/2014, 5:35 PM

## 2014-04-16 NOTE — Brief Op Note (Signed)
04/16/2014  12:42 PM  PATIENT:  [name]  53 y.o. female  PRE-OPERATIVE DIAGNOSIS:  left knee osteoarthritis with Contracture. Morbid Obesity  POST-OPERATIVE DIAGNOSIS:  Same as Pre-Op  PROCEDURE:  Procedure(s): LEFT TOTAL KNEE ARTHROPLASTY (Left) and release of Contractures.  SURGEON:  Surgeon(s) and Role:    * Jacki Cones, MD - Primary  PHYSICIAN ASSISTANT: Dimitri Ped PA  ASSISTANTS:Amber [place] PA  ANESTHESIA:   general  EBL:  Total I/O In: 1000 [I.V.:1000] Out: 250 [Urine:250]  BLOOD ADMINISTERED:none  DRAINS: (one) Hemovact drain(s) in the Left Knee with  Suction Open   LOCAL MEDICATIONS USED:  BUPIVICAINE 20cc mixed with 20cc of Normal Saline  SPECIMEN:  No Specimen  DISPOSITION OF SPECIMEN:  N/A  COUNTS:  YES  TOURNIQUET:  * Missing tourniquet times found for documented tourniquets in log:  [id number] *  DICTATION: .Other Dictation: Dictation Number [id number]  PLAN OF CARE: Admit to inpatient   PATIENT DISPOSITION:  Stable in OR   Delay start of Pharmacological VTE agent (>24hrs) due to surgical blood loss or risk of bleeding: yes

## 2014-04-16 NOTE — Transfer of Care (Signed)
Immediate Anesthesia Transfer of Care Note  Patient: [name]  Procedure(s) Performed: Procedure(s): LEFT TOTAL KNEE ARTHROPLASTY (Left)  Patient Location: PACU  Anesthesia Type:General  Level of Consciousness: awake, alert , oriented and patient cooperative  Airway & Oxygen Therapy: Patient Spontanous Breathing and Patient connected to face mask oxygen  Post-op Assessment: Report given to PACU RN, Post -op Vital signs reviewed and stable and Patient moving all extremities  Post vital signs: Reviewed and stable  Complications: No apparent anesthesia complications

## 2014-04-16 NOTE — Anesthesia Preprocedure Evaluation (Addendum)
Anesthesia Evaluation  Patient identified by MRN, date of birth, ID band Patient awake  General Assessment Comment: Chronic back pain     .  Anxiety     .  Depression     .  Hypertension     .  High cholesterol     .  GERD (gastroesophageal reflux disease)     .  Colon polyps     .  Asthma     .  Trigger finger of both hands  2007       thumb   .  PONV (postoperative nausea and vomiting)     .  Migraines         hx of    .  Arthritis     .  Anemia         hx of with pregnancy    .  Sleep apnea         does not use CPAP machine      Reviewed: Allergy & Precautions, H&P , NPO status , Patient's Chart, lab work & pertinent test results  History of Anesthesia Complications (+) PONV and history of anesthetic complications  Airway Mallampati: II TM Distance: >3 FB Neck ROM: Full    Dental no notable dental hx.    Pulmonary shortness of breath, asthma , sleep apnea ,  breath sounds clear to auscultation  Pulmonary exam normal       Cardiovascular hypertension, Pt. on medications Rhythm:Regular Rate:Normal     Neuro/Psych  Headaches, PSYCHIATRIC DISORDERS Anxiety Depression    GI/Hepatic Neg liver ROS, GERD-  ,  Endo/Other  Morbid obesity  Renal/GU negative Renal ROS  negative genitourinary   Musculoskeletal  (+) Arthritis -,   Abdominal (+) + obese,   Peds negative pediatric ROS (+)  Hematology  (+) anemia ,   Anesthesia Other Findings   Reproductive/Obstetrics negative OB ROS                          Anesthesia Physical Anesthesia Plan  ASA: III  Anesthesia Plan: General   Post-op Pain Management:    Induction: Intravenous  Airway Management Planned: Oral ETT  Additional Equipment:   Intra-op Plan:   Post-operative Plan: Extubation in OR  Informed Consent: I have reviewed the patients History and Physical, chart, labs and discussed the procedure including the risks,  benefits and alternatives for the proposed anesthesia with the patient or authorized representative who has indicated his/her understanding and acceptance.   Dental advisory given  Plan Discussed with: CRNA  Anesthesia Plan Comments: (Discussed r/b general and spinal. Previous back surgery with rods. Chronic back pain. She prefers general.)       Anesthesia Quick Evaluation

## 2014-04-16 NOTE — Op Note (Signed)
NAME:  [name]           ACCOUNT NO.:  [id number]  MEDICAL RELATEIA, [id number]  LOCATION:  1611                         FACILITY:  [facility]  PHYSICIAN:  Georges Lynch. Dorothy Landgrebe, M.D.DATE OF BIRTH:  1961/05/24  DATE OF PROCEDURE:  04/16/2014 DATE OF DISCHARGE:                              OPERATIVE REPORT   SURGEON:  Georges Lynch. Darrelyn Hillock, M.D.  ASSISTANT:  Dimitri Ped, [place]  PREOPERATIVE DIAGNOSES: 1. Severe flexion contracture, left knee. 2. Severe degenerative arthritis with bone-on-bone, left knee. 3. Morbid obesity.  POSTOPERATIVE DIAGNOSES: 1. Severe flexion contracture, left knee. 2. Severe degenerative arthritis with bone-on-bone, left knee. 3. Morbid obesity.  OPERATION: 1. Left total knee arthroplasty. 2. Release of contractures, left knee.  Note, the sizes of the total     knee we utilized a DePuy cemented total knee, gentamicin was in the     cement.  The size of patella was a size 35 with 3 pegs.  The     femoral component was a size 3 left posterior cruciate sacrificing,     the tray was a size 2.5, the insert was a size 3, 15-mm in     thickness.  DESCRIPTION OF PROCEDURE:  Under general anesthesia, routine orthopedic prepping and draping of the left lower extremity was carried out. Appropriate time-out was carried out, also marked the appropriate left knee in the holding area.  She was given Cleocin 900 mg in the OR. Note, the leg was exsanguinated with Esmarch, tourniquet was elevated to 325 mmHg.  The leg was placed in [facility] knee holder.  At this time, the knee was flexed and anterior approach of the knee was carried out. Note, we had an extremely difficult time initially manipulating her knee because of her obesity, but we finally were able to open the knee joint well, did a good job of releases and we did a median parapatellar incision, reflected the patella.  Following that, we did medial and lateral meniscectomies and excised the  anterior-posterior cruciate ligaments.  The initial drill hole was made in the intercondylar notch and removed 14 mm thickness off the distal femur because of the contractures and also because I wanted to putting a larger thickness knee on her because of her body habitus.  At this point, we measured the femur to be a size 3 left.  We cut the femur for a size left with anterior, posterior and chamfering cuts in the usual fashion.  We then directed attention to the tibia and measured the tibia to be a size 2.5- mm tray.  We then went on and made our initial drill hole in the tibial canal and inserted the guide rod.  Following that, we irrigated out the tibial canal as we did the femoral canal initially after we used the guide rod there as well.  We then removed 6-mm thickness off the affected medial side of the tibia in usual fashion.  After this, we utilized the lamina spreader and removed the posterior spurs off the femur.  Following that, we then inserted our spacer devices and finally selected a 15-mm thickness.  We then continued to proceed on and prepared tibia.  We then made our keel cut  of the tibia in the usual fashion.  We then cut our notch cut of the femur.  Our trial components were all inserted obviously prior to that and we did a resurfacing procedure on the patella in the usual fashion as well.  We then removed all trial components, thoroughly water picked out the knee and cemented all three components and simultaneously gentamicin was used in the cement.  We then removed all loose pieces of cement, water picked out the knee, and then inserted some thrombin-soaked Gelfoam posteriorly. After the cement was hardened, we removed all the loose pieces of cement that is after we cemented the prosthesis in.  We then went back, checked again and felt that the 15-mm thickness was the best thickness for the tibial component.  So, we then removed the trial component of tibia, inserted  our permanent rotating platform, size 3 15-mm thickness, reduced the knee and had excellent function.  We did do good releases. We had good stability in medial lateral plane and good flexion extension.  We could not get a thicker prosthesis for rotating platform. The knee was reduced.  We inserted a Hemovac drain and closed the knee in layers in usual fashion.  We inserted the Hemovac drain as I mentioned.  The remaining part of the wound was closed in usual fashion.          ______________________________ Georges Lynch Darrelyn Hillock, M.D.     RAG/MEDQ  D:  04/16/2014  T:  04/16/2014  Job:  [id number]

## 2014-04-17 LAB — BASIC METABOLIC PANEL
Anion gap: 14 (ref 5–15)
BUN: 11 mg/dL (ref 6–23)
CO2: 24 mEq/L (ref 19–32)
Calcium: 9.4 mg/dL (ref 8.4–10.5)
Chloride: 98 mEq/L (ref 96–112)
Creatinine, Ser: 0.78 mg/dL (ref 0.50–1.10)
GFR calc Af Amer: >90 mL/min (ref >90)
GFR calc non Af Amer: >90 mL/min (ref >90)
Glucose, Bld: 135 mg/dL — ABNORMAL HIGH (ref 70–99)
Potassium: 4.3 mEq/L (ref 3.7–5.3)
Sodium: 136 mEq/L — ABNORMAL LOW (ref 137–147)

## 2014-04-17 LAB — CBC
HCT: 34.8 % — ABNORMAL LOW (ref 36.0–46.0)
Hemoglobin: 11.5 g/dL — ABNORMAL LOW (ref 12.0–15.0)
MCH: 29 pg (ref 26.0–34.0)
MCHC: 33 g/dL (ref 30.0–36.0)
MCV: 87.9 fL (ref 78.0–100.0)
Platelets: 253 10*3/uL (ref 150–400)
RBC: 3.96 MIL/uL (ref 3.87–5.11)
RDW: 14.4 % (ref 11.5–15.5)
WBC: 11.3 10*3/uL — ABNORMAL HIGH (ref 4.0–10.5)

## 2014-04-17 NOTE — Progress Notes (Signed)
Utilization review completed.  

## 2014-04-17 NOTE — Progress Notes (Signed)
   Subjective: 1 Day Post-Op Procedure(s) (LRB): LEFT TOTAL KNEE ARTHROPLASTY (Left) Patient reports pain as moderate.   Patient seen in rounds without Dr. Darrelyn Hillock. Patient is well, and has had no acute complaints or problems. She reports that she did not get much rest last night due to discomfort in her left knee. She denies SOB or chest pain. No issues overnight. She reports that she was able to get up with therapy last night.  Plan is to go Home after hospital stay.  Objective: Vital signs in last 24 hours: Temp:  [97.3 F (36.3 C)-98.9 F (37.2 C)] 98.6 F (37 C) (09/24 0612) Pulse Rate:  [89-127] 89 (09/24 0612) Resp:  [11-20] 17 (09/24 0612) BP: (103-161)/(51-82) 141/74 mmHg (09/24 0612) SpO2:  [93 %-98 %] 98 % (09/24 0612) Weight:  [132.9 kg (292 lb 15.9 oz)-132.904 kg (293 lb)] 132.9 kg (292 lb 15.9 oz) (09/23 1543)  Intake/Output from previous day:  Intake/Output Summary (Last 24 hours) at 04/17/14 0705 Last data filed at 04/17/14 0612  Gross per 24 hour  Intake   3935 ml  Output   2690 ml  Net   1245 ml     Labs:  Recent Labs  04/14/14 1500 04/17/14 0442  HGB 13.1 11.5*    Recent Labs  04/14/14 1500 04/17/14 0442  WBC 7.4 11.3*  RBC 4.47 3.96  HCT 38.9 34.8*  PLT 241 253    Recent Labs  04/14/14 1500 04/17/14 0442  NA 135* 136*  K 4.9 4.3  CL 97 98  CO2 26 24  BUN 11 11  CREATININE 0.74 0.78  GLUCOSE 100* 135*  CALCIUM 10.0 9.4    Recent Labs  04/14/14 1500  INR 0.98    EXAM General - Patient is Alert and Oriented Extremity - Neurologically intact Intact pulses distally Dorsiflexion/Plantar flexion intact Compartment soft Dressing - dressing C/D/I Motor Function - intact, moving foot and toes well on exam.  Hemovac pulled without difficulty.  Past Medical History  Diagnosis Date  . Anxiety   . Depression   . Hypertension   . High cholesterol   . GERD (gastroesophageal reflux disease)   . Colon polyps   . Asthma   .  PONV (postoperative nausea and vomiting)   . Arthritis   . Anemia     hx of with pregnancy   . Sleep apnea     has not use CPAP machine in 2 yrs/ DOES NOT KNOW IF SHE NEEDS TO USE C - PAP    Assessment/Plan: 1 Day Post-Op Procedure(s) (LRB): LEFT TOTAL KNEE ARTHROPLASTY (Left) Active Problems:   Total knee replacement status  Estimated body mass index is 50.27 kg/(m^2) as calculated from the following:   Height as of this encounter:  (1.626 m).   Weight as of this encounter: 132.9 kg (292 lb 15.9 oz). Advance diet Up with therapy D/C IV fluids when tolerating POs well  DVT Prophylaxis - Xarelto Weight-Bearing as tolerated  She is doing well. Appears motivated in regards to therapy. Will continue PT today. Possible DC home tomorrow depending on progress with PT today and pain management.   Dimitri Ped, PA-C Orthopaedic Surgery 04/17/2014, 7:05 AM

## 2014-04-17 NOTE — Progress Notes (Signed)
Physical Therapy Treatment Patient Details Name: [name] MRN: [id number] DOB: 09-10-1960 Today's Date: 2014-05-09    History of Present Illness L TKR    PT Comments    Reviewed don/doff KI.  Pt eager for d/c tomorrow  Follow Up Recommendations  Home health PT     Equipment Recommendations  None recommended by PT    Recommendations for Other Services OT consult     Precautions / Restrictions Precautions Precautions: Knee;Fall Required Braces or Orthoses: Knee Immobilizer - Left Knee Immobilizer - Left: Discontinue once straight leg raise with < 10 degree lag Restrictions Weight Bearing Restrictions: No Other Position/Activity Restrictions: WBAT    Mobility  Bed Mobility Overal bed mobility: Needs Assistance Bed Mobility: Supine to Sit     Supine to sit: Min assist     General bed mobility comments: assist for L LE off the bed.  Transfers Overall transfer level: Needs assistance Equipment used: Rolling walker (2 wheeled) Transfers: Sit to/from Stand Sit to Stand: Min assist         General transfer comment: verbal cues hand placement and LE management.  Ambulation/Gait Ambulation/Gait assistance: Min assist;Min guard Ambulation Distance (Feet): 123 Feet Assistive device: Rolling walker (2 wheeled) Gait Pattern/deviations: Step-to pattern;Decreased step length - right;Decreased step length - left;Shuffle Gait velocity: decr   General Gait Details: cues for sequence, posture and position from [employer]            Wheelchair Mobility    Modified Rankin (Stroke Patients Only)       Balance                                    Cognition Arousal/Alertness: Awake/alert Behavior During Therapy: WFL for tasks assessed/performed Overall Cognitive Status: Within Functional Limits for tasks assessed                      Exercises      General Comments        Pertinent Vitals/Pain Pain Assessment:  0-10 Pain Score: 6  Pain Location: L knee Pain Intervention(s): Limited activity within patient's tolerance;Monitored during session;Premedicated before session    Home Living                      Prior Function            PT Goals (current goals can now be found in the care plan section) Acute Rehab PT Goals Patient Stated Goal: return to independence. PT Goal Formulation: With patient Time For Goal Achievement: 04/23/14 Potential to Achieve Goals: Good Progress towards PT goals: Progressing toward goals    Frequency  7X/week    PT Plan Current plan remains appropriate    Co-evaluation             End of Session Equipment Utilized During Treatment: Left knee immobilizer Activity Tolerance: Patient tolerated treatment well Patient left: Other (comment) (bathroom)     Time: 8295-6213 PT Time Calculation (min): 20 min  Charges:  $Gait Training: 8-22 mins                    G Codes:      [name] 05-09-2014, 5:08 PM

## 2014-04-17 NOTE — Discharge Instructions (Addendum)
Information on my medicine - XARELTO (Rivaroxaban)  This medication education was reviewed with me or my healthcare representative as part of my discharge preparation.  The pharmacist that spoke with me during my hospital stay was:  Berkley Harvey, [facility]  Why was Xarelto prescribed for you? Xarelto was prescribed for you to reduce the risk of blood clots forming after orthopedic surgery. The medical term for these abnormal blood clots is venous thromboembolism (VTE).  What do you need to know about xarelto ? Take your Xarelto ONCE DAILY at the same time every day. You may take it either with or without food.  If you have difficulty swallowing the tablet whole, you may crush it and mix in applesauce just prior to taking your dose.  Take Xarelto exactly as prescribed by your doctor and DO NOT stop taking Xarelto without talking to the doctor who prescribed the medication.  Stopping without other VTE prevention medication to take the place of Xarelto may increase your risk of developing a clot.  After discharge, you should have regular check-up appointments with your healthcare provider that is prescribing your Xarelto.    What do you do if you miss a dose? If you miss a dose, take it as soon as you remember on the same day then continue your regularly scheduled once daily regimen the next day. Do not take two doses of Xarelto on the same day.   Important Safety Information A possible side effect of Xarelto is bleeding. You should call your healthcare provider right away if you experience any of the following:   Bleeding from an injury or your nose that does not stop.   Unusual colored urine (red or dark brown) or unusual colored stools (red or black).   Unusual bruising for unknown reasons.   A serious fall or if you hit your head (even if there is no bleeding).  Some medicines may interact with Xarelto and might increase your risk of bleeding while on Xarelto. To help  avoid this, consult your healthcare provider or pharmacist prior to using any new prescription or non-prescription medications, including herbals, vitamins, non-steroidal anti-inflammatory drugs (NSAIDs) and supplements.  This website has more information on Xarelto: [email or web address].com.br.    Walk with your walker. Weight bearing as tolerated Home Health Agency will follow you at home for your therapy Change your dressing daily. Shower only, no tub bath. Call if any temperatures greater than 101 or any wound complications: [phone] during the day and ask for Dr. Jeannetta Ellis nurse, [facility]. You may take Tylenol with the oxycodone to supplement for better pain management but do not take more than 3g per day.

## 2014-04-17 NOTE — Progress Notes (Signed)
Physical Therapy Treatment Patient Details Name: Mazel Villela MRN: [id number] DOB: 1960/10/07 Today's Date: 04/17/2014    History of Present Illness L TKR    PT Comments    Motivated and progressing well  Follow Up Recommendations  Home health PT     Equipment Recommendations  None recommended by PT    Recommendations for Other Services OT consult     Precautions / Restrictions Precautions Precautions: Knee;Fall Required Braces or Orthoses: Knee Immobilizer - Left Knee Immobilizer - Left: Discontinue once straight leg raise with < 10 degree lag Restrictions Weight Bearing Restrictions: No Other Position/Activity Restrictions: WBAT    Mobility  Bed Mobility Overal bed mobility: Needs Assistance Bed Mobility: Supine to Sit     Supine to sit: Min assist     General bed mobility comments: cues for sequence and use of R LE to self assist  Transfers Overall transfer level: Needs assistance Equipment used: Rolling walker (2 wheeled) Transfers: Sit to/from Stand Sit to Stand: Min assist;From elevated surface         General transfer comment: cues for LE management and use of UEs to self assist  Ambulation/Gait Ambulation/Gait assistance: Min assist Ambulation Distance (Feet): 74 Feet Assistive device: Rolling walker (2 wheeled) Gait Pattern/deviations: Step-to pattern;Decreased step length - right;Decreased step length - left;Shuffle;Trunk flexed Gait velocity: decr   General Gait Details: cues for sequence, posture and position from [employer]            Wheelchair Mobility    Modified Rankin (Stroke Patients Only)       Balance                                    Cognition Arousal/Alertness: Awake/alert Behavior During Therapy: WFL for tasks assessed/performed Overall Cognitive Status: Within Functional Limits for tasks assessed                      Exercises Total Joint Exercises Ankle Circles/Pumps:  AROM;Both;10 reps;Supine Quad Sets: AROM;Both;10 reps;Supine Heel Slides: AAROM;Left;Supine;10 reps Straight Leg Raises: AAROM;Left;10 reps;Supine Goniometric ROM: AAROM at L knee -10 - 40    General Comments        Pertinent Vitals/Pain Pain Assessment: 0-10 Pain Score: 7  Pain Location: L knee Pain Descriptors / Indicators: Aching;Sore Pain Intervention(s): Limited activity within patient's tolerance;Monitored during session;Premedicated before session;Ice applied    Home Living                      Prior Function            PT Goals (current goals can now be found in the care plan section) Acute Rehab PT Goals Patient Stated Goal: Resume previous lifestyle with decreased pain PT Goal Formulation: With patient Time For Goal Achievement: 04/23/14 Potential to Achieve Goals: Good Progress towards PT goals: Progressing toward goals    Frequency  7X/week    PT Plan Current plan remains appropriate    Co-evaluation             End of Session Equipment Utilized During Treatment: Gait belt;Left knee immobilizer Activity Tolerance: Patient tolerated treatment well Patient left: in chair;with call bell/phone within reach;with family/visitor present     Time: 8657-8469 PT Time Calculation (min): 41 min  Charges:  $Gait Training: 8-22 mins $Therapeutic Exercise: 8-22 mins $Therapeutic Activity: 8-22 mins  G Codes:      Christl Fessenden May 04, 2014, 10:04 AM

## 2014-04-17 NOTE — Evaluation (Signed)
Occupational Therapy Evaluation Patient Details Name: [name] MRN: [id number] DOB: 03/16/1961 Today's Date: 04/17/2014    History of Present Illness L TKR   Clinical Impression   Pt up to practice 3in1 transfer. Will benefit from continued OT to progress ADL independence and safety. Will follow.    Follow Up Recommendations  No OT follow up;Supervision/Assistance - 24 hour    Equipment Recommendations  None recommended by OT    Recommendations for Other Services       Precautions / Restrictions Precautions Precautions: Knee;Fall Required Braces or Orthoses: Knee Immobilizer - Left Knee Immobilizer - Left: Discontinue once straight leg raise with < 10 degree lag Restrictions Weight Bearing Restrictions: No Other Position/Activity Restrictions: WBAT      Mobility Bed Mobility Overal bed mobility: Needs Assistance Bed Mobility: Supine to Sit     Supine to sit: Min assist     General bed mobility comments: assist for L LE off the bed.  Transfers Overall transfer level: Needs assistance Equipment used: Rolling walker (2 wheeled) Transfers: Sit to/from Stand Sit to Stand: Min assist         General transfer comment: verbal cues hand placement and LE management.    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 explained how to use bench and pt would like to practice. Husband and  other family present for session. Pt needed some cues for safely manuevering walker in tight space in bathroom and for hand placement. She has a Sports administrator but states husband can help with LB self care.     Vision                     Perception     Praxis      Pertinent Vitals/Pain Pain Assessment: 0-10 Pain Score: 6  Pain Location: L knee Pain Descriptors / Indicators: Aching Pain Intervention(s): Repositioned;Ice applied     Hand Dominance Right   Extremity/Trunk Assessment Upper Extremity Assessment Upper Extremity Assessment: Overall WFL for tasks assessed           Communication Communication Communication: No difficulties   Cognition Arousal/Alertness: Awake/alert Behavior During Therapy: WFL for tasks assessed/performed Overall Cognitive Status: Within Functional Limits for tasks assessed                     General Comments          Shoulder Instructions      Home Living Family/patient expects to be discharged to:: Private residence Living Arrangements: Spouse/significant other Available Help at Discharge: Family Type of Home: House Home Access: Stairs to enter Secretary/administrator of Steps: 2 Entrance Stairs-Rails: None Home Layout: One level     Bathroom Shower/Tub: Chief Strategy Officer: Standard     Home Equipment: Environmental consultant -  2 wheels;Adaptive equipment;Bedside commode;Tub bench Adaptive Equipment: Reacher        Prior Functioning/Environment Level of Independence: Independent             OT Diagnosis: Generalized weakness   OT Problem List: Decreased strength;Decreased knowledge of use of DME or AE   OT Treatment/Interventions: Self-care/ADL training;Patient/family education;Therapeutic activities;DME and/or AE instruction    OT Goals(Current goals can be found in the care plan section) Acute Rehab OT Goals Patient Stated Goal: return to independence. OT Goal Formulation: With patient/family Time For  Goal Achievement: 04/24/14 Potential to Achieve Goals: Good  OT Frequency: Min 2X/week   Barriers to D/C:            Co-evaluation              End of Session Equipment Utilized During Treatment: Gait belt;Rolling walker;Left knee immobilizer  Activity Tolerance: Patient tolerated treatment well Patient left: in chair;with call bell/phone within reach;with family/visitor present   Time: 1130-1205 OT Time Calculation (min): 35 min Charges:  OT General Charges $OT Visit: 1 Procedure OT Evaluation $Initial OT Evaluation Tier I: 1 Procedure OT Treatments $Self Care/Home Management : 8-22 mins $Therapeutic Activity: 8-22 mins G-Codes:    [name] [phone] 04/17/2014, 12:16 PM

## 2014-04-18 LAB — CBC
HCT: 31.2 % — ABNORMAL LOW (ref 36.0–46.0)
Hemoglobin: 10.3 g/dL — ABNORMAL LOW (ref 12.0–15.0)
MCH: 29.3 pg (ref 26.0–34.0)
MCHC: 33 g/dL (ref 30.0–36.0)
MCV: 88.9 fL (ref 78.0–100.0)
Platelets: 188 10*3/uL (ref 150–400)
RBC: 3.51 MIL/uL — ABNORMAL LOW (ref 3.87–5.11)
RDW: 14.8 % (ref 11.5–15.5)
WBC: 9.1 10*3/uL (ref 4.0–10.5)

## 2014-04-18 LAB — BASIC METABOLIC PANEL
Anion gap: 13 (ref 5–15)
BUN: 10 mg/dL (ref 6–23)
CO2: 24 mEq/L (ref 19–32)
Calcium: 8.9 mg/dL (ref 8.4–10.5)
Chloride: 98 mEq/L (ref 96–112)
Creatinine, Ser: 0.72 mg/dL (ref 0.50–1.10)
GFR calc Af Amer: >90 mL/min (ref >90)
GFR calc non Af Amer: >90 mL/min (ref >90)
Glucose, Bld: 111 mg/dL — ABNORMAL HIGH (ref 70–99)
Potassium: 4.2 mEq/L (ref 3.7–5.3)
Sodium: 135 mEq/L — ABNORMAL LOW (ref 137–147)

## 2014-04-18 MED ORDER — OXYCODONE HCL 5 MG PO TABS
5.0000 mg | ORAL_TABLET | ORAL | Status: DC | PRN
  Administered 2014-04-18: 10 mg via ORAL
  Filled 2014-04-18: qty 2

## 2014-04-18 MED ORDER — METHOCARBAMOL 500 MG PO TABS: 500.0000 mg | ORAL_TABLET | Freq: Four times a day (QID) | ORAL | Status: DC | PRN

## 2014-04-18 MED ORDER — FERROUS SULFATE 325 (65 FE) MG PO TABS: 325.0000 mg | ORAL_TABLET | Freq: Three times a day (TID) | ORAL | Status: DC

## 2014-04-18 MED ORDER — RIVAROXABAN 10 MG PO TABS: 10.0000 mg | ORAL_TABLET | Freq: Every day | ORAL | Status: DC

## 2014-04-18 MED ORDER — OXYCODONE HCL 5 MG PO TABS: 5.0000 mg | ORAL_TABLET | ORAL | Status: DC | PRN

## 2014-04-18 NOTE — Progress Notes (Signed)
Physical Therapy Treatment Patient Details Name: [name] MRN: [id number] DOB: 1961-04-25 Today's Date: 04/18/2014    History of Present Illness L TKR    PT Comments    Progressing well and eager for d/c home  Follow Up Recommendations  Home health PT     Equipment Recommendations  None recommended by PT    Recommendations for Other Services OT consult     Precautions / Restrictions Precautions Precautions: Knee;Fall Required Braces or Orthoses: Knee Immobilizer - Left Knee Immobilizer - Left: Discontinue once straight leg raise with < 10 degree lag Restrictions Weight Bearing Restrictions: No Other Position/Activity Restrictions: WBAT    Mobility  Bed Mobility Overal bed mobility: Needs Assistance Bed Mobility: Sit to Supine       Sit to supine: Min assist   General bed mobility comments: cues for sequence, min assist for L LE onto bed  Transfers Overall transfer level: Needs assistance Equipment used: Rolling walker (2 wheeled) Transfers: Sit to/from Stand Sit to Stand: Min guard         General transfer comment: verbal cues hand placement and LE management.  Ambulation/Gait Ambulation/Gait assistance: Min guard;Supervision Ambulation Distance (Feet): 120 Feet Assistive device: Rolling walker (2 wheeled) Gait Pattern/deviations: Step-to pattern;Decreased step length - right;Decreased step length - left;Shuffle;Antalgic Gait velocity: decr   General Gait Details: cues for sequence, posture and position from RW   Stairs Stairs: Yes Stairs assistance: Min assist   Number of Stairs: 1 (single step 3 times with spouse assist on final) General stair comments: cues for sequence and foot/RW placement  Wheelchair Mobility    Modified Rankin (Stroke Patients Only)       Balance                                    Cognition Arousal/Alertness: Awake/alert Behavior During Therapy: WFL for tasks assessed/performed Overall  Cognitive Status: Within Functional Limits for tasks assessed                      Exercises Total Joint Exercises Ankle Circles/Pumps: AROM;Both;Supine;15 reps Quad Sets: AROM;Both;Supine;20 reps Heel Slides: AAROM;Left;Supine;20 reps Straight Leg Raises: AAROM;Left;Supine;20 reps Goniometric ROM: AAROM at L knee -10 - 45    General Comments        Pertinent Vitals/Pain Pain Assessment: 0-10 Pain Score: 6  Pain Location: L knee Pain Descriptors / Indicators: Aching;Sore Pain Intervention(s): Limited activity within patient's tolerance;Monitored during session;Premedicated before session;Ice applied    Home Living                      Prior Function            PT Goals (current goals can now be found in the care plan section) Acute Rehab PT Goals Patient Stated Goal: return to independence. PT Goal Formulation: With patient Time For Goal Achievement: 04/23/14 Potential to Achieve Goals: Good Progress towards PT goals: Progressing toward goals    Frequency  7X/week    PT Plan Current plan remains appropriate    Co-evaluation             End of Session Equipment Utilized During Treatment: Left knee immobilizer Activity Tolerance: Patient tolerated treatment well Patient left: in bed;with call bell/phone within reach;with family/visitor present     Time: 0930-1008 PT Time Calculation (min): 38 min  Charges:  $Gait Training: 8-22 mins $Therapeutic Exercise:  8-22 mins $Therapeutic Activity: 8-22 mins                    G Codes:      Olia Hinderliter 2014-05-03, 10:50 AM

## 2014-04-18 NOTE — Progress Notes (Signed)
Subjective: 2 Days Post-Op Procedure(s) (LRB): LEFT TOTAL KNEE ARTHROPLASTY (Left) Patient reports pain as 2 on 0-10 scale. Doing well and wound looks fine. Will DC   Objective: Vital signs in last 24 hours: Temp:  [97.8 F (36.6 C)-98.6 F (37 C)] 98.6 F (37 C) (09/25 0627) Pulse Rate:  [85-88] 86 (09/25 0627) Resp:  [16-20] 20 (09/25 0627) BP: (105-142)/(50-73) 142/73 mmHg (09/25 0627) SpO2:  [94 %-98 %] 98 % (09/25 0627)  Intake/Output from previous day: 09/24 0701 - 09/25 0700 In: 1660 [P.O.:960; I.V.:700] Out: 1350 [Urine:1350] Intake/Output this shift:     Recent Labs  04/17/14 0442 04/18/14 0410  HGB 11.5* 10.3*    Recent Labs  04/17/14 0442 04/18/14 0410  WBC 11.3* 9.1  RBC 3.96 3.51*  HCT 34.8* 31.2*  PLT 253 188    Recent Labs  04/17/14 0442 04/18/14 0410  NA 136* 135*  K 4.3 4.2  CL 98 98  CO2 24 24  BUN 11 10  CREATININE 0.78 0.72  GLUCOSE 135* 111*  CALCIUM 9.4 8.9   No results found for this basename: LABPT, INR,  in the last 72 hours  Neurovascular intact No cellulitis present Compartment soft  Assessment/Plan: 2 Days Post-Op Procedure(s) (LRB): LEFT TOTAL KNEE ARTHROPLASTY (Left) Discharge home with home health  Faythe Heitzenrater A 04/18/2014, 7:13 AM

## 2014-04-18 NOTE — Progress Notes (Signed)
Pt to d/c home with [facility] home health. No DME needs. AVS reviewed and "My Chart" discussed with pt. Pt capable of verbalizing medications, dressing changes, signs and symptoms of infection, and follow-up appointments. Remains hemodynamically stable. No signs and symptoms of distress. Educated pt to return to ER in the case of SOB, dizziness, or chest pain.  

## 2014-04-18 NOTE — Discharge Summary (Signed)
Physician Discharge Summary  Patient ID: [name] MRN: [id number] DOB/AGE: January 08, 1961 53 y.o.  Admit date: 04/16/2014 Discharge date: 04/18/2014  Admission Diagnoses:Osteoarthritis of Left Knee  Discharge Diagnoses: Osteoarthritis Left Knee Active Problems:   Total knee replacement status   Discharged Condition: good  Hospital Course: Physical Therapy  Consults: Physical Therapy  Significant Diagnostic Studies: CBC  Treatments: antibiotics: Clindomycin  Discharge Exam: Blood pressure 142/73, pulse 86, temperature 98.6 F (37 C), temperature source Oral, resp. rate 20, height  (1.626 m), weight 132.9 kg (292 lb 15.9 oz), SpO2 98.00%. Extremities: Homans sign is negative, no sign of DVT  Disposition: 01-Home or Self Care     Medication List    ASK your doctor about these medications       albuterol 108 (90 BASE) MCG/ACT inhaler  Commonly known as:  PROVENTIL HFA;VENTOLIN HFA  Inhale 2 puffs into the lungs every 6 (six) hours as needed for wheezing.     ALPRAZolam 0.5 MG tablet  Commonly known as:  XANAX  Take 0.5 mg by mouth 2 (two) times daily.     amLODipine 10 MG tablet  Commonly known as:  NORVASC  Take 10 mg by mouth every morning.     azithromycin 250 MG tablet  Commonly known as:  ZITHROMAX  Take as directed.     budesonide-formoterol 160-4.5 MCG/ACT inhaler  Commonly known as:  SYMBICORT  Inhale 1 puff into the lungs 2 (two) times daily.     furosemide 20 MG tablet  Commonly known as:  LASIX  Take 20 mg by mouth every morning.     HYDROcodone-acetaminophen 5-325 MG per tablet  Commonly known as:  NORCO/VICODIN  Take 1 tablet by mouth every 4 (four) hours as needed for moderate pain.     lisinopril 20 MG tablet  Commonly known as:  PRINIVIL,ZESTRIL  Take 20 mg by mouth every morning.     lithium carbonate 300 MG capsule  Take 300 mg by mouth at bedtime.     montelukast 10 MG tablet  Commonly known as:  SINGULAIR  Take 10 mg  by mouth at bedtime.     pravastatin 40 MG tablet  Commonly known as:  PRAVACHOL  Take 40 mg by mouth at bedtime.     promethazine 25 MG tablet  Commonly known as:  PHENERGAN  Take 25 mg by mouth every 6 (six) hours as needed for nausea or vomiting.     REFRESH OP  Place 2 drops into both eyes daily as needed (For dry eyes.).     sertraline 100 MG tablet  Commonly known as:  ZOLOFT  Take 100 mg by mouth 2 (two) times daily.     zolpidem 12.5 MG CR tablet  Commonly known as:  AMBIEN CR  Take 12.5 mg by mouth at bedtime.         Signed: Evalie Hargraves A 04/18/2014, 7:15 AM

## 2014-04-27 ENCOUNTER — Other Ambulatory Visit: Payer: Self-pay | Admitting: Family Medicine

## 2014-05-23 ENCOUNTER — Encounter (HOSPITAL_COMMUNITY): Payer: Self-pay | Admitting: Orthopedic Surgery

## 2014-05-23 ENCOUNTER — Encounter (HOSPITAL_COMMUNITY): Payer: BC Managed Care – PPO | Admitting: Certified Registered Nurse Anesthetist

## 2014-05-23 ENCOUNTER — Encounter (HOSPITAL_COMMUNITY)
Admission: AD | Disposition: A | Discharge status: Home / Self Care | Payer: Self-pay | Source: Ambulatory Visit | Attending: Orthopedic Surgery

## 2014-05-23 ENCOUNTER — Inpatient Hospital Stay (HOSPITAL_COMMUNITY)
Admission: AD | Admit: 2014-05-23 | Discharge: 2014-05-26 | DRG: 857 | Disposition: A | Discharge status: Home / Self Care | Payer: BC Managed Care – PPO | Source: Ambulatory Visit | Attending: Orthopedic Surgery | Admitting: Orthopedic Surgery

## 2014-05-23 ENCOUNTER — Inpatient Hospital Stay (HOSPITAL_COMMUNITY): Payer: BC Managed Care – PPO | Admitting: Certified Registered Nurse Anesthetist

## 2014-05-23 DIAGNOSIS — T814XXA Infection following a procedure, initial encounter: Secondary | ICD-10-CM | POA: Diagnosis present

## 2014-05-23 DIAGNOSIS — I10 Essential (primary) hypertension: Secondary | ICD-10-CM | POA: Diagnosis present

## 2014-05-23 DIAGNOSIS — L0889 Other specified local infections of the skin and subcutaneous tissue: Secondary | ICD-10-CM | POA: Diagnosis present

## 2014-05-23 DIAGNOSIS — T8454XA Infection and inflammatory reaction due to internal left knee prosthesis, initial encounter: Secondary | ICD-10-CM

## 2014-05-23 DIAGNOSIS — Z96652 Presence of left artificial knee joint: Secondary | ICD-10-CM | POA: Diagnosis present

## 2014-05-23 DIAGNOSIS — T8131XA Disruption of external operation (surgical) wound, not elsewhere classified, initial encounter: Secondary | ICD-10-CM | POA: Diagnosis present

## 2014-05-23 HISTORY — PX: IRRIGATION AND DEBRIDEMENT KNEE: SHX5185

## 2014-05-23 LAB — CBC WITH DIFFERENTIAL/PLATELET
Basophils Absolute: 0 10*3/uL (ref 0.0–0.1)
Basophils Relative: 0 % (ref 0–1)
Eosinophils Absolute: 0.1 10*3/uL (ref 0.0–0.7)
Eosinophils Relative: 1 % (ref 0–5)
HCT: 35.8 % — ABNORMAL LOW (ref 36.0–46.0)
Hemoglobin: 11.5 g/dL — ABNORMAL LOW (ref 12.0–15.0)
Lymphocytes Relative: 27 % (ref 12–46)
Lymphs Abs: 2 10*3/uL (ref 0.7–4.0)
MCH: 28.3 pg (ref 26.0–34.0)
MCHC: 32.1 g/dL (ref 30.0–36.0)
MCV: 88.2 fL (ref 78.0–100.0)
Monocytes Absolute: 0.5 10*3/uL (ref 0.1–1.0)
Monocytes Relative: 7 % (ref 3–12)
Neutro Abs: 4.8 10*3/uL (ref 1.7–7.7)
Neutrophils Relative %: 65 % (ref 43–77)
Platelets: 291 10*3/uL (ref 150–400)
RBC: 4.06 MIL/uL (ref 3.87–5.11)
RDW: 14.3 % (ref 11.5–15.5)
WBC: 7.4 10*3/uL (ref 4.0–10.5)

## 2014-05-23 LAB — COMPREHENSIVE METABOLIC PANEL
ALT: 11 U/L (ref 0–35)
AST: 13 U/L (ref 0–37)
Albumin: 3.6 g/dL (ref 3.5–5.2)
Alkaline Phosphatase: 149 U/L — ABNORMAL HIGH (ref 39–117)
Anion gap: 15 (ref 5–15)
BUN: 13 mg/dL (ref 6–23)
CO2: 23 mEq/L (ref 19–32)
Calcium: 9.5 mg/dL (ref 8.4–10.5)
Chloride: 98 mEq/L (ref 96–112)
Creatinine, Ser: 0.73 mg/dL (ref 0.50–1.10)
GFR calc Af Amer: >90 mL/min (ref >90)
GFR calc non Af Amer: >90 mL/min (ref >90)
Glucose, Bld: 96 mg/dL (ref 70–99)
Potassium: 3.9 mEq/L (ref 3.7–5.3)
Sodium: 136 mEq/L — ABNORMAL LOW (ref 137–147)
Total Bilirubin: <0.2 mg/dL — ABNORMAL LOW (ref 0.3–1.2)
Total Protein: 7.9 g/dL (ref 6.0–8.3)

## 2014-05-23 LAB — APTT: aPTT: 32 seconds (ref 24–37)

## 2014-05-23 LAB — SURGICAL PCR SCREEN
MRSA, PCR: NEGATIVE
Staphylococcus aureus: NEGATIVE

## 2014-05-23 LAB — PROTIME-INR
INR: 1.08 (ref 0.00–1.49)
Prothrombin Time: 14.2 seconds (ref 11.6–15.2)

## 2014-05-23 SURGERY — IRRIGATION AND DEBRIDEMENT KNEE
Anesthesia: General | Site: Knee | Laterality: Left

## 2014-05-23 MED ORDER — MAGNESIUM CITRATE PO SOLN: 1.0000 | Freq: Once | ORAL | Status: AC | PRN

## 2014-05-23 MED ORDER — FENTANYL CITRATE 0.05 MG/ML IJ SOLN
INTRAMUSCULAR | Status: AC
  Filled 2014-05-23: qty 2

## 2014-05-23 MED ORDER — HYDROMORPHONE HCL 1 MG/ML IJ SOLN
0.5000 mg | INTRAMUSCULAR | Status: DC | PRN
  Administered 2014-05-23 (×2): 1 mg via INTRAVENOUS
  Filled 2014-05-23 (×2): qty 1

## 2014-05-23 MED ORDER — LIDOCAINE HCL (CARDIAC) 20 MG/ML IV SOLN
INTRAVENOUS | Status: DC | PRN
  Administered 2014-05-23: 100 mg via INTRAVENOUS

## 2014-05-23 MED ORDER — SODIUM CHLORIDE 0.9 % IJ SOLN
INTRAMUSCULAR | Status: AC
  Filled 2014-05-23: qty 10

## 2014-05-23 MED ORDER — VANCOMYCIN HCL 1000 MG IV SOLR
INTRAVENOUS | Status: AC
  Filled 2014-05-23: qty 1000

## 2014-05-23 MED ORDER — METOCLOPRAMIDE HCL 5 MG/ML IJ SOLN
5.0000 mg | Freq: Three times a day (TID) | INTRAMUSCULAR | Status: DC | PRN
Start: 2014-05-23 — End: 2014-05-26

## 2014-05-23 MED ORDER — PROPOFOL 10 MG/ML IV BOLUS
INTRAVENOUS | Status: DC | PRN
  Administered 2014-05-23: 200 mg via INTRAVENOUS

## 2014-05-23 MED ORDER — PROMETHAZINE HCL 25 MG/ML IJ SOLN: 6.2500 mg | INTRAMUSCULAR | Status: DC | PRN

## 2014-05-23 MED ORDER — ONDANSETRON HCL 4 MG PO TABS
4.0000 mg | ORAL_TABLET | Freq: Four times a day (QID) | ORAL | Status: DC | PRN
Start: 2014-05-23 — End: 2014-05-26

## 2014-05-23 MED ORDER — SERTRALINE HCL 100 MG PO TABS
100.0000 mg | ORAL_TABLET | Freq: Two times a day (BID) | ORAL | Status: DC
  Administered 2014-05-24 – 2014-05-26 (×6): 100 mg via ORAL
  Filled 2014-05-23 (×7): qty 1

## 2014-05-23 MED ORDER — CLINDAMYCIN PHOSPHATE 900 MG/50ML IV SOLN
INTRAVENOUS | Status: AC
  Filled 2014-05-23: qty 50

## 2014-05-23 MED ORDER — PROMETHAZINE HCL 25 MG PO TABS: 25.0000 mg | ORAL_TABLET | Freq: Four times a day (QID) | ORAL | Status: DC | PRN

## 2014-05-23 MED ORDER — PHENOL 1.4 % MT LIQD
1.0000 | OROMUCOSAL | Status: DC | PRN
  Filled 2014-05-23: qty 177

## 2014-05-23 MED ORDER — METOCLOPRAMIDE HCL 10 MG PO TABS: 5.0000 mg | ORAL_TABLET | Freq: Three times a day (TID) | ORAL | Status: DC | PRN

## 2014-05-23 MED ORDER — ONDANSETRON HCL 4 MG/2ML IJ SOLN
4.0000 mg | Freq: Four times a day (QID) | INTRAMUSCULAR | Status: DC | PRN
Start: 2014-05-23 — End: 2014-05-23

## 2014-05-23 MED ORDER — FENTANYL CITRATE 0.05 MG/ML IJ SOLN: 25.0000 ug | INTRAMUSCULAR | Status: DC | PRN

## 2014-05-23 MED ORDER — MONTELUKAST SODIUM 10 MG PO TABS
10.0000 mg | ORAL_TABLET | Freq: Every day | ORAL | Status: DC
  Administered 2014-05-24 – 2014-05-25 (×3): 10 mg via ORAL
  Filled 2014-05-23 (×4): qty 1

## 2014-05-23 MED ORDER — SODIUM CHLORIDE 0.9 % IV SOLN
INTRAVENOUS | Status: DC
  Administered 2014-05-24: 01:00:00 via INTRAVENOUS
  Filled 2014-05-23 (×7): qty 1000

## 2014-05-23 MED ORDER — FENTANYL CITRATE 0.05 MG/ML IJ SOLN
INTRAMUSCULAR | Status: DC | PRN
  Administered 2014-05-23 (×2): 50 ug via INTRAVENOUS
  Administered 2014-05-23: 25 ug via INTRAVENOUS
  Administered 2014-05-23: 50 ug via INTRAVENOUS
  Administered 2014-05-23: 25 ug via INTRAVENOUS

## 2014-05-23 MED ORDER — DIPHENHYDRAMINE HCL 25 MG PO CAPS: 25.0000 mg | ORAL_CAPSULE | Freq: Four times a day (QID) | ORAL | Status: DC | PRN

## 2014-05-23 MED ORDER — BUDESONIDE-FORMOTEROL FUMARATE 160-4.5 MCG/ACT IN AERO
1.0000 | INHALATION_SPRAY | Freq: Two times a day (BID) | RESPIRATORY_TRACT | Status: DC
  Administered 2014-05-24 – 2014-05-26 (×5): 1 via RESPIRATORY_TRACT
  Filled 2014-05-23: qty 6

## 2014-05-23 MED ORDER — ONDANSETRON HCL 4 MG PO TABS: 4.0000 mg | ORAL_TABLET | Freq: Four times a day (QID) | ORAL | Status: DC | PRN

## 2014-05-23 MED ORDER — DOCUSATE SODIUM 100 MG PO CAPS
100.0000 mg | ORAL_CAPSULE | Freq: Two times a day (BID) | ORAL | Status: DC
  Administered 2014-05-24 – 2014-05-26 (×6): 100 mg via ORAL

## 2014-05-23 MED ORDER — LITHIUM CARBONATE 300 MG PO CAPS
300.0000 mg | ORAL_CAPSULE | Freq: Every day | ORAL | Status: DC
  Administered 2014-05-24 – 2014-05-25 (×3): 300 mg via ORAL
  Filled 2014-05-23 (×4): qty 1

## 2014-05-23 MED ORDER — ACETAMINOPHEN 325 MG PO TABS: 650.0000 mg | ORAL_TABLET | Freq: Four times a day (QID) | ORAL | Status: DC | PRN

## 2014-05-23 MED ORDER — ASPIRIN EC 325 MG PO TBEC
325.0000 mg | DELAYED_RELEASE_TABLET | Freq: Two times a day (BID) | ORAL | Status: DC
  Administered 2014-05-24: 325 mg via ORAL
  Filled 2014-05-23 (×3): qty 1

## 2014-05-23 MED ORDER — MIDAZOLAM HCL 2 MG/2ML IJ SOLN
INTRAMUSCULAR | Status: AC
  Filled 2014-05-23: qty 2

## 2014-05-23 MED ORDER — POLYETHYLENE GLYCOL 3350 17 G PO PACK
17.0000 g | PACK | Freq: Two times a day (BID) | ORAL | Status: DC
  Administered 2014-05-24 – 2014-05-26 (×6): 17 g via ORAL

## 2014-05-23 MED ORDER — ALBUTEROL SULFATE (2.5 MG/3ML) 0.083% IN NEBU: 2.5000 mg | INHALATION_SOLUTION | Freq: Four times a day (QID) | RESPIRATORY_TRACT | Status: DC | PRN

## 2014-05-23 MED ORDER — ONDANSETRON HCL 4 MG/2ML IJ SOLN
INTRAMUSCULAR | Status: DC | PRN
  Administered 2014-05-23: 4 mg via INTRAVENOUS

## 2014-05-23 MED ORDER — MIDAZOLAM HCL 5 MG/5ML IJ SOLN
INTRAMUSCULAR | Status: DC | PRN
  Administered 2014-05-23: 2 mg via INTRAVENOUS

## 2014-05-23 MED ORDER — CLINDAMYCIN PHOSPHATE 900 MG/50ML IV SOLN
900.0000 mg | Freq: Once | INTRAVENOUS | Status: AC
  Administered 2014-05-23: 900 mg via INTRAVENOUS

## 2014-05-23 MED ORDER — POLYETHYLENE GLYCOL 3350 17 G PO PACK: 17.0000 g | PACK | Freq: Every day | ORAL | Status: DC | PRN

## 2014-05-23 MED ORDER — 0.9 % SODIUM CHLORIDE (POUR BTL) OPTIME
TOPICAL | Status: DC | PRN
  Administered 2014-05-23: 1000 mL

## 2014-05-23 MED ORDER — MENTHOL 3 MG MT LOZG
1.0000 | LOZENGE | OROMUCOSAL | Status: DC | PRN
  Filled 2014-05-23: qty 9

## 2014-05-23 MED ORDER — LIDOCAINE HCL (CARDIAC) 20 MG/ML IV SOLN
INTRAVENOUS | Status: AC
  Filled 2014-05-23: qty 5

## 2014-05-23 MED ORDER — METHOCARBAMOL 500 MG PO TABS
500.0000 mg | ORAL_TABLET | Freq: Four times a day (QID) | ORAL | Status: DC | PRN
  Administered 2014-05-24 – 2014-05-26 (×6): 500 mg via ORAL
  Filled 2014-05-23 (×6): qty 1

## 2014-05-23 MED ORDER — ALUM & MAG HYDROXIDE-SIMETH 200-200-20 MG/5ML PO SUSP
30.0000 mL | ORAL | Status: DC | PRN
  Administered 2014-05-25: 30 mL via ORAL
  Filled 2014-05-23: qty 30

## 2014-05-23 MED ORDER — ACETAMINOPHEN 650 MG RE SUPP: 650.0000 mg | Freq: Four times a day (QID) | RECTAL | Status: DC | PRN

## 2014-05-23 MED ORDER — DEXTROSE 5 % IV SOLN
500.0000 mg | Freq: Four times a day (QID) | INTRAVENOUS | Status: DC | PRN
  Filled 2014-05-23: qty 5

## 2014-05-23 MED ORDER — DEXAMETHASONE SODIUM PHOSPHATE 10 MG/ML IJ SOLN
10.0000 mg | Freq: Once | INTRAMUSCULAR | Status: AC
  Administered 2014-05-24: 10 mg via INTRAVENOUS
  Filled 2014-05-23: qty 1

## 2014-05-23 MED ORDER — VANCOMYCIN HCL 1000 MG IV SOLR
INTRAVENOUS | Status: DC | PRN
  Administered 2014-05-23: 1000 mg via TOPICAL

## 2014-05-23 MED ORDER — DEXAMETHASONE SODIUM PHOSPHATE 10 MG/ML IJ SOLN
INTRAMUSCULAR | Status: DC | PRN
  Administered 2014-05-23: 10 mg via INTRAVENOUS

## 2014-05-23 MED ORDER — POLYMYXIN B SULFATE 500000 UNITS IJ SOLR
INTRAMUSCULAR | Status: AC
  Filled 2014-05-23: qty 1

## 2014-05-23 MED ORDER — HYDROMORPHONE HCL 1 MG/ML IJ SOLN
INTRAMUSCULAR | Status: AC
  Administered 2014-05-25: 1 mg via INTRAVENOUS
  Filled 2014-05-23: qty 1

## 2014-05-23 MED ORDER — PROPOFOL 10 MG/ML IV BOLUS
INTRAVENOUS | Status: AC
Start: 2014-05-23 — End: 2014-05-23
  Filled 2014-05-23: qty 20

## 2014-05-23 MED ORDER — EPHEDRINE SULFATE 50 MG/ML IJ SOLN
INTRAMUSCULAR | Status: DC | PRN
  Administered 2014-05-23: 10 mg via INTRAVENOUS

## 2014-05-23 MED ORDER — ALUM & MAG HYDROXIDE-SIMETH 200-200-20 MG/5ML PO SUSP: 30.0000 mL | Freq: Four times a day (QID) | ORAL | Status: DC | PRN

## 2014-05-23 MED ORDER — LISINOPRIL 20 MG PO TABS
20.0000 mg | ORAL_TABLET | Freq: Every morning | ORAL | Status: DC
  Administered 2014-05-24 – 2014-05-26 (×3): 20 mg via ORAL
  Filled 2014-05-23 (×3): qty 1

## 2014-05-23 MED ORDER — AMLODIPINE BESYLATE 10 MG PO TABS
10.0000 mg | ORAL_TABLET | Freq: Every morning | ORAL | Status: DC
  Administered 2014-05-24 – 2014-05-26 (×3): 10 mg via ORAL
  Filled 2014-05-23 (×3): qty 1

## 2014-05-23 MED ORDER — EPHEDRINE SULFATE 50 MG/ML IJ SOLN
INTRAMUSCULAR | Status: AC
  Filled 2014-05-23: qty 1

## 2014-05-23 MED ORDER — GLYCOPYRROLATE 0.2 MG/ML IJ SOLN
INTRAMUSCULAR | Status: AC
  Filled 2014-05-23: qty 1

## 2014-05-23 MED ORDER — DOCUSATE SODIUM 100 MG PO CAPS: 100.0000 mg | ORAL_CAPSULE | Freq: Two times a day (BID) | ORAL | Status: DC

## 2014-05-23 MED ORDER — SODIUM CHLORIDE 0.9 % IJ SOLN: 3.0000 mL | Freq: Two times a day (BID) | INTRAMUSCULAR | Status: DC

## 2014-05-23 MED ORDER — CELECOXIB 200 MG PO CAPS
200.0000 mg | ORAL_CAPSULE | Freq: Two times a day (BID) | ORAL | Status: DC
  Administered 2014-05-24 – 2014-05-26 (×6): 200 mg via ORAL
  Filled 2014-05-23 (×7): qty 1

## 2014-05-23 MED ORDER — HYDROMORPHONE HCL 1 MG/ML IJ SOLN
0.5000 mg | INTRAMUSCULAR | Status: DC | PRN
  Administered 2014-05-24 – 2014-05-25 (×4): 1 mg via INTRAVENOUS
  Filled 2014-05-23 (×4): qty 1

## 2014-05-23 MED ORDER — SODIUM CHLORIDE 0.9 % IV SOLN
INTRAVENOUS | Status: DC
  Administered 2014-05-23: 100 mL/h via INTRAVENOUS

## 2014-05-23 MED ORDER — LACTATED RINGERS IV SOLN
INTRAVENOUS | Status: DC | PRN
Start: 2014-05-23 — End: 2014-05-23
  Administered 2014-05-23 (×2): via INTRAVENOUS

## 2014-05-23 MED ORDER — SODIUM CHLORIDE 0.9 % IJ SOLN: 3.0000 mL | INTRAMUSCULAR | Status: DC | PRN

## 2014-05-23 MED ORDER — ONDANSETRON HCL 4 MG/2ML IJ SOLN: 4.0000 mg | Freq: Four times a day (QID) | INTRAMUSCULAR | Status: DC | PRN

## 2014-05-23 MED ORDER — ZOLPIDEM TARTRATE 5 MG PO TABS
5.0000 mg | ORAL_TABLET | Freq: Every evening | ORAL | Status: DC | PRN
  Administered 2014-05-24 – 2014-05-25 (×3): 5 mg via ORAL
  Filled 2014-05-23 (×3): qty 1

## 2014-05-23 MED ORDER — VANCOMYCIN HCL 10 G IV SOLR
1500.0000 mg | INTRAVENOUS | Status: AC
  Administered 2014-05-23: 1500 mg via INTRAVENOUS
  Filled 2014-05-23: qty 1500

## 2014-05-23 MED ORDER — BISACODYL 10 MG RE SUPP: 10.0000 mg | Freq: Every day | RECTAL | Status: DC | PRN

## 2014-05-23 MED ORDER — SODIUM CHLORIDE 0.9 % IR SOLN
Status: DC | PRN
  Administered 2014-05-23: 9000 mL

## 2014-05-23 MED ORDER — ALPRAZOLAM 0.5 MG PO TABS
0.5000 mg | ORAL_TABLET | Freq: Two times a day (BID) | ORAL | Status: DC
  Administered 2014-05-24 – 2014-05-26 (×6): 0.5 mg via ORAL
  Filled 2014-05-23 (×6): qty 1

## 2014-05-23 MED ORDER — ALBUTEROL SULFATE HFA 108 (90 BASE) MCG/ACT IN AERS: 2.0000 | INHALATION_SPRAY | Freq: Four times a day (QID) | RESPIRATORY_TRACT | Status: DC | PRN

## 2014-05-23 MED ORDER — HYDROMORPHONE HCL 1 MG/ML IJ SOLN
0.2500 mg | INTRAMUSCULAR | Status: DC | PRN
  Administered 2014-05-23 (×4): 0.5 mg via INTRAVENOUS

## 2014-05-23 MED ORDER — PRAVASTATIN SODIUM 40 MG PO TABS
40.0000 mg | ORAL_TABLET | Freq: Every day | ORAL | Status: DC
  Administered 2014-05-24 – 2014-05-25 (×3): 40 mg via ORAL
  Filled 2014-05-23 (×4): qty 1

## 2014-05-23 MED ORDER — OXYCODONE HCL 5 MG PO TABS
5.0000 mg | ORAL_TABLET | ORAL | Status: DC
  Administered 2014-05-24 (×2): 15 mg via ORAL
  Administered 2014-05-24: 5 mg via ORAL
  Administered 2014-05-24 (×3): 15 mg via ORAL
  Administered 2014-05-25: 10 mg via ORAL
  Administered 2014-05-25 (×3): 15 mg via ORAL
  Administered 2014-05-26 (×3): 10 mg via ORAL
  Filled 2014-05-23: qty 1
  Filled 2014-05-23 (×3): qty 3
  Filled 2014-05-23: qty 2
  Filled 2014-05-23 (×5): qty 3
  Filled 2014-05-23: qty 2
  Filled 2014-05-23: qty 3
  Filled 2014-05-23: qty 2
  Filled 2014-05-23: qty 3

## 2014-05-23 MED ORDER — FERROUS SULFATE 325 (65 FE) MG PO TABS
325.0000 mg | ORAL_TABLET | Freq: Three times a day (TID) | ORAL | Status: DC
  Administered 2014-05-24 – 2014-05-26 (×7): 325 mg via ORAL
  Filled 2014-05-23 (×10): qty 1

## 2014-05-23 MED ORDER — HYDROMORPHONE HCL 1 MG/ML IJ SOLN
INTRAMUSCULAR | Status: AC
  Administered 2014-05-24: 1 mg via INTRAVENOUS
  Filled 2014-05-23: qty 1

## 2014-05-23 MED ORDER — FUROSEMIDE 20 MG PO TABS
20.0000 mg | ORAL_TABLET | Freq: Every morning | ORAL | Status: DC
  Administered 2014-05-24 – 2014-05-26 (×3): 20 mg via ORAL
  Filled 2014-05-23 (×3): qty 1

## 2014-05-23 MED ORDER — SODIUM CHLORIDE 0.9 % IV SOLN: 250.0000 mL | INTRAVENOUS | Status: DC | PRN

## 2014-05-23 MED ORDER — CLINDAMYCIN PHOSPHATE 600 MG/50ML IV SOLN
600.0000 mg | Freq: Four times a day (QID) | INTRAVENOUS | Status: AC
  Administered 2014-05-24 (×2): 600 mg via INTRAVENOUS
  Filled 2014-05-23 (×2): qty 50

## 2014-05-23 MED ORDER — FLEET ENEMA 7-19 GM/118ML RE ENEM: 1.0000 | ENEMA | Freq: Once | RECTAL | Status: DC | PRN

## 2014-05-23 SURGICAL SUPPLY — 53 items
BAG SPEC THK2 15X12 ZIP CLS (MISCELLANEOUS) ×1
BAG ZIPLOCK 12X15 (MISCELLANEOUS) ×2 IMPLANT
BANDAGE ELASTIC 6 VELCRO ST LF (GAUZE/BANDAGES/DRESSINGS) ×1 IMPLANT
BANDAGE ESMARK 6X9 LF (GAUZE/BANDAGES/DRESSINGS) ×1 IMPLANT
BNDG CMPR 9X6 STRL LF SNTH (GAUZE/BANDAGES/DRESSINGS) ×1
BNDG ESMARK 6X9 LF (GAUZE/BANDAGES/DRESSINGS) ×2
CUFF TOURN SGL QUICK 34 (TOURNIQUET CUFF) ×2
CUFF TRNQT CYL 34X4X40X1 (TOURNIQUET CUFF) ×1 IMPLANT
DRAPE EXTREMITY TIBURON (DRAPES) ×2 IMPLANT
DRSG ADAPTIC 3X8 NADH LF (GAUZE/BANDAGES/DRESSINGS) ×1 IMPLANT
DRSG AQUACEL AG ADV 3.5X10 (GAUZE/BANDAGES/DRESSINGS) ×1 IMPLANT
DRSG PAD ABDOMINAL 8X10 ST (GAUZE/BANDAGES/DRESSINGS) ×2 IMPLANT
DRSG TEGADERM 4X4.75 (GAUZE/BANDAGES/DRESSINGS) ×1 IMPLANT
DURAPREP 26ML APPLICATOR (WOUND CARE) ×2 IMPLANT
ELECT REM PT RETURN 9FT ADLT (ELECTROSURGICAL) ×2
ELECTRODE REM PT RTRN 9FT ADLT (ELECTROSURGICAL) ×1 IMPLANT
EVACUATOR 1/8 PVC DRAIN (DRAIN) ×2 IMPLANT
GAUZE SPONGE 2X2 8PLY STRL LF (GAUZE/BANDAGES/DRESSINGS) ×1 IMPLANT
GAUZE SPONGE 4X4 12PLY STRL (GAUZE/BANDAGES/DRESSINGS) ×4 IMPLANT
GAUZE XEROFORM 5X9 LF (GAUZE/BANDAGES/DRESSINGS) ×1 IMPLANT
GLOVE BIOGEL PI IND STRL 7.5 (GLOVE) ×1 IMPLANT
GLOVE BIOGEL PI IND STRL 8.5 (GLOVE) ×1 IMPLANT
GLOVE BIOGEL PI INDICATOR 7.5 (GLOVE) ×1
GLOVE BIOGEL PI INDICATOR 8.5 (GLOVE) ×1
GLOVE ECLIPSE 8.0 STRL XLNG CF (GLOVE) ×1 IMPLANT
GLOVE ORTHO TXT STRL SZ7.5 (GLOVE) ×4 IMPLANT
GLOVE SURG ORTHO 8.0 STRL STRW (GLOVE) ×2 IMPLANT
GOWN SPEC L3 XXLG W/TWL (GOWN DISPOSABLE) ×4 IMPLANT
GOWN STRL REUS W/TWL LRG LVL3 (GOWN DISPOSABLE) ×2 IMPLANT
HANDPIECE INTERPULSE COAX TIP (DISPOSABLE) ×2
IMMOBILIZER KNEE 20 (SOFTGOODS) ×2
IMMOBILIZER KNEE 20 THIGH 36 (SOFTGOODS) IMPLANT
INSERT PFC SIG STB SZ3 15.0MM (Knees) ×1 IMPLANT
KIT BASIN OR (CUSTOM PROCEDURE TRAY) ×2 IMPLANT
LIQUID BAND (GAUZE/BANDAGES/DRESSINGS) ×2 IMPLANT
MANIFOLD NEPTUNE II (INSTRUMENTS) ×2 IMPLANT
PACK TOTAL JOINT (CUSTOM PROCEDURE TRAY) ×2 IMPLANT
PAD ABD 8X10 STRL (GAUZE/BANDAGES/DRESSINGS) ×4 IMPLANT
PADDING CAST COTTON 6X4 STRL (CAST SUPPLIES) ×4 IMPLANT
POSITIONER SURGICAL ARM (MISCELLANEOUS) ×2 IMPLANT
SET HNDPC FAN SPRY TIP SCT (DISPOSABLE) ×1 IMPLANT
SPONGE GAUZE 2X2 STER 10/PKG (GAUZE/BANDAGES/DRESSINGS)
STAPLER VISISTAT 35W (STAPLE) ×2 IMPLANT
SUT MNCRL AB 4-0 PS2 18 (SUTURE) ×2 IMPLANT
SUT PDS AB 1 CT1 27 (SUTURE) ×4 IMPLANT
SUT VIC AB 1 CT1 36 (SUTURE) ×4 IMPLANT
SUT VIC AB 2-0 CT1 27 (SUTURE) ×6
SUT VIC AB 2-0 CT1 TAPERPNT 27 (SUTURE) ×3 IMPLANT
SUT VLOC 180 0 24IN GS25 (SUTURE) ×2 IMPLANT
SWAB COLLECTION DEVICE MRSA (MISCELLANEOUS) ×2 IMPLANT
TOWEL OR 17X26 10 PK STRL BLUE (TOWEL DISPOSABLE) ×4 IMPLANT
TUBE ANAEROBIC SPECIMEN COL (MISCELLANEOUS) ×2 IMPLANT
WRAP KNEE MAXI GEL POST OP (GAUZE/BANDAGES/DRESSINGS) ×1 IMPLANT

## 2014-05-23 NOTE — Interval H&P Note (Signed)
History and Physical Interval Note:  05/23/2014 8:11 PM  [name]  has presented today for surgery, with the diagnosis of infected left knee  The various methods of treatment have been discussed with the patient and family. After consideration of risks, benefits and other options for treatment, the patient has consented to  Procedure(s): IRRIGATION AND DEBRIDEMENT KNEE (Left) as a surgical intervention .  The patient's history has been reviewed, patient examined, no change in status, stable for surgery.  I have reviewed the patient's chart and labs.  Questions were answered to the patient's satisfaction.     Shelda PalLIN,Imaan Padgett D

## 2014-05-23 NOTE — Anesthesia Preprocedure Evaluation (Signed)
Anesthesia Evaluation  Patient identified by MRN, date of birth, ID band Patient awake    Reviewed: Allergy & Precautions, H&P , NPO status , Patient's Chart, lab work & pertinent test results  History of Anesthesia Complications (+) PONV and history of anesthetic complications  Airway Mallampati: II  TM Distance: >3 FB Neck ROM: Full    Dental no notable dental hx.    Pulmonary shortness of breath, asthma , sleep apnea ,  breath sounds clear to auscultation  Pulmonary exam normal       Cardiovascular hypertension, Pt. on medications Rhythm:Regular Rate:Normal     Neuro/Psych  Headaches, PSYCHIATRIC DISORDERS Anxiety Depression    GI/Hepatic Neg liver ROS, GERD-  ,  Endo/Other  negative endocrine ROS  Renal/GU negative Renal ROS  negative genitourinary   Musculoskeletal  (+) Arthritis -,   Abdominal   Peds negative pediatric ROS (+)  Hematology  (+) anemia ,   Anesthesia Other Findings   Reproductive/Obstetrics negative OB ROS                             Anesthesia Physical Anesthesia Plan  ASA: III  Anesthesia Plan: General   Post-op Pain Management:    Induction: Intravenous  Airway Management Planned: LMA  Additional Equipment:   Intra-op Plan:   Post-operative Plan: Extubation in OR  Informed Consent: I have reviewed the patients History and Physical, chart, labs and discussed the procedure including the risks, benefits and alternatives for the proposed anesthesia with the patient or authorized representative who has indicated his/her understanding and acceptance.   Dental advisory given  Plan Discussed with: CRNA  Anesthesia Plan Comments:         Anesthesia Quick Evaluation

## 2014-05-23 NOTE — Transfer of Care (Signed)
Immediate Anesthesia Transfer of Care Note  Patient: [name]  Procedure(s) Performed: Procedure(s): IRRIGATION AND DEBRIDEMENT KNEE (Left)  Patient Location: PACU  Anesthesia Type:General  Level of Consciousness: sedated  Airway & Oxygen Therapy: Patient Spontanous Breathing and Patient connected to face mask oxygen  Post-op Assessment: Report given to PACU RN and Post -op Vital signs reviewed and stable  Post vital signs: Reviewed and stable  Complications: No apparent anesthesia complications

## 2014-05-23 NOTE — Brief Op Note (Signed)
05/23/2014  9:59 PM  PATIENT:  [name]  53 y.o. female  PRE-OPERATIVE DIAGNOSIS:  Superficial knee infection with non-healing proximal wound  POST-OPERATIVE DIAGNOSIS: Superficial knee infection with non-healing proximal wound  PROCEDURE:  Procedure(s): 1. Sharp excisional debridement with a scalpel of skin, subcutaneous and non viable, fibrous tissue, tendon 2.  Non-excisional debridement, irrigation of superficial and depp knee with combined 9 liters NS with pulse lavage 3. Polyethylene exchange (3X15 PS)  SURGEON:  Surgeon(s) and Role:    * Shelda PalMatthew D Kristiann Noyce, MD - Primary  PHYSICIAN ASSISTANT: Lanney GinsMatthew Babish, PA-C  ANESTHESIA:   general  EBL:  Total I/O In: 1000 [I.V.:1000] Out: -   BLOOD ADMINISTERED:none  DRAINS: (1 medium ) Hemovact drain(s) in the left knee  with  Suction Open   LOCAL MEDICATIONS USED:  NONE  SPECIMEN:  No Specimen  DISPOSITION OF SPECIMEN:  N/A  COUNTS:  YES  TOURNIQUET:  57 minutes at 250 mmHg   DICTATION: .Other Dictation: Dictation Number [id number]  PLAN OF CARE: Admit to inpatient   PATIENT DISPOSITION:  PACU - hemodynamically stable.   Delay start of Pharmacological VTE agent (>24hrs) due to surgical blood loss or risk of bleeding: yes

## 2014-05-23 NOTE — Progress Notes (Signed)
Utilization review completed.  

## 2014-05-23 NOTE — H&P (Signed)
[name] is an 53 y.o. female.   Chief Complaint: let knee draiange HPI: [name] has a history of a left total knee arthroplasty 5 weeks ago. She initially did well but developed some drainage from her incision over the past 10 days. She denies fever and chills. She has been placed on Keflex which caused severe nausea and vomiting. She has since been placed on Doxycycline. She has seen progressively worsening drainage and dehiscence over the incision. Cultures taken in the office on Wednesday May 21, 2014 showed e.coli. Blood work from the same day shows elevated ESR and CRP but no increase in WBC. However, she was on antibiotics at the time the blood work was taken. Dr. Charlann Boxer consulted on the patient in the office and felt that she should be taken to the OR for I&D of the left total knee.   Past Medical History  Diagnosis Date  . Anxiety   . Depression   . Hypertension   . High cholesterol   . GERD (gastroesophageal reflux disease)   . Colon polyps   . Asthma   . PONV (postoperative nausea and vomiting)   . Arthritis   . Anemia     hx of with pregnancy   . Sleep apnea     has not use CPAP machine in 2 yrs/ DOES NOT KNOW IF SHE NEEDS TO USE C - PAP    Past Surgical History  Procedure Laterality Date  . Endometrial ablation w/ novasure    . Carpal tunnel release  2007    both hands  . Trigger finger release surgeyr       bilateral   . Tubal ligation    . Knee arthroscopy      let knee 03/2013   . Knee arthroscopy with medial menisectomy Right 10/22/2013    Procedure: RIGHT KNEE ARTHROSCOPY WITH MEDIAL MENISECTOMY, abrasion chrondrplasty Brigid Re OUT/CORRECT CURETTEMENT/BONE GRAFT/PROXIMAL TIBIA ;  Surgeon: Jacki Cones, MD;  Location: [facility];  Service: Orthopedics;  Laterality: Right;  . Back surgery  2012    lumb fusion  . Colonoscopy    . Excision metacarpal mass Right 03/10/2014    Procedure: EXCISION MASS RIGHT HAND FIRST WEB SPACE DORSAL PALMAR INCISION ;  Surgeon:  Cindee Salt, MD;  Location: [facility];  Service: Orthopedics;  Laterality: Right;  . Total knee arthroplasty Left 04/16/2014    Procedure: LEFT TOTAL KNEE ARTHROPLASTY;  Surgeon: Jacki Cones, MD;  Location: [facility];  Service: Orthopedics;  Laterality: Left;    Family History  Problem Relation Age of Onset  . Alcohol abuse Mother   . Arthritis Mother   . Hyperlipidemia Father   . Hypertension Father   . Heart disease Father 86    CABG  . Hypertension Sister   . Cancer Brother     colon, lung, liver  . Cancer Maternal Grandmother     breast  . Cancer Daughter     NEUROBLASTOMA   Social History:  reports that she has never smoked. She has never used smokeless tobacco. She reports that she does not drink alcohol or use illicit drugs.  Allergies:  Allergies  Allergen Reactions  . Adhesive [Tape] Other (See Comments)    Peeled off skin  . Ceclor [Cefaclor] Other (See Comments)    Blisters on hands  . Chocolate Other (See Comments)    migraines  . Venlafaxine Other (See Comments)    "refuses to take - makes me feel like I  am dying"     Current outpatient prescriptions: albuterol (PROVENTIL HFA;VENTOLIN HFA) 108 (90 BASE) MCG/ACT inhaler, Inhale 2 puffs into the lungs every 6 (six) hours as needed for wheezing., Disp: 8.5 g, Rfl: 5;   ALPRAZolam (XANAX) 0.5 MG tablet, Take 0.5 mg by mouth 2 (two) times daily., Disp: , Rfl: ;   amLODipine (NORVASC) 10 MG tablet, Take 10 mg by mouth every morning., Disp: , Rfl:  budesonide-formoterol (SYMBICORT) 160-4.5 MCG/ACT inhaler, Inhale 1 puff into the lungs 2 (two) times daily. , Disp: , Rfl: ;   ferrous sulfate 325 (65 FE) MG tablet, Take 1 tablet (325 mg total) by mouth 3 (three) times daily after meals., Disp: 30 tablet, Rfl: 0;   furosemide (LASIX) 20 MG tablet, Take 20 mg by mouth every morning., Disp: , Rfl: ;   lisinopril (PRINIVIL,ZESTRIL) 20 MG tablet, Take 20 mg by mouth every morning., Disp: , Rfl:  lithium  carbonate 300 MG capsule, Take 300 mg by mouth at bedtime., Disp: , Rfl: ;   methocarbamol (ROBAXIN) 500 MG tablet, Take 1 tablet (500 mg total) by mouth every 6 (six) hours as needed for muscle spasms., Disp: 40 tablet, Rfl: 1;   montelukast (SINGULAIR) 10 MG tablet, Take 10 mg by mouth at bedtime., Disp: , Rfl: ;   montelukast (SINGULAIR) 10 MG tablet, TAKE 1 TABLET (10 MG TOTAL) BY MOUTH AT BEDTIME., Disp: 30 tablet, Rfl: 5 oxyCODONE (OXY IR/ROXICODONE) 5 MG immediate release tablet, Take 1-3 tablets (5-15 mg total) by mouth every 3 (three) hours as needed for severe pain., Disp: 90 tablet, Rfl: 0;   Polyvinyl Alcohol-Povidone (REFRESH OP), Place 2 drops into both eyes daily as needed (For dry eyes.). , Disp: , Rfl: ;   pravastatin (PRAVACHOL) 40 MG tablet, Take 40 mg by mouth at bedtime., Disp: , Rfl:  promethazine (PHENERGAN) 25 MG tablet, Take 25 mg by mouth every 6 (six) hours as needed for nausea or vomiting. , Disp: , Rfl: ;   rivaroxaban (XARELTO) 10 MG TABS tablet, Take 1 tablet (10 mg total) by mouth daily with breakfast., Disp: 20 tablet, Rfl: 0;   sertraline (ZOLOFT) 100 MG tablet, Take 100 mg by mouth 2 (two) times daily. , Disp: , Rfl: ;   zolpidem (AMBIEN CR) 12.5 MG CR tablet, Take 12.5 mg by mouth at bedtime., Disp: , Rfl:    Review of Systems  Constitutional: Positive for malaise/fatigue. Negative for fever, chills, weight loss and diaphoresis.  HENT: Negative.   Eyes: Negative.   Respiratory: Negative.   Cardiovascular: Negative.   Gastrointestinal: Negative.   Genitourinary: Negative.   Musculoskeletal: Positive for joint pain. Negative for back pain, falls, myalgias and neck pain.       Left knee pain  Skin: Negative.   Neurological: Negative.  Negative for weakness.  Endo/Heme/Allergies: Negative.   Psychiatric/Behavioral: Negative.    Vitals  Weight: 292 lb Height: 65in Pulse: 76 (Regular)  BP: 128/78 (Sitting, Left Arm, Standard) Temp: 98.8  Physical  Exam  Constitutional: She is oriented to person, place, and time. She appears well-developed. No distress.  Morbidly obese  HENT:  Head: Normocephalic and atraumatic.  Right Ear: External ear normal.  Left Ear: External ear normal.  Nose: Nose normal.  Mouth/Throat: Oropharynx is clear and moist.  Eyes: Conjunctivae and EOM are normal.  Neck: Normal range of motion. Neck supple.  Cardiovascular: Normal rate, regular rhythm, normal heart sounds and intact distal pulses.   Respiratory: Effort normal and breath sounds  normal.  GI: Soft. Bowel sounds are normal.  Musculoskeletal:       Right hip: Normal.       Left hip: Normal.       Right knee: Normal.       Left knee: She exhibits decreased range of motion, swelling, effusion and erythema. Tenderness found. Medial joint line and lateral joint line tenderness noted.       Right lower leg: She exhibits no tenderness and no swelling.       Left lower leg: She exhibits swelling. She exhibits no tenderness.  History of left TKA. Proximal third of incision has three 1-2cm portion that have dehisced with yellow serous drainage and questionable pus drainage. Erythema surrounding these areas but no other erythema about the knee or leg.   Neurological: She is alert and oriented to person, place, and time. She has normal strength and normal reflexes. No sensory deficit.  Skin: She is not diaphoretic. There is erythema.  Psychiatric: She has a normal mood and affect. Her behavior is normal.     Assessment/Plan S/P left total knee arthroplasty Superficial infection secondary to suture reaction, left knee Allien needs to be taken to the OR for I&D of the left knee and reapproximation of the incision. Dr. Alvan Dame feels that this is likely limited to the superficial area but she would benefit from washing out the joint as well as a precaution. Depending on how the knee appears in the OR, she may need IV antibiotics for a period of time following the  procedure. May need ID consult depending on the nature of the knee.    Perseus Westall LAUREN 05/23/2014, 10:00 AM

## 2014-05-24 LAB — CBC
HCT: 31.3 % — ABNORMAL LOW (ref 36.0–46.0)
Hemoglobin: 10.3 g/dL — ABNORMAL LOW (ref 12.0–15.0)
MCH: 29.3 pg (ref 26.0–34.0)
MCHC: 32.9 g/dL (ref 30.0–36.0)
MCV: 89.2 fL (ref 78.0–100.0)
Platelets: 292 10*3/uL (ref 150–400)
RBC: 3.51 MIL/uL — ABNORMAL LOW (ref 3.87–5.11)
RDW: 14.4 % (ref 11.5–15.5)
WBC: 9.4 10*3/uL (ref 4.0–10.5)

## 2014-05-24 LAB — BASIC METABOLIC PANEL
Anion gap: 13 (ref 5–15)
BUN: 15 mg/dL (ref 6–23)
CO2: 24 mEq/L (ref 19–32)
Calcium: 9.1 mg/dL (ref 8.4–10.5)
Chloride: 98 mEq/L (ref 96–112)
Creatinine, Ser: 0.74 mg/dL (ref 0.50–1.10)
GFR calc Af Amer: >90 mL/min (ref >90)
GFR calc non Af Amer: >90 mL/min (ref >90)
Glucose, Bld: 162 mg/dL — ABNORMAL HIGH (ref 70–99)
Potassium: 4.4 mEq/L (ref 3.7–5.3)
Sodium: 135 mEq/L — ABNORMAL LOW (ref 137–147)

## 2014-05-24 MED ORDER — ASPIRIN EC 325 MG PO TBEC
325.0000 mg | DELAYED_RELEASE_TABLET | Freq: Two times a day (BID) | ORAL | Status: DC
  Administered 2014-05-24 – 2014-05-26 (×4): 325 mg via ORAL
  Filled 2014-05-24 (×6): qty 1

## 2014-05-24 MED ORDER — VANCOMYCIN HCL 10 G IV SOLR
1250.0000 mg | Freq: Two times a day (BID) | INTRAVENOUS | Status: DC
  Administered 2014-05-24 – 2014-05-26 (×5): 1250 mg via INTRAVENOUS
  Filled 2014-05-24 (×6): qty 1250

## 2014-05-24 MED ORDER — ASPIRIN 325 MG PO TABS: 325.0000 mg | ORAL_TABLET | Freq: Two times a day (BID) | ORAL | Status: DC

## 2014-05-24 NOTE — Progress Notes (Signed)
Physical Therapy Treatment Patient Details Name: [name] MRN: [id number] DOB: 01/16/1961 Today's Date: 05/24/2014    History of Present Illness I and D  of L TKA with poly exchange    PT Comments    Patient improving in mobility this PM.   Follow Up Recommendations  No PT follow up (due to now ROM currently)     Equipment Recommendations  None recommended by PT    Recommendations for Other Services Rehab consult     Precautions / Restrictions Precautions Precautions: Knee Precaution Comments: no A/P ROM L knee  Required Braces or Orthoses: Knee Immobilizer - Right;Knee Immobilizer - Left Knee Immobilizer - Right: On at all times    Mobility  Bed Mobility Overal bed mobility: Needs Assistance;+ 2 for safety/equipment Bed Mobility: Sit to Supine     Supine to sit: Mod assist;HOB elevated Sit to supine: Min assist   General bed mobility comments: support LLE onto bed.  Transfers Overall transfer level: Needs assistance Equipment used: Rolling walker (2 wheeled) Transfers: Sit to/from Stand Sit to Stand: Min assist Stand pivot transfers: Mod assist;+2 safety/equipment       General transfer comment: cues for WBAT,   Ambulation/Gait Ambulation/Gait assistance: Min assist Ambulation Distance (Feet): 5 Feet Assistive device: Rolling walker (2 wheeled)       General Gait Details: pt  [facility]   Stairs            Wheelchair Mobility    Modified Rankin (Stroke Patients Only)       Balance                                    Cognition Arousal/Alertness: Awake/alert Behavior During Therapy: WFL for tasks assessed/performed Overall Cognitive Status: Within Functional Limits for tasks assessed                      Exercises      General Comments        Pertinent Vitals/Pain Pain Assessment: 0-10 Pain Score: 6  Pain Location: L knee  Pain Descriptors /  Indicators: Aching;Cramping Pain Intervention(s): Premedicated before session;Ice applied    Home Living Family/patient expects to be discharged to:: Private residence Living Arrangements: Spouse/significant other Available Help at Discharge: Family Type of Home: House Home Access: Stairs to enter Entrance Stairs-Rails: None Home Layout: One level Home Equipment: Environmental consultantWalker - 2 wheels;Adaptive equipment;Bedside commode;Tub bench;Cane - quad      Prior Function Level of Independence: Independent with assistive device(s)          PT Goals (current goals can now be found in the care plan section) Acute Rehab PT Goals Patient Stated Goal: to get better, PT Goal Formulation: With patient Time For Goal Achievement: 05/31/14 Potential to Achieve Goals: Good Progress towards PT goals: Progressing toward goals    Frequency  7X/week    PT Plan Current plan remains appropriate    Co-evaluation             End of Session Equipment Utilized During Treatment: Left knee immobilizer Activity Tolerance: Patient tolerated treatment well Patient left: in bed;with call bell/phone within reach;with family/visitor present     Time: [id number] PT Time Calculation (min): 28 min  Charges:  $Gait Training: 8-22 mins $Therapeutic Activity: 8-22 mins $Self Care/Home Management: 8-22  G Codes:      [name], [name] 05/24/2014, 3:21 PM

## 2014-05-24 NOTE — Anesthesia Postprocedure Evaluation (Signed)
  Anesthesia Post-op Note  Patient: [name]  Procedure(s) Performed: Procedure(s) (LRB): IRRIGATION AND DEBRIDEMENT KNEE (Left)  Patient Location: PACU  Anesthesia Type: General  Level of Consciousness: awake and alert   Airway and Oxygen Therapy: Patient Spontanous Breathing  Post-op Pain: mild  Post-op Assessment: Post-op Vital signs reviewed, Patient's Cardiovascular Status Stable, Respiratory Function Stable, Patent Airway and No signs of Nausea or vomiting  Last Vitals:  Filed Vitals:   05/24/14 0555  BP: 151/77  Pulse: 80  Temp: 36.7 C  Resp: 16    Post-op Vital Signs: stable   Complications: No apparent anesthesia complications

## 2014-05-24 NOTE — Op Note (Signed)
NAMJaneice Robinson:  Michelli, MARY Taylinn           ACCOUNT NO.:  [id number]  MEDICAL RECORD NO.:  [id number]  LOCATION:  1608                         [facility]  PHYSICIAN:  Madlyn FrankelMatthew D. Charlann Boxerlin, M.D.  DATE OF BIRTH:  [birth year]  DATE OF PROCEDURE:  05/23/2014 DATE OF DISCHARGE:                              OPERATIVE REPORT   PREOPERATIVE DIAGNOSIS:  Superficial knee infection with nonhealing wound, status post total knee arthroplasty 5 weeks ago.  POSTOPERATIVE DIAGNOSIS:  Superficial knee infection with nonhealing wound, status post total knee arthroplasty 5 weeks ago.  PROCEDURES: 1. Sharp excisional debridement of skin, subcutaneous tissue,     nonviable fibrous tissue tendon with a scalpel over the entire 12-     inch incision. 2. Nonexcisional debridement with irrigation of the knee with 9 liters     of normal saline solution combined, superficial and deep. 3. Polyethylene exchange.  SURGEON:  Madlyn FrankelMatthew D. Charlann Boxerlin, M.D.  ASSISTANT:  Lanney GinsMatthew Babish, PA-C.  ANESTHESIA:  General.  SPECIMENS:  None.  COMPLICATIONS:  None.  DRAINS:  One medium Hemovac.  TOURNIQUET TIME:  57 minutes at 250 mmHg.  INDICATION FOR PROCEDURE:  Ms. Donnie Ahoilley is a 53 year old female with a left total knee arthroplasty.  Left total knee arthroplasty was performed about 5 weeks ago.  She had been seen and evaluated in the office recently with concerns for some wound drainage and had been placed on some oral antibiotics, which were not tolerated.  She presented to the office today with persistent drainage and no real fevers or chills.  I was asked to check her wound out and felt that it was in her best interest at this point to treat this more aggressively by washing this out, examining the knee and primary wound closure.  Risks, benefits and necessity of the procedure were discussed and reviewed and understood. Consent was obtained for benefit of wound healing.  PROCEDURE IN DETAIL:  The patient was brought to  the operative theater. Once adequate anesthesia was established, preoperative antibiotics including 900 mg of clindamycin as well as 1.5 g of vancomycin administered.  She was positioned supine with a left thigh tourniquet placed.  The left lower extremity was then prepped and draped in sterile fashion following a prescrub of the leg.  Once positioned, the time-out was performed identifying the patient, planned procedure, and extremity.  The left lower extremity was exsanguinated, tourniquet was elevated to 250 mmHg.  I planned an excisional debridement of the wound and marked this on the skin.  I then elliptically excised out the nonhealing portion of the wound proximally and incorporate this all the way distal and slightly more proximal. This nonviable skin tissue was excised sharply including the soft tissues underneath it.  Soft tissue planes were then recreated.  Finding what appeared to be more old hematoma type of material, no active purulence identified, I did not take cultures.  I debrided this layer, significantly removing all nonviable fibrous tissue.  Once I had identified this layer, I did irrigate it out with 3 liters of normal saline solution following this adequate debridement,  At this point, I opened up the knee removing old sutures.  Inside the knee, was clear synovial fluid,  no signs of purulence, thus, no deep infection.  Following exposure of the knee as well as debridement of this knee, I then irrigated this knee with 3 liters of normal saline solution. Following this, I removed the old polyethylene insert and placed a new one just to make certain I could debride posteriorly.  I then re-irrigated the knee with 3 liters of normal saline solution. At this point, both the superficial and deep tissues had a significantly better improved appearance.  At this point, I placed a medium Hemovac drain deep and then reapproximated the extensor mechanism using  interrupted #1 PDS sutures.  The skin was then closed with 2-0 Vicryl and 2-0 nylon sutures.  The tourniquet had been let down after 57 minutes.  The wound was then cleaned, dried and dressed sterilely with Xeroform and a bulky sterile wrap with an Ace wrap and her knee placed into a knee immobilizer. Please note that upon closure of the extensor mechanism, I did spread 1 g of vancomycin powder into the subcutaneous layer.  Following these procedures, the patient was brought to the recovery room, extubated in stable condition.  Findings were reviewed with family.  I will plan to have her admitted and a PICC line placed.  Based on her nonhealing wound, I think it could be in her best interest to have IV vancomycin administered for at least 2, if not 4 weeks until wound was completely healed and can be debrided.  In summary, the patient appeared to have nonhealing wound.  I did not see obvious infection in the superficial tissues; however, this would be the presumed diagnosis at this point.  There was no deep infection evident at all this early aggressive approach with closure of the wound. I would suggest that she try to keep her knee relatively stable for a week or so.  She already had good flexion identified in the operating room and had a bit of an extensor lag, so she can span this time to work on stretching her extensor capsule. After about a week ago, I would begin moving the knee to make sure she regains her flexion, but make certain, we get adequate healing of this wound in the interim.     Madlyn FrankelMatthew D. Charlann Boxerlin, M.D.     MDO/MEDQ  D:  05/23/2014  T:  05/24/2014  Job:  [id number]

## 2014-05-24 NOTE — Progress Notes (Signed)
OT Cancellation Note  Patient Details Name: [name] MRN: [id number] DOB: 29-Jul-1960   Cancelled Treatment:    Reason Eval/Treat Not Completed: OT screened, no needs identified, will sign off Pt has all needed equipment- no OT needs identified.  Pt did ask about when she would be able to shower- MD please address.  Alba CoryREDDING, Laurette Villescas D 05/24/2014, 12:31 PM

## 2014-05-24 NOTE — Progress Notes (Signed)
ANTIBIOTIC CONSULT NOTE - INITIAL  Pharmacy Consult for Vancomycin Indication: Wound infection  Allergies  Allergen Reactions  . Adhesive [Tape] Other (See Comments)    Peeled off skin  . Ceclor [Cefaclor] Other (See Comments)    Blisters on hands  . Chocolate Other (See Comments)    migraines  . Venlafaxine Other (See Comments)    "refuses to take - makes me feel like I am dying"    Patient Measurements: Height: 5\' 4"  (162.6 cm) Weight: 280 lb (127.007 kg) IBW/kg (Calculated) : 54.7 Adjusted Body Weight:   Vital Signs: Temp: 98.3 F (36.8 C) (10/31 0235) Temp Source: Oral (10/31 0235) BP: 125/60 mmHg (10/31 0235) Pulse Rate: 96 (10/31 0235) Intake/Output from previous day: 10/30 0701 - 10/31 0700 In: 1200 [I.V.:1200] Out: 30 [Drains:30] Intake/Output from this shift: Total I/O In: 1200 [I.V.:1200] Out: 30 [Drains:30]  Labs:  Recent Labs  05/23/14 1437  WBC 7.4  HGB 11.5*  PLT 291  CREATININE 0.73   Estimated Creatinine Clearance: 109.8 ml/min (by C-G formula based on Cr of 0.73). No results found for this basename: VANCOTROUGH, Leodis BinetVANCOPEAK, VANCORANDOM, GENTTROUGH, GENTPEAK, GENTRANDOM, TOBRATROUGH, TOBRAPEAK, TOBRARND, AMIKACINPEAK, AMIKACINTROU, AMIKACIN,  in the last 72 hours   Microbiology: Recent Results (from the past 720 hour(s))  SURGICAL PCR SCREEN     Status: None   Collection Time    05/23/14  2:59 PM      Result Value Ref Range Status   MRSA, PCR NEGATIVE  NEGATIVE Final   Staphylococcus aureus NEGATIVE  NEGATIVE Final   Comment:            The Xpert SA Assay (FDA     approved for NASAL specimens     in patients over 51 years of age),     is one component of     a comprehensive surveillance     program.  Test performance has     been validated by [facility] for patients greater     than or equal to 51 year old.     It is not intended     to diagnose infection nor to     guide or monitor treatment.    Medical History: Past  Medical History  Diagnosis Date  . Anxiety   . Depression   . Hypertension   . High cholesterol   . GERD (gastroesophageal reflux disease)   . Colon polyps   . Asthma   . PONV (postoperative nausea and vomiting)   . Arthritis   . Anemia     hx of with pregnancy   . Sleep apnea     has not use CPAP machine in 2 yrs/ DOES NOT KNOW IF SHE NEEDS TO USE C - PAP    Medications:  Anti-infectives   Start     Dose/Rate Route Frequency Ordered Stop   05/24/14 0200  clindamycin (CLEOCIN) IVPB 600 mg     600 mg 100 mL/hr over 30 Minutes Intravenous Every 6 hours 05/23/14 2341 05/24/14 1359   05/23/14 2113  vancomycin (VANCOCIN) powder  Status:  Discontinued       As needed 05/23/14 2113 05/23/14 2214   05/23/14 2015  clindamycin (CLEOCIN) IVPB 900 mg     900 mg 100 mL/hr over 30 Minutes Intravenous  Once 05/23/14 2010 05/23/14 2021   05/23/14 2015  vancomycin (VANCOCIN) 1,500 mg in sodium chloride 0.9 % 500 mL IVPB     1,500 mg 250 mL/hr over  120 Minutes Intravenous To Surgery 05/23/14 2014 05/23/14 2030     Assessment: Patient with wound infection.  First dose of antibiotics already given.   Goal of Therapy:  Vancomycin trough level 15-20 mcg/ml  Plan:  Measure antibiotic drug levels at steady state Follow up culture results Vancomycin 1250mg  iv q12hr  Darlina GuysGrimsley Jr, Jacquenette ShoneJulian Crowford 05/24/2014,3:52 AM

## 2014-05-24 NOTE — Progress Notes (Signed)
Subjective: 1 Day Post-Op Procedure(s) (LRB): IRRIGATION AND DEBRIDEMENT KNEE (Left) Patient reports pain as mild.  She reports feeling well this morning, and that her pain medications are working well to control her pain. She states that she feels much better compared to prior to surgery. Her appetite has returned and she is well hydrated. She reports + flatus. She denies any new HA, CP, SOB, N, V, fever, chills, calf pain or swelling. She has not been up with PT yet, but has been up to transfer to the potty chair and back.   Objective: Vital signs in last 24 hours: Temp:  [98.1 F (36.7 C)-98.9 F (37.2 C)] 98.1 F (36.7 C) (10/31 0555) Pulse Rate:  [80-99] 80 (10/31 0555) Resp:  [12-22] 22 (10/31 0735) BP: (124-184)/(60-96) 151/77 mmHg (10/31 0555) SpO2:  [96 %-100 %] 99 % (10/31 0735) Weight:  [127.007 kg (280 lb)] 127.007 kg (280 lb) (10/30 1415)  Intake/Output from previous day: 10/30 0701 - 10/31 0700 In: 1200 [I.V.:1200] Out: 535 [Urine:500; Drains:35] Intake/Output this shift:     Recent Labs  05/23/14 1437 05/24/14 0425  HGB 11.5* 10.3*    Recent Labs  05/23/14 1437 05/24/14 0425  WBC 7.4 9.4  RBC 4.06 3.51*  HCT 35.8* 31.3*  PLT 291 292    Recent Labs  05/23/14 1437 05/24/14 0425  NA 136* 135*  K 3.9 4.4  CL 98 98  CO2 23 24  BUN 13 15  CREATININE 0.73 0.74  GLUCOSE 96 162*  CALCIUM 9.5 9.1    Recent Labs  05/23/14 1437  INR 1.08   Physical Exam: WD/WN caucasian female in nad. A and O x4. Mood and affect appropriate. EOMI. Respirations normal and unlabored. Abdomen soft and non-tender. Soft dressing to L knee, in immobilizer. Drain in place. Dressings C/D/I. NV intact with brisk capillary refill and 5/5 strength of toe extensors and flexors bilaterally. Distal sensation intact bilaterally. No lymphadenopathy. No calf swelling or palpable cords.    Assessment/Plan: 1 Day Post-Op Procedure(s) (LRB): IRRIGATION AND DEBRIDEMENT KNEE (Left) Up  with therapy; Knee to remain in immobilizer; No A/PROM of knee for 1 weeks Continue with pain control PICC line to be placed today for IV ABX at DC Continue IV vancomycin per pharmacy  On ASA 325mg  for DVT prophylaxis Drain to be pulled on Sunday    [name] 05/24/2014, 8:12 AM ([phone]) [phone]

## 2014-05-24 NOTE — Evaluation (Addendum)
Physical Therapy Evaluation Patient Details Name: [name] MRN: [id number] DOB: 1961/02/17 Today's Date: 05/24/2014   History of Present Illness  I and D of L TKR with poly exchange  Clinical Impression  Patient tolerated transfer to recliner , limited weight on L leg tolerated. Patient will benefit from PT to address problems listed in note below.    Follow Up Recommendations No PT follow up    Equipment Recommendations  None recommended by PT    Recommendations for Other Services       Precautions / Restrictions Precautions Precautions: Knee Precaution Comments: no A/P ROM L knee  Required Braces or Orthoses: Knee Immobilizer - Right;Knee Immobilizer - Left Knee Immobilizer - Right: On at all times      Mobility  Bed Mobility Overal bed mobility: Needs Assistance;+ 2 for safety/equipment Bed Mobility: Supine to Sit     Supine to sit: Mod assist;HOB elevated     General bed mobility comments: extra time to work through pain, careful support  of LLE to slide to edge and to gloor  Transfers Overall transfer level: Needs assistance Equipment used: Rolling walker (2 wheeled) Transfers: Sit to/from [employer] CorporationStand;Stand Pivot Transfers Sit to Stand: Mod assist;+2 safety/equipment;From elevated surface Stand pivot transfers: Mod assist;+2 safety/equipment       General transfer comment: cues for WBAT  Ambulation/Gait                Stairs            Wheelchair Mobility    Modified Rankin (Stroke Patients Only)       Balance                                             Pertinent Vitals/Pain Pain Assessment: 0-10 Pain Score: 5  Pain Descriptors / Indicators: Aching;Constant;Cramping;Sharp Pain Intervention(s): Premedicated before session;Ice applied    Home Living Family/patient expects to be discharged to:: Private residence Living Arrangements: Spouse/significant other Available Help at Discharge: Family Type of  Home: House Home Access: Stairs to enter Entrance Stairs-Rails: None Entrance Stairs-Number of Steps: 2 Home Layout: One level Home Equipment: Environmental consultantWalker - 2 wheels;Adaptive equipment;Bedside commode;Tub bench;Cane - quad      Prior Function Level of Independence: Independent with assistive device(s)               Hand Dominance        Extremity/Trunk Assessment   Upper Extremity Assessment: Overall WFL for tasks assessed           Lower Extremity Assessment: LLE deficits/detail   LLE Deficits / Details: requires careful assist to lift  l;eg     Communication   Communication: No difficulties  Cognition Arousal/Alertness: Awake/alert Behavior During Therapy: WFL for tasks assessed/performed Overall Cognitive Status: Within Functional Limits for tasks assessed                      General Comments      Exercises        Assessment/Plan    PT Assessment Patient needs continued PT services  PT Diagnosis Difficulty walking;Acute pain   PT Problem List Decreased strength;Decreased activity tolerance;Decreased range of motion;Decreased mobility;Decreased knowledge of precautions;Decreased safety awareness;Decreased knowledge of use of DME;Pain  PT Treatment Interventions DME instruction;Gait training;Stair training;Functional mobility training;Therapeutic activities;Patient/family education   PT Goals (Current goals can be found in  the Care Plan section) Acute Rehab PT Goals Patient Stated Goal: to get better, PT Goal Formulation: With patient Time For Goal Achievement: 05/31/14 Potential to Achieve Goals: Good    Frequency     Barriers to discharge        Co-evaluation               End of Session Equipment Utilized During Treatment: Left knee immobilizer Activity Tolerance: Patient limited by pain Patient left: in chair;with call bell/phone within reach;with family/visitor present Nurse Communication: Mobility status         Time:  4098-11911137-1157 PT Time Calculation (min): 20 min   Charges:   PT Evaluation $Initial PT Evaluation Tier I: 1 Procedure PT Treatments $Therapeutic Activity: 8-22 mins   PT G Codes:          [name], Amoura Ransier Elizabeth 05/24/2014, 3:13 PM Blanchard KelchKaren Asjia Berrios PT [phone]

## 2014-05-24 NOTE — Progress Notes (Signed)
CARE MANAGEMENT NOTE 05/24/2014  Patient:  Macy MisLLEY,MARY Brittinie S   Account Number:  [id number]  Date Initiated:  05/24/2014  Documentation initiated by:  [facility] LPHAVIS,Demarian Epps  Subjective/Objective Assessment:   IRRIGATION AND DEBRIDEMENT KNEE     Action/Plan:   pt had [facility] in the past   Anticipated DC Date:  05/26/2014   Anticipated DC Plan:  HOME W HOME HEALTH SERVICES      DC Planning Services  CM consult      Choice offered to / List presented to:             Status of service:  In process, will continue to follow Medicare Important Message given?   (If response is "NO", the following Medicare IM given date fields will be blank) Date Medicare IM given:   Medicare IM given by:   Date Additional Medicare IM given:   Additional Medicare IM given by:    Discharge Disposition:    Per UR Regulation:    If discussed at Long Length of Stay Meetings, dates discussed:    Comments:  05/24/2014 1030 NCM spoke to pt and she has RW and 3n1 at home. [facility] Pt will have IV abx for 2-4 weeks in home after dc. [facility] Waiting final recommendations for home.  Isidoro DonningAlesia Jordon Bourquin RN CCM Case Mgmt phone [phone]

## 2014-05-25 LAB — BASIC METABOLIC PANEL
Anion gap: 12 (ref 5–15)
BUN: 10 mg/dL (ref 6–23)
CO2: 25 mEq/L (ref 19–32)
Calcium: 9.1 mg/dL (ref 8.4–10.5)
Chloride: 103 mEq/L (ref 96–112)
Creatinine, Ser: 0.68 mg/dL (ref 0.50–1.10)
GFR calc Af Amer: >90 mL/min (ref >90)
GFR calc non Af Amer: >90 mL/min (ref >90)
Glucose, Bld: 133 mg/dL — ABNORMAL HIGH (ref 70–99)
Potassium: 4.9 mEq/L (ref 3.7–5.3)
Sodium: 140 mEq/L (ref 137–147)

## 2014-05-25 LAB — CBC
HCT: 29.8 % — ABNORMAL LOW (ref 36.0–46.0)
Hemoglobin: 9.6 g/dL — ABNORMAL LOW (ref 12.0–15.0)
MCH: 28.7 pg (ref 26.0–34.0)
MCHC: 32.2 g/dL (ref 30.0–36.0)
MCV: 89.2 fL (ref 78.0–100.0)
Platelets: 286 10*3/uL (ref 150–400)
RBC: 3.34 MIL/uL — ABNORMAL LOW (ref 3.87–5.11)
RDW: 14.4 % (ref 11.5–15.5)
WBC: 10.3 10*3/uL (ref 4.0–10.5)

## 2014-05-25 MED ORDER — SODIUM CHLORIDE 0.9 % IJ SOLN
10.0000 mL | INTRAMUSCULAR | Status: DC | PRN
  Administered 2014-05-26: 10 mL
  Filled 2014-05-25: qty 40

## 2014-05-25 NOTE — Progress Notes (Signed)
Peripherally Inserted Central Catheter/Midline Placement  The IV Nurse has discussed with the patient and/or persons authorized to consent for the patient, the purpose of this procedure and the potential benefits and risks involved with this procedure.  The benefits include less needle sticks, lab draws from the catheter and patient may be discharged home with the catheter.  Risks include, but not limited to, infection, bleeding, blood clot (thrombus formation), and puncture of an artery; nerve damage and irregular heat beat.  Alternatives to this procedure were also discussed.  PICC/Midline Placement Documentation  PICC / Midline Single Lumen 05/25/14 PICC Right Cephalic 41 cm 0 cm (Active)  Indication for Insertion or Continuance of Line Home intravenous therapies (PICC only) 05/25/2014  1:05 PM  Exposed Catheter (cm) 0 cm 05/25/2014  1:05 PM  Site Assessment Clean;Dry;Intact 05/25/2014  1:05 PM  Line Status Flushed;Saline locked;Blood return noted 05/25/2014  1:05 PM  Dressing Type Transparent 05/25/2014  1:05 PM  Dressing Status Clean;Dry;Intact;Antimicrobial disc in place 05/25/2014  1:05 PM  Line Care Connections checked and tightened 05/25/2014  1:05 PM  Dressing Intervention New dressing 05/25/2014  1:05 PM  Dressing Change Due 06/01/14 05/25/2014  1:05 PM       Elliot Dallyiggs, Rosina Cressler Wright 05/25/2014, 1:06 PM

## 2014-05-25 NOTE — Progress Notes (Signed)
Subjective: Doing well.  Pain controlled.  Ambulating well with PT.   Objective: Vital signs in last 24 hours: Temp:  [97.7 F (36.5 C)-98.6 F (37 C)] 98.6 F (37 C) (10/31 2250) Pulse Rate:  [80-97] 97 (10/31 2250) Resp:  [16-20] 16 (10/31 2250) BP: (116-132)/(56-70) 122/56 mmHg (10/31 2250) SpO2:  [94 %-98 %] 94 % (11/01 0751)  Intake/Output from previous day: 10/31 0701 - 11/01 0700 In: 3204.7 [P.O.:1080; I.V.:1624.7; IV Piggyback:500] Out: 877 [Urine:850; Drains:27] Intake/Output this shift:     Recent Labs  05/23/14 1437 05/24/14 0425 05/25/14 0517  HGB 11.5* 10.3* 9.6*    Recent Labs  05/24/14 0425 05/25/14 0517  WBC 9.4 10.3  RBC 3.51* 3.34*  HCT 31.3* 29.8*  PLT 292 286    Recent Labs  05/24/14 0425 05/25/14 0517  NA 135* 140  K 4.4 4.9  CL 98 103  CO2 24 25  BUN 15 10  CREATININE 0.74 0.68  GLUCOSE 162* 133*  CALCIUM 9.1 9.1    Recent Labs  05/23/14 1437  INR 1.08    Exam:  Knee wound looks good.  No gross drainage.  Sutures intact.  Drain removed.  Calf NT and NVI.    Assessment/Plan: Continue present care.  Awaiting PICC line placement.  Dr Charlann Boxerolin to follow in AM.     [name],[name] 05/25/2014, 11:14 AM

## 2014-05-25 NOTE — Progress Notes (Addendum)
Physical Therapy Treatment Patient Details Name: [name] MRN: [id number] DOB: [birth year] Today's Date: 05/25/2014    History of Present Illness I and D  of L TKA with poly exchange    PT Comments    Patient reports that L knee is less painful today. Motivated to ambulate. Instructed patient and spouse on strict use of KI, positioning of it for stability and no ROM.  Follow Up Recommendations  No PT follow up (iuntil MD desires ROM)     Equipment Recommendations  None recommended by PT    Recommendations for Other Services       Precautions / Restrictions Precautions Precautions: Knee Precaution Comments: no A/P ROM L knee  Required Braces or Orthoses:;Knee Immobilizer - Left Knee Immobilizer - left: On at all times    Mobility  Bed Mobility Overal bed mobility: Needs Assistance Bed Mobility: Supine to Sit     Supine to sit: Mod assist     General bed mobility comments: support LLE  moving across bed  and lower to floor  Transfers Overall transfer level: Needs assistance Equipment used: Rolling walker (2 wheeled) Transfers: Sit to/from Stand Sit to Stand: Min guard         General transfer comment: cues for hand placement, LLE position  Ambulation/Gait Ambulation/Gait assistance: Min guard Ambulation Distance (Feet): 20 Feet (then100') Assistive device: Rolling walker (2 wheeled)       General Gait Details: cues for sequence   Stairs            Wheelchair Mobility    Modified Rankin (Stroke Patients Only)       Balance                                    Cognition Arousal/Alertness: Awake/alert                          Exercises      General Comments        Pertinent Vitals/Pain Pain Score: 3  Pain Location: L knee Pain Descriptors / Indicators: Aching Pain Intervention(s): Premedicated before session;Ice applied    Home Living                      Prior Function             PT Goals (current goals can now be found in the care plan section) Progress towards PT goals: Progressing toward goals    Frequency  7X/week    PT Plan Current plan remains appropriate    Co-evaluation             End of Session Equipment Utilized During Treatment: Left knee immobilizer Activity Tolerance: Patient tolerated treatment well Patient left: in chair;with call bell/phone within reach;with family/visitor present     Time: 4098-11910935-1011 PT Time Calculation (min): 36 min  Charges:  $Gait Training: 23-37 mins                    G Codes:      [name], [name] [name] 05/25/2014, 12:54 PM Blanchard KelchKaren Marquise [name] PT [phone]

## 2014-05-25 NOTE — Progress Notes (Signed)
Physical Therapy Treatment Patient Details Name: [name] MRN: [id number] DOB: 1961-03-27 Today's Date: 05/25/2014    History of Present Illness I and D  of L TKA with poly exchange    PT Comments    continues to improve in mobility and gait distance.  Follow Up Recommendations  No PT follow up (iuntil MD desires ROM)     Equipment Recommendations  None recommended by PT    Recommendations for Other Services       Precautions / Restrictions Precautions Precautions: Knee Precaution Comments: no A/P ROM L knee  Required Braces or Orthoses:t;Knee Immobilizer - Left Knee Immobilizer - L: On at all times    Mobility  Bed Mobility Overal bed mobility: Needs Assistance Bed Mobility: Supine to Sit     Supine to sit: Mod assist Sit to supine: Min assist   General bed mobility comments: assist with LLE onto bed.  Transfers Overall transfer level: Needs assistance Equipment used: Rolling walker (2 wheeled) Transfers: Sit to/from Stand Sit to Stand: Supervision         General transfer comment: cues for hand placement, LLE position  Ambulation/Gait Ambulation/Gait assistance: Min guard Ambulation Distance (Feet): 200 Feet Assistive device: Rolling walker (2 wheeled) Gait Pattern/deviations: Step-to pattern;Step-through pattern;Antalgic;Decreased stance time - left     General Gait Details: cues for sequence   Stairs            Wheelchair Mobility    Modified Rankin (Stroke Patients Only)       Balance                                    Cognition Arousal/Alertness: Awake/alert                          Exercises      General Comments        Pertinent Vitals/Pain Pain Score: 3  Pain Location: L knee Pain Descriptors / Indicators: Aching Pain Intervention(s): Premedicated before session;Ice applied    Home Living                      Prior Function            PT Goals (current goals can  now be found in the care plan section) Progress towards PT goals: Progressing toward goals    Frequency  7X/week    PT Plan Current plan remains appropriate    Co-evaluation             End of Session Equipment Utilized During Treatment: Left knee immobilizer Activity Tolerance: Patient tolerated treatment well Patient left: in chair;with call bell/phone within reach;with family/visitor present     Time: 4098-11911139-1159 PT Time Calculation (min): 20 min  Charges:  $Gait Training: 8-22 mins                    G Codes:      [name], [name] [name] 05/25/2014, 12:59 PM

## 2014-05-26 ENCOUNTER — Encounter (HOSPITAL_COMMUNITY): Payer: Self-pay | Admitting: Orthopedic Surgery

## 2014-05-26 LAB — VANCOMYCIN, TROUGH: Vancomycin Tr: 14.2 ug/mL (ref 10.0–20.0)

## 2014-05-26 MED ORDER — VANCOMYCIN HCL 10 G IV SOLR: 1250.0000 mg | Freq: Two times a day (BID) | INTRAVENOUS | Status: DC

## 2014-05-26 MED ORDER — CIPROFLOXACIN HCL 500 MG PO TABS: 500.0000 mg | ORAL_TABLET | Freq: Two times a day (BID) | ORAL | Status: DC

## 2014-05-26 MED ORDER — ASPIRIN 325 MG PO TBEC: 325.0000 mg | DELAYED_RELEASE_TABLET | Freq: Two times a day (BID) | ORAL | Status: DC

## 2014-05-26 MED ORDER — DSS 100 MG PO CAPS: 100.0000 mg | ORAL_CAPSULE | Freq: Two times a day (BID) | ORAL | Status: DC

## 2014-05-26 MED ORDER — OXYCODONE HCL 5 MG PO TABS: 5.0000 mg | ORAL_TABLET | ORAL | Status: DC | PRN

## 2014-05-26 MED ORDER — METHOCARBAMOL 500 MG PO TABS: 500.0000 mg | ORAL_TABLET | Freq: Four times a day (QID) | ORAL | Status: DC | PRN

## 2014-05-26 MED ORDER — HEPARIN SOD (PORK) LOCK FLUSH 100 UNIT/ML IV SOLN
250.0000 [IU] | INTRAVENOUS | Status: AC | PRN
Start: 2014-05-26 — End: 2014-05-26
  Administered 2014-05-26: 250 [IU]

## 2014-05-26 NOTE — Progress Notes (Signed)
Physical Therapy Treatment Patient Details Name: [name] MRN: [id number] DOB: 1961/02/03 Today's Date: 05/26/2014    History of Present Illness I and D  of L TKA with poly exchange    PT Comments    Pt progressing, reports feeling down today.  Follow Up Recommendations   (until MD allows ROM.)     Equipment Recommendations  None recommended by PT    Recommendations for Other Services       Precautions / Restrictions Precautions Precautions: Knee Precaution Comments: no A/P ROM L knee  Required Braces or Orthoses: Knee Immobilizer - Left Knee Immobilizer - Left: On at all times    Mobility  Bed Mobility   Bed Mobility: Supine to Sit     Supine to sit: Min assist     General bed mobility comments: assist with LLE  Transfers   Equipment used: Rolling walker (2 wheeled) Transfers: Sit to/from Stand Sit to Stand: Supervision            Ambulation/Gait Ambulation/Gait assistance: Min guard Ambulation Distance (Feet): 80 Feet Assistive device: Rolling walker (2 wheeled) Gait Pattern/deviations: Step-to pattern;Step-through pattern     General Gait Details: cues for sequence   Stairs Stairs: Yes Stairs assistance: Min assist Stair Management: With walker;Backwards;No rails Number of Stairs: 2 General stair comments: pt recalled technique  Wheelchair Mobility    Modified Rankin (Stroke Patients Only)       Balance                                    Cognition Arousal/Alertness: Awake/alert                          Exercises      General Comments        Pertinent Vitals/Pain Pain Score: 3  Pain Location: L knee, feels more mentally upset today. Pain Descriptors / Indicators: Discomfort    Home Living                      Prior Function            PT Goals (current goals can now be found in the care plan section) Progress towards PT goals: Progressing toward goals    Frequency  7X/week    PT Plan Current plan remains appropriate    Co-evaluation             End of Session Equipment Utilized During Treatment: Left knee immobilizer Activity Tolerance: Patient tolerated treatment well Patient left: in chair;with call bell/phone within reach     Time: 7846-96290844-0903 PT Time Calculation (min): 19 min  Charges:  $Gait Training: 8-22 mins                    G Codes:      [name], [name] 05/26/2014, 12:10 PM Blanchard KelchKaren Hobie Kohles PT [phone]

## 2014-05-26 NOTE — Progress Notes (Signed)
ANTIBIOTIC CONSULT NOTE - Follow-up  Pharmacy Consult for Vancomycin Indication: Wound infection  Allergies  Allergen Reactions  . Adhesive [Tape] Other (See Comments)    Peeled off skin  . Ceclor [Cefaclor] Other (See Comments)    Blisters on hands  . Chocolate Other (See Comments)    migraines  . Venlafaxine Other (See Comments)    "refuses to take - makes me feel like I am dying"    Patient Measurements: Height: 5\' 4"  (162.6 cm) Weight: 280 lb (127.007 kg) IBW/kg (Calculated) : 54.7   Vital Signs: Temp: 98.7 F (37.1 C) (11/02 0505) Temp Source: Oral (11/02 0505) BP: 124/68 mmHg (11/02 0505) Pulse Rate: 72 (11/02 0505) Intake/Output from previous day: 11/01 0701 - 11/02 0700 In: 2055 [P.O.:1080; I.V.:475; IV Piggyback:500] Out: 1100 [Urine:1100] Intake/Output from this shift:    Labs:  Recent Labs  05/23/14 1437 05/24/14 0425 05/25/14 0517  WBC 7.4 9.4 10.3  HGB 11.5* 10.3* 9.6*  PLT 291 292 286  CREATININE 0.73 0.74 0.68   Estimated Creatinine Clearance: 109.8 mL/min (by C-G formula based on Cr of 0.68).  Recent Labs  05/26/14 0655  VANCOTROUGH 14.2     Microbiology: Recent Results (from the past 720 hour(s))  Surgical pcr screen     Status: None   Collection Time: 05/23/14  2:59 PM  Result Value Ref Range Status   MRSA, PCR NEGATIVE NEGATIVE Final   Staphylococcus aureus NEGATIVE NEGATIVE Final    Comment:        The Xpert SA Assay (FDA approved for NASAL specimens in patients over 721 years of age), is one component of a comprehensive surveillance program.  Test performance has been validated by [facility] for patients greater than or equal to 701 year old. It is not intended to diagnose infection nor to guide or monitor treatment.    Medical History: Past Medical History  Diagnosis Date  . Anxiety   . Depression   . Hypertension   . High cholesterol   . GERD (gastroesophageal reflux disease)   . Colon polyps   . Asthma   .  PONV (postoperative nausea and vomiting)   . Arthritis   . Anemia     hx of with pregnancy   . Sleep apnea     has not use CPAP machine in 2 yrs/ DOES NOT KNOW IF SHE NEEDS TO USE C - PAP   Assessment: 53 y/o F with L TKA 5 weeks ago, wound infection with drainage, dehiscence.  Patient was treated with Keflex initially (GI upset), changed to Doxycyline.  Drainage progressively worsened, so cultures were taken in the office.  Per H&P, culture showed E.coli (no record in EPIC). Underwent I&D with poly exchange 10/31.  10/31 >> Vancomycin >> 10/31 >> Clindamycin >> 10/31 pre & post I&D only 11/1 PICC placed  Tmax: 99.1 WBCs: WNL Renal: WNL, CrCl > 100 mL/min  PCR screen negative for MRSA  Goal of Therapy:  Vancomycin trough level 15-20 mcg/ml  Plan:  1.  Vancomycin trough level acceptable, as expect level will rise given obesity, extended duration of therapy.  Continue same regimen for now. 2.  Would recommend repeat Vancomycin trough level and renal function monitoring q3-5 days while on Vancomycin therapy. 3.  MD note indicates wound culture from office showed E.coli.  Vancomycin does not have gram negative activity (gram positive coverage only).  MRSA PCR negative.  Noted Cipro added on discharge with plans of 2-4 weeks of antibiotic therapy.  Greer PickerelJigna Dravyn Severs, PharmD, BCPS Pager: [phone] 05/26/2014 2:36 PM

## 2014-05-26 NOTE — Discharge Instructions (Signed)
Weightbearing as tolerated. Continue use of knee immobilizer. Walk with walker. You may walk with knee immobilizer on as tolerated. Do not resume physical therapy until after first postoperative appointment. Change your dressing daily. Shower only, no tub bath. Call if any temperatures greater than 101 or any wound complications: [phone] during the day and ask for Dr. Jeannetta EllisGioffre's nurse, Mackey Birchwoodammy Johnson.

## 2014-05-26 NOTE — Progress Notes (Signed)
Patient ID: Macy MisMary Tyleah S Duhart, female   DOB: 1960-08-13, 53 y.o.   MRN: [id number] Subjective: 3 Days Post-Op Procedure(s) (LRB): IRRIGATION AND DEBRIDEMENT KNEE (Left)    Patient reports pain as mild.  Doing fairly well, no major events.  [facility] but otherwise [facility]  Objective:   VITALS:   Filed Vitals:   05/26/14 0929  BP: 130/67  Pulse:   Temp:   Resp:     Neurovascular intact Incision: scant drainage  LABS  Recent Labs  05/23/14 1437 05/24/14 0425 05/25/14 0517  HGB 11.5* 10.3* 9.6*  HCT 35.8* 31.3* 29.8*  WBC 7.4 9.4 10.3  PLT 291 292 286     Recent Labs  05/23/14 1437 05/24/14 0425 05/25/14 0517  NA 136* 135* 140  K 3.9 4.4 4.9  BUN 13 15 10   CREATININE 0.73 0.74 0.68  GLUCOSE 96 162* 133*     Recent Labs  05/23/14 1437  INR 1.08     Assessment/Plan: 3 Days Post-Op Procedure(s) (LRB): IRRIGATION AND DEBRIDEMENT KNEE (Left)   Up with therapy Discharge home with home health today with plans for 2-4 weeks of IV vanco +/- CIPRO based on office culture swap for 2 weeks  Follow up with Gioffre in 1 week

## 2014-05-27 ENCOUNTER — Other Ambulatory Visit: Payer: Self-pay | Admitting: Family Medicine

## 2014-06-03 NOTE — Discharge Summary (Signed)
Physician Discharge Summary  Patient ID: [name] MRN: [id number] DOB/AGE: 1961-01-29 53 y.o.  Admit date: 05/23/2014 Discharge date: 05/26/2014   Procedures:  Procedure(s) (LRB): IRRIGATION AND DEBRIDEMENT KNEE (Left)  Attending Physician:  Dr. Paralee [name]   Admission Diagnoses:   Left knee draiange  Discharge Diagnoses:  Active Problems:   Infection of total left knee replacement  Past Medical History  Diagnosis Date  . Anxiety   . Depression   . Hypertension   . High cholesterol   . GERD (gastroesophageal reflux disease)   . Colon polyps   . Asthma   . PONV (postoperative nausea and vomiting)   . Arthritis   . Anemia     hx of with pregnancy   . Sleep apnea     has not use CPAP machine in 2 yrs/ DOES NOT KNOW IF SHE NEEDS TO USE C - PAP    HPI:      [name] has a history of a left total knee arthroplasty 5 weeks ago. She initially did well but developed some drainage from her incision over the past 10 days. She denies fever and chills. She has been placed on Keflex which caused severe nausea and vomiting. She has since been placed on Doxycycline. She has seen progressively worsening drainage and dehiscence over the incision. Cultures taken in the office on Wednesday May 21, 2014 showed e.coli. Blood work from the same day shows elevated ESR and CRP but no increase in WBC. However, she was on antibiotics at the time the blood work was taken. Dr. Alvan [name] consulted on the patient in the office and felt that she should be taken to the OR for I&D of the left total knee.   PCP: Morgan Post, MD   Discharged Condition: good  Hospital Course:  Patient underwent the above stated procedure on 05/23/2014. Patient tolerated the procedure well and brought to the recovery room in good condition and subsequently to the floor.  POD #1 BP: 151/77 ; Pulse: 80 ; Temp: 98.1 F (36.7 C) ; Resp: 22 Patient reports pain as mild. She reports feeling well this morning,  and that her pain medications are working well to control her pain. She states that she feels much better compared to prior to surgery. Her appetite has returned and she is well hydrated. She reports + flatus. She denies any new HA, CP, SOB, N, V, fever, chills, calf pain or swelling. She has not been up with PT yet, but has been up to transfer to the potty chair and back.  WD/WN caucasian female in nad. A and O x4. Mood and affect appropriate. EOMI. Respirations normal and unlabored. Abdomen soft and non-tender. Soft dressing to L knee, in immobilizer. Drain in place. Dressings C/D/I. NV intact with brisk capillary refill and 5/5 strength of toe extensors and flexors bilaterally. Distal sensation intact bilaterally. No lymphadenopathy. No calf swelling or palpable cords.   LABS  Basename    HGB  10.3  HCT  31.3   POD #2  BP: 151/77 ; Pulse: 80 ; Temp: 98.1 F (36.7 C) ; Resp: 22 Doing well. Pain controlled. Ambulating well with PT.  Knee wound looks good. No gross drainage. Sutures intact. Drain removed. Calf NT and NVI.   LABS  Basename    HGB  9.6  HCT  29.8   POD #3  Patient reports pain as mild. Doing fairly well, no major events. A bit worried about the [facility] line but otherwise OK  Dorsiflexion/plantar flexion intact, incision: dressing C/D/I, no cellulitis present and compartment soft.   LABS   No new labs   Discharge Exam: General appearance: alert, cooperative and no distress Extremities: Homans sign is negative, no sign of DVT, no edema, redness or tenderness in the calves or thighs and no ulcers, gangrene or trophic changes  Disposition: Home with follow up in 2 weeks   Follow-up Information    Follow up with Morgan Bastos, MD On 05/29/2014.   Specialty:  Orthopedic Surgery   Contact information:   [address] [place] [address] [phone]       Discharge Instructions    Call MD / Call 911    Complete by:  As directed   If you  experience chest pain or shortness of breath, CALL 911 and be transported to the hospital emergency room.  If you develope a fever above 101 F, pus (white drainage) or increased drainage or redness at the wound, or calf pain, call your surgeon's office.     Constipation Prevention    Complete by:  As directed   Drink plenty of fluids.  Prune juice may be helpful.  You may use a stool softener, such as Colace (over the counter) 100 mg twice a day.  Use MiraLax (over the counter) for constipation as needed.     Diet - low sodium heart healthy    Complete by:  As directed      Discharge instructions    Complete by:  As directed   Weightbearing as tolerated. Continue use of knee immobilizer. Walk with walker. You may walk with knee immobilizer on as tolerated. Do not resume physical therapy until after first postoperative appointment. Change your dressing daily. Shower only, no tub bath. Call if any temperatures greater than 101 or any wound complications: [phone] during the day and ask for Dr. Charlestine [name] nurse, [name].     Increase activity slowly as tolerated    Complete by:  As directed              Medication List    STOP taking these medications        aspirin 325 MG tablet  Replaced by:  aspirin 325 MG EC tablet      TAKE these medications        acetaminophen 500 MG tablet  Commonly known as:  TYLENOL  Take 1,000 mg by mouth every 6 (six) hours as needed for mild pain or moderate pain.     albuterol 108 (90 BASE) MCG/ACT inhaler  Commonly known as:  PROVENTIL HFA;VENTOLIN HFA  Inhale 2 puffs into the lungs every 6 (six) hours as needed for wheezing.     ALPRAZolam 0.5 MG tablet  Commonly known as:  XANAX  Take 0.5 mg by mouth 2 (two) times daily.     amLODipine 10 MG tablet  Commonly known as:  NORVASC  Take 10 mg by mouth every morning.     aspirin 325 MG EC tablet  Take 1 tablet (325 mg total) by mouth every 12 (twelve) hours.     budesonide-formoterol  160-4.5 MCG/ACT inhaler  Commonly known as:  SYMBICORT  Inhale 1 puff into the lungs 2 (two) times daily.     ciprofloxacin 500 MG tablet  Commonly known as:  CIPRO  Take 1 tablet (500 mg total) by mouth 2 (two) times daily.     DSS 100 MG Caps  Take 100 mg by mouth 2 (two) times daily.  furosemide 20 MG tablet  Commonly known as:  LASIX  Take 20 mg by mouth every morning.     lisinopril 20 MG tablet  Commonly known as:  PRINIVIL,ZESTRIL  Take 20 mg by mouth every morning.     lithium carbonate 300 MG capsule  Take 300 mg by mouth at bedtime.     methocarbamol 500 MG tablet  Commonly known as:  ROBAXIN  Take 1 tablet (500 mg total) by mouth every 6 (six) hours as needed for muscle spasms.     montelukast 10 MG tablet  Commonly known as:  SINGULAIR  Take 10 mg by mouth at bedtime.     oxyCODONE 5 MG immediate release tablet  Commonly known as:  Oxy IR/ROXICODONE  Take 1-3 tablets (5-15 mg total) by mouth every 3 (three) hours as needed for severe pain.     pravastatin 40 MG tablet  Commonly known as:  PRAVACHOL  Take 40 mg by mouth at bedtime.     promethazine 25 MG tablet  Commonly known as:  PHENERGAN  Take 25 mg by mouth every 6 (six) hours as needed for nausea or vomiting.     REFRESH OP  Place 2 drops into both eyes daily as needed (For dry eyes.).     sertraline 100 MG tablet  Commonly known as:  ZOLOFT  Take 100 mg by mouth 2 (two) times daily.     vancomycin 1,250 mg in sodium chloride 0.9 % 250 mL  Inject 1,250 mg into the vein every 12 (twelve) hours.     zolpidem 12.5 MG CR tablet  Commonly known as:  AMBIEN CR  Take 12.5 mg by mouth at bedtime.         Signed: [place] Pugh. Quintavious Rinck   PA-C  06/03/2014, 11:06 PM

## 2014-06-30 ENCOUNTER — Emergency Department (HOSPITAL_COMMUNITY)
Admission: EM | Admit: 2014-06-30 | Discharge: 2014-07-01 | Disposition: A | Discharge status: Home / Self Care | Payer: BC Managed Care – PPO | Attending: Emergency Medicine | Admitting: Emergency Medicine

## 2014-06-30 ENCOUNTER — Encounter (HOSPITAL_COMMUNITY): Payer: Self-pay | Admitting: Emergency Medicine

## 2014-06-30 ENCOUNTER — Emergency Department (HOSPITAL_COMMUNITY): Payer: BC Managed Care – PPO

## 2014-06-30 DIAGNOSIS — Z862 Personal history of diseases of the blood and blood-forming organs and certain disorders involving the immune mechanism: Secondary | ICD-10-CM | POA: Insufficient documentation

## 2014-06-30 DIAGNOSIS — S299XXA Unspecified injury of thorax, initial encounter: Secondary | ICD-10-CM | POA: Diagnosis not present

## 2014-06-30 DIAGNOSIS — Z8719 Personal history of other diseases of the digestive system: Secondary | ICD-10-CM | POA: Insufficient documentation

## 2014-06-30 DIAGNOSIS — M1990 Unspecified osteoarthritis, unspecified site: Secondary | ICD-10-CM | POA: Insufficient documentation

## 2014-06-30 DIAGNOSIS — Y998 Other external cause status: Secondary | ICD-10-CM | POA: Diagnosis not present

## 2014-06-30 DIAGNOSIS — F329 Major depressive disorder, single episode, unspecified: Secondary | ICD-10-CM | POA: Insufficient documentation

## 2014-06-30 DIAGNOSIS — Z7951 Long term (current) use of inhaled steroids: Secondary | ICD-10-CM | POA: Diagnosis not present

## 2014-06-30 DIAGNOSIS — S8992XA Unspecified injury of left lower leg, initial encounter: Secondary | ICD-10-CM | POA: Diagnosis not present

## 2014-06-30 DIAGNOSIS — Z86010 Personal history of colonic polyps: Secondary | ICD-10-CM | POA: Diagnosis not present

## 2014-06-30 DIAGNOSIS — Z8669 Personal history of other diseases of the nervous system and sense organs: Secondary | ICD-10-CM | POA: Insufficient documentation

## 2014-06-30 DIAGNOSIS — E780 Pure hypercholesterolemia: Secondary | ICD-10-CM | POA: Diagnosis not present

## 2014-06-30 DIAGNOSIS — J45909 Unspecified asthma, uncomplicated: Secondary | ICD-10-CM | POA: Insufficient documentation

## 2014-06-30 DIAGNOSIS — S8991XA Unspecified injury of right lower leg, initial encounter: Secondary | ICD-10-CM | POA: Insufficient documentation

## 2014-06-30 DIAGNOSIS — Y9389 Activity, other specified: Secondary | ICD-10-CM | POA: Insufficient documentation

## 2014-06-30 DIAGNOSIS — Z96652 Presence of left artificial knee joint: Secondary | ICD-10-CM | POA: Diagnosis not present

## 2014-06-30 DIAGNOSIS — Y92002 Bathroom of unspecified non-institutional (private) residence single-family (private) house as the place of occurrence of the external cause: Secondary | ICD-10-CM | POA: Diagnosis not present

## 2014-06-30 DIAGNOSIS — W07XXXA Fall from chair, initial encounter: Secondary | ICD-10-CM | POA: Diagnosis not present

## 2014-06-30 DIAGNOSIS — S3992XA Unspecified injury of lower back, initial encounter: Secondary | ICD-10-CM | POA: Diagnosis not present

## 2014-06-30 DIAGNOSIS — Z79899 Other long term (current) drug therapy: Secondary | ICD-10-CM | POA: Insufficient documentation

## 2014-06-30 DIAGNOSIS — F419 Anxiety disorder, unspecified: Secondary | ICD-10-CM | POA: Insufficient documentation

## 2014-06-30 DIAGNOSIS — Z792 Long term (current) use of antibiotics: Secondary | ICD-10-CM | POA: Diagnosis not present

## 2014-06-30 DIAGNOSIS — I10 Essential (primary) hypertension: Secondary | ICD-10-CM | POA: Insufficient documentation

## 2014-06-30 DIAGNOSIS — Z7982 Long term (current) use of aspirin: Secondary | ICD-10-CM | POA: Insufficient documentation

## 2014-06-30 DIAGNOSIS — W19XXXA Unspecified fall, initial encounter: Secondary | ICD-10-CM

## 2014-06-30 MED ORDER — MORPHINE SULFATE 4 MG/ML IJ SOLN
4.0000 mg | Freq: Once | INTRAMUSCULAR | Status: AC
  Administered 2014-06-30: 4 mg via INTRAMUSCULAR
  Filled 2014-06-30: qty 1

## 2014-06-30 MED ORDER — ONDANSETRON 4 MG PO TBDP
4.0000 mg | ORAL_TABLET | Freq: Once | ORAL | Status: AC
  Administered 2014-06-30: 4 mg via ORAL
  Filled 2014-06-30: qty 1

## 2014-06-30 MED ORDER — OXYCODONE-ACETAMINOPHEN 5-325 MG PO TABS: 1.0000 | ORAL_TABLET | Freq: Four times a day (QID) | ORAL | Status: DC | PRN

## 2014-06-30 NOTE — ED Provider Notes (Signed)
CSN: [id number]     Arrival date & time 06/30/14  1840 History   First MD Initiated Contact with Patient 06/30/14 2134     Chief Complaint  Patient presents with  . Fall  . Headache     (Consider location/radiation/quality/duration/timing/severity/associated sxs/prior Treatment) HPI   Patient to the ER after a fall that happened last night. She was in the shower sitting on her chair washing herself when she got her foot tangled in the chair and fell off. She fell out of the tube and landed on her back. She did not hit her head or have loc. She complaints or bruising to her breast, bilateral knee and low back pain. She walks with a walker at baseline due to complication with her left knee replacement. She is on IV vancomycin and has been for 6 weeks, she gets to discontinue it within the week. She has no complaints with her surgical site or picc line. She has not had fever, weakness, numbness, tingling, confusion or neck pain. Her husband is here with her and says she is at baseline. She has been taking Oxycodone for pain  Past Medical History  Diagnosis Date  . Anxiety   . Depression   . Hypertension   . High cholesterol   . GERD (gastroesophageal reflux disease)   . Colon polyps   . Asthma   . PONV (postoperative nausea and vomiting)   . Arthritis   . Anemia     hx of with pregnancy   . Sleep apnea     has not use CPAP machine in 2 yrs/ DOES NOT KNOW IF SHE NEEDS TO USE C - PAP   Past Surgical History  Procedure Laterality Date  . Endometrial ablation w/ novasure    . Carpal tunnel release  2007    both hands  . Trigger finger release surgeyr       bilateral   . Tubal ligation    . Knee arthroscopy      let knee 03/2013   . Knee arthroscopy with medial menisectomy Right 10/22/2013    Procedure: RIGHT KNEE ARTHROSCOPY WITH MEDIAL MENISECTOMY, abrasion chrondrplasty Brigid Re/CLEAN OUT/CORRECT CURETTEMENT/BONE GRAFT/PROXIMAL TIBIA ;  Surgeon: Jacki Conesonald A Gioffre, MD;  Location: [facility];   Service: Orthopedics;  Laterality: Right;  . Back surgery  2012    lumb fusion  . Colonoscopy    . Excision metacarpal mass Right 03/10/2014    Procedure: EXCISION MASS RIGHT HAND FIRST WEB SPACE DORSAL PALMAR INCISION ;  Surgeon: Cindee SaltGary Kuzma, MD;  Location: [facility];  Service: Orthopedics;  Laterality: Right;  . Total knee arthroplasty Left 04/16/2014    Procedure: LEFT TOTAL KNEE ARTHROPLASTY;  Surgeon: Jacki Conesonald A Gioffre, MD;  Location: [facility];  Service: Orthopedics;  Laterality: Left;  . Irrigation and debridement knee Left 05/23/2014    Procedure: IRRIGATION AND DEBRIDEMENT KNEE;  Surgeon: Shelda PalMatthew D Olin, MD;  Location: [facility];  Service: Orthopedics;  Laterality: Left;  With POLYETHYLENE EXCHANGE   Family History  Problem Relation Age of Onset  . Alcohol abuse Mother   . Arthritis Mother   . Hyperlipidemia Father   . Hypertension Father   . Heart disease Father 4475    CABG  . Hypertension Sister   . Cancer Brother     colon, lung, liver  . Cancer Maternal Grandmother     breast  . Cancer Daughter     NEUROBLASTOMA   History  Substance Use Topics  . Smoking status:  Never Smoker   . Smokeless tobacco: Never Used  . Alcohol Use: No   OB History    No data available     Review of Systems 10 Systems reviewed and are negative for acute change except as noted in the HPI.   Allergies  Adhesive; Ceclor; Chocolate; and Venlafaxine  Home Medications   Prior to Admission medications   Medication Sig Start Date End Date Taking? Authorizing Provider  acetaminophen (TYLENOL) 500 MG tablet Take 1,000 mg by mouth every 6 (six) hours as needed for mild pain or moderate pain.    Historical Provider, MD  albuterol (PROVENTIL HFA;VENTOLIN HFA) 108 (90 BASE) MCG/ACT inhaler Inhale 2 puffs into the lungs every 6 (six) hours as needed for wheezing. 02/07/14   Kristian Covey, MD  ALPRAZolam Prudy Feeler) 0.5 MG tablet Take 0.5 mg by mouth 2 (two) times daily.    Historical  Provider, MD  amLODipine (NORVASC) 10 MG tablet Take 10 mg by mouth every morning.    Historical Provider, MD  aspirin EC 325 MG EC tablet Take 1 tablet (325 mg total) by mouth every 12 (twelve) hours. 05/26/14   Amber Tamala Ser, PA-C  budesonide-formoterol (SYMBICORT) 160-4.5 MCG/ACT inhaler Inhale 1 puff into the lungs 2 (two) times daily.     Historical Provider, MD  ciprofloxacin (CIPRO) 500 MG tablet Take 1 tablet (500 mg total) by mouth 2 (two) times daily. 05/26/14   Amber Tamala Ser, PA-C  docusate sodium 100 MG CAPS Take 100 mg by mouth 2 (two) times daily. 05/26/14   Amber Tamala Ser, PA-C  furosemide (LASIX) 20 MG tablet Take 20 mg by mouth every morning.    Historical Provider, MD  lisinopril (PRINIVIL,ZESTRIL) 20 MG tablet Take 20 mg by mouth every morning.    Historical Provider, MD  lisinopril (PRINIVIL,ZESTRIL) 20 MG tablet TAKE 1 TABLET BY MOUTH EVERY DAY 05/27/14   Kristian Covey, MD  lithium carbonate 300 MG capsule Take 300 mg by mouth at bedtime.    Historical Provider, MD  methocarbamol (ROBAXIN) 500 MG tablet Take 1 tablet (500 mg total) by mouth every 6 (six) hours as needed for muscle spasms. 05/26/14   Amber Tamala Ser, PA-C  montelukast (SINGULAIR) 10 MG tablet Take 10 mg by mouth at bedtime.    Historical Provider, MD  oxyCODONE (OXY IR/ROXICODONE) 5 MG immediate release tablet Take 1-3 tablets (5-15 mg total) by mouth every 3 (three) hours as needed for severe pain. 05/26/14   Amber Tamala Ser, PA-C  oxyCODONE-acetaminophen (PERCOCET/ROXICET) 5-325 MG per tablet Take 1 tablet by mouth every 6 (six) hours as needed for severe pain. 06/30/14   Tajuanna Burnett Irine Seal, PA-C  Polyvinyl Alcohol-Povidone (REFRESH OP) Place 2 drops into both eyes daily as needed (For dry eyes.).     Historical Provider, MD  pravastatin (PRAVACHOL) 40 MG tablet Take 40 mg by mouth at bedtime.    Historical Provider, MD  promethazine (PHENERGAN) 25 MG tablet Take 25 mg by mouth  every 6 (six) hours as needed for nausea or vomiting.     Historical Provider, MD  sertraline (ZOLOFT) 100 MG tablet Take 100 mg by mouth 2 (two) times daily.     Historical Provider, MD  vancomycin 1,250 mg in sodium chloride 0.9 % 250 mL Inject 1,250 mg into the vein every 12 (twelve) hours. 05/26/14   Amber Tamala Ser, PA-C  zolpidem (AMBIEN CR) 12.5 MG CR tablet Take 12.5 mg by mouth at bedtime.    Historical  Provider, MD   BP 141/51 mmHg  Pulse 78  Temp(Src) 98.3 F (36.8 C) (Oral)  Resp 16  SpO2 100% Physical Exam  Constitutional: She appears well-developed and well-nourished. No distress.  HENT:  Head: Normocephalic and atraumatic.  Eyes: Pupils are equal, round, and reactive to light.  Neck: Normal range of motion. Neck supple.  Cardiovascular: Normal rate and regular rhythm.   Pulmonary/Chest: Effort normal.  Abdominal: Soft.  Musculoskeletal:       Legs: Pt has equal strength to bilateral lower extremities.  Neurosensory function adequate to both legs No clonus on dorsiflextion Skin color is normal. Skin is warm and moist.  I see no step off deformity, no midline bony tenderness.  Pt is able to ambulate with walkere (baseline)  No crepitus, laceration, effusion, induration, lesions, swelling.   Pedal pulses are symmetrical and palpable bilaterally  Mild  tenderness to palpation of paraspinel muscles   Neurological: She is alert.  Skin: Skin is warm and dry.  Nursing note and vitals reviewed.   ED Course  Procedures (including critical care time) Labs Review Labs Reviewed - No data to display DG Knee Complete 4 Views Left (Final result) Result time: 06/30/14 22:55:29   Final result by Rad Results In Interface (06/30/14 22:55:29)   Narrative:   CLINICAL DATA: BILATERAL knee pain post fall in a web bathtub today, LEFT knee surgery 3 months ago with followup knee surgery 1 month later due to infection, initial encounter  EXAM: LEFT KNEE - COMPLETE 4+  VIEW  COMPARISON: 11/23/2012  FINDINGS: Bones demineralized.  Components of LEFT knee prosthesis in expected positions.  No acute fracture, dislocation, or bone destruction.  No knee joint effusion.  IMPRESSION: LEFT knee prosthesis and osseous demineralization without acute bony abnormalities.   Electronically Signed By: Ulyses SouthwardMark Boles M.D. On: 06/30/2014 22:55          DG Knee Complete 4 Views Right (Final result) Result time: 06/30/14 23:00:14   Final result by Rad Results In Interface (06/30/14 23:00:14)   Narrative:   CLINICAL DATA: RIGHT knee pain, fell on wet bathtub today, prior RIGHT knee surgery to remove a cyst  EXAM: RIGHT KNEE - COMPLETE 4+ VIEW  COMPARISON: 02/23/2012  FINDINGS: Diffuse osseous demineralization.  Poorly defined area of predominant in the sclerotic attenuation at the central proximal tibial metaphysis, corresponding to site of curettage and bone grafting as reported on operative report of 10/22/2013.  This area is new since the previous exam.  Medial compartment joint space narrowing and spur formation.  No acute fracture, dislocation or bone destruction.  Small knee joint effusion present.  IMPRESSION: Osseous demineralization with mild degenerative changes RIGHT knee and small knee joint effusion.  Area of poorly defined sclerosis within the proximal tibial metaphysis, corresponding to an area of reported curettage and bone grafting on prior operative report of 10/22/2013 though no postoperative radiographs are available ; consider comparison to prior postoperative images to demonstrate stability.  No definite acute bony abnormalities.   Electronically Signed By: Ulyses SouthwardMark Boles M.D. On: 06/30/2014 23:00          DG Lumbar Spine Complete (Final result) Result time: 06/30/14 23:00:11   Final result by Rad Results In Interface (06/30/14 23:00:11)   Narrative:   CLINICAL DATA: Slipped and fall  injury with diffuse low back pain. Previous lumbar surgery.  EXAM: LUMBAR SPINE - COMPLETE 4+ VIEW  COMPARISON: 02/23/2012  FINDINGS: Postoperative changes with posterior laminectomies, posterior rod and screw fixation, and intervertebral disc spacers  at L4-5 and L5-S1 levels. Surgical hardware appears unchanged in position since previous study. Normal alignment of the lumbar spine. Degenerative changes with narrowed lumbar interspaces and endplate hypertrophic changes similar to previous study. No vertebral compression deformities.  IMPRESSION: No change in appearance of postoperative changes from L4 to the sacrum. Degenerative changes. No acute displaced fractures identified.   Electronically Signed By: Burman Nieves M.D. On: 06/30/2014 23:00          DG Chest 2 View (Final result) Result time: 06/30/14 22:59:14   Final result by Rad Results In Interface (06/30/14 22:59:14)   Narrative:   CLINICAL DATA: Pt c/o left breast pain s/p fall when she slipped in her wet bathtub today, "bouncing" multiple times.  EXAM: CHEST 2 VIEW  COMPARISON: 04/14/2014.  FINDINGS: Cardiac silhouette is normal size. Normal mediastinal and hilar contours. Clear lungs. No pleural effusion or pneumothorax. Bony thorax is intact. No fractures seen. Right PICC has its tip just above the caval atrial junction, well positioned.  IMPRESSION: No acute cardiopulmonary disease.   Electronically Signed By: Amie Portland M.D. On: 06/30/2014 22:59       Imaging Review No results found.   EKG Interpretation   Date/Time:  Monday June 30 2014 19:07:21 EST Ventricular Rate:  89 PR Interval:  157 QRS Duration: 86 QT Interval:  391 QTC Calculation: 476 R Axis:   6 Text Interpretation:  Sinus rhythm Atrial premature complex Low voltage,  precordial leads ED PHYSICIAN INTERPRETATION AVAILABLE IN [facility]  Confirmed by TEST, Record ([id number]) on 07/02/2014  7:59:23 AM      MDM   Final diagnoses:  Fall   No acute abnormalities found on xray. Pain managed and patient referred back to Orthopedic doctor.   53 y.o.[name]'s  with back pain. No neurological deficits and normal neuro exam. Patient can walk. No loss of bowel or bladder control. No concern for cauda equina at this time base on HPI and physical exam findings. No fever, night sweats, weight loss, h/o cancer, IVDU.   RICE protocol and pain medicine indicated and discussed with patient.   Patient Plan 1. Medications: pain medication and muscle relaxer. Cont usual home medications unless otherwise directed. 2. Treatment: rest, drink plenty of fluids, gentle stretching as discussed, alternate ice and heat  3. Follow Up: Please followup with your primary doctor for discussion of your diagnoses and further evaluation after today's visit; if you do not have a primary care doctor use the resource guide provided to find one  Advised to follow-up with the orthopedist if symptoms do not start to resolve in the next 2-3 days. If develop loss of bowel or urinary control return to the ED as soon as possible for further evaluation. To take the medications as prescribed as they can cause harm if not taken appropriately.   Vital signs are stable at discharge. Filed Vitals:   06/30/14 2255  BP: 141/51  Pulse: 78  Temp:   Resp: 16    Patient/guardian has voiced understanding and agreed to follow-up with the PCP or specialist.         Dorthula Matas, PA-C 07/04/14 1804  Tomasita Crumble, MD 07/05/14 [phone]

## 2014-06-30 NOTE — Discharge Instructions (Signed)

## 2014-06-30 NOTE — ED Notes (Signed)
Pt states she slipped and fell yesterday, landed on knees, then back, then breast c/o light headache, chest pains, nausea.

## 2014-07-07 ENCOUNTER — Encounter: Payer: Self-pay | Admitting: Family Medicine

## 2014-07-07 ENCOUNTER — Ambulatory Visit (INDEPENDENT_AMBULATORY_CARE_PROVIDER_SITE_OTHER): Payer: BC Managed Care – PPO | Admitting: Family Medicine

## 2014-07-07 ENCOUNTER — Ambulatory Visit (INDEPENDENT_AMBULATORY_CARE_PROVIDER_SITE_OTHER): Payer: BC Managed Care – PPO

## 2014-07-07 VITALS — BP 128/80 | HR 102 | Temp 98.1°F | Wt 274.0 lb

## 2014-07-07 DIAGNOSIS — E7800 Pure hypercholesterolemia, unspecified: Secondary | ICD-10-CM

## 2014-07-07 DIAGNOSIS — F32A Depression, unspecified: Secondary | ICD-10-CM

## 2014-07-07 DIAGNOSIS — Z23 Encounter for immunization: Secondary | ICD-10-CM

## 2014-07-07 DIAGNOSIS — F329 Major depressive disorder, single episode, unspecified: Secondary | ICD-10-CM

## 2014-07-07 DIAGNOSIS — I10 Essential (primary) hypertension: Secondary | ICD-10-CM

## 2014-07-07 DIAGNOSIS — Z79899 Other long term (current) drug therapy: Secondary | ICD-10-CM

## 2014-07-07 NOTE — Progress Notes (Signed)
Subjective:    Patient ID: [name], female    DOB: 02-27-1961, 53 y.o.   MRN: [id number]  HPI Patient seen for medical follow-up. She has had recent left total knee replacement had to have repeat surgery secondary to infection. She is finally doing well at this time. Her chronic problems include history of obesity, osteoarthritis, hyperlipidemia, hypertension, GERD, migraine headaches, recurrent depression. She is followed by behavioral health and is maintained on lithium. She had thyroid function back in June which was normal. Requesting repeat. Hypertension has been stable. No dizziness. Denies any chest pains or dyspnea. She has still not had flu vaccine yet. She had some elevated blood sugars by recent labs these were mostly inpatient and possibly with IV fluids. She did have history of gestational diabetes and so is at risk for type 2 diabetes.  Past Medical History  Diagnosis Date  . Anxiety   . Depression   . Hypertension   . High cholesterol   . GERD (gastroesophageal reflux disease)   . Colon polyps   . Asthma   . PONV (postoperative nausea and vomiting)   . Arthritis   . Anemia     hx of with pregnancy   . Sleep apnea     has not use CPAP machine in 2 yrs/ DOES NOT KNOW IF SHE NEEDS TO USE C - PAP   Past Surgical History  Procedure Laterality Date  . Endometrial ablation w/ novasure    . Carpal tunnel release  2007    both hands  . Trigger finger release surgeyr       bilateral   . Tubal ligation    . Knee arthroscopy      let knee 03/2013   . Knee arthroscopy with medial menisectomy Right 10/22/2013    Procedure: RIGHT KNEE ARTHROSCOPY WITH MEDIAL MENISECTOMY, abrasion chrondrplasty Brigid Re/CLEAN OUT/CORRECT CURETTEMENT/BONE GRAFT/PROXIMAL TIBIA ;  Surgeon: Jacki Conesonald A Gioffre, MD;  Location: [facility];  Service: Orthopedics;  Laterality: Right;  . Back surgery  2012    lumb fusion  . Colonoscopy    . Excision metacarpal mass Right 03/10/2014    Procedure: EXCISION  MASS RIGHT HAND FIRST WEB SPACE DORSAL PALMAR INCISION ;  Surgeon: Cindee SaltGary Kuzma, MD;  Location: [facility];  Service: Orthopedics;  Laterality: Right;  . Total knee arthroplasty Left 04/16/2014    Procedure: LEFT TOTAL KNEE ARTHROPLASTY;  Surgeon: Jacki Conesonald A Gioffre, MD;  Location: [facility];  Service: Orthopedics;  Laterality: Left;  . Irrigation and debridement knee Left 05/23/2014    Procedure: IRRIGATION AND DEBRIDEMENT KNEE;  Surgeon: Shelda PalMatthew D Olin, MD;  Location: [facility];  Service: Orthopedics;  Laterality: Left;  With POLYETHYLENE EXCHANGE    reports that she has never smoked. She has never used smokeless tobacco. She reports that she does not drink alcohol or use illicit drugs. family history includes Alcohol abuse in her mother; Arthritis in her mother; Cancer in her brother, daughter, and maternal grandmother; Heart disease (age of onset: 4775) in her father; Hyperlipidemia in her father; Hypertension in her father and sister. Allergies  Allergen Reactions  . Adhesive [Tape] Other (See Comments)    Peeled off skin  . Ceclor [Cefaclor] Other (See Comments)    Blisters on hands  . Chocolate Other (See Comments)    migraines  . Venlafaxine Other (See Comments)    "refuses to take - makes me feel like I am dying"      Review of Systems  Constitutional: Negative for fatigue.  Eyes: Negative for visual disturbance.  Respiratory: Negative for cough, chest tightness, shortness of breath and wheezing.   Cardiovascular: Negative for chest pain, palpitations and leg swelling.  Endocrine: Negative for polydipsia and polyuria.  Neurological: Negative for dizziness, seizures, syncope, weakness, light-headedness and headaches.       Objective:   Physical Exam  Constitutional: She appears well-developed and well-nourished.  Neck: Neck supple.  Cardiovascular: Normal rate and regular rhythm.   Pulmonary/Chest: Effort normal and breath sounds normal. No respiratory distress. She has  no wheezes. She has no rales.  Musculoskeletal: She exhibits no edema.          Assessment & Plan:  #1 hypertension stable and at goal. Continue current medications #2 hyperlipidemia. Schedule fasting lipid and hepatic panel. Continue pravastatin #3 health maintenance flu vaccine given  #4 at risk for type 2 diabetes. Schedule fasting labs with blood sugar and hemoglobin A1c

## 2014-07-07 NOTE — Progress Notes (Signed)
Pre visit review using our clinic review tool, if applicable. No additional management support is needed unless otherwise documented below in the visit note. 

## 2014-07-08 ENCOUNTER — Telehealth: Payer: Self-pay | Admitting: Family Medicine

## 2014-07-08 NOTE — Telephone Encounter (Signed)
emmi emailed °

## 2014-07-09 ENCOUNTER — Other Ambulatory Visit: Payer: BC Managed Care – PPO

## 2014-07-14 ENCOUNTER — Other Ambulatory Visit: Payer: BC Managed Care – PPO

## 2014-08-12 ENCOUNTER — Other Ambulatory Visit: Payer: Self-pay | Admitting: Family Medicine

## 2014-10-10 ENCOUNTER — Other Ambulatory Visit: Payer: Self-pay

## 2014-10-16 ENCOUNTER — Other Ambulatory Visit (INDEPENDENT_AMBULATORY_CARE_PROVIDER_SITE_OTHER): Payer: BC Managed Care – PPO

## 2014-10-16 DIAGNOSIS — Z79899 Other long term (current) drug therapy: Secondary | ICD-10-CM

## 2014-10-16 DIAGNOSIS — E7800 Pure hypercholesterolemia, unspecified: Secondary | ICD-10-CM

## 2014-10-16 DIAGNOSIS — I10 Essential (primary) hypertension: Secondary | ICD-10-CM

## 2014-10-16 LAB — BASIC METABOLIC PANEL
BUN: 16 mg/dL (ref 6–23)
CO2: 27 mEq/L (ref 19–32)
Calcium: 9.6 mg/dL (ref 8.4–10.5)
Chloride: 101 mEq/L (ref 96–112)
Creatinine, Ser: 0.82 mg/dL (ref 0.40–1.20)
GFR: 77.95 mL/min (ref >60.00)
Glucose, Bld: 101 mg/dL — ABNORMAL HIGH (ref 70–99)
Potassium: 4.1 mEq/L (ref 3.5–5.1)
Sodium: 136 mEq/L (ref 135–145)

## 2014-10-16 LAB — LIPID PANEL
Cholesterol: 225 mg/dL — ABNORMAL HIGH (ref 0–200)
HDL: 44.6 mg/dL (ref >39.00)
LDL Cholesterol: 143 mg/dL — ABNORMAL HIGH (ref 0–99)
NonHDL: 180.4
Total CHOL/HDL Ratio: 5
Triglycerides: 187 mg/dL — ABNORMAL HIGH (ref 0.0–149.0)
VLDL: 37.4 mg/dL (ref 0.0–40.0)

## 2014-10-16 LAB — HEPATIC FUNCTION PANEL
ALT: 41 U/L — ABNORMAL HIGH (ref 0–35)
AST: 39 U/L — ABNORMAL HIGH (ref 0–37)
Albumin: 4.2 g/dL (ref 3.5–5.2)
Alkaline Phosphatase: 126 U/L — ABNORMAL HIGH (ref 39–117)
Bilirubin, Direct: 0.1 mg/dL (ref 0.0–0.3)
Total Bilirubin: 0.3 mg/dL (ref 0.2–1.2)
Total Protein: 7.8 g/dL (ref 6.0–8.3)

## 2014-10-16 LAB — HEMOGLOBIN A1C: Hgb A1c MFr Bld: 6.2 % (ref 4.6–6.5)

## 2014-10-16 LAB — TSH: TSH: 2.53 u[IU]/mL (ref 0.35–4.50)

## 2014-10-17 ENCOUNTER — Other Ambulatory Visit: Payer: Self-pay | Admitting: Family Medicine

## 2014-10-21 ENCOUNTER — Ambulatory Visit (INDEPENDENT_AMBULATORY_CARE_PROVIDER_SITE_OTHER): Payer: BC Managed Care – PPO | Admitting: Family Medicine

## 2014-10-21 ENCOUNTER — Encounter: Payer: Self-pay | Admitting: Family Medicine

## 2014-10-21 VITALS — BP 132/80 | HR 105 | Temp 97.4°F | Wt 294.0 lb

## 2014-10-21 DIAGNOSIS — R7401 Elevation of levels of liver transaminase levels: Secondary | ICD-10-CM | POA: Insufficient documentation

## 2014-10-21 DIAGNOSIS — R7309 Other abnormal glucose: Secondary | ICD-10-CM | POA: Diagnosis not present

## 2014-10-21 DIAGNOSIS — R7303 Prediabetes: Secondary | ICD-10-CM | POA: Insufficient documentation

## 2014-10-21 DIAGNOSIS — R740 Nonspecific elevation of levels of transaminase and lactic acid dehydrogenase [LDH]: Secondary | ICD-10-CM

## 2014-10-21 DIAGNOSIS — E7800 Pure hypercholesterolemia, unspecified: Secondary | ICD-10-CM

## 2014-10-21 DIAGNOSIS — I10 Essential (primary) hypertension: Secondary | ICD-10-CM | POA: Diagnosis not present

## 2014-10-21 NOTE — Progress Notes (Signed)
Subjective:    Patient ID: [name], female    DOB: 09-Sep-1960, 54 y.o.   MRN: [id number]  HPI Patient seen for medical follow-up. She has history of obesity, osteoarthritis, migraine headaches, hyperlipidemia, GERD, hypertension, depression. She is followed by psychiatry and is on lithium. She had several recent labs done which are reviewed today. She has history of prediabetes. Recent A1c 6.2%. Unfortunately, she has gained 20 pounds since last visit. Her medications reviewed. Blood pressures been stable. She takes pravastatin for hyperlipidemia. No history of CAD or peripheral vascular disease. No myalgias.  She has very poor compliance with diet and exercise. Currently not exercising. She consumes considerable calories daily in terms of colas and sweetened tea.  Past Medical History  Diagnosis Date  . Anxiety   . Depression   . Hypertension   . High cholesterol   . GERD (gastroesophageal reflux disease)   . Colon polyps   . Asthma   . PONV (postoperative nausea and vomiting)   . Arthritis   . Anemia     hx of with pregnancy   . Sleep apnea     has not use CPAP machine in 2 yrs/ DOES NOT KNOW IF SHE NEEDS TO USE C - PAP   Past Surgical History  Procedure Laterality Date  . Endometrial ablation w/ novasure    . Carpal tunnel release  2007    both hands  . Trigger finger release surgeyr       bilateral   . Tubal ligation    . Knee arthroscopy      let knee 03/2013   . Knee arthroscopy with medial menisectomy Right 10/22/2013    Procedure: RIGHT KNEE ARTHROSCOPY WITH MEDIAL MENISECTOMY, abrasion chrondrplasty Brigid Re/CLEAN OUT/CORRECT CURETTEMENT/BONE GRAFT/PROXIMAL TIBIA ;  Surgeon: Jacki Conesonald A Gioffre, MD;  Location: [facility];  Service: Orthopedics;  Laterality: Right;  . Back surgery  2012    lumb fusion  . Colonoscopy    . Excision metacarpal mass Right 03/10/2014    Procedure: EXCISION MASS RIGHT HAND FIRST WEB SPACE DORSAL PALMAR INCISION ;  Surgeon: Cindee SaltGary Kuzma, MD;   Location: [facility];  Service: Orthopedics;  Laterality: Right;  . Total knee arthroplasty Left 04/16/2014    Procedure: LEFT TOTAL KNEE ARTHROPLASTY;  Surgeon: Jacki Conesonald A Gioffre, MD;  Location: [facility];  Service: Orthopedics;  Laterality: Left;  . Irrigation and debridement knee Left 05/23/2014    Procedure: IRRIGATION AND DEBRIDEMENT KNEE;  Surgeon: Shelda PalMatthew D Olin, MD;  Location: [facility];  Service: Orthopedics;  Laterality: Left;  With POLYETHYLENE EXCHANGE    reports that she has never smoked. She has never used smokeless tobacco. She reports that she does not drink alcohol or use illicit drugs. family history includes Alcohol abuse in her mother; Arthritis in her mother; Cancer in her brother, daughter, and maternal grandmother; Heart disease (age of onset: 3375) in her father; Hyperlipidemia in her father; Hypertension in her father and sister. Allergies  Allergen Reactions  . Adhesive [Tape] Other (See Comments)    Peeled off skin  . Ceclor [Cefaclor] Other (See Comments)    Blisters on hands  . Chocolate Other (See Comments)    migraines  . Venlafaxine Other (See Comments)    "refuses to take - makes me feel like I am dying"      Review of Systems  Constitutional: Negative for fatigue and unexpected weight change.  Eyes: Negative for visual disturbance.  Respiratory: Negative for cough, chest tightness,  shortness of breath and wheezing.   Cardiovascular: Negative for chest pain, palpitations and leg swelling.  Gastrointestinal: Negative for abdominal pain.  Endocrine: Negative for polydipsia and polyuria.  Genitourinary: Negative for dysuria.  Musculoskeletal: Positive for arthralgias.  Neurological: Negative for dizziness, seizures, syncope, weakness, light-headedness and headaches.       Objective:   Physical Exam  Constitutional: She appears well-developed and well-nourished.  Neck: Neck supple. No thyromegaly present.  Cardiovascular: Normal rate and  regular rhythm.   Pulmonary/Chest: Effort normal and breath sounds normal. No respiratory distress. She has no wheezes. She has no rales.  Musculoskeletal: She exhibits no edema.  Lymphadenopathy:    She has no cervical adenopathy.          Assessment & Plan:  #1 morbid obesity. We spent quite some time discussing strategies for weight loss. We have suggested tracking app such as my fitness pal. Establish more consistent exercise. Reduce sugars and starches. #2 mild elevated liver transaminases. Probable fatty liver related. These have improved with weight loss in the past. Recommend that she lose some weight and reassess in 4-6 months  #3 hypertension stable and at goal #4 dyslipidemia. We discussed possible change to more potent statin and at this point she is reluctant. #5 prediabetes.  Recent A1C 6.2%.  Lose some weight as above and needs to reduce sugar intake.

## 2014-10-21 NOTE — Progress Notes (Signed)
Pre visit review using our clinic review tool, if applicable. No additional management support is needed unless otherwise documented below in the visit note. 

## 2014-10-21 NOTE — Patient Instructions (Signed)
Lose some weight Consider tracking tool such as Myfitnesspal   Reduce  Sugars and starches- especially in beverages.

## 2014-10-23 ENCOUNTER — Encounter: Payer: Self-pay | Admitting: Family Medicine

## 2014-11-11 ENCOUNTER — Other Ambulatory Visit: Payer: Self-pay | Admitting: Family Medicine

## 2014-11-22 ENCOUNTER — Other Ambulatory Visit: Payer: Self-pay | Admitting: Family Medicine

## 2014-12-02 ENCOUNTER — Other Ambulatory Visit: Payer: Self-pay | Admitting: Surgical

## 2014-12-15 NOTE — Patient Instructions (Addendum)
[name]  12/15/2014   Your procedure is scheduled on:     12/23/2014    [facility] and follow signs to               Short Stay Center at      1015 AM.  Call this number if you have problems the morning of surgery [phone]   Remember: ONLY 1 PERSON MAY GO WITH YOU TO SHORT STAY TO GET  READY MORNING OF YOUR SURGERY.  Do not eat food or drink liquids :After Midnight.     Take these medicines the morning of surgery with A SIP OF WATER:   Albuterol Inhaler if needed and bring, Amlodipine ( NOrvasc), symbicort Inhaler and bring, Zoloft, refresh eye drops if needed , hydrocodone if needed                                You may not have any metal on your body including hair pins and              piercings  Do not wear jewelry, make-up, lotions, powders or perfumes, deodorant             Do not wear nail polish.  Do not shave  48 hours prior to surgery.     Do not bring valuables to the hospital. [name] IS NOT             RESPONSIBLE   FOR VALUABLES.  Contacts, dentures or bridgework may not be worn into surgery.  Leave suitcase in the car. After surgery it may be brought to your room.     Special Instructions: coughing and deep breathing exercises, leg exercises               Please read over the following fact sheets you were given: _____________________________________________________________________             [facility] - Preparing for [facility] Before surgery, you can play an important role.  Because skin is not sterile, your skin needs to be as free of germs as possible.  You can reduce the number of germs on your skin by washing with CHG (chlorahexidine gluconate) soap before surgery.  CHG is an antiseptic cleaner which kills germs and bonds with the skin to continue killing germs even after washing. Please DO NOT use if you have an allergy to CHG or antibacterial soaps.  If your skin becomes reddened/irritated  stop using the CHG and inform your nurse when you arrive at Short Stay. Do not shave (including legs and underarms) for at least 48 hours prior to the first CHG shower.  You may shave your face/neck. Please follow these instructions carefully:  1.  Shower with CHG Soap the night before surgery and the  morning of Surgery.  2.  If you choose to wash your hair, wash your hair first as usual with your  normal  shampoo.  3.  After you shampoo, rinse your hair and body thoroughly to remove the  shampoo.                           4.  Use CHG as you would any other liquid soap.  You can apply chg directly  to the skin and wash  Gently with a scrungie or clean washcloth.  5.  Apply the CHG Soap to your body ONLY FROM THE NECK DOWN.   Do not use on face/ open                           Wound or open sores. Avoid contact with eyes, ears mouth and genitals (private parts).                       Wash face,  Genitals (private parts) with your normal soap.             6.  Wash thoroughly, paying special attention to the area where your surgery  will be performed.  7.  Thoroughly rinse your body with warm water from the neck down.  8.  DO NOT shower/wash with your normal soap after using and rinsing off  the CHG Soap.                9.  Pat yourself dry with a clean towel.            10.  Wear clean pajamas.            11.  Place clean sheets on your bed the night of your first shower and do not  sleep with pets. Day of Surgery : Do not apply any lotions/deodorants the morning of surgery.  Please wear clean clothes to the hospital/surgery center.  FAILURE TO FOLLOW THESE INSTRUCTIONS MAY RESULT IN THE CANCELLATION OF YOUR SURGERY PATIENT SIGNATURE_________________________________  NURSE SIGNATURE__________________________________  ________________________________________________________________________  WHAT IS A BLOOD TRANSFUSION? Blood Transfusion Information  A transfusion is the  replacement of blood or some of its parts. Blood is made up of multiple cells which provide different functions.  Red blood cells carry oxygen and are used for blood loss replacement.  White blood cells fight against infection.  Platelets control bleeding.  Plasma helps clot blood.  Other blood products are available for specialized needs, such as hemophilia or other clotting disorders. BEFORE THE TRANSFUSION  Who gives blood for transfusions?   Healthy volunteers who are fully evaluated to make sure their blood is safe. This is blood bank blood. Transfusion therapy is the safest it has ever been in the practice of medicine. Before blood is taken from a donor, a complete history is taken to make sure that person has no history of diseases nor engages in risky social behavior (examples are intravenous drug use or sexual activity with multiple partners). The donor's travel history is screened to minimize risk of transmitting infections, such as malaria. The donated blood is tested for signs of infectious diseases, such as HIV and hepatitis. The blood is then tested to be sure it is compatible with you in order to minimize the chance of a transfusion reaction. If you or a relative donates blood, this is often done in anticipation of surgery and is not appropriate for emergency situations. It takes many days to process the donated blood. RISKS AND COMPLICATIONS Although transfusion therapy is very safe and saves many lives, the main dangers of transfusion include:  1. Getting an infectious disease. 2. Developing a transfusion reaction. This is an allergic reaction to something in the blood you were given. Every precaution is taken to prevent this. The decision to have a blood transfusion has been considered carefully by your caregiver before blood is given. Blood is not given unless the benefits outweigh  the risks. AFTER THE TRANSFUSION  Right after receiving a blood transfusion, you will usually  feel much better and more energetic. This is especially true if your red blood cells have gotten low (anemic). The transfusion raises the level of the red blood cells which carry oxygen, and this usually causes an energy increase.  The nurse administering the transfusion will monitor you carefully for complications. HOME CARE INSTRUCTIONS  No special instructions are needed after a transfusion. You may find your energy is better. Speak with your caregiver about any limitations on activity for underlying diseases you may have. SEEK MEDICAL CARE IF:   Your condition is not improving after your transfusion.  You develop redness or irritation at the intravenous (IV) site. SEEK IMMEDIATE MEDICAL CARE IF:  Any of the following symptoms occur over the next 12 hours:  Shaking chills.  You have a temperature by mouth above 102 F (38.9 C), not controlled by medicine.  Chest, back, or muscle pain.  People around you feel you are not acting correctly or are confused.  Shortness of breath or difficulty breathing.  Dizziness and fainting.  You get a rash or develop hives.  You have a decrease in urine output.  Your urine turns a dark color or changes to pink, red, or brown. Any of the following symptoms occur over the next 10 days:  You have a temperature by mouth above 102 F (38.9 C), not controlled by medicine.  Shortness of breath.  Weakness after normal activity.  The white part of the eye turns yellow (jaundice).  You have a decrease in the amount of urine or are urinating less often.  Your urine turns a dark color or changes to pink, red, or brown. Document Released: 07/08/2000 Document Revised: 10/03/2011 Document Reviewed: 02/25/2008 ExitCare Patient Information 2014 [place].  _______________________________________________________________________  Incentive Spirometer  An incentive spirometer is a tool that can help keep your lungs clear and active. This tool  measures how well you are filling your lungs with each breath. Taking long deep breaths may help reverse or decrease the chance of developing breathing (pulmonary) problems (especially infection) following:  A long period of time when you are unable to move or be active. BEFORE THE PROCEDURE   If the spirometer includes an indicator to show your best effort, your nurse or respiratory therapist will set it to a desired goal.  If possible, sit up straight or lean slightly forward. Try not to slouch.  Hold the incentive spirometer in an upright position. INSTRUCTIONS FOR USE  3. Sit on the edge of your bed if possible, or sit up as far as you can in bed or on a chair. 4. Hold the incentive spirometer in an upright position. 5. Breathe out normally. 6. Place the mouthpiece in your mouth and seal your lips tightly around it. 7. Breathe in slowly and as deeply as possible, raising the piston or the ball toward the top of the column. 8. Hold your breath for 3-5 seconds or for as long as possible. Allow the piston or ball to fall to the bottom of the column. 9. Remove the mouthpiece from your mouth and breathe out normally. 10. Rest for a few seconds and repeat Steps 1 through 7 at least 10 times every 1-2 hours when you are awake. Take your time and take a few normal breaths between deep breaths. 11. The spirometer may include an indicator to show your best effort. Use the indicator as a goal to work  toward during each repetition. 12. After each set of 10 deep breaths, practice coughing to be sure your lungs are clear. If you have an incision (the cut made at the time of surgery), support your incision when coughing by placing a pillow or rolled up towels firmly against it. Once you are able to get out of bed, walk around indoors and cough well. You may stop using the incentive spirometer when instructed by your caregiver.  RISKS AND COMPLICATIONS  Take your time so you do not get dizzy or  light-headed.  If you are in pain, you may need to take or ask for pain medication before doing incentive spirometry. It is harder to take a deep breath if you are having pain. AFTER USE  Rest and breathe slowly and easily.  It can be helpful to keep track of a log of your progress. Your caregiver can provide you with a simple table to help with this. If you are using the spirometer at home, follow these instructions: [place] IF:   You are having difficultly using the spirometer.  You have trouble using the spirometer as often as instructed.  Your pain medication is not giving enough relief while using the spirometer.  You develop fever of 100.5 F (38.1 C) or higher. SEEK IMMEDIATE MEDICAL CARE IF:   You cough up bloody sputum that had not been present before.  You develop fever of 102 F (38.9 C) or greater.  You develop worsening pain at or near the incision site. MAKE SURE YOU:   Understand these instructions.  Will watch your condition.  Will get help right away if you are not doing well or get worse. Document Released: 11/21/2006 Document Revised: 10/03/2011 Document Reviewed: 01/22/2007 [facility] Patient Information 2014 [place], [place].   ________________________________________________________________________

## 2014-12-17 ENCOUNTER — Encounter (HOSPITAL_COMMUNITY): Payer: Self-pay

## 2014-12-17 ENCOUNTER — Encounter (HOSPITAL_COMMUNITY)
Admission: RE | Admit: 2014-12-17 | Discharge: 2014-12-17 | Disposition: A | Discharge status: Home / Self Care | Payer: BC Managed Care – PPO | Source: Ambulatory Visit | Attending: Orthopedic Surgery | Admitting: Orthopedic Surgery

## 2014-12-17 ENCOUNTER — Ambulatory Visit (HOSPITAL_COMMUNITY)
Admission: RE | Admit: 2014-12-17 | Discharge: 2014-12-17 | Disposition: A | Discharge status: Home / Self Care | Payer: BC Managed Care – PPO | Source: Ambulatory Visit | Attending: Surgical | Admitting: Surgical

## 2014-12-17 DIAGNOSIS — Z01818 Encounter for other preprocedural examination: Secondary | ICD-10-CM | POA: Insufficient documentation

## 2014-12-17 DIAGNOSIS — I517 Cardiomegaly: Secondary | ICD-10-CM | POA: Insufficient documentation

## 2014-12-17 DIAGNOSIS — I252 Old myocardial infarction: Secondary | ICD-10-CM | POA: Insufficient documentation

## 2014-12-17 HISTORY — DX: Headache: R51

## 2014-12-17 HISTORY — DX: Reserved for inherently not codable concepts without codable children: IMO0001

## 2014-12-17 HISTORY — DX: Headache, unspecified: R51.9

## 2014-12-17 LAB — COMPREHENSIVE METABOLIC PANEL
ALT: 30 U/L (ref 14–54)
AST: 33 U/L (ref 15–41)
Albumin: 4.1 g/dL (ref 3.5–5.0)
Alkaline Phosphatase: 108 U/L (ref 38–126)
Anion gap: 9 (ref 5–15)
BUN: 16 mg/dL (ref 6–20)
CO2: 28 mmol/L (ref 22–32)
Calcium: 9.7 mg/dL (ref 8.9–10.3)
Chloride: 101 mmol/L (ref 101–111)
Creatinine, Ser: 0.93 mg/dL (ref 0.44–1.00)
GFR calc Af Amer: >60 mL/min (ref >60)
GFR calc non Af Amer: >60 mL/min (ref >60)
Glucose, Bld: 79 mg/dL (ref 65–99)
Potassium: 4.3 mmol/L (ref 3.5–5.1)
Sodium: 138 mmol/L (ref 135–145)
Total Bilirubin: 0.4 mg/dL (ref 0.3–1.2)
Total Protein: 7.8 g/dL (ref 6.5–8.1)

## 2014-12-17 LAB — URINALYSIS, ROUTINE W REFLEX MICROSCOPIC
Bilirubin Urine: NEGATIVE
Glucose, UA: NEGATIVE mg/dL
Hgb urine dipstick: NEGATIVE
Ketones, ur: NEGATIVE mg/dL
Leukocytes, UA: NEGATIVE
Nitrite: NEGATIVE
Protein, ur: NEGATIVE mg/dL
Specific Gravity, Urine: 1.022 (ref 1.005–1.030)
Urobilinogen, UA: 0.2 mg/dL (ref 0.0–1.0)
pH: 5 (ref 5.0–8.0)

## 2014-12-17 LAB — CBC WITH DIFFERENTIAL/PLATELET
Basophils Absolute: 0 10*3/uL (ref 0.0–0.1)
Basophils Relative: 0 % (ref 0–1)
Eosinophils Absolute: 0.2 10*3/uL (ref 0.0–0.7)
Eosinophils Relative: 3 % (ref 0–5)
HCT: 39.6 % (ref 36.0–46.0)
Hemoglobin: 12.2 g/dL (ref 12.0–15.0)
Lymphocytes Relative: 22 % (ref 12–46)
Lymphs Abs: 1.6 10*3/uL (ref 0.7–4.0)
MCH: 27.1 pg (ref 26.0–34.0)
MCHC: 30.8 g/dL (ref 30.0–36.0)
MCV: 88 fL (ref 78.0–100.0)
Monocytes Absolute: 0.6 10*3/uL (ref 0.1–1.0)
Monocytes Relative: 9 % (ref 3–12)
Neutro Abs: 4.7 10*3/uL (ref 1.7–7.7)
Neutrophils Relative %: 66 % (ref 43–77)
Platelets: 260 10*3/uL (ref 150–400)
RBC: 4.5 MIL/uL (ref 3.87–5.11)
RDW: 14.5 % (ref 11.5–15.5)
WBC: 7.1 10*3/uL (ref 4.0–10.5)

## 2014-12-17 LAB — PROTIME-INR
INR: 1.06 (ref 0.00–1.49)
Prothrombin Time: 14 seconds (ref 11.6–15.2)

## 2014-12-17 LAB — APTT: aPTT: 30 seconds (ref 24–37)

## 2014-12-17 LAB — SURGICAL PCR SCREEN
MRSA, PCR: NEGATIVE
Staphylococcus aureus: NEGATIVE

## 2014-12-17 NOTE — H&P (Signed)
TOTAL KNEE ADMISSION H&P  Patient is being admitted for right total knee arthroplasty.  Subjective:  Chief Complaint:right knee pain.  HPI: [name], 54 y.o. female, has a history of pain and functional disability in the right knee due to arthritis and has failed non-surgical conservative treatments for greater than 12 weeks to includeNSAID's and/or analgesics, corticosteriod injections, viscosupplementation injections and activity modification.  Onset of symptoms was gradual, starting 3 years ago with gradually worsening course since that time. The patient noted prior procedures on the knee to include  arthroscopy, menisectomy and bone grafting of necrotic tibia on the right knee(s).  Patient currently rates pain in the right knee(s) at 8 out of 10 with activity. Patient has night pain, worsening of pain with activity and weight bearing, pain that interferes with activities of daily living, pain with passive range of motion, crepitus and joint swelling.  Patient has evidence of periarticular osteophytes and joint space narrowing by imaging studies. There is no active infection.  Patient Active Problem List   Diagnosis Date Noted  . Prediabetes 10/21/2014  . Transaminasemia 10/21/2014  . Infection of total left knee replacement 05/23/2014  . Total knee replacement status 04/16/2014  . Aneurysmal bone cyst, 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  . Osteoarthritis of right knee 10/22/2013  . Complex tear of medial meniscus of right knee as current injury 10/22/2013  . Chronic insomnia 10/27/2012  . Asthma, mild intermittent 10/26/2012  . Osteoarthritis 10/26/2012  . Personal history of colonic polyps 10/26/2012  . GERD (gastroesophageal reflux disease) 10/26/2012  . Essential hypertension, benign 10/26/2012  . Shortness of breath 10/02/2012  . Precordial pain 10/02/2012  . Depression   . High cholesterol   . Migraines    Past Medical History  Diagnosis Date  . Anxiety   . Depression   .  Hypertension   . High cholesterol   . GERD (gastroesophageal reflux disease)   . Colon polyps   . Asthma   . PONV (postoperative nausea and vomiting)   . Arthritis   . Anemia     hx of with pregnancy   . Sleep apnea     has not use CPAP machine in 2 yrs/ DOES NOT KNOW IF SHE NEEDS TO USE C - PAP  . Headache     occasional headaches   . Shortness of breath dyspnea     due to weight gain     Past Surgical History  Procedure Laterality Date  . Endometrial ablation w/ novasure    . Carpal tunnel release  2007    both hands  . Trigger finger release surgeyr       bilateral   . Tubal ligation    . Knee arthroscopy      let knee 03/2013   . Knee arthroscopy with medial menisectomy Right 10/22/2013    Procedure: RIGHT KNEE ARTHROSCOPY WITH MEDIAL MENISECTOMY, abrasion chrondrplasty Brigid Re/CLEAN OUT/CORRECT CURETTEMENT/BONE GRAFT/PROXIMAL TIBIA ;  Surgeon: Jacki Conesonald A Gioffre, MD;  Location: [facility];  Service: Orthopedics;  Laterality: Right;  . Back surgery  2012    lumb fusion  . Colonoscopy    . Excision metacarpal mass Right 03/10/2014    Procedure: EXCISION MASS RIGHT HAND FIRST WEB SPACE DORSAL PALMAR INCISION ;  Surgeon: Cindee SaltGary Kuzma, MD;  Location: [facility];  Service: Orthopedics;  Laterality: Right;  . Total knee arthroplasty Left 04/16/2014    Procedure: LEFT TOTAL KNEE ARTHROPLASTY;  Surgeon: Jacki Conesonald A Gioffre, MD;  Location: [facility];  Service: Orthopedics;  Laterality: Left;  . Irrigation and debridement knee Left 05/23/2014    Procedure: IRRIGATION AND DEBRIDEMENT KNEE;  Surgeon: [name], MD;  Location: [facility];  Service: Orthopedics;  Laterality: Left;  With POLYETHYLENE EXCHANGE    Current outpatient prescriptions:  .  acetaminophen (TYLENOL) 500 MG tablet, Take 1,000 mg by mouth every 6 (six) hours as needed for mild pain or moderate pain., Disp: , Rfl:  .  albuterol (PROVENTIL HFA;VENTOLIN HFA) 108 (90 BASE) MCG/ACT inhaler, Inhale 2 puffs into the lungs every 6  (six) hours as needed for wheezing., Disp: 8.5 g, Rfl: 5 .  amLODipine (NORVASC) 10 MG tablet, Take 10 mg by mouth every morning., Disp: , Rfl:  .  aspirin EC 325 MG EC tablet, Take 1 tablet (325 mg total) by mouth every 12 (twelve) hours., Disp: 30 tablet, Rfl: 0 .  budesonide-formoterol (SYMBICORT) 160-4.5 MCG/ACT inhaler, Inhale 1 puff into the lungs 2 (two) times daily. , Disp: , Rfl:  .  HYDROcodone-acetaminophen (NORCO/VICODIN) 5-325 MG per tablet, Take 1 tablet by mouth every 4 (four) hours as needed for moderate pain., Disp: , Rfl:  .  lisinopril (PRINIVIL,ZESTRIL) 20 MG tablet, TAKE 1 TABLET BY MOUTH EVERY DAY, Disp: 30 tablet, Rfl: 5 .  lithium carbonate 300 MG capsule, Take 300 mg by mouth at bedtime., Disp: , Rfl:  .  montelukast (SINGULAIR) 10 MG tablet, TAKE 1 TABLET (10 MG TOTAL) BY MOUTH AT BEDTIME., Disp: 30 tablet, Rfl: 3 .  Polyvinyl Alcohol-Povidone (REFRESH OP), Place 2 drops into both eyes daily as needed (For dry eyes.). , Disp: , Rfl:  .  pravastatin (PRAVACHOL) 40 MG tablet, Take 40 mg by mouth at bedtime., Disp: , Rfl:  .  promethazine (PHENERGAN) 25 MG tablet, TAKE 1 TABLET (25 MG TOTAL) BY MOUTH EVERY 6 (SIX) HOURS AS NEEDED. FOR NAUSEA, Disp: 30 tablet, Rfl: 3 .  sertraline (ZOLOFT) 100 MG tablet, Take 100 mg by mouth 2 (two) times daily. , Disp: , Rfl:  .  zolpidem (AMBIEN CR) 12.5 MG CR tablet, Take 12.5 mg by mouth at bedtime., Disp: , Rfl:   Allergies  Allergen Reactions  . Adhesive [Tape] Other (See Comments)    Peeled off skin  . Ceclor [Cefaclor] Other (See Comments)    Blisters on hands  . Chocolate Other (See Comments)    migraines  . Venlafaxine Other (See Comments)    "refuses to take - makes me feel like I am dying"    History  Substance Use Topics  . Smoking status: Never Smoker   . Smokeless tobacco: Never Used  . Alcohol Use: No    Family History  Problem Relation Age of Onset  . Alcohol abuse Mother   . Arthritis Mother   .  Hyperlipidemia Father   . Hypertension Father   . Heart disease Father 53    CABG  . Hypertension Sister   . Cancer Brother     colon, lung, liver  . Cancer Maternal Grandmother     breast  . Cancer Daughter     NEUROBLASTOMA     Review of Systems  Constitutional: Negative.   HENT: Negative.   Eyes: Negative.   Respiratory: Positive for shortness of breath. Negative for cough, hemoptysis, sputum production and wheezing.        SOB with exertion  Cardiovascular: Negative.   Gastrointestinal: Negative.   Genitourinary: Negative.   Musculoskeletal: Positive for joint pain. Negative for myalgias, back pain, falls  and neck pain.       Right knee pain  Skin: Negative.   Neurological: Negative.   Endo/Heme/Allergies: Negative.   Psychiatric/Behavioral: Negative.     Objective:  Physical Exam  Constitutional: She is oriented to person, place, and time. She appears well-developed. No distress.  Morbidly obese  HENT:  Head: Normocephalic and atraumatic.  Right Ear: External ear normal.  Left Ear: External ear normal.  Nose: Nose normal.  Eyes: Conjunctivae and EOM are normal.  Neck: Normal range of motion. Neck supple.  Cardiovascular: Normal rate, regular rhythm, normal heart sounds and intact distal pulses.   No murmur heard. Respiratory: Effort normal and breath sounds normal. No respiratory distress. She has no wheezes.  GI: Soft. Bowel sounds are normal. She exhibits no distension. There is no tenderness.  Musculoskeletal:       Right hip: Normal.       Left hip: Normal.       Right knee: She exhibits decreased range of motion and swelling. She exhibits no effusion and no erythema. Tenderness found. Medial joint line and lateral joint line tenderness noted.       Left knee: Normal.  Neurological: She is alert and oriented to person, place, and time. She has normal strength and normal reflexes. No sensory deficit.  Skin: No rash noted. She is not diaphoretic. No  erythema.  Psychiatric: She has a normal mood and affect. Her behavior is normal.    Vital signs in last 24 hours: Temp:  [98 F (36.7 C)] 98 F (36.7 C) (05/25 0906) Pulse Rate:  [94] 94 (05/25 0906) Resp:  [20] 20 (05/25 0906) BP: (136)/(69) 136/69 mmHg (05/25 0906) SpO2:  [98 %] 98 % (05/25 0906) Weight:  [139.708 kg (308 lb)] 139.708 kg (308 lb) (05/25 0906)    Imaging Review Plain radiographs demonstrate severe degenerative joint disease of the right knee(s). The overall alignment ismild varus. The bone quality appears to be good for age and reported activity level.  Assessment/Plan:  End stage primary osteoarthritis, right knee   The patient history, physical examination, clinical judgment of the provider and imaging studies are consistent with end stage degenerative joint disease of the right knee(s) and total knee arthroplasty is deemed medically necessary. The treatment options including medical management, injection therapy arthroscopy and arthroplasty were discussed at length. The risks and benefits of total knee arthroplasty were presented and reviewed. The risks due to aseptic loosening, infection, stiffness, patella tracking problems, thromboembolic complications and other imponderables were discussed. The patient acknowledged the explanation, agreed to proceed with the plan and consent was signed. Patient is being admitted for inpatient treatment for surgery, pain control, PT, OT, prophylactic antibiotics, VTE prophylaxis, progressive ambulation and ADL's and discharge planning. The patient is planning to be discharged home with home health services    PCP: Dr. Lilyan Gilford Cardio: Dr. Excell Seltzer  TXA IV Cannot have Aquacel dressing    Dimitri Ped PA-C

## 2014-12-17 NOTE — Progress Notes (Signed)
Stress 2014 and ECHO 2014 in EPIC  EKG- 07/01/14 in EPIC  CXR- 06/30/2014 EPIC  Dr Sander RadonBurchett- LOV- 09/2014 EPIC

## 2014-12-18 NOTE — Progress Notes (Signed)
Final EKG done 12/17/14 in EPIC.

## 2014-12-19 ENCOUNTER — Other Ambulatory Visit: Payer: Self-pay | Admitting: Family Medicine

## 2014-12-22 MED ORDER — SODIUM CHLORIDE 0.9 % IV SOLN
1500.0000 mg | INTRAVENOUS | Status: AC
  Administered 2014-12-23: 1500 mg via INTRAVENOUS
  Filled 2014-12-22: qty 1500

## 2014-12-23 ENCOUNTER — Inpatient Hospital Stay (HOSPITAL_COMMUNITY): Payer: BC Managed Care – PPO | Admitting: Certified Registered Nurse Anesthetist

## 2014-12-23 ENCOUNTER — Encounter (HOSPITAL_COMMUNITY)
Admission: RE | Disposition: A | Discharge status: Home Health | Payer: Self-pay | Source: Ambulatory Visit | Attending: Orthopedic Surgery

## 2014-12-23 ENCOUNTER — Encounter (HOSPITAL_COMMUNITY): Payer: Self-pay | Admitting: *Deleted

## 2014-12-23 ENCOUNTER — Inpatient Hospital Stay (HOSPITAL_COMMUNITY)
Admission: RE | Admit: 2014-12-23 | Discharge: 2014-12-25 | DRG: 470 | Disposition: A | Discharge status: Home Health | Payer: BC Managed Care – PPO | Source: Ambulatory Visit | Attending: Orthopedic Surgery | Admitting: Orthopedic Surgery

## 2014-12-23 DIAGNOSIS — I10 Essential (primary) hypertension: Secondary | ICD-10-CM | POA: Diagnosis present

## 2014-12-23 DIAGNOSIS — Z96652 Presence of left artificial knee joint: Secondary | ICD-10-CM | POA: Diagnosis present

## 2014-12-23 DIAGNOSIS — K219 Gastro-esophageal reflux disease without esophagitis: Secondary | ICD-10-CM | POA: Diagnosis present

## 2014-12-23 DIAGNOSIS — Z7982 Long term (current) use of aspirin: Secondary | ICD-10-CM | POA: Diagnosis not present

## 2014-12-23 DIAGNOSIS — M1711 Unilateral primary osteoarthritis, right knee: Secondary | ICD-10-CM | POA: Diagnosis present

## 2014-12-23 DIAGNOSIS — J4520 Mild intermittent asthma, uncomplicated: Secondary | ICD-10-CM | POA: Diagnosis present

## 2014-12-23 DIAGNOSIS — M25561 Pain in right knee: Secondary | ICD-10-CM | POA: Diagnosis present

## 2014-12-23 DIAGNOSIS — Z6841 Body Mass Index (BMI) 40.0 and over, adult: Secondary | ICD-10-CM | POA: Diagnosis not present

## 2014-12-23 DIAGNOSIS — Z79899 Other long term (current) drug therapy: Secondary | ICD-10-CM | POA: Diagnosis not present

## 2014-12-23 DIAGNOSIS — G4730 Sleep apnea, unspecified: Secondary | ICD-10-CM | POA: Diagnosis present

## 2014-12-23 DIAGNOSIS — Z96659 Presence of unspecified artificial knee joint: Secondary | ICD-10-CM

## 2014-12-23 HISTORY — PX: TOTAL KNEE ARTHROPLASTY: SHX125

## 2014-12-23 LAB — TYPE AND SCREEN
ABO/RH(D): A POS
Antibody Screen: NEGATIVE

## 2014-12-23 SURGERY — ARTHROPLASTY, KNEE, TOTAL
Anesthesia: General | Laterality: Right

## 2014-12-23 MED ORDER — METOCLOPRAMIDE HCL 5 MG/ML IJ SOLN
INTRAMUSCULAR | Status: DC | PRN
  Administered 2014-12-23: 10 mg via INTRAVENOUS

## 2014-12-23 MED ORDER — EPHEDRINE SULFATE 50 MG/ML IJ SOLN
INTRAMUSCULAR | Status: DC | PRN
  Administered 2014-12-23: 10 mg via INTRAVENOUS
  Administered 2014-12-23: 15 mg via INTRAVENOUS
  Administered 2014-12-23: 20 mg via INTRAVENOUS
  Administered 2014-12-23: 5 mg via INTRAVENOUS

## 2014-12-23 MED ORDER — DEXAMETHASONE SODIUM PHOSPHATE 4 MG/ML IJ SOLN
INTRAMUSCULAR | Status: DC | PRN
  Administered 2014-12-23: 10 mg via INTRAVENOUS

## 2014-12-23 MED ORDER — CHLORHEXIDINE GLUCONATE 4 % EX LIQD: 60.0000 mL | Freq: Once | CUTANEOUS | Status: DC

## 2014-12-23 MED ORDER — HYDROMORPHONE HCL 1 MG/ML IJ SOLN
1.0000 mg | INTRAMUSCULAR | Status: DC | PRN
  Administered 2014-12-23 – 2014-12-24 (×6): 1 mg via INTRAVENOUS
  Filled 2014-12-23 (×6): qty 1

## 2014-12-23 MED ORDER — POLYETHYLENE GLYCOL 3350 17 G PO PACK: 17.0000 g | PACK | Freq: Every day | ORAL | Status: DC | PRN

## 2014-12-23 MED ORDER — ZOLPIDEM TARTRATE 5 MG PO TABS
5.0000 mg | ORAL_TABLET | Freq: Every evening | ORAL | Status: DC | PRN
  Administered 2014-12-23: 5 mg via ORAL
  Filled 2014-12-23: qty 1

## 2014-12-23 MED ORDER — GLYCOPYRROLATE 0.2 MG/ML IJ SOLN
INTRAMUSCULAR | Status: DC | PRN
  Administered 2014-12-23: 0.4 mg via INTRAVENOUS

## 2014-12-23 MED ORDER — DIPHENHYDRAMINE HCL 50 MG/ML IJ SOLN
INTRAMUSCULAR | Status: AC
  Filled 2014-12-23: qty 1

## 2014-12-23 MED ORDER — LACTATED RINGERS IV SOLN
INTRAVENOUS | Status: DC
  Administered 2014-12-23 – 2014-12-24 (×3): via INTRAVENOUS

## 2014-12-23 MED ORDER — MIDAZOLAM HCL 2 MG/2ML IJ SOLN
INTRAMUSCULAR | Status: AC
  Filled 2014-12-23: qty 2

## 2014-12-23 MED ORDER — KETAMINE HCL 10 MG/ML IJ SOLN
INTRAMUSCULAR | Status: DC | PRN
  Administered 2014-12-23: 50 mg via INTRAVENOUS

## 2014-12-23 MED ORDER — ONDANSETRON HCL 4 MG/2ML IJ SOLN
INTRAMUSCULAR | Status: AC
  Filled 2014-12-23: qty 2

## 2014-12-23 MED ORDER — SODIUM CHLORIDE 0.9 % IR SOLN
Status: DC | PRN
  Administered 2014-12-23: 1000 mL

## 2014-12-23 MED ORDER — NEOSTIGMINE METHYLSULFATE 10 MG/10ML IV SOLN
INTRAVENOUS | Status: DC | PRN
  Administered 2014-12-23: 3 mg via INTRAVENOUS

## 2014-12-23 MED ORDER — BUDESONIDE-FORMOTEROL FUMARATE 160-4.5 MCG/ACT IN AERO
1.0000 | INHALATION_SPRAY | Freq: Two times a day (BID) | RESPIRATORY_TRACT | Status: DC
  Administered 2014-12-23 – 2014-12-25 (×4): 1 via RESPIRATORY_TRACT
  Filled 2014-12-23: qty 6

## 2014-12-23 MED ORDER — HYDROMORPHONE HCL 1 MG/ML IJ SOLN
0.5000 mg | INTRAMUSCULAR | Status: AC | PRN
  Administered 2014-12-23 (×4): 0.5 mg via INTRAVENOUS

## 2014-12-23 MED ORDER — LACTATED RINGERS IV SOLN
INTRAVENOUS | Status: DC
  Administered 2014-12-23 (×3): via INTRAVENOUS

## 2014-12-23 MED ORDER — ALBUTEROL SULFATE (2.5 MG/3ML) 0.083% IN NEBU: 3.0000 mL | INHALATION_SOLUTION | Freq: Four times a day (QID) | RESPIRATORY_TRACT | Status: DC | PRN

## 2014-12-23 MED ORDER — SODIUM CHLORIDE 0.9 % IJ SOLN
INTRAMUSCULAR | Status: AC
  Filled 2014-12-23: qty 50

## 2014-12-23 MED ORDER — CISATRACURIUM BESYLATE 20 MG/10ML IV SOLN
INTRAVENOUS | Status: AC
  Filled 2014-12-23: qty 10

## 2014-12-23 MED ORDER — LIDOCAINE HCL (CARDIAC) 20 MG/ML IV SOLN
INTRAVENOUS | Status: AC
  Filled 2014-12-23: qty 5

## 2014-12-23 MED ORDER — ALUM & MAG HYDROXIDE-SIMETH 200-200-20 MG/5ML PO SUSP: 30.0000 mL | ORAL | Status: DC | PRN

## 2014-12-23 MED ORDER — LIDOCAINE HCL (CARDIAC) 20 MG/ML IV SOLN
INTRAVENOUS | Status: DC | PRN
  Administered 2014-12-23: 50 mg via INTRAVENOUS

## 2014-12-23 MED ORDER — PHENOL 1.4 % MT LIQD: 1.0000 | OROMUCOSAL | Status: DC | PRN

## 2014-12-23 MED ORDER — FENTANYL CITRATE (PF) 250 MCG/5ML IJ SOLN
INTRAMUSCULAR | Status: AC
  Filled 2014-12-23: qty 5

## 2014-12-23 MED ORDER — HYDROMORPHONE HCL 1 MG/ML IJ SOLN
INTRAMUSCULAR | Status: DC | PRN
  Administered 2014-12-23 (×2): 1 mg via INTRAVENOUS

## 2014-12-23 MED ORDER — BUPIVACAINE LIPOSOME 1.3 % IJ SUSP
20.0000 mL | Freq: Once | INTRAMUSCULAR | Status: DC
  Filled 2014-12-23: qty 20

## 2014-12-23 MED ORDER — GLYCOPYRROLATE 0.2 MG/ML IJ SOLN
INTRAMUSCULAR | Status: AC
  Filled 2014-12-23: qty 2

## 2014-12-23 MED ORDER — HYDROMORPHONE HCL 1 MG/ML IJ SOLN
INTRAMUSCULAR | Status: AC
Start: 2014-12-23 — End: 2014-12-24
  Filled 2014-12-23: qty 1

## 2014-12-23 MED ORDER — BUPIVACAINE-EPINEPHRINE (PF) 0.5% -1:200000 IJ SOLN
INTRAMUSCULAR | Status: AC
  Filled 2014-12-23: qty 30

## 2014-12-23 MED ORDER — FLEET ENEMA 7-19 GM/118ML RE ENEM: 1.0000 | ENEMA | Freq: Once | RECTAL | Status: AC | PRN

## 2014-12-23 MED ORDER — THROMBIN 5000 UNITS EX SOLR
OROMUCOSAL | Status: DC | PRN
  Administered 2014-12-23: 5 mL via TOPICAL

## 2014-12-23 MED ORDER — OXYCODONE-ACETAMINOPHEN 5-325 MG PO TABS
2.0000 | ORAL_TABLET | ORAL | Status: DC | PRN
  Administered 2014-12-23 – 2014-12-25 (×9): 2 via ORAL
  Filled 2014-12-23 (×9): qty 2

## 2014-12-23 MED ORDER — KETAMINE HCL 10 MG/ML IJ SOLN
INTRAMUSCULAR | Status: AC
  Filled 2014-12-23: qty 1

## 2014-12-23 MED ORDER — METHOCARBAMOL 1000 MG/10ML IJ SOLN
500.0000 mg | Freq: Four times a day (QID) | INTRAMUSCULAR | Status: DC | PRN
  Administered 2014-12-23: 500 mg via INTRAVENOUS
  Filled 2014-12-23 (×2): qty 5

## 2014-12-23 MED ORDER — POLYVINYL ALCOHOL 1.4 % OP SOLN: 2.0000 [drp] | OPHTHALMIC | Status: DC | PRN

## 2014-12-23 MED ORDER — BUPIVACAINE LIPOSOME 1.3 % IJ SUSP
INTRAMUSCULAR | Status: DC | PRN
  Administered 2014-12-23: 20 mL

## 2014-12-23 MED ORDER — SCOPOLAMINE 1 MG/3DAYS TD PT72
MEDICATED_PATCH | TRANSDERMAL | Status: DC | PRN
  Administered 2014-12-23: 1 via TRANSDERMAL

## 2014-12-23 MED ORDER — ONDANSETRON HCL 4 MG/2ML IJ SOLN
INTRAMUSCULAR | Status: DC | PRN
Start: 2014-12-23 — End: 2014-12-23
  Administered 2014-12-23: 4 mg via INTRAVENOUS

## 2014-12-23 MED ORDER — VANCOMYCIN HCL IN DEXTROSE 1-5 GM/200ML-% IV SOLN
1000.0000 mg | Freq: Two times a day (BID) | INTRAVENOUS | Status: AC
  Administered 2014-12-23: 1000 mg via INTRAVENOUS
  Filled 2014-12-23: qty 200

## 2014-12-23 MED ORDER — HYDROMORPHONE HCL 2 MG/ML IJ SOLN
INTRAMUSCULAR | Status: AC
  Filled 2014-12-23: qty 1

## 2014-12-23 MED ORDER — FERROUS SULFATE 325 (65 FE) MG PO TABS
325.0000 mg | ORAL_TABLET | Freq: Three times a day (TID) | ORAL | Status: DC
  Administered 2014-12-24 – 2014-12-25 (×4): 325 mg via ORAL
  Filled 2014-12-23 (×8): qty 1

## 2014-12-23 MED ORDER — SODIUM CHLORIDE 0.9 % IJ SOLN
INTRAMUSCULAR | Status: DC | PRN
  Administered 2014-12-23: 20 mL

## 2014-12-23 MED ORDER — DEXAMETHASONE SODIUM PHOSPHATE 10 MG/ML IJ SOLN
INTRAMUSCULAR | Status: AC
  Filled 2014-12-23: qty 1

## 2014-12-23 MED ORDER — SUCCINYLCHOLINE CHLORIDE 20 MG/ML IJ SOLN
INTRAMUSCULAR | Status: DC | PRN
  Administered 2014-12-23: 100 mg via INTRAVENOUS

## 2014-12-23 MED ORDER — TRANEXAMIC ACID 1000 MG/10ML IV SOLN
1000.0000 mg | INTRAVENOUS | Status: AC
  Administered 2014-12-23: 1000 mg via INTRAVENOUS
  Filled 2014-12-23: qty 10

## 2014-12-23 MED ORDER — PROPOFOL 10 MG/ML IV BOLUS
INTRAVENOUS | Status: DC | PRN
  Administered 2014-12-23: 180 mg via INTRAVENOUS

## 2014-12-23 MED ORDER — SCOPOLAMINE 1 MG/3DAYS TD PT72
MEDICATED_PATCH | TRANSDERMAL | Status: AC
  Filled 2014-12-23: qty 1

## 2014-12-23 MED ORDER — METHOCARBAMOL 500 MG PO TABS
500.0000 mg | ORAL_TABLET | Freq: Four times a day (QID) | ORAL | Status: DC | PRN
  Administered 2014-12-23 – 2014-12-25 (×5): 500 mg via ORAL
  Filled 2014-12-23 (×5): qty 1

## 2014-12-23 MED ORDER — METOCLOPRAMIDE HCL 5 MG/ML IJ SOLN
INTRAMUSCULAR | Status: AC
  Filled 2014-12-23: qty 2

## 2014-12-23 MED ORDER — MIDAZOLAM HCL 5 MG/5ML IJ SOLN
INTRAMUSCULAR | Status: DC | PRN
  Administered 2014-12-23: 2 mg via INTRAVENOUS

## 2014-12-23 MED ORDER — CISATRACURIUM BESYLATE (PF) 10 MG/5ML IV SOLN
INTRAVENOUS | Status: DC | PRN
  Administered 2014-12-23: 4 mg via INTRAVENOUS
  Administered 2014-12-23: 6 mg via INTRAVENOUS

## 2014-12-23 MED ORDER — HYDROMORPHONE HCL 1 MG/ML IJ SOLN
INTRAMUSCULAR | Status: AC
  Administered 2014-12-24: 1 mg
  Filled 2014-12-23: qty 1

## 2014-12-23 MED ORDER — LITHIUM CARBONATE 300 MG PO CAPS
300.0000 mg | ORAL_CAPSULE | Freq: Every day | ORAL | Status: DC
Start: 2014-12-23 — End: 2014-12-25
  Administered 2014-12-23 – 2014-12-24 (×2): 300 mg via ORAL
  Filled 2014-12-23 (×3): qty 1

## 2014-12-23 MED ORDER — PRAVASTATIN SODIUM 40 MG PO TABS: 40.0000 mg | ORAL_TABLET | Freq: Every day | ORAL | Status: DC

## 2014-12-23 MED ORDER — LISINOPRIL 20 MG PO TABS
20.0000 mg | ORAL_TABLET | Freq: Every day | ORAL | Status: DC
Start: 2014-12-24 — End: 2014-12-25
  Filled 2014-12-23 (×2): qty 1

## 2014-12-23 MED ORDER — ACETAMINOPHEN 650 MG RE SUPP: 650.0000 mg | Freq: Four times a day (QID) | RECTAL | Status: DC | PRN

## 2014-12-23 MED ORDER — NEOSTIGMINE METHYLSULFATE 10 MG/10ML IV SOLN
INTRAVENOUS | Status: AC
  Filled 2014-12-23: qty 1

## 2014-12-23 MED ORDER — ONDANSETRON HCL 4 MG/2ML IJ SOLN: 4.0000 mg | Freq: Four times a day (QID) | INTRAMUSCULAR | Status: DC | PRN

## 2014-12-23 MED ORDER — RIVAROXABAN 10 MG PO TABS
10.0000 mg | ORAL_TABLET | Freq: Every day | ORAL | Status: DC
  Administered 2014-12-24 – 2014-12-25 (×2): 10 mg via ORAL
  Filled 2014-12-23 (×3): qty 1

## 2014-12-23 MED ORDER — FENTANYL CITRATE (PF) 100 MCG/2ML IJ SOLN
INTRAMUSCULAR | Status: DC | PRN
  Administered 2014-12-23: 50 ug via INTRAVENOUS
  Administered 2014-12-23: 25 ug via INTRAVENOUS
  Administered 2014-12-23: 100 ug via INTRAVENOUS
  Administered 2014-12-23: 25 ug via INTRAVENOUS
  Administered 2014-12-23: 50 ug via INTRAVENOUS

## 2014-12-23 MED ORDER — STERILE WATER FOR IRRIGATION IR SOLN
Status: DC | PRN
  Administered 2014-12-23: 1000 mL

## 2014-12-23 MED ORDER — ONDANSETRON HCL 4 MG PO TABS
4.0000 mg | ORAL_TABLET | Freq: Four times a day (QID) | ORAL | Status: DC | PRN
  Administered 2014-12-25: 4 mg via ORAL
  Filled 2014-12-23: qty 1

## 2014-12-23 MED ORDER — ACETAMINOPHEN 325 MG PO TABS
650.0000 mg | ORAL_TABLET | Freq: Four times a day (QID) | ORAL | Status: DC | PRN
  Administered 2014-12-24: 650 mg via ORAL
  Filled 2014-12-23: qty 2

## 2014-12-23 MED ORDER — HYDROCODONE-ACETAMINOPHEN 5-325 MG PO TABS
1.0000 | ORAL_TABLET | ORAL | Status: DC | PRN
Start: 2014-12-23 — End: 2014-12-25
  Administered 2014-12-24 (×2): 1 via ORAL
  Filled 2014-12-23 (×2): qty 1

## 2014-12-23 MED ORDER — SODIUM CHLORIDE 0.9 % IR SOLN
Status: AC
  Filled 2014-12-23: qty 1

## 2014-12-23 MED ORDER — THROMBIN 5000 UNITS EX SOLR
CUTANEOUS | Status: AC
  Filled 2014-12-23: qty 5000

## 2014-12-23 MED ORDER — EPHEDRINE SULFATE 50 MG/ML IJ SOLN
INTRAMUSCULAR | Status: AC
  Filled 2014-12-23: qty 1

## 2014-12-23 MED ORDER — AMLODIPINE BESYLATE 10 MG PO TABS
10.0000 mg | ORAL_TABLET | Freq: Every morning | ORAL | Status: DC
  Filled 2014-12-23 (×2): qty 1

## 2014-12-23 MED ORDER — MENTHOL 3 MG MT LOZG: 1.0000 | LOZENGE | OROMUCOSAL | Status: DC | PRN

## 2014-12-23 MED ORDER — SERTRALINE HCL 100 MG PO TABS
100.0000 mg | ORAL_TABLET | Freq: Two times a day (BID) | ORAL | Status: DC
  Administered 2014-12-23 – 2014-12-25 (×4): 100 mg via ORAL
  Filled 2014-12-23 (×6): qty 1

## 2014-12-23 MED ORDER — ONDANSETRON HCL 4 MG/2ML IJ SOLN
4.0000 mg | Freq: Once | INTRAMUSCULAR | Status: AC | PRN
  Administered 2014-12-23: 4 mg via INTRAVENOUS

## 2014-12-23 MED ORDER — BISACODYL 5 MG PO TBEC: 5.0000 mg | DELAYED_RELEASE_TABLET | Freq: Every day | ORAL | Status: DC | PRN

## 2014-12-23 MED ORDER — BUPIVACAINE-EPINEPHRINE 0.5% -1:200000 IJ SOLN
INTRAMUSCULAR | Status: DC | PRN
  Administered 2014-12-23: 10 mL

## 2014-12-23 MED ORDER — SODIUM CHLORIDE 0.9 % IR SOLN
Status: DC | PRN
  Administered 2014-12-23: 500 mL

## 2014-12-23 MED ORDER — PRAVASTATIN SODIUM 40 MG PO TABS
40.0000 mg | ORAL_TABLET | Freq: Every day | ORAL | Status: DC
  Administered 2014-12-23 – 2014-12-24 (×2): 40 mg via ORAL
  Filled 2014-12-23 (×3): qty 1

## 2014-12-23 MED ORDER — PROPOFOL 10 MG/ML IV BOLUS
INTRAVENOUS | Status: AC
  Filled 2014-12-23: qty 20

## 2014-12-23 MED ORDER — MONTELUKAST SODIUM 10 MG PO TABS
10.0000 mg | ORAL_TABLET | Freq: Every day | ORAL | Status: DC
  Administered 2014-12-23 – 2014-12-24 (×2): 10 mg via ORAL
  Filled 2014-12-23 (×3): qty 1

## 2014-12-23 SURGICAL SUPPLY — 83 items
APL SKNCLS STERI-STRIP NONHPOA (GAUZE/BANDAGES/DRESSINGS) ×1
BAG DECANTER FOR FLEXI CONT (MISCELLANEOUS) ×2 IMPLANT
BAG SPEC THK2 15X12 ZIP CLS (MISCELLANEOUS) ×1
BAG ZIPLOCK 12X15 (MISCELLANEOUS) ×1 IMPLANT
BANDAGE ELASTIC 4 VELCRO ST LF (GAUZE/BANDAGES/DRESSINGS) ×2 IMPLANT
BANDAGE ELASTIC 6 VELCRO ST LF (GAUZE/BANDAGES/DRESSINGS) ×2 IMPLANT
BANDAGE ESMARK 6X9 LF (GAUZE/BANDAGES/DRESSINGS) ×1 IMPLANT
BENZOIN TINCTURE PRP APPL 2/3 (GAUZE/BANDAGES/DRESSINGS) ×1 IMPLANT
BLADE SAG 18X100X1.27 (BLADE) ×2 IMPLANT
BLADE SAW SGTL 11.0X1.19X90.0M (BLADE) ×2 IMPLANT
BNDG CMPR 9X6 STRL LF SNTH (GAUZE/BANDAGES/DRESSINGS) ×1
BNDG ESMARK 6X9 LF (GAUZE/BANDAGES/DRESSINGS) ×2
BNDG GAUZE ELAST 4 BULKY (GAUZE/BANDAGES/DRESSINGS) ×1 IMPLANT
BONE CEMENT GENTAMICIN (Cement) ×4 IMPLANT
CAP KNEE TOTAL 3 SIGMA ×1 IMPLANT
CEMENT BONE GENTAMICIN 40 (Cement) ×2 IMPLANT
CLOSURE STERI-STRIP 1/4X4 (GAUZE/BANDAGES/DRESSINGS) ×1 IMPLANT
CUFF TOURN SGL QUICK 34 (TOURNIQUET CUFF) ×2
CUFF TRNQT CYL 34X4X40X1 (TOURNIQUET CUFF) ×1 IMPLANT
DRAPE EXTREMITY T 121X128X90 (DRAPE) ×2 IMPLANT
DRAPE INCISE IOBAN 66X45 STRL (DRAPES) IMPLANT
DRAPE POUCH INSTRU U-SHP 10X18 (DRAPES) ×2 IMPLANT
DRAPE SHEET LG 3/4 BI-LAMINATE (DRAPES) ×2 IMPLANT
DRAPE U-SHAPE 47X51 STRL (DRAPES) ×2 IMPLANT
DRSG AQUACEL AG ADV 3.5X10 (GAUZE/BANDAGES/DRESSINGS) ×2 IMPLANT
DRSG PAD ABDOMINAL 8X10 ST (GAUZE/BANDAGES/DRESSINGS) ×1 IMPLANT
DRSG TEGADERM 4X4.75 (GAUZE/BANDAGES/DRESSINGS) ×2 IMPLANT
DURAPREP 26ML APPLICATOR (WOUND CARE) ×2 IMPLANT
ELECT BLADE TIP CTD 4 INCH (ELECTRODE) ×1 IMPLANT
ELECT NDL TIP 2.8 STRL (NEEDLE) IMPLANT
ELECT NEEDLE TIP 2.8 STRL (NEEDLE) ×2 IMPLANT
ELECT REM PT RETURN 9FT ADLT (ELECTROSURGICAL) ×2
ELECTRODE REM PT RTRN 9FT ADLT (ELECTROSURGICAL) ×1 IMPLANT
EVACUATOR 1/8 PVC DRAIN (DRAIN) ×2 IMPLANT
FACESHIELD WRAPAROUND (MASK) ×10 IMPLANT
FACESHIELD WRAPAROUND OR TEAM (MASK) ×5 IMPLANT
GAUZE SPONGE 2X2 8PLY STRL LF (GAUZE/BANDAGES/DRESSINGS) ×1 IMPLANT
GLOVE BIOGEL PI IND STRL 6.5 (GLOVE) ×1 IMPLANT
GLOVE BIOGEL PI IND STRL 8 (GLOVE) ×1 IMPLANT
GLOVE BIOGEL PI INDICATOR 6.5 (GLOVE) ×1
GLOVE BIOGEL PI INDICATOR 8 (GLOVE) ×1
GLOVE ECLIPSE 8.0 STRL XLNG CF (GLOVE) ×4 IMPLANT
GLOVE SURG SS PI 6.5 STRL IVOR (GLOVE) ×2 IMPLANT
GOWN STRL REUS W/TWL LRG LVL3 (GOWN DISPOSABLE) ×2 IMPLANT
GOWN STRL REUS W/TWL XL LVL3 (GOWN DISPOSABLE) ×2 IMPLANT
HANDPIECE INTERPULSE COAX TIP (DISPOSABLE) ×2
IMMOBILIZER KNEE 20 (SOFTGOODS) ×2
IMMOBILIZER KNEE 20 THIGH 36 (SOFTGOODS) ×1 IMPLANT
KIT BASIN OR (CUSTOM PROCEDURE TRAY) ×2 IMPLANT
LIQUID BAND (GAUZE/BANDAGES/DRESSINGS) ×2 IMPLANT
MANIFOLD NEPTUNE II (INSTRUMENTS) ×2 IMPLANT
NDL SAFETY ECLIPSE 18X1.5 (NEEDLE) ×2 IMPLANT
NEEDLE HYPO 18GX1.5 SHARP (NEEDLE) ×4
NEEDLE HYPO 22GX1.5 SAFETY (NEEDLE) ×2 IMPLANT
NS IRRIG 1000ML POUR BTL (IV SOLUTION) ×1 IMPLANT
PACK TOTAL JOINT (CUSTOM PROCEDURE TRAY) ×2 IMPLANT
PADDING CAST COTTON 6X4 STRL (CAST SUPPLIES) ×1 IMPLANT
PEN SKIN MARKING BROAD (MISCELLANEOUS) ×2 IMPLANT
POSITIONER SURGICAL ARM (MISCELLANEOUS) ×2 IMPLANT
SET HNDPC FAN SPRY TIP SCT (DISPOSABLE) ×1 IMPLANT
SET PAD KNEE POSITIONER (MISCELLANEOUS) ×2 IMPLANT
SPONGE GAUZE 2X2 STER 10/PKG (GAUZE/BANDAGES/DRESSINGS) ×1
SPONGE LAP 18X18 X RAY DECT (DISPOSABLE) IMPLANT
SPONGE SURGIFOAM ABS GEL 100 (HEMOSTASIS) ×2 IMPLANT
STAPLER VISISTAT 35W (STAPLE) IMPLANT
SUCTION FRAZIER 12FR DISP (SUCTIONS) ×2 IMPLANT
SUT BONE WAX W31G (SUTURE) ×2 IMPLANT
SUT MNCRL AB 4-0 PS2 18 (SUTURE) ×2 IMPLANT
SUT VIC AB 1 CT1 27 (SUTURE) ×4
SUT VIC AB 1 CT1 27XBRD ANTBC (SUTURE) ×2 IMPLANT
SUT VIC AB 2-0 CT1 27 (SUTURE) ×6
SUT VIC AB 2-0 CT1 TAPERPNT 27 (SUTURE) ×3 IMPLANT
SUT VLOC 180 0 24IN GS25 (SUTURE) ×2 IMPLANT
SYR 20CC LL (SYRINGE) ×4 IMPLANT
SYR 50ML LL SCALE MARK (SYRINGE) ×2 IMPLANT
TOWEL OR 17X26 10 PK STRL BLUE (TOWEL DISPOSABLE) ×2 IMPLANT
TOWEL OR NON WOVEN STRL DISP B (DISPOSABLE) IMPLANT
TOWER CARTRIDGE SMART MIX (DISPOSABLE) ×2 IMPLANT
TRAY FOLEY W/METER SILVER 14FR (SET/KITS/TRAYS/PACK) ×2 IMPLANT
TRAY REVISION SZ 2.5 (Knees) ×1 IMPLANT
WATER STERILE IRR 1500ML POUR (IV SOLUTION) ×2 IMPLANT
WRAP KNEE MAXI GEL POST OP (GAUZE/BANDAGES/DRESSINGS) ×2 IMPLANT
YANKAUER SUCT BULB TIP 10FT TU (MISCELLANEOUS) ×2 IMPLANT

## 2014-12-23 NOTE — Anesthesia Postprocedure Evaluation (Signed)
  Anesthesia Post-op Note  Patient: [name]  Procedure(s) Performed: Procedure(s): TOTAL KNEE ARTHROPLASTY (Right)  Patient Location: PACU  Anesthesia Type:General  Level of Consciousness: awake, alert , oriented and patient cooperative  Airway and Oxygen Therapy: Patient Spontanous Breathing  Post-op Pain: mild, moderate  Post-op Assessment: Post-op Vital signs reviewed, Patient's Cardiovascular Status Stable, Respiratory Function Stable, Patent Airway, No signs of Nausea or vomiting and Pain level controlled  Post-op Vital Signs: stable  Last Vitals:  Filed Vitals:   12/23/14 1500  BP: 137/60  Pulse: 122  Temp: 37.2 C  Resp: 16    Complications: No apparent anesthesia complications

## 2014-12-23 NOTE — Brief Op Note (Signed)
12/23/2014  2:26 PM  PATIENT:  [name]  54 y.o. female  PRE-OPERATIVE DIAGNOSIS: Primary  oa right knee and Morbid Obesity  POST-OPERATIVE DIAGNOSIS:Primary  Osteoarthritis right knee and Morbid Obesity.  PROCEDURE:  Procedure(s): TOTAL KNEE ARTHROPLASTY (Right) with Revision Tibial Stem.  SURGEON:  Surgeon(s) and Role:    * Ranee Gosselinonald Jeriah Corkum, MD - Primary  PHYSICIAN ASSISTANT: Dimitri PedAmber Constable PA  ASSISTANTS: Dimitri PedAmber Constable PA   ANESTHESIA:   general  EBL:  Total I/O In: 1100 [I.V.:1100] Out: 300 [Urine:300]  BLOOD ADMINISTERED:none  DRAINS: (ONE) Hemovact drain(s) in the Right Knee with  Suction Open   LOCAL MEDICATIONS USED:  MARCAINE 20cc of 0.50% mixed with 20cc of Normal Saline    SPECIMEN:  No Specimen  DISPOSITION OF SPECIMEN:  N/A  COUNTS:  YES  TOURNIQUET:  * Missing tourniquet times found for documented tourniquets in log:  [id number] *  DICTATION: .Other Dictation: Dictation Number [id number]  PLAN OF CARE: Admit to inpatient   PATIENT DISPOSITION:  Stable IN OR   Delay start of Pharmacological VTE agent (>24hrs) due to surgical blood loss or risk of bleeding: yes

## 2014-12-23 NOTE — Transfer of Care (Signed)
Immediate Anesthesia Transfer of Care Note  Patient: [name]  Procedure(s) Performed: Procedure(s): TOTAL KNEE ARTHROPLASTY (Right)  Patient Location: PACU  Anesthesia Type:General  Level of Consciousness: Patient easily awoken, sedated, comfortable, cooperative, following commands, responds to stimulation.   Airway & Oxygen Therapy: Patient spontaneously breathing, ventilating well, oxygen via simple oxygen mask.  Post-op Assessment: Report given to PACU RN, vital signs reviewed and stable, moving all extremities.   Post vital signs: Reviewed and stable.  Complications: No apparent anesthesia complications

## 2014-12-23 NOTE — Anesthesia Preprocedure Evaluation (Signed)
Anesthesia Evaluation  Patient identified by MRN, date of birth, ID band Patient awake    Reviewed: Allergy & Precautions, NPO status , Patient's Chart, lab work & pertinent test results  History of Anesthesia Complications (+) PONV  Airway Mallampati: II       Dental   Pulmonary asthma , sleep apnea ,    Pulmonary exam normal       Cardiovascular hypertension, Normal cardiovascular exam    Neuro/Psych  Headaches, Anxiety Depression    GI/Hepatic GERD-  ,  Endo/Other  Morbid obesity  Renal/GU      Musculoskeletal  (+) Arthritis -,   Abdominal   Peds  Hematology  (+) anemia ,   Anesthesia Other Findings   Reproductive/Obstetrics                             Anesthesia Physical Anesthesia Plan  ASA: III  Anesthesia Plan: General   Post-op Pain Management:    Induction: Intravenous  Airway Management Planned: Oral ETT  Additional Equipment:   Intra-op Plan:   Post-operative Plan: Extubation in OR  Informed Consent: I have reviewed the patients History and Physical, chart, labs and discussed the procedure including the risks, benefits and alternatives for the proposed anesthesia with the patient or authorized representative who has indicated his/her understanding and acceptance.     Plan Discussed with: CRNA, Anesthesiologist and Surgeon  Anesthesia Plan Comments:         Anesthesia Quick Evaluation

## 2014-12-23 NOTE — Anesthesia Procedure Notes (Signed)
Procedure Name: Intubation Date/Time: 12/23/2014 12:18 PM Performed by: Ludwig LeanJONES, Kanton Kamel C Pre-anesthesia Checklist: Patient identified, Emergency Drugs available, Suction available and Patient being monitored Patient Re-evaluated:Patient Re-evaluated prior to inductionOxygen Delivery Method: Circle System Utilized Preoxygenation: Pre-oxygenation with 100% oxygen Intubation Type: IV induction Ventilation: Mask ventilation without difficulty Laryngoscope Size: Mac and 3 Grade View: Grade I Tube type: Oral Tube size: 7.5 mm Number of attempts: 1 Airway Equipment and Method: Oral airway Placement Confirmation: ETT inserted through vocal cords under direct vision,  positive ETCO2 and breath sounds checked- equal and bilateral Secured at: 21 cm Tube secured with: Tape Dental Injury: Teeth and Oropharynx as per pre-operative assessment

## 2014-12-23 NOTE — Interval H&P Note (Signed)
History and Physical Interval Note:  12/23/2014 11:47 AM  [name]  has presented today for surgery, with the diagnosis of oa right knee  The various methods of treatment have been discussed with the patient and family. After consideration of risks, benefits and other options for treatment, the patient has consented to  Procedure(s): TOTAL KNEE ARTHROPLASTY (Right) as a surgical intervention .  The patient's history has been reviewed, patient examined, no change in status, stable for surgery.  I have reviewed the patient's chart and labs.  Questions were answered to the patient's satisfaction.     Zaley Talley A

## 2014-12-24 ENCOUNTER — Encounter (HOSPITAL_COMMUNITY): Payer: Self-pay | Admitting: Orthopedic Surgery

## 2014-12-24 ENCOUNTER — Ambulatory Visit: Payer: BC Managed Care – PPO | Admitting: Physician Assistant

## 2014-12-24 LAB — CBC
HCT: 34.2 % — ABNORMAL LOW (ref 36.0–46.0)
Hemoglobin: 10.9 g/dL — ABNORMAL LOW (ref 12.0–15.0)
MCH: 28.2 pg (ref 26.0–34.0)
MCHC: 31.9 g/dL (ref 30.0–36.0)
MCV: 88.6 fL (ref 78.0–100.0)
Platelets: 243 10*3/uL (ref 150–400)
RBC: 3.86 MIL/uL — ABNORMAL LOW (ref 3.87–5.11)
RDW: 15.1 % (ref 11.5–15.5)
WBC: 12.7 10*3/uL — ABNORMAL HIGH (ref 4.0–10.5)

## 2014-12-24 LAB — BASIC METABOLIC PANEL
Anion gap: 11 (ref 5–15)
BUN: 17 mg/dL (ref 6–20)
CO2: 25 mmol/L (ref 22–32)
Calcium: 9.1 mg/dL (ref 8.9–10.3)
Chloride: 99 mmol/L — ABNORMAL LOW (ref 101–111)
Creatinine, Ser: 0.92 mg/dL (ref 0.44–1.00)
GFR calc Af Amer: >60 mL/min (ref >60)
GFR calc non Af Amer: >60 mL/min (ref >60)
Glucose, Bld: 135 mg/dL — ABNORMAL HIGH (ref 65–99)
Potassium: 4.7 mmol/L (ref 3.5–5.1)
Sodium: 135 mmol/L (ref 135–145)

## 2014-12-24 MED ORDER — ZOLPIDEM TARTRATE 5 MG PO TABS
5.0000 mg | ORAL_TABLET | Freq: Every evening | ORAL | Status: DC | PRN
  Filled 2014-12-24: qty 1

## 2014-12-24 MED ORDER — RIVAROXABAN 10 MG PO TABS: 10.0000 mg | ORAL_TABLET | Freq: Every day | ORAL | Status: DC

## 2014-12-24 MED ORDER — LACTATED RINGERS IV SOLN: Freq: Once | INTRAVENOUS | Status: DC

## 2014-12-24 MED ORDER — OXYCODONE-ACETAMINOPHEN 5-325 MG PO TABS: 1.0000 | ORAL_TABLET | ORAL | Status: DC | PRN

## 2014-12-24 MED ORDER — METHOCARBAMOL 500 MG PO TABS: 500.0000 mg | ORAL_TABLET | Freq: Four times a day (QID) | ORAL | Status: DC | PRN

## 2014-12-24 MED ORDER — ZOLPIDEM TARTRATE 10 MG PO TABS: 10.0000 mg | ORAL_TABLET | Freq: Every evening | ORAL | Status: DC | PRN

## 2014-12-24 NOTE — Progress Notes (Signed)
Physical Therapy Treatment Patient Details Name: [name] MRN: [id number] DOB: 1961/04/07 Today's Date: 12/24/2014    History of Present Illness R TKR    PT Comments    Marked improvement in activity tolerance from am session.  Follow Up Recommendations  Home health PT     Equipment Recommendations  None recommended by PT    Recommendations for Other Services OT consult     Precautions / Restrictions Precautions Precautions: Fall Required Braces or Orthoses: Knee Immobilizer - Right Knee Immobilizer - Right: Discontinue once straight leg raise with < 10 degree lag Restrictions Weight Bearing Restrictions: No Other Position/Activity Restrictions: WBAT    Mobility  Bed Mobility Overal bed mobility: Needs Assistance Bed Mobility: Sit to Supine       Sit to supine: Min assist   General bed mobility comments: cues for sequence and use of L LE to self assist  Transfers Overall transfer level: Needs assistance Equipment used: Rolling walker (2 wheeled) Transfers: Sit to/from Stand Sit to Stand: Min assist;From elevated surface         General transfer comment: cues for LE management and use of UEs to self assist  Ambulation/Gait Ambulation/Gait assistance: Min assist Ambulation Distance (Feet): 69 Feet Assistive device: Rolling walker (2 wheeled) Gait Pattern/deviations: Step-to pattern;Decreased step length - right;Decreased step length - left;Shuffle;Trunk flexed Gait velocity: decr   General Gait Details: cues for sequence, posture, increased R heel contact and position from [employer]   Stairs            Wheelchair Mobility    Modified Rankin (Stroke Patients Only)       Balance                                    Cognition Arousal/Alertness: Awake/alert Behavior During Therapy: WFL for tasks assessed/performed Overall Cognitive Status: Within Functional Limits for tasks assessed                      Exercises       General Comments        Pertinent Vitals/Pain Pain Assessment: 0-10 Pain Score: 6  Pain Location: R knee Pain Descriptors / Indicators: Aching;Sore Pain Intervention(s): Premedicated before session;Monitored during session;Limited activity within patient's tolerance;Ice applied    Home Living                      Prior Function            PT Goals (current goals can now be found in the care plan section) Acute Rehab PT Goals Patient Stated Goal: Walk without pain PT Goal Formulation: With patient Time For Goal Achievement: 12/31/14 Potential to Achieve Goals: Good Progress towards PT goals: Progressing toward goals    Frequency  7X/week    PT Plan Current plan remains appropriate    Co-evaluation             End of Session Equipment Utilized During Treatment: Gait belt;Right knee immobilizer Activity Tolerance: Patient tolerated treatment well Patient left: in bed;with call bell/phone within reach;with family/visitor present     Time: [id number] PT Time Calculation (min) (ACUTE ONLY): 26 min  Charges:  $Gait Training: 23-37 mins                    G Codes:      [name] 12/24/2014, 5:15 PM

## 2014-12-24 NOTE — Care Management Note (Signed)
Case Management Note  Patient Details  Name: Alailah Safley MRN: [id number] Date of Birth: [birth year]  Subjective/Objective:                   TOTAL KNEE ARTHROPLASTY (Right) Action/Plan: Discharge planning  Expected Discharge Date:  12/25/14               Expected Discharge Plan:  [place]  In-House Referral:     Discharge planning Services  CM Consult  Post Acute Care Choice:  Home Health Choice offered to:  Patient  DME Arranged:    DME Agency:     HH Arranged:  PT [place]:  [place]  Status of Service:  Completed, signed off  Medicare Important Message Given:    Date Medicare IM Given:    Medicare IM give by:    Date Additional Medicare IM Given:    Additional Medicare Important Message give by:     If discussed at [place] of Stay Meetings, dates discussed:    Additional Comments: CM met with pt in room to offer choice of home health agency.  Pt chooses [facility] to render HHPT.  Address and contact information verified by pt.  Pt has both a rolling walker and 3n1.  Referral called to [facility] rep, Lecretia.  No other CM needs were communicated. Dellie Catholic, RN 12/24/2014, 2:39 PM

## 2014-12-24 NOTE — Op Note (Signed)
NAMJaneice Robinson:  Latner, MARY Monnie           ACCOUNT NO.:  [id number]  MEDICAL RECORD NO.:  [id number]  LOCATION:  1608                         [facility]  PHYSICIAN:  Georges Lynchonald A. Tanush Drees, M.D.DATE OF BIRTH:  1961-06-12  DATE OF PROCEDURE:  12/23/2014 DATE OF DISCHARGE:                              OPERATIVE REPORT   SURGEON:  Georges Lynchonald A. Darrelyn HillockGioffre, M.D.  OPERATIVE ASSISTANT:  Dimitri PedAmber Constable, PA.  PREOPERATIVE DIAGNOSES: 1. Morbid obesity. 2. Primary bone on bone osteoarthritis, right knee. 3. Previous benign cyst curetted out of the proximal tibia, right knee     with bone graft.  POSTOPERATIVE DIAGNOSES: 1. Morbid obesity. 2. Primary bone on bone osteoarthritis, right knee. 3. Previous benign cyst curetted out of the proximal tibia, right knee     with bone graft.  OPERATION:  Right total knee arthroplasty utilizing DePuy system.  I utilized gentamicin in the cement.  Also the size of the femoral component was a size 3 right posterior cruciate sacrificing component, tibial tray was a size 2.5.  The insert was a size 315 mm thickness insert rotating platform type.  The patella was a size 35 patella with 3 pegs.  DESCRIPTION OF PROCEDURE:  Under general anesthesia, routine orthopedic prep and draping the right lower extremity carried out.  An appropriate time-out was first carried out.  I also marked the appropriate right leg in the holding area.  She had 1.5 g of vancomycin preop.  At this time, the leg was exsanguinated and Esmarch tourniquet was elevated to 350 mmHg.  The knee was placed in a DeMayo knee holder.  Note, this happened after we exsanguinated the leg and elevated the tourniquet.  Following that, an incision was made with the knee flexed.  Two flaps were created.  At this time, the self-retaining retractors were inserted.  We then carried out a median parapatellar incision.  The patellar was reflected laterally.  I then did my medial lateral meniscectomies  and excised the anterior cruciate ligament and posterior cruciate ligaments. At this particular point, I then went on and made my initial drill hole in the intercondylar notch of the femur.  Following that, I then removed 12 mm thickness off the distal femur because of the severe contracture and obesity.  Following that, I then measured the femur to be a size 3. I did my anterior and posterior chamfering cuts for a size 3 right femoral component.  At this particular point, I then went on and prepared the tibia in the usual fashion.  I removed 4 mm thickness off the affected medial side of the tibia in the usual fashion for a revision tibial tray.  After this cut was made, we then went on and inserted our lamina spreaders.  There were no posterior spurs.  We simply did some soft tissue clean out posteriorly.  We then inserted our spacer blocks and finally selected to use a 15 mm thickness insert.  At this particular point, we then went on and completed our tibial cut for the revision tray.  The tray size was a size 2.5.  At this particular point, we then went on and did our notch cut out of the distal femur. We  then inserted our trial components.  We had a nice fit with the 50 mm thickness insert.  With the knee extended and the temporary prostheses in.  We then went on did out resurfacing procedure on the patella for a size 35 patella.  Three drill holes were made in usual fashion on the articular surface of the patella.  All trial components were removed. We thoroughly water picked out the knee dried the knee out, cemented all 3 components, and simultaneously with gentamicin in the cement.  After the cement was hardened, removed all loose pieces of cement.  We then water picked the knee out again to make sure the no loose fragments present.  After this, I then inserted my rotating platform size 350 mm thickness, reduced the knee, and had excellent stability, and excellent flexion and  extension.  We then inserted a Hemovac drain and closed the wound layers in usual fashion.  We also injected 20 mL of 0.5% Marcaine and epinephrine and soft tissue followed by a mixture of 20 mL of Exparel with 20 mL of normal saline.  The wound, as I mentioned was closed over Hemovac drain.          ______________________________ Georges Lynch. Darrelyn Hillock, M.D.     RAG/MEDQ  D:  12/23/2014  T:  12/24/2014  Job:  [id number]

## 2014-12-24 NOTE — Progress Notes (Signed)
   Subjective: 1 Day Post-Op Procedure(s) (LRB): TOTAL KNEE ARTHROPLASTY (Right) Patient reports pain as moderate.   Patient seen in rounds for Dr. Darrelyn HillockGioffre. Patient is well, and has had no acute complaints or problems other than pain in the right knee. She did not get much rest last night. No SOB or chest pain. Reports feeling tingling in her right foot.    Objective: Vital signs in last 24 hours: Temp:  [97.9 F (36.6 C)-99.1 F (37.3 C)] 98.4 F (36.9 C) (06/01 0535) Pulse Rate:  [84-122] 84 (06/01 0535) Resp:  [13-20] 16 (06/01 0535) BP: (115-157)/(53-89) 132/56 mmHg (06/01 0535) SpO2:  [95 %-100 %] 95 % (06/01 0535) Weight:  [139.708 kg (308 lb)] 139.708 kg (308 lb) (05/31 1000)  Intake/Output from previous day:  Intake/Output Summary (Last 24 hours) at 12/24/14 0729 Last data filed at 12/24/14 0537  Gross per 24 hour  Intake 3552.09 ml  Output   1445 ml  Net 2107.09 ml     Labs:  Recent Labs  12/24/14 0410  HGB 10.9*    Recent Labs  12/24/14 0410  WBC 12.7*  RBC 3.86*  HCT 34.2*  PLT 243    Recent Labs  12/24/14 0410  NA 135  K 4.7  CL 99*  CO2 25  BUN 17  CREATININE 0.92  GLUCOSE 135*  CALCIUM 9.1    EXAM General - Patient is Alert and Oriented Extremity - Neurologically intact Intact pulses distally Dorsiflexion/Plantar flexion intact No cellulitis present Compartment soft Dressing - no drainage Motor Function - intact, moving foot and toes well on exam.  Hemovac pulled without difficulty.  Past Medical History  Diagnosis Date  . Anxiety   . Depression   . Hypertension   . High cholesterol   . GERD (gastroesophageal reflux disease)   . Colon polyps   . Asthma   . PONV (postoperative nausea and vomiting)   . Arthritis   . Anemia     hx of with pregnancy   . Sleep apnea     has not use CPAP machine in 2 yrs/ DOES NOT KNOW IF SHE NEEDS TO USE C - PAP  . Headache     occasional headaches   . Shortness of breath dyspnea    due to weight gain     Assessment/Plan: 1 Day Post-Op Procedure(s) (LRB): TOTAL KNEE ARTHROPLASTY (Right) Active Problems:   History of total knee arthroplasty  Estimated body mass index is 52.84 kg/(m^2) as calculated from the following:   Height as of this encounter: 5\' 4"  (1.626 m).   Weight as of this encounter: 139.708 kg (308 lb). Advance diet Up with therapy D/C IV fluids when tolerating POs well Plan for discharge tomorrow  DVT Prophylaxis - Xarelto Weight-Bearing as tolerated  D/C O2 and Pulse OX and try on Room Air  Will add her Ambien but only 10mg . She can resume 12.5 ER tablet when she returns home. Tingling in foot appears to be secondary to swelling and pressure as sensation and motor function are intact in right LE. Therapy today. Plan for DC home tomorrow if she continues to progress.   Dimitri PedAmber Tonimarie Gritz, PA-C Orthopaedic Surgery 12/24/2014, 7:29 AM

## 2014-12-24 NOTE — Evaluation (Signed)
Physical Therapy Evaluation Patient Details Name: [name] MRN: [id number] DOB: 24-Nov-1960 Today's Date: 12/24/2014   History of Present Illness  R TKR  Clinical Impression  Pt s/p R TKR presents with decreased R LE strength/ROM and post op pain limiting functional mobility.  Pt should progress to dc home with family assist and HHPT follow up.    Follow Up Recommendations Home health PT    Equipment Recommendations  None recommended by PT    Recommendations for Other Services OT consult     Precautions / Restrictions Precautions Precautions: Fall Required Braces or Orthoses: Knee Immobilizer - Right Knee Immobilizer - Right: Discontinue once straight leg raise with < 10 degree lag Restrictions Weight Bearing Restrictions: No Other Position/Activity Restrictions: WBAT      Mobility  Bed Mobility Overal bed mobility: Needs Assistance Bed Mobility: Supine to Sit     Supine to sit: Min assist     General bed mobility comments: cues for sequence and use of L LE to self assist  Transfers Overall transfer level: Needs assistance Equipment used: Rolling walker (2 wheeled) Transfers: Sit to/from Stand Sit to Stand: Min assist;From elevated surface         General transfer comment: cues for LE management and use of UEs to self assist  Ambulation/Gait Ambulation/Gait assistance: Min assist Ambulation Distance (Feet): 19 Feet Assistive device: Rolling walker (2 wheeled) Gait Pattern/deviations: Step-to pattern;Decreased step length - right;Decreased step length - left;Shuffle;Trunk flexed Gait velocity: decr   General Gait Details: cues for sequence, posture and position from [employer]  Stairs            Wheelchair Mobility    Modified Rankin (Stroke Patients Only)       Balance                                             Pertinent Vitals/Pain Pain Assessment: 0-10 Pain Score: 6  Pain Location: R knee Pain Descriptors /  Indicators: Aching;Sore Pain Intervention(s): Limited activity within patient's tolerance;Monitored during session;Premedicated before session;Ice applied    Home Living Family/patient expects to be discharged to:: Private residence Living Arrangements: Spouse/significant other Available Help at Discharge: Family Type of Home: House Home Access: Stairs to enter Entrance Stairs-Rails: None Entrance Stairs-Number of Steps: 2 Home Layout: One level Home Equipment: Environmental consultantWalker - 2 wheels;Adaptive equipment;Bedside commode;Tub bench;Cane - quad      Prior Function Level of Independence: Independent with assistive device(s)               Hand Dominance   Dominant Hand: Right    Extremity/Trunk Assessment   Upper Extremity Assessment: Overall WFL for tasks assessed           Lower Extremity Assessment: RLE deficits/detail RLE Deficits / Details: 2/5 quads with AAROM at knee -10-  35    Cervical / Trunk Assessment: Normal  Communication   Communication: No difficulties  Cognition Arousal/Alertness: Awake/alert Behavior During Therapy: WFL for tasks assessed/performed Overall Cognitive Status: Within Functional Limits for tasks assessed                      General Comments      Exercises Total Joint Exercises Ankle Circles/Pumps: AROM;Both;15 reps;Supine Quad Sets: AROM;Both;10 reps;Supine Heel Slides: AAROM;Right;10 reps;Supine Hip ABduction/ADduction: AAROM;Right;10 reps;Supine      Assessment/Plan    PT Assessment  Patient needs continued PT services  PT Diagnosis Difficulty walking   PT Problem List Decreased strength;Decreased range of motion;Decreased activity tolerance;Decreased mobility;Decreased knowledge of use of DME;Obesity;Pain  PT Treatment Interventions DME instruction;Gait training;Stair training;Functional mobility training;Therapeutic activities;Therapeutic exercise;Patient/family education   PT Goals (Current goals can be found in the  Care Plan section) Acute Rehab PT Goals Patient Stated Goal: Walk without pain PT Goal Formulation: With patient Time For Goal Achievement: 12/31/14 Potential to Achieve Goals: Good    Frequency 7X/week   Barriers to discharge        Co-evaluation               End of Session Equipment Utilized During Treatment: Gait belt;Right knee immobilizer Activity Tolerance: Patient tolerated treatment well Patient left: in chair;with call bell/phone within reach;with family/visitor present Nurse Communication: Mobility status         Time: 1191-4782 PT Time Calculation (min) (ACUTE ONLY): 34 min   Charges:   PT Evaluation $Initial PT Evaluation Tier I: 1 Procedure PT Treatments $Therapeutic Exercise: 8-22 mins   PT G Codes:        Aryia Delira Jan 13, 2015, 2:50 PM

## 2014-12-24 NOTE — Progress Notes (Signed)
OT Cancellation Note  Patient Details Name: [name] MRN: [id number] DOB: 11-18-1960   Cancelled Treatment:    Reason Eval/Treat Not Completed: OT screened, no needs identified, will sign off. Pt states she feels comfortable with all ADL from previous knee surgeries and has all DME. Husband present and confirms he can assist at d/c and feels comfortable with techniques. Pt declines OT needs currently. Will sign off.  Lennox LaityStone, Joushua Dugar [place]  [phone] 12/24/2014, 11:20 AM

## 2014-12-24 NOTE — Discharge Instructions (Addendum)
INSTRUCTIONS AFTER JOINT REPLACEMENT   Remove items at home which could result in a fall. This includes throw rugs or furniture in walking pathways ICE to the affected joint every three hours while awake for 30 minutes at a time, for at least the first 3-5 days, and then as needed for pain and swelling.  Continue to use ice for pain and swelling. You may notice swelling that will progress down to the foot and ankle.  This is normal after surgery.  Elevate your leg when you are not up walking on it.   Continue to use the breathing machine you got in the hospital (incentive spirometer) which will help keep your temperature down.  It is common for your temperature to cycle up and down following surgery, especially at night when you are not up moving around and exerting yourself.  The breathing machine keeps your lungs expanded and your temperature down.   DIET:  As you were doing prior to hospitalization, we recommend a well-balanced diet.  DRESSING / WOUND CARE / SHOWERING  You may change your dressing daily after surgery.  Then change the dressing every day with sterile gauze.  Please use good hand washing techniques before changing the dressing.  Do not use any lotions or creams on the incision until instructed by your surgeon.  ACTIVITY  Increase activity slowly as tolerated, but follow the weight bearing instructions below.   No driving for 6 weeks or until further direction given by your physician.  You cannot drive while taking narcotics.  No lifting or carrying greater than 10 lbs. until further directed by your surgeon. Avoid periods of inactivity such as sitting longer than an hour when not asleep. This helps prevent blood clots.  You may return to work once you are authorized by your doctor.     WEIGHT BEARING   Weight bearing as tolerated with assist device (walker, cane, etc) as directed, use it as long as suggested by your surgeon or therapist, typically at least 4-6  weeks.   EXERCISES  Results after joint replacement surgery are often greatly improved when you follow the exercise, range of motion and muscle strengthening exercises prescribed by your doctor. Safety measures are also important to protect the joint from further injury. Any time any of these exercises cause you to have increased pain or swelling, decrease what you are doing until you are comfortable again and then slowly increase them. If you have problems or questions, call your caregiver or physical therapist for advice.   Rehabilitation is important following a joint replacement. After just a few days of immobilization, the muscles of the leg can become weakened and shrink (atrophy).  These exercises are designed to build up the tone and strength of the thigh and leg muscles and to improve motion. Often times heat used for twenty to thirty minutes before working out will loosen up your tissues and help with improving the range of motion but do not use heat for the first two weeks following surgery (sometimes heat can increase post-operative swelling).   These exercises can be done on a training (exercise) mat, on the floor, on a table or on a bed. Use whatever works the best and is most comfortable for you.    Use music or television while you are exercising so that the exercises are a pleasant break in your day. This will make your life better with the exercises acting as a break in your routine that you can look forward to.  Perform all exercises about fifteen times, three times per day or as directed.  You should exercise both the operative leg and the other leg as well.   Exercises include:   Quad Sets - Tighten up the muscle on the front of the thigh (Quad) and hold for 5-10 seconds.   Straight Leg Raises - With your knee straight (if you were given a brace, keep it on), lift the leg to 60 degrees, hold for 3 seconds, and slowly lower the leg.  Perform this exercise against resistance later  as your leg gets stronger.  Leg Slides: Lying on your back, slowly slide your foot toward your buttocks, bending your knee up off the floor (only go as far as is comfortable). Then slowly slide your foot back down until your leg is flat on the floor again.  Angel Wings: Lying on your back spread your legs to the side as far apart as you can without causing discomfort.  Hamstring Strength:  Lying on your back, push your heel against the floor with your leg straight by tightening up the muscles of your buttocks.  Repeat, but this time bend your knee to a comfortable angle, and push your heel against the floor.  You may put a pillow under the heel to make it more comfortable if necessary.   A rehabilitation program following joint replacement surgery can speed recovery and prevent re-injury in the future due to weakened muscles. Contact your doctor or a physical therapist for more information on knee rehabilitation.    CONSTIPATION  Constipation is defined medically as fewer than three stools per week and severe constipation as less than one stool per week.  Even if you have a regular bowel pattern at home, your normal regimen is likely to be disrupted due to multiple reasons following surgery.  Combination of anesthesia, postoperative narcotics, change in appetite and fluid intake all can affect your bowels.   YOU MUST use at least one of the following options; they are listed in order of increasing strength to get the job done.  They are all available over the counter, and you may need to use some, POSSIBLY even all of these options:    Drink plenty of fluids (prune juice may be helpful) and high fiber foods Colace 100 mg by mouth twice a day  Senokot for constipation as directed and as needed Dulcolax (bisacodyl), take with full glass of water  Miralax (polyethylene glycol) once or twice a day as needed.  If you have tried all these things and are unable to have a bowel movement in the first 3-4  days after surgery call either your surgeon or your primary doctor.    If you experience loose stools or diarrhea, hold the medications until you stool forms back up.  If your symptoms do not get better within 1 week or if they get worse, check with your doctor.  If you experience "the worst abdominal pain ever" or develop nausea or vomiting, please contact the office immediately for further recommendations for treatment.   ITCHING:  If you experience itching with your medications, try taking only a single pain pill, or even half a pain pill at a time.  You can also use Benadryl over the counter for itching or also to help with sleep.   TED HOSE STOCKINGS:  Use stockings on both legs until for at least 2 weeks or as directed by physician office. They may be removed at night for sleeping.  MEDICATIONS:  See your medication summary on the After Visit Summary that nursing will review with you.  You may have some home medications which will be placed on hold until you complete the course of blood thinner medication.  It is important for you to complete the blood thinner medication as prescribed.  PRECAUTIONS:  If you experience chest pain or shortness of breath - call 911 immediately for transfer to the hospital emergency department.   If you develop a fever greater that 101 F, purulent drainage from wound, increased redness or drainage from wound, foul odor from the wound/dressing, or calf pain - CONTACT YOUR SURGEON.                                                   FOLLOW-UP APPOINTMENTS:  If you do not already have a post-op appointment, please call the office for an appointment to be seen by your surgeon.  Guidelines for how soon to be seen are listed in your After Visit Summary, but are typically between 1-4 weeks after surgery.  OTHER INSTRUCTIONS:  Do not take vitamins or supplements while taking the blood thinner (Xarelto) for the next three weeks  MAKE SURE YOU:  Understand these  instructions.  Get help right away if you are not doing well or get worse.    Thank you for letting us be a part of your medical care team.  It is a privilege we respect greatly.  We hope these instructions will help you stay on track for a fast and full recovery!    Information on my medicine - XARELTO (Rivaroxaban)  This medication education was reviewed with me or my healthcare representative as part of my discharge preparation.  The pharmacist that spoke with me during my hospital stay was:  Clance Boll, [facility]  Why was Xarelto prescribed for you? Xarelto was prescribed for you to reduce the risk of blood clots forming after orthopedic surgery. The medical term for these abnormal blood clots is venous thromboembolism (VTE).  What do you need to know about xarelto ? Take your Xarelto ONCE DAILY at the same time every day. You may take it either with or without food.  If you have difficulty swallowing the tablet whole, you may crush it and mix in applesauce just prior to taking your dose.  Take Xarelto exactly as prescribed by your doctor and DO NOT stop taking Xarelto without talking to the doctor who prescribed the medication.  Stopping without other VTE prevention medication to take the place of Xarelto may increase your risk of developing a clot.  After discharge, you should have regular check-up appointments with your healthcare provider that is prescribing your Xarelto.    What do you do if you miss a dose? If you miss a dose, take it as soon as you remember on the same day then continue your regularly scheduled once daily regimen the next day. Do not take two doses of Xarelto on the same day.   Important Safety Information A possible side effect of Xarelto is bleeding. You should call your healthcare provider right away if you experience any of the following: ? Bleeding from an injury or your nose that does not stop. ? Unusual colored urine (red or dark brown) or  unusual colored stools (red or black). ? Unusual bruising for unknown reasons. ? A serious  fall or if you hit your head (even if there is no bleeding).  Some medicines may interact with Xarelto and might increase your risk of bleeding while on Xarelto. To help avoid this, consult your healthcare provider or pharmacist prior to using any new prescription or non-prescription medications, including herbals, vitamins, non-steroidal anti-inflammatory drugs (NSAIDs) and supplements.  This website has more information on Xarelto: [email or web address]

## 2014-12-24 NOTE — Progress Notes (Signed)
Patient complaining of right calf pain, worse on flexion. Per Dr. Darrelyn HillockGioffre, continue to monitor patient, keep SCDs on.

## 2014-12-24 NOTE — Progress Notes (Signed)
Per Dr. Darrelyn HillockGioffre, increase patient's LR infusion to 150 mls/hour for 90 minutes then to 125 mls/hour for 60 minutes then back to 100 mls/hour. Monitor BP throughout.

## 2014-12-25 LAB — BASIC METABOLIC PANEL
Anion gap: 7 (ref 5–15)
BUN: 11 mg/dL (ref 6–20)
CO2: 23 mmol/L (ref 22–32)
Calcium: 8.6 mg/dL — ABNORMAL LOW (ref 8.9–10.3)
Chloride: 103 mmol/L (ref 101–111)
Creatinine, Ser: 0.87 mg/dL (ref 0.44–1.00)
GFR calc Af Amer: >60 mL/min (ref >60)
GFR calc non Af Amer: >60 mL/min (ref >60)
Glucose, Bld: 120 mg/dL — ABNORMAL HIGH (ref 65–99)
Potassium: 4 mmol/L (ref 3.5–5.1)
Sodium: 133 mmol/L — ABNORMAL LOW (ref 135–145)

## 2014-12-25 LAB — CBC
HCT: 31.8 % — ABNORMAL LOW (ref 36.0–46.0)
Hemoglobin: 10.2 g/dL — ABNORMAL LOW (ref 12.0–15.0)
MCH: 28.1 pg (ref 26.0–34.0)
MCHC: 32.1 g/dL (ref 30.0–36.0)
MCV: 87.6 fL (ref 78.0–100.0)
Platelets: 230 10*3/uL (ref 150–400)
RBC: 3.63 MIL/uL — ABNORMAL LOW (ref 3.87–5.11)
RDW: 14.9 % (ref 11.5–15.5)
WBC: 11.1 10*3/uL — ABNORMAL HIGH (ref 4.0–10.5)

## 2014-12-25 NOTE — Progress Notes (Signed)
Physical Therapy Treatment Patient Details Name: [name] MRN: [id number] DOB: Oct 13, 1960 Today's Date: 12/25/2014    History of Present Illness R TKR    PT Comments    Pt progressing steadily with mobility.  Reviewed home therex, stairs and don/doff KI with pt and spouse.  Follow Up Recommendations  Home health PT     Equipment Recommendations  None recommended by PT    Recommendations for Other Services OT consult     Precautions / Restrictions Precautions Precautions: Fall Required Braces or Orthoses: Knee Immobilizer - Right Knee Immobilizer - Right: Discontinue once straight leg raise with < 10 degree lag Restrictions Weight Bearing Restrictions: No Other Position/Activity Restrictions: WBAT    Mobility  Bed Mobility Overal bed mobility: Needs Assistance Bed Mobility: Sit to Supine;Supine to Sit     Supine to sit: Min assist Sit to supine: Min assist   General bed mobility comments: cues for sequence and use of L LE to self assist  Transfers Overall transfer level: Needs assistance Equipment used: Rolling walker (2 wheeled) Transfers: Sit to/from Stand Sit to Stand: Min guard;From elevated surface         General transfer comment: cues for LE management and use of UEs to self assist  Ambulation/Gait Ambulation/Gait assistance: Min guard Ambulation Distance (Feet): 40 Feet Assistive device: Rolling walker (2 wheeled) Gait Pattern/deviations: Step-to pattern;Decreased step length - right;Decreased step length - left;Shuffle;Trunk flexed Gait velocity: decr   General Gait Details: Cues for posture and position from RW   Stairs Stairs: Yes Stairs assistance: Min assist Stair Management: No rails;Backwards;With walker;Step to pattern Number of Stairs: 2 General stair comments: single step twice with cues for sequence and foot/RW placement  Wheelchair Mobility    Modified Rankin (Stroke Patients Only)       Balance                                    Cognition Arousal/Alertness: Awake/alert Behavior During Therapy: WFL for tasks assessed/performed Overall Cognitive Status: Within Functional Limits for tasks assessed                      Exercises Total Joint Exercises Ankle Circles/Pumps: AROM;Both;Supine;20 reps Quad Sets: AROM;Both;Supine;20 reps Heel Slides: AAROM;Right;Supine;15 reps Straight Leg Raises: AAROM;Both;20 reps;Supine    General Comments        Pertinent Vitals/Pain Pain Assessment: 0-10 Pain Score: 5  Pain Location: R knee Pain Descriptors / Indicators: Aching;Sore Pain Intervention(s): Limited activity within patient's tolerance;Premedicated before session;Monitored during session;Ice applied    Home Living                      Prior Function            PT Goals (current goals can now be found in the care plan section) Acute Rehab PT Goals Patient Stated Goal: Walk without pain PT Goal Formulation: With patient Time For Goal Achievement: 12/31/14 Potential to Achieve Goals: Good Progress towards PT goals: Progressing toward goals    Frequency  7X/week    PT Plan Current plan remains appropriate    Co-evaluation             End of Session Equipment Utilized During Treatment: Gait belt;Right knee immobilizer Activity Tolerance: Patient tolerated treatment well Patient left: in bed;with call bell/phone within reach;with family/visitor present     Time: 0950-1030 PT Time  Calculation (min) (ACUTE ONLY): 40 min  Charges:  $Gait Training: 8-22 mins $Therapeutic Exercise: 8-22 mins $Therapeutic Activity: 8-22 mins                    G Codes:      Hezikiah Retzloff 12/25/2014, 12:38 PM

## 2014-12-25 NOTE — Progress Notes (Signed)
Subjective: 2 Days Post-Op Procedure(s) (LRB): TOTAL KNEE ARTHROPLASTY (Right) Patient reports pain as 2 on 0-10 scaleDoing well today.Will DC.    Objective: Vital signs in last 24 hours: Temp:  [97.8 F (36.6 C)-99.6 F (37.6 C)] 98.5 F (36.9 C) (06/02 0527) Pulse Rate:  [86-99] 97 (06/02 0527) Resp:  [16-22] 22 (06/02 0527) BP: (95-136)/(37-73) 135/59 mmHg (06/02 0527) SpO2:  [92 %-98 %] 94 % (06/02 0527)  Intake/Output from previous day: 06/01 0701 - 06/02 0700 In: 1400 [P.O.:600; I.V.:800] Out: -  Intake/Output this shift:     Recent Labs  12/24/14 0410 12/25/14 0425  HGB 10.9* 10.2*    Recent Labs  12/24/14 0410 12/25/14 0425  WBC 12.7* 11.1*  RBC 3.86* 3.63*  HCT 34.2* 31.8*  PLT 243 230    Recent Labs  12/24/14 0410 12/25/14 0425  NA 135 133*  K 4.7 4.0  CL 99* 103  CO2 25 23  BUN 17 11  CREATININE 0.92 0.87  GLUCOSE 135* 120*  CALCIUM 9.1 8.6*   No results for input(s): LABPT, INR in the last 72 hours.  Dorsiflexion/Plantar flexion intact Compartment soft  Assessment/Plan: 2 Days Post-Op Procedure(s) (LRB): TOTAL KNEE ARTHROPLASTY (Right) Discharge home with home health  [name] A 12/25/2014, 7:13 AM

## 2014-12-29 NOTE — Discharge Summary (Signed)
Physician Discharge Summary   Patient ID: [name] MRN: [id number] DOB/AGE: 12-22-1960 54 y.o.  Admit date: 12/23/2014 Discharge date: 12/25/2014  Primary Diagnosis: Primary osteoarthritis, right knee   Admission Diagnoses:  Past Medical History  Diagnosis Date  . Anxiety   . Depression   . Hypertension   . High cholesterol   . GERD (gastroesophageal reflux disease)   . Colon polyps   . Asthma   . PONV (postoperative nausea and vomiting)   . Arthritis   . Anemia     hx of with pregnancy   . Sleep apnea     has not use CPAP machine in 2 yrs/ DOES NOT KNOW IF SHE NEEDS TO USE C - PAP  . Headache     occasional headaches   . Shortness of breath dyspnea     due to weight gain    Discharge Diagnoses:   Active Problems:   History of total knee arthroplasty  Estimated body mass index is 52.84 kg/(m^2) as calculated from the following:   Height as of this encounter: _0  (1.626 m).   Weight as of this encounter: 139.708 kg (308 lb).  Procedure:  Procedure(s) (LRB): TOTAL KNEE ARTHROPLASTY (Right)   Consults: None  HPI: [name], 54 y.o. female, has a history of pain and functional disability in the right knee due to arthritis and has failed non-surgical conservative treatments for greater than 12 weeks to includeNSAID's and/or analgesics, corticosteriod injections, viscosupplementation injections and activity modification. Onset of symptoms was gradual, starting 3 years ago with gradually worsening course since that time. The patient noted prior procedures on the knee to include arthroscopy, menisectomy and bone grafting of necrotic tibia on the right knee(s). Patient currently rates pain in the right knee(s) at 8 out of 10 with activity. Patient has night pain, worsening of pain with activity and weight bearing, pain that interferes with activities of daily living, pain with passive range of motion, crepitus and joint swelling. Patient has evidence of  periarticular osteophytes and joint space narrowing by imaging studies. There is no active infection.  Laboratory Data: Admission on 12/23/2014, Discharged on 12/25/2014  Component Date Value Ref Range Status  . WBC 12/24/2014 12.7* 4.0 - 10.5 K/uL Final  . RBC 12/24/2014 3.86* 3.87 - 5.11 MIL/uL Final  . Hemoglobin 12/24/2014 10.9* 12.0 - 15.0 g/dL Final  . HCT 12/24/2014 34.2* 36.0 - 46.0 % Final  . MCV 12/24/2014 88.6  78.0 - 100.0 fL Final  . MCH 12/24/2014 28.2  26.0 - 34.0 pg Final  . MCHC 12/24/2014 31.9  30.0 - 36.0 g/dL Final  . RDW 12/24/2014 15.1  11.5 - 15.5 % Final  . Platelets 12/24/2014 243  150 - 400 K/uL Final  . Sodium 12/24/2014 135  135 - 145 mmol/L Final  . Potassium 12/24/2014 4.7  3.5 - 5.1 mmol/L Final  . Chloride 12/24/2014 99* 101 - 111 mmol/L Final  . CO2 12/24/2014 25  22 - 32 mmol/L Final  . Glucose, Bld 12/24/2014 135* 65 - 99 mg/dL Final  . BUN 12/24/2014 17  6 - 20 mg/dL Final  . Creatinine, Ser 12/24/2014 0.92  0.44 - 1.00 mg/dL Final  . Calcium 12/24/2014 9.1  8.9 - 10.3 mg/dL Final  . GFR calc non Af Amer 12/24/2014 >60  >60 mL/min Final  . GFR calc Af Amer 12/24/2014 >60  >60 mL/min Final   Comment: (NOTE) The eGFR has been calculated using the CKD EPI equation.  This calculation has not been validated in all clinical situations. eGFR's persistently <60 mL/min signify possible Chronic Kidney Disease.   . Anion gap 12/24/2014 11  5 - 15 Final  . WBC 12/25/2014 11.1* 4.0 - 10.5 K/uL Final  . RBC 12/25/2014 3.63* 3.87 - 5.11 MIL/uL Final  . Hemoglobin 12/25/2014 10.2* 12.0 - 15.0 g/dL Final  . HCT 12/25/2014 31.8* 36.0 - 46.0 % Final  . MCV 12/25/2014 87.6  78.0 - 100.0 fL Final  . MCH 12/25/2014 28.1  26.0 - 34.0 pg Final  . MCHC 12/25/2014 32.1  30.0 - 36.0 g/dL Final  . RDW 12/25/2014 14.9  11.5 - 15.5 % Final  . Platelets 12/25/2014 230  150 - 400 K/uL Final  . Sodium 12/25/2014 133* 135 - 145 mmol/L Final  . Potassium 12/25/2014 4.0  3.5  - 5.1 mmol/L Final  . Chloride 12/25/2014 103  101 - 111 mmol/L Final  . CO2 12/25/2014 23  22 - 32 mmol/L Final  . Glucose, Bld 12/25/2014 120* 65 - 99 mg/dL Final  . BUN 12/25/2014 11  6 - 20 mg/dL Final  . Creatinine, Ser 12/25/2014 0.87  0.44 - 1.00 mg/dL Final  . Calcium 12/25/2014 8.6* 8.9 - 10.3 mg/dL Final  . GFR calc non Af Amer 12/25/2014 >60  >60 mL/min Final  . GFR calc Af Amer 12/25/2014 >60  >60 mL/min Final   Comment: (NOTE) The eGFR has been calculated using the CKD EPI equation. This calculation has not been validated in all clinical situations. eGFR's persistently <60 mL/min signify possible Chronic Kidney Disease.   Georgiann Hahn gap 12/25/2014 7  5 - 15 Final  Hospital Outpatient Visit on 12/17/2014  Component Date Value Ref Range Status  . WBC 12/17/2014 7.1  4.0 - 10.5 K/uL Final  . RBC 12/17/2014 4.50  3.87 - 5.11 MIL/uL Final  . Hemoglobin 12/17/2014 12.2  12.0 - 15.0 g/dL Final  . HCT 12/17/2014 39.6  36.0 - 46.0 % Final  . MCV 12/17/2014 88.0  78.0 - 100.0 fL Final  . MCH 12/17/2014 27.1  26.0 - 34.0 pg Final  . MCHC 12/17/2014 30.8  30.0 - 36.0 g/dL Final  . RDW 12/17/2014 14.5  11.5 - 15.5 % Final  . Platelets 12/17/2014 260  150 - 400 K/uL Final  . Neutrophils Relative % 12/17/2014 66  43 - 77 % Final  . Neutro Abs 12/17/2014 4.7  1.7 - 7.7 K/uL Final  . Lymphocytes Relative 12/17/2014 22  12 - 46 % Final  . Lymphs Abs 12/17/2014 1.6  0.7 - 4.0 K/uL Final  . Monocytes Relative 12/17/2014 9  3 - 12 % Final  . Monocytes Absolute 12/17/2014 0.6  0.1 - 1.0 K/uL Final  . Eosinophils Relative 12/17/2014 3  0 - 5 % Final  . Eosinophils Absolute 12/17/2014 0.2  0.0 - 0.7 K/uL Final  . Basophils Relative 12/17/2014 0  0 - 1 % Final  . Basophils Absolute 12/17/2014 0.0  0.0 - 0.1 K/uL Final  . Sodium 12/17/2014 138  135 - 145 mmol/L Final  . Potassium 12/17/2014 4.3  3.5 - 5.1 mmol/L Final  . Chloride 12/17/2014 101  101 - 111 mmol/L Final  . CO2 12/17/2014 28  22  - 32 mmol/L Final  . Glucose, Bld 12/17/2014 79  65 - 99 mg/dL Final  . BUN 12/17/2014 16  6 - 20 mg/dL Final  . Creatinine, Ser 12/17/2014 0.93  0.44 - 1.00 mg/dL Final  . Calcium  12/17/2014 9.7  8.9 - 10.3 mg/dL Final  . Total Protein 12/17/2014 7.8  6.5 - 8.1 g/dL Final  . Albumin 12/17/2014 4.1  3.5 - 5.0 g/dL Final  . AST 12/17/2014 33  15 - 41 U/L Final  . ALT 12/17/2014 30  14 - 54 U/L Final  . Alkaline Phosphatase 12/17/2014 108  38 - 126 U/L Final  . Total Bilirubin 12/17/2014 0.4  0.3 - 1.2 mg/dL Final  . GFR calc non Af Amer 12/17/2014 >60  >60 mL/min Final  . GFR calc Af Amer 12/17/2014 >60  >60 mL/min Final   Comment: (NOTE) The eGFR has been calculated using the CKD EPI equation. This calculation has not been validated in all clinical situations. eGFR's persistently <60 mL/min signify possible Chronic Kidney Disease.   . Anion gap 12/17/2014 9  5 - 15 Final  . Prothrombin Time 12/17/2014 14.0  11.6 - 15.2 seconds Final  . INR 12/17/2014 1.06  0.00 - 1.49 Final  . ABO/RH(D) 12/17/2014 A POS   Final  . Antibody Screen 12/17/2014 NEG   Final  . Sample Expiration 12/17/2014 12/26/2014   Final  . Color, Urine 12/17/2014 YELLOW  YELLOW Final  . APPearance 12/17/2014 CLOUDY* CLEAR Final  . Specific Gravity, Urine 12/17/2014 1.022  1.005 - 1.030 Final  . pH 12/17/2014 5.0  5.0 - 8.0 Final  . Glucose, UA 12/17/2014 NEGATIVE  NEGATIVE mg/dL Final  . Hgb urine dipstick 12/17/2014 NEGATIVE  NEGATIVE Final  . Bilirubin Urine 12/17/2014 NEGATIVE  NEGATIVE Final  . Ketones, ur 12/17/2014 NEGATIVE  NEGATIVE mg/dL Final  . Protein, ur 12/17/2014 NEGATIVE  NEGATIVE mg/dL Final  . Urobilinogen, UA 12/17/2014 0.2  0.0 - 1.0 mg/dL Final  . Nitrite 12/17/2014 NEGATIVE  NEGATIVE Final  . Leukocytes, UA 12/17/2014 NEGATIVE  NEGATIVE Final   MICROSCOPIC NOT DONE ON URINES WITH NEGATIVE PROTEIN, BLOOD, LEUKOCYTES, NITRITE, OR GLUCOSE <1000 mg/dL.  Marland Kitchen MRSA, PCR 12/17/2014 NEGATIVE  NEGATIVE  Final  . Staphylococcus aureus 12/17/2014 NEGATIVE  NEGATIVE Final   Comment:        The Xpert SA Assay (FDA approved for NASAL specimens in patients over 90 years of age), is one component of a comprehensive surveillance program.  Test performance has been validated by [facility] for patients greater than or equal to 39 year old. It is not intended to diagnose infection nor to guide or monitor treatment.   Marland Kitchen aPTT 12/17/2014 30  24 - 37 seconds Final     X-Rays:Dg Chest 2 View  12/17/2014   CLINICAL DATA:  Total knee replacement.  Asthma .  EXAM: CHEST  2 VIEW  COMPARISON:  None.  FINDINGS: Interim removal of right PICC line. Mediastinum hilar structures normal. Lungs are clear. Cardiomegaly with normal pulmonary vascularity. Degenerative changes thoracic spine.  IMPRESSION: Cardiomegaly.  Otherwise negative exam.   Electronically Signed   By: Marcello Moores  Register   On: 12/17/2014 08:57     Hospital Course: Tonica Brasington is a 54 y.o. who was admitted to [facility]. They were brought to the operating room on 12/23/2014 and underwent Procedure(s): TOTAL KNEE ARTHROPLASTY.  Patient tolerated the procedure well and was later transferred to the recovery room and then to the orthopaedic floor for postoperative care.  They were given PO and IV analgesics for pain control following their surgery.  They were given 24 hours of postoperative antibiotics of  Anti-infectives    Start     Dose/Rate Route Frequency Ordered  Stop   12/23/14 2200  vancomycin (VANCOCIN) IVPB 1000 mg/200 mL premix     1,000 mg 200 mL/hr over 60 Minutes Intravenous Every 12 hours 12/23/14 1609 12/23/14 2228   12/23/14 1257  polymyxin B 500,000 Units, bacitracin 50,000 Units in sodium chloride irrigation 0.9 % 500 mL irrigation  Status:  Discontinued       As needed 12/23/14 1258 12/23/14 1454   12/23/14 0600  vancomycin (VANCOCIN) 1,500 mg in sodium chloride 0.9 % 500 mL IVPB     1,500 mg 250 mL/hr over 120  Minutes Intravenous On call to O.R. 12/22/14 1305 12/23/14 1225     and started on DVT prophylaxis in the form of Xarelto.   PT and OT were ordered for total joint protocol.  Discharge planning consulted to help with postop disposition and equipment needs.  Patient had a fair night on the evening of surgery.  They started to get up OOB with therapy on day one. Hemovac drain was pulled without difficulty.  Continued to work with therapy into day two.  Dressing was changed on day two and the incision was clean and dry.  The patient had progressed with therapy and meeting their goals.  Incision was healing well.  Patient was seen in rounds and was ready to go home.   Diet: Cardiac diet Activity:WBAT Follow-up:in 2 weeks Disposition - Home Discharged Condition: stable   Discharge Instructions    Call MD / Call 911    Complete by:  As directed   If you experience chest pain or shortness of breath, CALL 911 and be transported to the hospital emergency room.  If you develope a fever above 101 F, pus (white drainage) or increased drainage or redness at the wound, or calf pain, call your surgeon's office.     Constipation Prevention    Complete by:  As directed   Drink plenty of fluids.  Prune juice may be helpful.  You may use a stool softener, such as Colace (over the counter) 100 mg twice a day.  Use MiraLax (over the counter) for constipation as needed.     Diet - low sodium heart healthy    Complete by:  As directed      Discharge instructions    Complete by:  As directed   INSTRUCTIONS AFTER JOINT REPLACEMENT   Remove items at home which could result in a fall. This includes throw rugs or furniture in walking pathways ICE to the affected joint every three hours while awake for 30 minutes at a time, for at least the first 3-5 days, and then as needed for pain and swelling.  Continue to use ice for pain and swelling. You may notice swelling that will progress down to the foot and ankle.  This is  normal after surgery.  Elevate your leg when you are not up walking on it.   Continue to use the breathing machine you got in the hospital (incentive spirometer) which will help keep your temperature down.  It is common for your temperature to cycle up and down following surgery, especially at night when you are not up moving around and exerting yourself.  The breathing machine keeps your lungs expanded and your temperature down.   DIET:  As you were doing prior to hospitalization, we recommend a well-balanced diet.  DRESSING / WOUND CARE / SHOWERING  You may change your dressing daily after surgery.  Then change the dressing every day with sterile gauze.  Please use good  hand washing techniques before changing the dressing.  Do not use any lotions or creams on the incision until instructed by your surgeon.  ACTIVITY  Increase activity slowly as tolerated, but follow the weight bearing instructions below.   No driving for 6 weeks or until further direction given by your physician.  You cannot drive while taking narcotics.  No lifting or carrying greater than 10 lbs. until further directed by your surgeon. Avoid periods of inactivity such as sitting longer than an hour when not asleep. This helps prevent blood clots.  You may return to work once you are authorized by your doctor.     WEIGHT BEARING   Weight bearing as tolerated with assist device (walker, cane, etc) as directed, use it as long as suggested by your surgeon or therapist, typically at least 4-6 weeks.   EXERCISES  Results after joint replacement surgery are often greatly improved when you follow the exercise, range of motion and muscle strengthening exercises prescribed by your doctor. Safety measures are also important to protect the joint from further injury. Any time any of these exercises cause you to have increased pain or swelling, decrease what you are doing until you are comfortable again and then slowly increase them.  If you have problems or questions, call your caregiver or physical therapist for advice.   Rehabilitation is important following a joint replacement. After just a few days of immobilization, the muscles of the leg can become weakened and shrink (atrophy).  These exercises are designed to build up the tone and strength of the thigh and leg muscles and to improve motion. Often times heat used for twenty to thirty minutes before working out will loosen up your tissues and help with improving the range of motion but do not use heat for the first two weeks following surgery (sometimes heat can increase post-operative swelling).   These exercises can be done on a training (exercise) mat, on the floor, on a table or on a bed. Use whatever works the best and is most comfortable for you.    Use music or television while you are exercising so that the exercises are a pleasant break in your day. This will make your life better with the exercises acting as a break in your routine that you can look forward to.   Perform all exercises about fifteen times, three times per day or as directed.  You should exercise both the operative leg and the other leg as well.   Exercises include:   Quad Sets - Tighten up the muscle on the front of the thigh (Quad) and hold for 5-10 seconds.   Straight Leg Raises - With your knee straight (if you were given a brace, keep it on), lift the leg to 60 degrees, hold for 3 seconds, and slowly lower the leg.  Perform this exercise against resistance later as your leg gets stronger.  Leg Slides: Lying on your back, slowly slide your foot toward your buttocks, bending your knee up off the floor (only go as far as is comfortable). Then slowly slide your foot back down until your leg is flat on the floor again.  Angel Wings: Lying on your back spread your legs to the side as far apart as you can without causing discomfort.  Hamstring Strength:  Lying on your back, push your heel against the  floor with your leg straight by tightening up the muscles of your buttocks.  Repeat, but this time bend your knee to a  comfortable angle, and push your heel against the floor.  You may put a pillow under the heel to make it more comfortable if necessary.   A rehabilitation program following joint replacement surgery can speed recovery and prevent re-injury in the future due to weakened muscles. Contact your doctor or a physical therapist for more information on knee rehabilitation.    CONSTIPATION  Constipation is defined medically as fewer than three stools per week and severe constipation as less than one stool per week.  Even if you have a regular bowel pattern at home, your normal regimen is likely to be disrupted due to multiple reasons following surgery.  Combination of anesthesia, postoperative narcotics, change in appetite and fluid intake all can affect your bowels.   YOU MUST use at least one of the following options; they are listed in order of increasing strength to get the job done.  They are all available over the counter, and you may need to use some, POSSIBLY even all of these options:    Drink plenty of fluids (prune juice may be helpful) and high fiber foods Colace 100 mg by mouth twice a day  Senokot for constipation as directed and as needed Dulcolax (bisacodyl), take with full glass of water  Miralax (polyethylene glycol) once or twice a day as needed.  If you have tried all these things and are unable to have a bowel movement in the first 3-4 days after surgery call either your surgeon or your primary doctor.    If you experience loose stools or diarrhea, hold the medications until you stool forms back up.  If your symptoms do not get better within 1 week or if they get worse, check with your doctor.  If you experience "the worst abdominal pain ever" or develop nausea or vomiting, please contact the office immediately for further recommendations for treatment.   ITCHING:   If you experience itching with your medications, try taking only a single pain pill, or even half a pain pill at a time.  You can also use Benadryl over the counter for itching or also to help with sleep.   TED HOSE STOCKINGS:  Use stockings on both legs until for at least 2 weeks or as directed by physician office. They may be removed at night for sleeping.  MEDICATIONS:  See your medication summary on the "After Visit Summary" that nursing will review with you.  You may have some home medications which will be placed on hold until you complete the course of blood thinner medication.  It is important for you to complete the blood thinner medication as prescribed.  PRECAUTIONS:  If you experience chest pain or shortness of breath - call 911 immediately for transfer to the hospital emergency department.   If you develop a fever greater that 101 F, purulent drainage from wound, increased redness or drainage from wound, foul odor from the wound/dressing, or calf pain - CONTACT YOUR SURGEON.                                                   FOLLOW-UP APPOINTMENTS:  If you do not already have a post-op appointment, please call the office for an appointment to be seen by your surgeon.  Guidelines for how soon to be seen are listed in your "After Visit Summary", but are typically between  1-4 weeks after surgery.  OTHER INSTRUCTIONS:  Do not take vitamins or supplements while taking the blood thinner (Xarelto) for the next three weeks  MAKE SURE YOU:  Understand these instructions.  Get help right away if you are not doing well or get worse.    Thank you for letting us be a part of your medical care team.  It is a privilege we respect greatly.  We hope these instructions will help you stay on track for a fast and full recovery!     Increase activity slowly as tolerated    Complete by:  As directed             Medication List    STOP taking these medications        acetaminophen 500 MG tablet    Commonly known as:  TYLENOL     aspirin 325 MG EC tablet     HYDROcodone-acetaminophen 5-325 MG per tablet  Commonly known as:  NORCO/VICODIN      TAKE these medications        albuterol 108 (90 BASE) MCG/ACT inhaler  Commonly known as:  PROVENTIL HFA;VENTOLIN HFA  Inhale 2 puffs into the lungs every 6 (six) hours as needed for wheezing.     amLODipine 10 MG tablet  Commonly known as:  NORVASC  Take 10 mg by mouth every morning.     budesonide-formoterol 160-4.5 MCG/ACT inhaler  Commonly known as:  SYMBICORT  Inhale 1 puff into the lungs 2 (two) times daily.     lisinopril 20 MG tablet  Commonly known as:  PRINIVIL,ZESTRIL  TAKE 1 TABLET BY MOUTH EVERY DAY     lithium carbonate 300 MG capsule  Take 300 mg by mouth at bedtime.     methocarbamol 500 MG tablet  Commonly known as:  ROBAXIN  Take 1 tablet (500 mg total) by mouth every 6 (six) hours as needed for muscle spasms.     montelukast 10 MG tablet  Commonly known as:  SINGULAIR  TAKE 1 TABLET (10 MG TOTAL) BY MOUTH AT BEDTIME.     oxyCODONE-acetaminophen 5-325 MG per tablet  Commonly known as:  PERCOCET/ROXICET  Take 1-2 tablets by mouth every 4 (four) hours as needed for severe pain (Q4-6 hours PRN).     pravastatin 40 MG tablet  Commonly known as:  PRAVACHOL  Take 40 mg by mouth at bedtime.     pravastatin 40 MG tablet  Commonly known as:  PRAVACHOL  TAKE 1 TABLET BY MOUTH AT BEDTIME     promethazine 25 MG tablet  Commonly known as:  PHENERGAN  TAKE 1 TABLET (25 MG TOTAL) BY MOUTH EVERY 6 (SIX) HOURS AS NEEDED. FOR NAUSEA     REFRESH OP  Place 2 drops into both eyes daily as needed (For dry eyes.).     rivaroxaban 10 MG Tabs tablet  Commonly known as:  XARELTO  Take 1 tablet (10 mg total) by mouth daily with breakfast.     sertraline 100 MG tablet  Commonly known as:  ZOLOFT  Take 100 mg by mouth 2 (two) times daily.     zolpidem 12.5 MG CR tablet  Commonly known as:  AMBIEN CR  Take 12.5 mg by  mouth at bedtime.           Follow-up Information    Follow up with [facility].   Why:  home health physical therapy   Contact information:   [place]  [place] [address] [phone]       Follow up with GIOFFRE,RONALD A, MD. Schedule an appointment as soon as possible for a visit in 2 weeks.   Specialty:  Orthopedic Surgery   Contact information:   [address] [place] [address] [phone]       Signed: Ardeen Jourdain, PA-C Orthopaedic Surgery 12/29/2014, 8:47 AM

## 2015-01-11 IMAGING — CR DG KNEE COMPLETE 4+V*L*
4 series · 4 of 4 positions shown · non-contrast
Comparison: 11/23/2012

CLINICAL DATA: BILATERAL knee pain post fall in a web bathtub
today, LEFT knee surgery 3 months ago with followup knee surgery 1
month later due to infection, initial encounter

EXAM:
LEFT KNEE - COMPLETE 4+ VIEW

[t knee ap left]
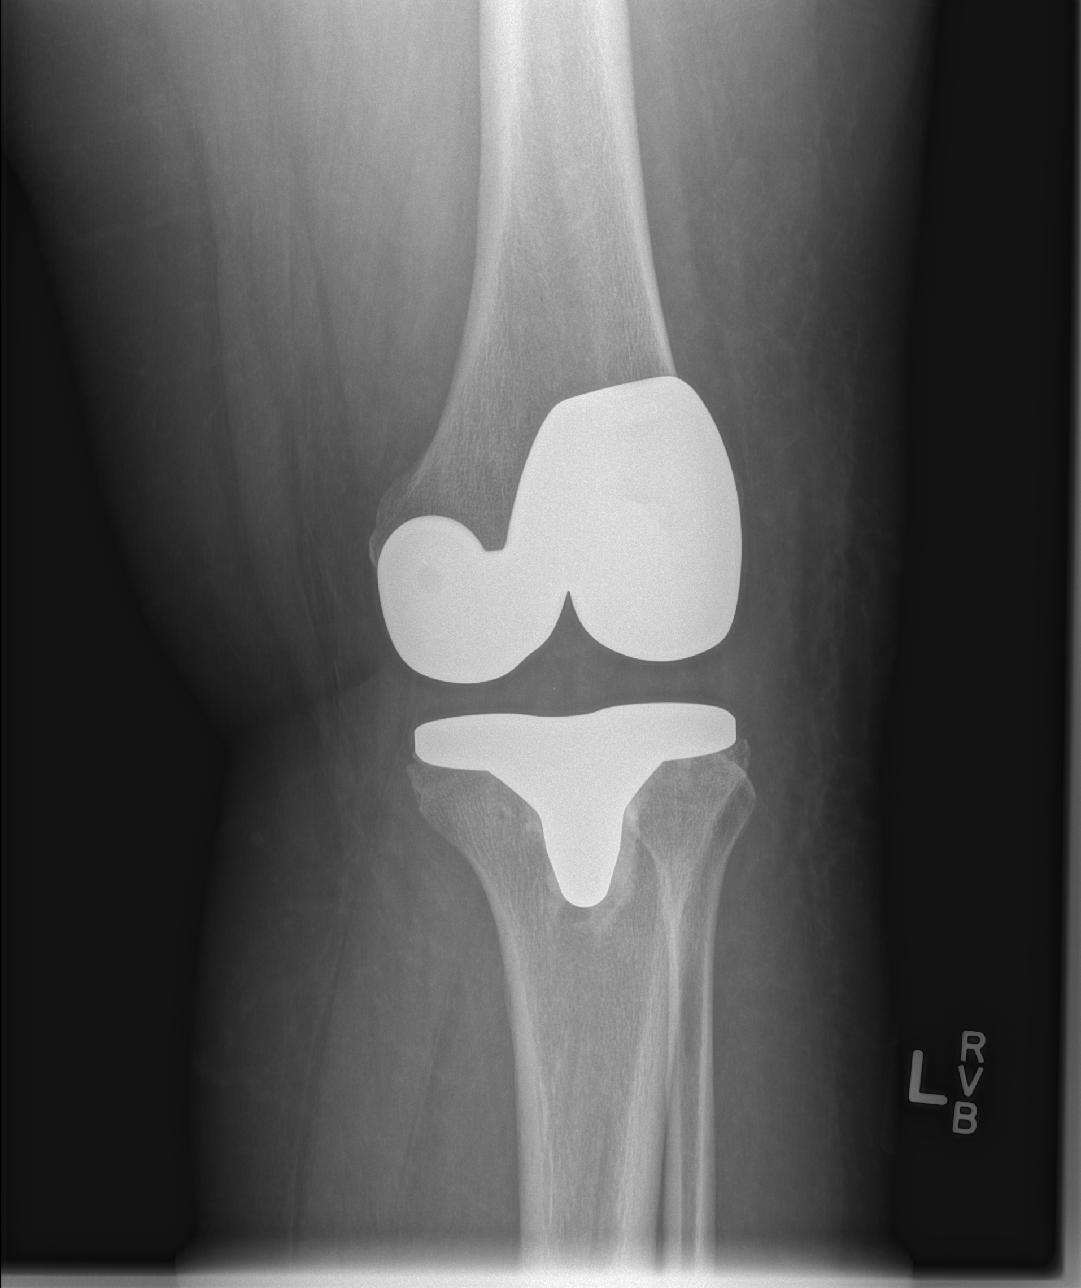

[t knee obl left (1 of 2)]
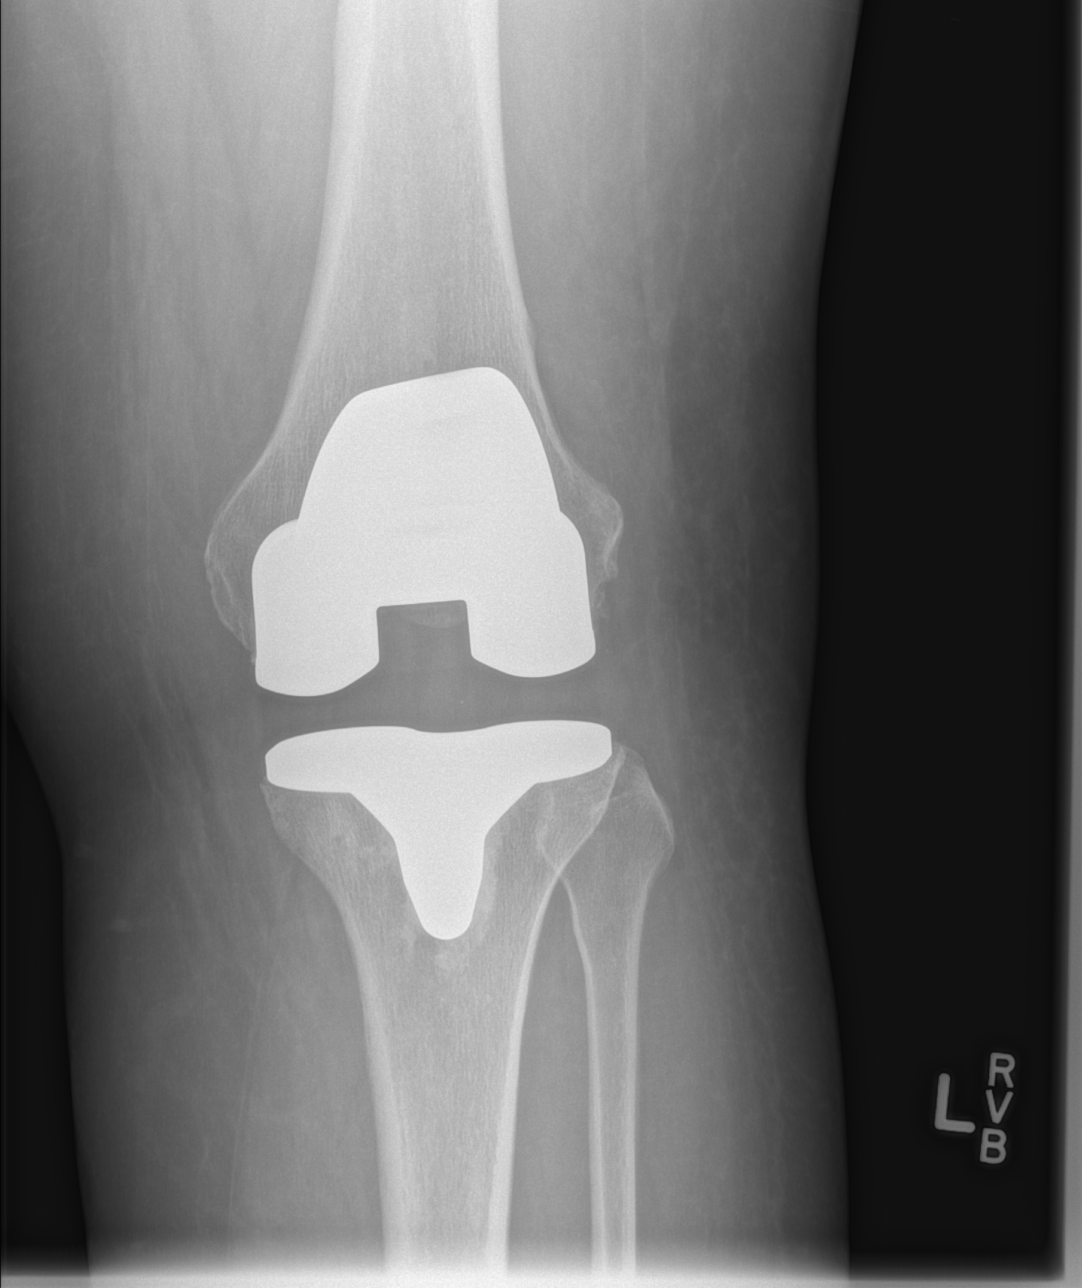

[t knee obl left (2 of 2)]
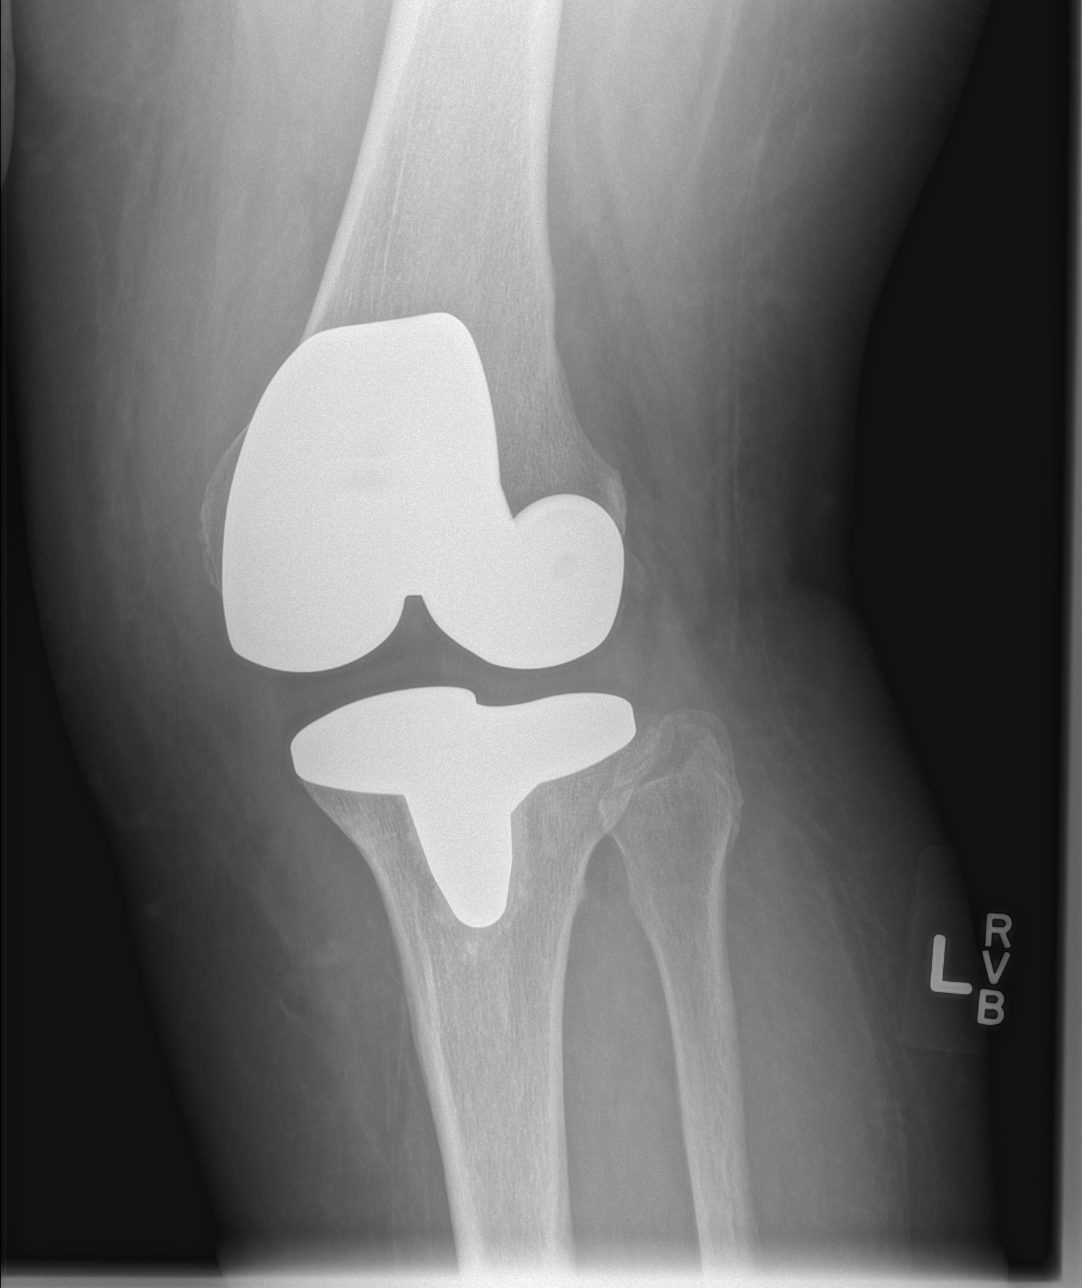

[t knee lat left]
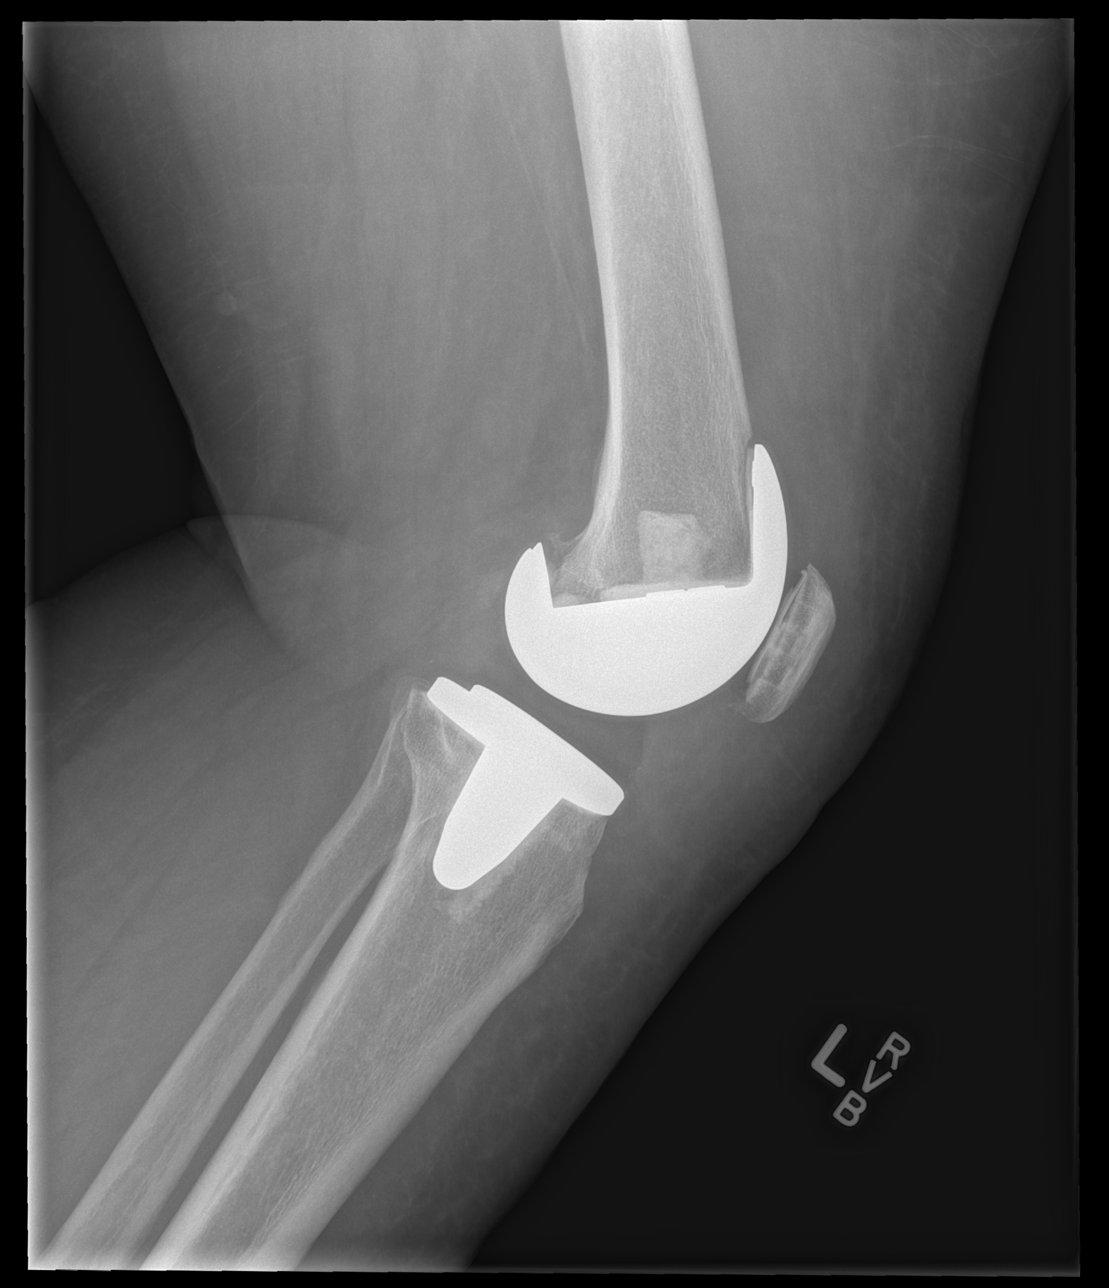

[4 of 4 positions shown; findings below may reference images not displayed]

FINDINGS: Bones demineralized.

Components of LEFT knee prosthesis in expected positions.

No acute fracture, dislocation, or bone destruction.

No knee joint effusion.
IMPRESSION: LEFT knee prosthesis and osseous demineralization without acute bony
abnormalities.

## 2015-01-11 IMAGING — CR DG LUMBAR SPINE COMPLETE 4+V
5 series · 5 of 5 positions shown · non-contrast
Comparison: 02/23/2012

CLINICAL DATA: Slipped and fall injury with diffuse low back pain.
Previous lumbar surgery.

EXAM:
LUMBAR SPINE - COMPLETE 4+ VIEW

[t lumbar spine ap]
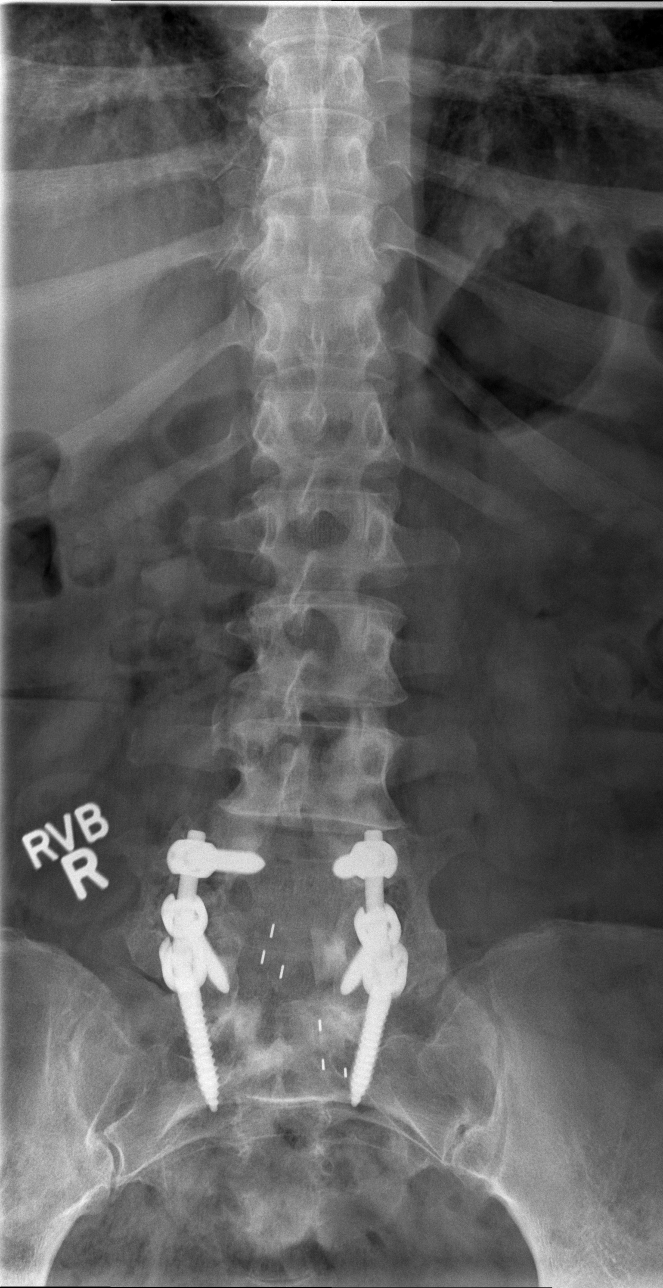

[t lumbar spine obl (1 of 2)]
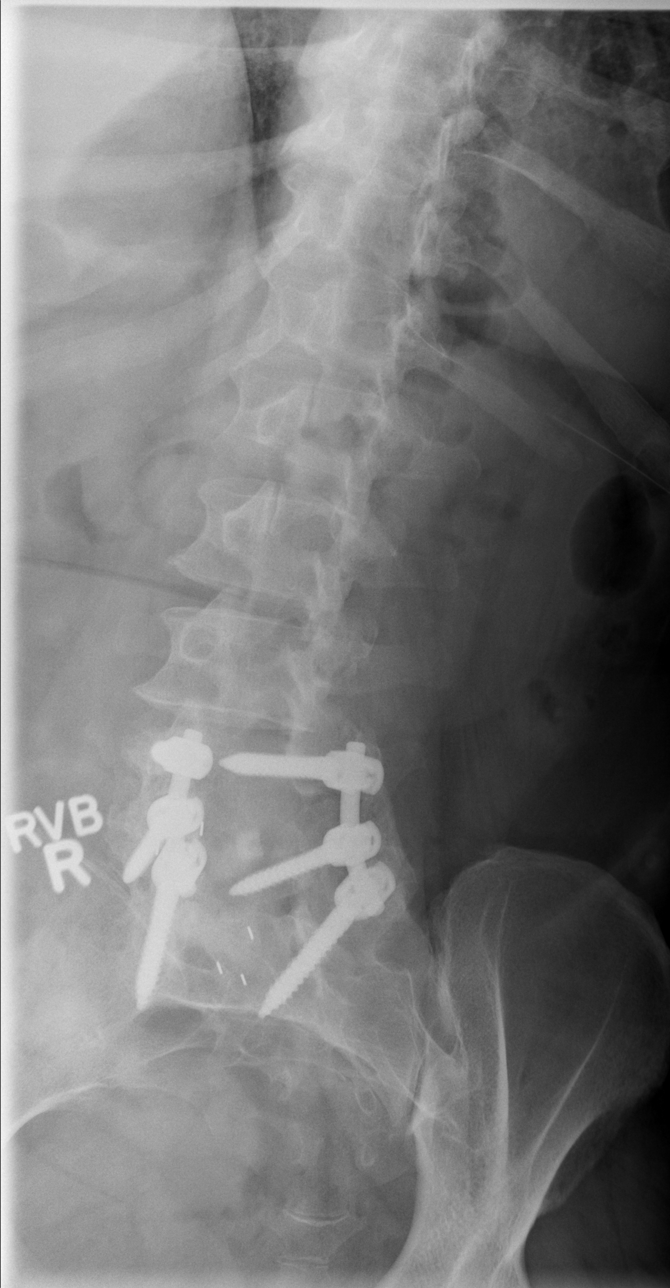

[t lumbar spine obl (2 of 2)]
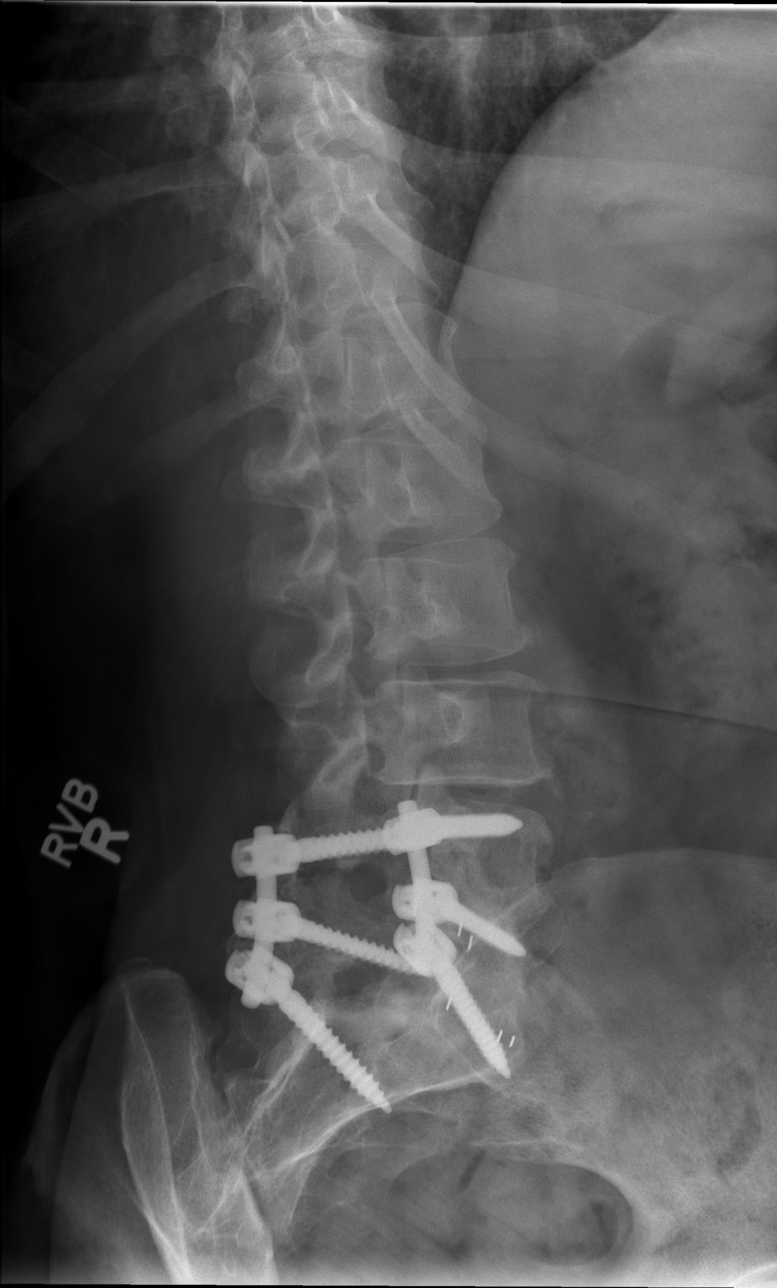

[t lumbar spine lat]
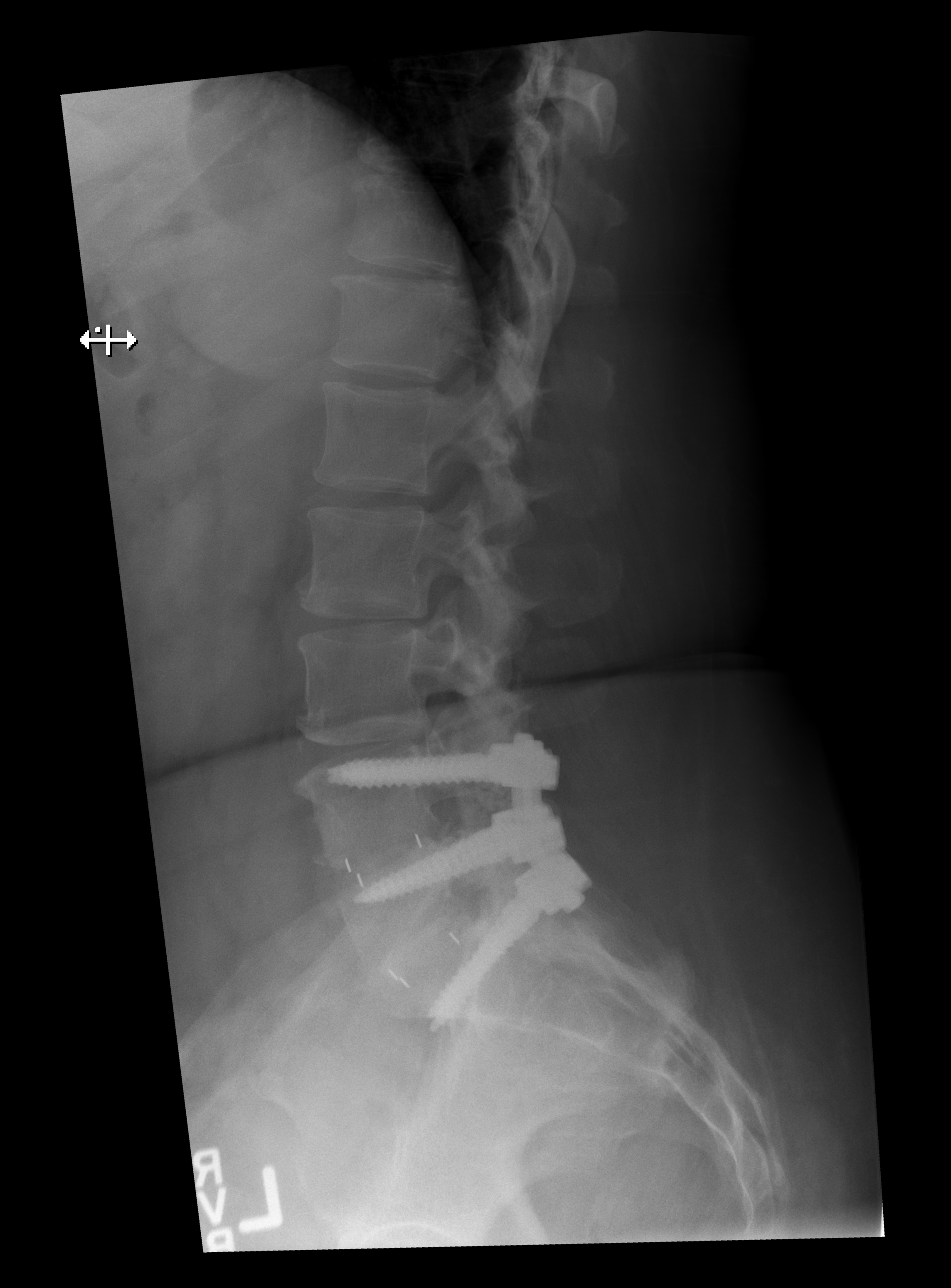

[t lumbar l-5 s-1 spot]
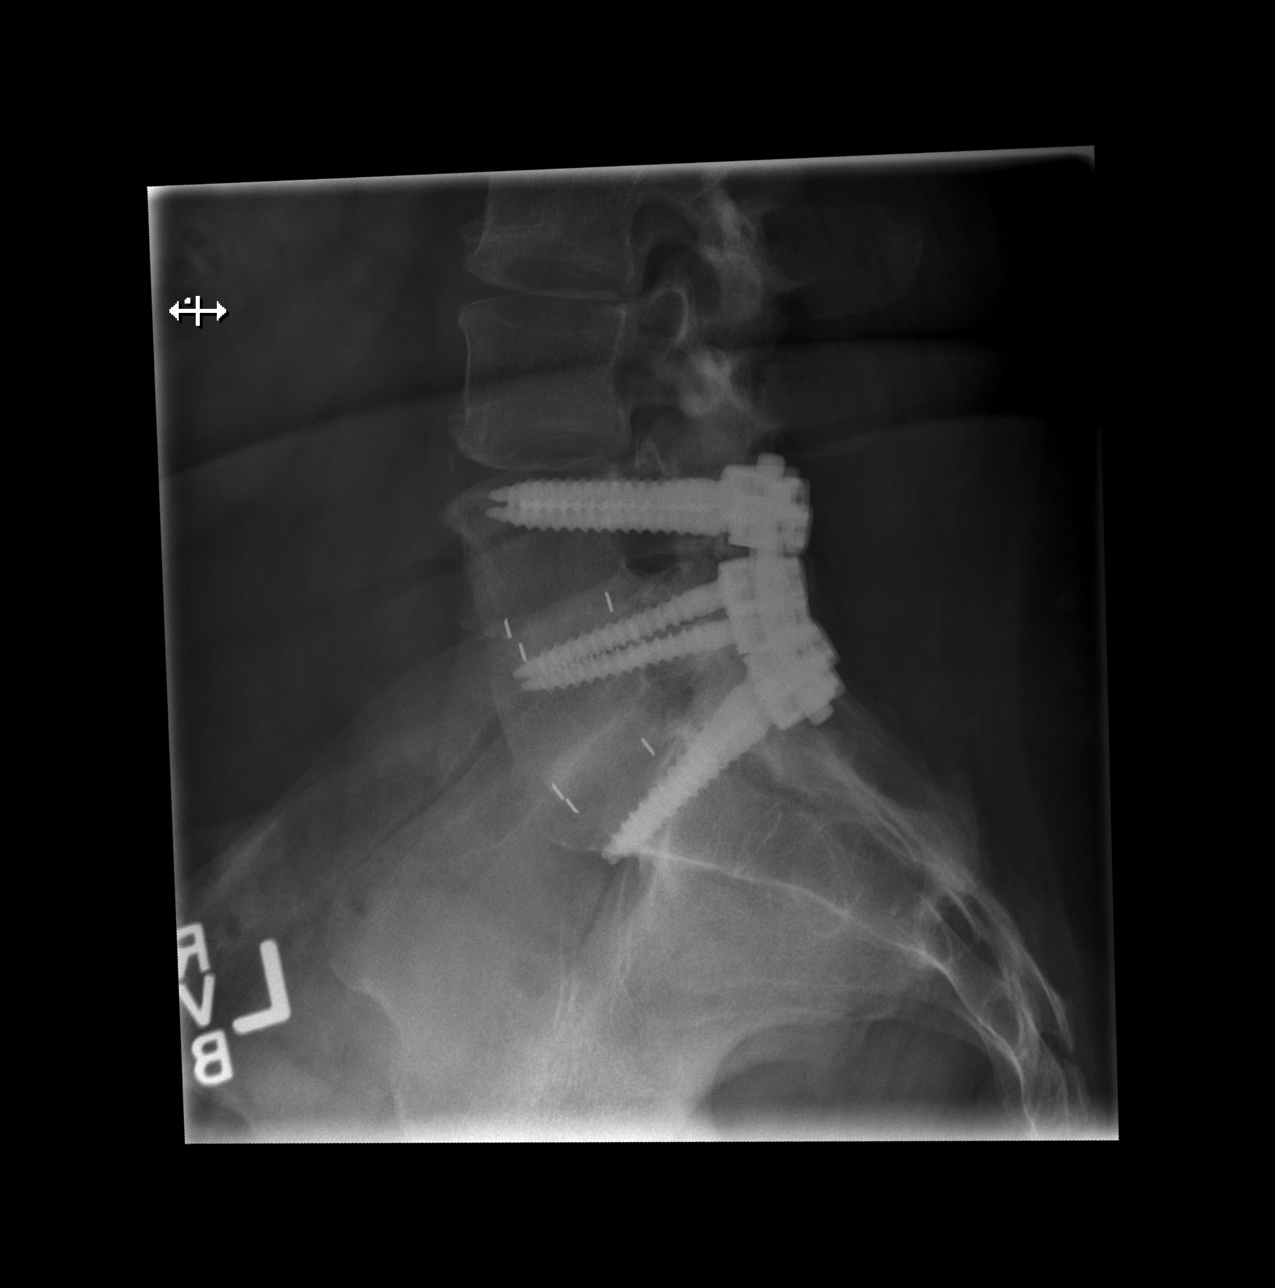

[5 of 5 positions shown; findings below may reference images not displayed]

FINDINGS: Postoperative changes with posterior laminectomies, posterior rod
and screw fixation, and intervertebral disc spacers at L4-5 and
L5-S1 levels. Surgical hardware appears unchanged in position since
previous study. Normal alignment of the lumbar spine. Degenerative
changes with narrowed lumbar interspaces and endplate hypertrophic
changes similar to previous study. No vertebral compression
deformities.
IMPRESSION: No change in appearance of postoperative changes from L4 to the
sacrum. Degenerative changes. No acute displaced fractures
identified.

## 2015-01-28 ENCOUNTER — Telehealth: Payer: Self-pay | Admitting: Family Medicine

## 2015-01-28 MED ORDER — BUDESONIDE-FORMOTEROL FUMARATE 160-4.5 MCG/ACT IN AERO: 1.0000 | INHALATION_SPRAY | Freq: Two times a day (BID) | RESPIRATORY_TRACT | Status: DC

## 2015-01-28 NOTE — Telephone Encounter (Signed)
Pt request refill of the following: budesonide-formoterol (SYMBICORT) 160-4.5 MCG/ACT inhaler    [facility]

## 2015-01-28 NOTE — Telephone Encounter (Signed)
RX sent to pharmacy  

## 2015-03-21 ENCOUNTER — Other Ambulatory Visit: Payer: Self-pay | Admitting: Family Medicine

## 2015-03-22 ENCOUNTER — Other Ambulatory Visit: Payer: Self-pay | Admitting: Family Medicine

## 2015-03-24 ENCOUNTER — Other Ambulatory Visit: Payer: Self-pay | Admitting: Family Medicine

## 2015-04-27 ENCOUNTER — Ambulatory Visit (INDEPENDENT_AMBULATORY_CARE_PROVIDER_SITE_OTHER): Payer: BC Managed Care – PPO | Admitting: Adult Health

## 2015-04-27 ENCOUNTER — Encounter: Payer: Self-pay | Admitting: Adult Health

## 2015-04-27 VITALS — BP 104/70 | HR 115 | Temp 98.7°F

## 2015-04-27 DIAGNOSIS — R05 Cough: Secondary | ICD-10-CM | POA: Diagnosis not present

## 2015-04-27 DIAGNOSIS — J0100 Acute maxillary sinusitis, unspecified: Secondary | ICD-10-CM | POA: Diagnosis not present

## 2015-04-27 DIAGNOSIS — R059 Cough, unspecified: Secondary | ICD-10-CM

## 2015-04-27 MED ORDER — MAGIC MOUTHWASH W/LIDOCAINE: 5.0000 mL | Freq: Three times a day (TID) | ORAL | Status: DC | PRN

## 2015-04-27 MED ORDER — HYDROCODONE-HOMATROPINE 5-1.5 MG/5ML PO SYRP: 5.0000 mL | ORAL_SOLUTION | Freq: Three times a day (TID) | ORAL | Status: DC | PRN

## 2015-04-27 MED ORDER — DOXYCYCLINE HYCLATE 100 MG PO CAPS: 100.0000 mg | ORAL_CAPSULE | Freq: Two times a day (BID) | ORAL | Status: DC

## 2015-04-27 MED ORDER — LIDOCAINE VISCOUS 2 % MT SOLN: 15.0000 mL | OROMUCOSAL | Status: DC | PRN

## 2015-04-27 NOTE — Addendum Note (Signed)
Addended by: Nancy Fetter on: 04/27/2015 12:42 PM   Modules accepted: Orders

## 2015-04-27 NOTE — Progress Notes (Signed)
Pre visit review using our clinic review tool, if applicable. No additional management support is needed unless otherwise documented below in the visit note.  Pt did not want to weight today//acm

## 2015-04-27 NOTE — Progress Notes (Signed)
Subjective:    Patient ID: [name], female    DOB: 04/05/1961, 54 y.o.   MRN: [id number]  HPI  54 year old female who presents to the office for sore throat and sinus pain and pressure since Friday with productive cough (barky cough) ( that is green and yellow) . Denies fevers, SOB, nausea, vomiting, diarrhea.    She has been using Nyquil, motrin, and tylenol without relief  Review of Systems  Constitutional: Positive for fatigue. Negative for fever, chills, activity change and appetite change.  HENT: Positive for congestion, ear pain, postnasal drip, sinus pressure, sneezing and sore throat. Negative for trouble swallowing.   Respiratory: Positive for cough. Negative for apnea, choking, chest tightness and shortness of breath.   Cardiovascular: Negative for chest pain, palpitations and leg swelling.  Musculoskeletal: Negative.   Skin: Negative.   Neurological: Negative.   Hematological: Positive for adenopathy.   Past Medical History  Diagnosis Date  . Anxiety   . Depression   . Hypertension   . High cholesterol   . GERD (gastroesophageal reflux disease)   . Colon polyps   . Asthma   . PONV (postoperative nausea and vomiting)   . Arthritis   . Anemia     hx of with pregnancy   . Sleep apnea     has not use CPAP machine in 2 yrs/ DOES NOT KNOW IF SHE NEEDS TO USE C - PAP  . Headache     occasional headaches   . Shortness of breath dyspnea     due to weight gain     Social History   Social History  . Marital Status: Married    Spouse Name: N/A  . Number of Children: N/A  . Years of Education: N/A   Occupational History  . Not on file.   Social History Main Topics  . Smoking status: Never Smoker   . Smokeless tobacco: Never Used  . Alcohol Use: No  . Drug Use: No  . Sexual Activity: Not on file   Other Topics Concern  . Not on file   Social History Narrative    Past Surgical History  Procedure Laterality Date  . Endometrial ablation w/  novasure    . Carpal tunnel release  2007    both hands  . Trigger finger release surgeyr       bilateral   . Tubal ligation    . Knee arthroscopy      let knee 03/2013   . Knee arthroscopy with medial menisectomy Right 10/22/2013    Procedure: RIGHT KNEE ARTHROSCOPY WITH MEDIAL MENISECTOMY, abrasion chrondrplasty Brigid Re OUT/CORRECT CURETTEMENT/BONE GRAFT/PROXIMAL TIBIA ;  Surgeon: Jacki Cones, MD;  Location: [facility];  Service: Orthopedics;  Laterality: Right;  . Back surgery  2012    lumb fusion  . Colonoscopy    . Excision metacarpal mass Right 03/10/2014    Procedure: EXCISION MASS RIGHT HAND FIRST WEB SPACE DORSAL PALMAR INCISION ;  Surgeon: Cindee Salt, MD;  Location: [facility];  Service: Orthopedics;  Laterality: Right;  . Total knee arthroplasty Left 04/16/2014    Procedure: LEFT TOTAL KNEE ARTHROPLASTY;  Surgeon: Jacki Cones, MD;  Location: [facility];  Service: Orthopedics;  Laterality: Left;  . Irrigation and debridement knee Left 05/23/2014    Procedure: IRRIGATION AND DEBRIDEMENT KNEE;  Surgeon: [name], MD;  Location: [facility];  Service: Orthopedics;  Laterality: Left;  With POLYETHYLENE EXCHANGE  . Total knee arthroplasty  Right 12/23/2014    Procedure: TOTAL KNEE ARTHROPLASTY;  Surgeon: Ranee Gosselin, MD;  Location: [facility];  Service: Orthopedics;  Laterality: Right;    Family History  Problem Relation Age of Onset  . Alcohol abuse Mother   . Arthritis Mother   . Hyperlipidemia Father   . Hypertension Father   . Heart disease Father 33    CABG  . Hypertension Sister   . Cancer Brother     colon, lung, liver  . Cancer Maternal Grandmother     breast  . Cancer Daughter     NEUROBLASTOMA    Allergies  Allergen Reactions  . Adhesive [Tape] Other (See Comments)    Peeled off skin  . Ceclor [Cefaclor] Other (See Comments)    Blisters on hands  . Chocolate Other (See Comments)    migraines  . Venlafaxine Other (See Comments)    "refuses to  take - makes me feel like I am dying"    Current Outpatient Prescriptions on File Prior to Visit  Medication Sig Dispense Refill  . albuterol (PROVENTIL HFA;VENTOLIN HFA) 108 (90 BASE) MCG/ACT inhaler Inhale 2 puffs into the lungs every 6 (six) hours as needed for wheezing. 8.5 g 5  . amLODipine (NORVASC) 10 MG tablet TAKE 1 TABLET (10 MG TOTAL) BY MOUTH DAILY. 30 tablet 3  . budesonide-formoterol (SYMBICORT) 160-4.5 MCG/ACT inhaler Inhale 1 puff into the lungs 2 (two) times daily. 1 Inhaler 3  . furosemide (LASIX) 20 MG tablet TAKE 1 TABLET (20 MG TOTAL) BY MOUTH DAILY. 90 tablet 1  . lisinopril (PRINIVIL,ZESTRIL) 20 MG tablet TAKE 1 TABLET BY MOUTH EVERY DAY 30 tablet 5  . lithium carbonate 300 MG capsule Take 300 mg by mouth at bedtime.    . montelukast (SINGULAIR) 10 MG tablet TAKE 1 TABLET (10 MG TOTAL) BY MOUTH AT BEDTIME. 30 tablet 3  . Polyvinyl Alcohol-Povidone (REFRESH OP) Place 2 drops into both eyes daily as needed (For dry eyes.).     Marland Kitchen pravastatin (PRAVACHOL) 40 MG tablet TAKE 1 TABLET BY MOUTH AT BEDTIME 30 tablet 5  . promethazine (PHENERGAN) 25 MG tablet TAKE 1 TABLET EVERY 6 HOURS AS NEEDED FOR NAUSEA 30 tablet 3  . sertraline (ZOLOFT) 100 MG tablet Take 100 mg by mouth 2 (two) times daily.     Marland Kitchen zolpidem (AMBIEN CR) 12.5 MG CR tablet Take 12.5 mg by mouth at bedtime.     No current facility-administered medications on file prior to visit.    BP 104/70 mmHg  Pulse 138  Temp(Src) 98.7 F (37.1 C) (Oral)  SpO2 95%      Objective:   Physical Exam  Constitutional: She is oriented to person, place, and time. She appears well-developed and well-nourished.  obese  HENT:  Head: Normocephalic and atraumatic.  Right Ear: External ear normal.  Left Ear: External ear normal.  Nose: Nose normal.  Mouth/Throat: Oropharynx is clear and moist. No oropharyngeal exudate.  No redness and swelling to tonsils  Eyes: Conjunctivae and EOM are normal. Pupils are equal, round, and  reactive to light. Right eye exhibits no discharge. Left eye exhibits no discharge.  Neck: Normal range of motion. Neck supple.  Cardiovascular: Regular rhythm, normal heart sounds and intact distal pulses.  Exam reveals no gallop and no friction rub.   No murmur heard. tachycardic  Pulmonary/Chest: Effort normal and breath sounds normal. No respiratory distress. She has no wheezes. She has no rales. She exhibits no tenderness.  Abdominal: Soft. Bowel sounds  are normal. She exhibits no distension and no mass. There is no tenderness. There is no rebound and no guarding.  Lymphadenopathy:    She has cervical adenopathy.  Neurological: She is alert and oriented to person, place, and time.  Skin: Skin is warm and dry. No rash noted. No erythema. No pallor.  Psychiatric: She has a normal mood and affect. Her behavior is normal. Judgment and thought content normal.  Nursing note and vitals reviewed.     Assessment & Plan:   1. Acute maxillary sinusitis, recurrence not specified - magic mouthwash w/lidocaine SOLN; Take 5 mLs by mouth 3 (three) times daily as needed for mouth pain.  Dispense: 50 mL; Refill: 0 - doxycycline (VIBRAMYCIN) 100 MG capsule; Take 1 capsule (100 mg total) by mouth 2 (two) times daily.  Dispense: 14 capsule; Refill: 0 - Stay hydrated and rest  - Follow up if 2-3 days if no improvement or sooner if symptoms worsen.  2. Cough - HYDROcodone-homatropine (HYCODAN) 5-1.5 MG/5ML syrup; Take 5 mLs by mouth every 8 (eight) hours as needed for cough.  Dispense: 120 mL; Refill: 0 - Mucinex cough during the day  -

## 2015-04-27 NOTE — Patient Instructions (Addendum)
It was great meeting you today and I am sorry you are feeling so badly.   Please take the Doxycycline twice a day for 7 days.   Use he mouth wash and cough syrup as needed.   Follow up if no improvement in the next 2-3 days.                               Sinusitis Sinusitis is redness, soreness, and inflammation of the paranasal sinuses. Paranasal sinuses are air pockets within the bones of your face (beneath the eyes, the middle of the forehead, or above the eyes). In healthy paranasal sinuses, mucus is able to drain out, and air is able to circulate through them by way of your nose. However, when your paranasal sinuses are inflamed, mucus and air can become trapped. This can allow bacteria and other germs to grow and cause infection. Sinusitis can develop quickly and last only a short time (acute) or continue over a long period (chronic). Sinusitis that lasts for more than 12 weeks is considered chronic.  CAUSES  Causes of sinusitis include:  Allergies.  Structural abnormalities, such as displacement of the cartilage that separates your nostrils (deviated septum), which can decrease the air flow through your nose and sinuses and affect sinus drainage.  Functional abnormalities, such as when the small hairs (cilia) that line your sinuses and help remove mucus do not work properly or are not present. SIGNS AND SYMPTOMS  Symptoms of acute and chronic sinusitis are the same. The primary symptoms are pain and pressure around the affected sinuses. Other symptoms include:  Upper toothache.  Earache.  Headache.  Bad breath.  Decreased sense of smell and taste.  A cough, which worsens when you are lying flat.  Fatigue.  Fever.  Thick drainage from your nose, which often is green and may contain pus (purulent).  Swelling and warmth over the affected sinuses. DIAGNOSIS  Your health care provider will perform a physical exam. During the exam, your health care provider may:  Look in  your nose for signs of abnormal growths in your nostrils (nasal polyps).  Tap over the affected sinus to check for signs of infection.  View the inside of your sinuses (endoscopy) using an imaging device that has a light attached (endoscope). If your health care provider suspects that you have chronic sinusitis, one or more of the following tests may be recommended:  Allergy tests.  Nasal culture. A sample of mucus is taken from your nose, sent to a lab, and screened for bacteria.  Nasal cytology. A sample of mucus is taken from your nose and examined by your health care provider to determine if your sinusitis is related to an allergy. TREATMENT  Most cases of acute sinusitis are related to a viral infection and will resolve on their own within 10 days. Sometimes medicines are prescribed to help relieve symptoms (pain medicine, decongestants, nasal steroid sprays, or saline sprays).  However, for sinusitis related to a bacterial infection, your health care provider will prescribe antibiotic medicines. These are medicines that will help kill the bacteria causing the infection.  Rarely, sinusitis is caused by a fungal infection. In theses cases, your health care provider will prescribe antifungal medicine. For some cases of chronic sinusitis, surgery is needed. Generally, these are cases in which sinusitis recurs more than 3 times per year, despite other treatments. HOME CARE INSTRUCTIONS   Drink plenty of water. Water helps  thin the mucus so your sinuses can drain more easily.  Use a humidifier.  Inhale steam 3 to 4 times a day (for example, sit in the bathroom with the shower running).  Apply a warm, moist washcloth to your face 3 to 4 times a day, or as directed by your health care provider.  Use saline nasal sprays to help moisten and clean your sinuses.  Take medicines only as directed by your health care provider.  If you were prescribed either an antibiotic or antifungal medicine,  finish it all even if you start to feel better. SEEK IMMEDIATE MEDICAL CARE IF:  You have increasing pain or severe headaches.  You have nausea, vomiting, or drowsiness.  You have swelling around your face.  You have vision problems.  You have a stiff neck.  You have difficulty breathing. MAKE SURE YOU:   Understand these instructions.  Will watch your condition.  Will get help right away if you are not doing well or get worse. Document Released: 07/11/2005 Document Revised: 11/25/2013 Document Reviewed: 07/26/2011 [facility] Patient Information 2015 [place], [place]. This information is not intended to replace advice given to you by your health care provider. Make sure you discuss any questions you have with your health care provider.

## 2015-04-28 ENCOUNTER — Telehealth: Payer: Self-pay | Admitting: Family Medicine

## 2015-04-28 NOTE — Telephone Encounter (Signed)
Pt said she is having a bad headache and is asking if something can be called in for this headache     Pharmacy [facility]

## 2015-04-29 NOTE — Telephone Encounter (Signed)
Called and spoke with pt and pt is aware of Cory's recommendations.  Pt will follow up if her symptoms are not getting better.

## 2015-04-29 NOTE — Telephone Encounter (Signed)
I recommend either  Tylenol and have some caffeine or Excedrin Migraine. If her headache it not relieved with this then she needs to follow up with PCP for further evaluation.

## 2015-05-04 ENCOUNTER — Telehealth: Payer: Self-pay | Admitting: Family Medicine

## 2015-05-04 ENCOUNTER — Other Ambulatory Visit: Payer: Self-pay | Admitting: Adult Health

## 2015-05-04 DIAGNOSIS — R059 Cough, unspecified: Secondary | ICD-10-CM

## 2015-05-04 DIAGNOSIS — R05 Cough: Secondary | ICD-10-CM

## 2015-05-04 MED ORDER — HYDROCODONE-HOMATROPINE 5-1.5 MG/5ML PO SYRP: 5.0000 mL | ORAL_SOLUTION | Freq: Three times a day (TID) | ORAL | Status: DC | PRN

## 2015-05-04 NOTE — Telephone Encounter (Signed)
Called and spoke with pt and pt states she is coughing a lot, she gets hot when she moves and this starts acoughing episode. Pt states she sounds worse than a dog barking.  Pt sates she is better than she was but her mucus/drainage is yellow,green, bloody color, no fever, no body aches, chills, headache with coughing spelsl. Pt has not had a chest x-ray.  Pls advise.

## 2015-05-04 NOTE — Telephone Encounter (Signed)
Called and spoke with pt and pt is aware of going to [facility] for a chest xray on tomorrow. Ok per [employer] to refill Hycodan. Left a vm for pt that rx is ready for pick up.

## 2015-05-04 NOTE — Telephone Encounter (Signed)
Pt request refill of the following: HYDROcodone-homatropine (HYCODAN) 5-1.5 MG/5ML syrup  Pt said she think she also need a 2nd round of the following med  doxycycline (VIBRAMYCIN) 100 MG capsule  [phone]  Phamacy:

## 2015-05-04 NOTE — Telephone Encounter (Signed)
Left a message for return call.  

## 2015-05-04 NOTE — Telephone Encounter (Signed)
She should go get a chest x ray. Order placed for [facility] office

## 2015-05-13 ENCOUNTER — Ambulatory Visit (INDEPENDENT_AMBULATORY_CARE_PROVIDER_SITE_OTHER)
Admission: RE | Admit: 2015-05-13 | Discharge: 2015-05-13 | Disposition: A | Discharge status: Home / Self Care | Payer: BC Managed Care – PPO | Source: Ambulatory Visit | Attending: Adult Health | Admitting: Adult Health

## 2015-05-13 ENCOUNTER — Telehealth: Payer: Self-pay | Admitting: Adult Health

## 2015-05-13 ENCOUNTER — Telehealth: Payer: Self-pay | Admitting: Family Medicine

## 2015-05-13 ENCOUNTER — Other Ambulatory Visit: Payer: Self-pay | Admitting: Adult Health

## 2015-05-13 DIAGNOSIS — R05 Cough: Secondary | ICD-10-CM | POA: Diagnosis not present

## 2015-05-13 DIAGNOSIS — R059 Cough, unspecified: Secondary | ICD-10-CM

## 2015-05-13 MED ORDER — PREDNISONE 20 MG PO TABS: ORAL_TABLET | ORAL | Status: DC

## 2015-05-13 NOTE — Telephone Encounter (Signed)
Spoke to patient on the phone and advised her of her chest xray results. She does endorse that her cough is improving slowly. Sent in prescription for prednisone 40mg  x 3 days then 20 mg x 3 days. She is to follow up if no improvement.

## 2015-05-13 NOTE — Telephone Encounter (Signed)
Pt would like a call back once xray results are back because she would like to discuss if she need to see someone else about her asthma issues.

## 2015-05-13 NOTE — Telephone Encounter (Signed)
Left VM to call back regarding chest x ray results

## 2015-05-14 ENCOUNTER — Other Ambulatory Visit: Payer: Self-pay | Admitting: Adult Health

## 2015-05-14 DIAGNOSIS — R05 Cough: Secondary | ICD-10-CM

## 2015-05-14 DIAGNOSIS — R059 Cough, unspecified: Secondary | ICD-10-CM

## 2015-05-14 MED ORDER — HYDROCODONE-HOMATROPINE 5-1.5 MG/5ML PO SYRP: 5.0000 mL | ORAL_SOLUTION | Freq: Three times a day (TID) | ORAL | Status: DC | PRN

## 2015-05-14 NOTE — Addendum Note (Signed)
Addended by: Azucena FreedMILLNER, Kaleem Sartwell C on: 05/14/2015 04:44 PM   Modules accepted: Orders

## 2015-05-14 NOTE — Telephone Encounter (Signed)
Called and spoke with pt and pt is aware.  

## 2015-05-20 ENCOUNTER — Other Ambulatory Visit: Payer: Self-pay | Admitting: Family Medicine

## 2015-06-21 ENCOUNTER — Other Ambulatory Visit: Payer: Self-pay | Admitting: Family Medicine

## 2015-06-24 ENCOUNTER — Encounter: Payer: Self-pay | Admitting: Family Medicine

## 2015-06-24 ENCOUNTER — Other Ambulatory Visit: Payer: Self-pay | Admitting: Family Medicine

## 2015-06-24 ENCOUNTER — Ambulatory Visit (INDEPENDENT_AMBULATORY_CARE_PROVIDER_SITE_OTHER): Payer: BC Managed Care – PPO | Admitting: Family Medicine

## 2015-06-24 VITALS — BP 110/90 | HR 112 | Temp 98.2°F | Resp 16 | Ht 64.0 in | Wt 334.5 lb

## 2015-06-24 DIAGNOSIS — R739 Hyperglycemia, unspecified: Secondary | ICD-10-CM

## 2015-06-24 DIAGNOSIS — H66002 Acute suppurative otitis media without spontaneous rupture of ear drum, left ear: Secondary | ICD-10-CM | POA: Diagnosis not present

## 2015-06-24 DIAGNOSIS — R740 Nonspecific elevation of levels of transaminase and lactic acid dehydrogenase [LDH]: Secondary | ICD-10-CM | POA: Diagnosis not present

## 2015-06-24 DIAGNOSIS — R059 Cough, unspecified: Secondary | ICD-10-CM

## 2015-06-24 DIAGNOSIS — R7401 Elevation of levels of liver transaminase levels: Secondary | ICD-10-CM

## 2015-06-24 DIAGNOSIS — R05 Cough: Secondary | ICD-10-CM

## 2015-06-24 LAB — HEPATIC FUNCTION PANEL
ALT: 20 U/L (ref 0–35)
AST: 21 U/L (ref 0–37)
Albumin: 4 g/dL (ref 3.5–5.2)
Alkaline Phosphatase: 99 U/L (ref 39–117)
Bilirubin, Direct: 0 mg/dL (ref 0.0–0.3)
Total Bilirubin: 0.4 mg/dL (ref 0.2–1.2)
Total Protein: 7.6 g/dL (ref 6.0–8.3)

## 2015-06-24 LAB — HEMOGLOBIN A1C: Hgb A1c MFr Bld: 6 % (ref 4.6–6.5)

## 2015-06-24 MED ORDER — AMOXICILLIN 875 MG PO TABS: 875.0000 mg | ORAL_TABLET | Freq: Two times a day (BID) | ORAL | Status: DC

## 2015-06-24 MED ORDER — HYDROCODONE-HOMATROPINE 5-1.5 MG/5ML PO SYRP: 5.0000 mL | ORAL_SOLUTION | Freq: Three times a day (TID) | ORAL | Status: DC | PRN

## 2015-06-24 NOTE — Progress Notes (Signed)
Pre visit review using our clinic review tool, if applicable. No additional management support is needed unless otherwise documented below in the visit note. 

## 2015-06-24 NOTE — Progress Notes (Signed)
Subjective:    Patient ID: [name], female    DOB: 08-16-1960, 54 y.o.   MRN: [id number]  HPI Patient seen for following issues:  3-4 day history of bilateral earache. Left greater than right.  Has also some sore throat and cough which is occasionally productive. Yellowish nasal discharge. No definite fevers or chills. Pain is moderate. Reported allergy to Ceclor though patient states she has taken amoxicillin without difficulty in the past  History of mild transaminase elevations recently. She's had some weight gain recently which she attributed to poor compliance. Past history of hyperglycemia. Most recent A1c was at goal. Does not monitor blood sugars regularly. No regular physical activity. No recent chest pains. No dizziness. No polyuria or polyuria.  Past Medical History  Diagnosis Date  . Anxiety   . Depression   . Hypertension   . High cholesterol   . GERD (gastroesophageal reflux disease)   . Colon polyps   . Asthma   . PONV (postoperative nausea and vomiting)   . Arthritis   . Anemia     hx of with pregnancy   . Sleep apnea     has not use CPAP machine in 2 yrs/ DOES NOT KNOW IF SHE NEEDS TO USE C - PAP  . Headache     occasional headaches   . Shortness of breath dyspnea     due to weight gain    Past Surgical History  Procedure Laterality Date  . Endometrial ablation w/ novasure    . Carpal tunnel release  2007    both hands  . Trigger finger release surgeyr       bilateral   . Tubal ligation    . Knee arthroscopy      let knee 03/2013   . Knee arthroscopy with medial menisectomy Right 10/22/2013    Procedure: RIGHT KNEE ARTHROSCOPY WITH MEDIAL MENISECTOMY, abrasion chrondrplasty Brigid Re OUT/CORRECT CURETTEMENT/BONE GRAFT/PROXIMAL TIBIA ;  Surgeon: Jacki Cones, MD;  Location: [facility];  Service: Orthopedics;  Laterality: Right;  . Back surgery  2012    lumb fusion  . Colonoscopy    . Excision metacarpal mass Right 03/10/2014    Procedure:  EXCISION MASS RIGHT HAND FIRST WEB SPACE DORSAL PALMAR INCISION ;  Surgeon: Cindee Salt, MD;  Location: [facility];  Service: Orthopedics;  Laterality: Right;  . Total knee arthroplasty Left 04/16/2014    Procedure: LEFT TOTAL KNEE ARTHROPLASTY;  Surgeon: Jacki Cones, MD;  Location: [facility];  Service: Orthopedics;  Laterality: Left;  . Irrigation and debridement knee Left 05/23/2014    Procedure: IRRIGATION AND DEBRIDEMENT KNEE;  Surgeon: [name], MD;  Location: [facility];  Service: Orthopedics;  Laterality: Left;  With POLYETHYLENE EXCHANGE  . Total knee arthroplasty Right 12/23/2014    Procedure: TOTAL KNEE ARTHROPLASTY;  Surgeon: Ranee Gosselin, MD;  Location: [facility];  Service: Orthopedics;  Laterality: Right;    reports that she has never smoked. She has never used smokeless tobacco. She reports that she does not drink alcohol or use illicit drugs. family history includes Alcohol abuse in her mother; Arthritis in her mother; Cancer in her brother, daughter, and maternal grandmother; Heart disease (age of onset: 38) in her father; Hyperlipidemia in her father; Hypertension in her father and sister. Allergies  Allergen Reactions  . Adhesive [Tape] Other (See Comments)    Peeled off skin  . Ceclor [Cefaclor] Other (See Comments)    Blisters on hands  .  Chocolate Other (See Comments)    migraines  . Venlafaxine Other (See Comments)    "refuses to take - makes me feel like I am dying"      Review of Systems  Constitutional: Positive for fatigue. Negative for fever and chills.  HENT: Positive for congestion and sore throat.   Respiratory: Positive for cough. Negative for shortness of breath and wheezing.   Endocrine: Negative for polydipsia and polyuria.       Objective:   Physical Exam  Constitutional: She appears well-developed and well-nourished.  HENT:  Right Ear: External ear normal.  Left eardrum reveals some yellowish discoloration along the inferior  portion. No erythema.  Neck: Neck supple.  Cardiovascular: Normal rate and regular rhythm.   Pulmonary/Chest: Effort normal and breath sounds normal. No respiratory distress. She has no wheezes. She has no rales.  Lymphadenopathy:    She has no cervical adenopathy.          Assessment & Plan:  #1 probable early left acute suppurative otitis. Amoxicillin 875 mg twice daily for 10 days #2 transaminase elevations. Probably related to fatty liver change. Repeat hepatic panel. Also check hepatitis C antibody. She is not sure she's had previous blood transfusions. No other specific risk factors #3 history of hyperglycemia. Repeat hemoglobin A1c. Lose some weight.

## 2015-06-24 NOTE — Patient Instructions (Signed)

## 2015-06-25 ENCOUNTER — Encounter: Payer: Self-pay | Admitting: Family Medicine

## 2015-06-25 ENCOUNTER — Telehealth: Payer: Self-pay | Admitting: Family Medicine

## 2015-06-25 LAB — HEPATITIS C ANTIBODY: HCV Ab: NEGATIVE

## 2015-06-25 NOTE — Telephone Encounter (Signed)
[name] called saying the Amoxicillin she's taking is making her nauseous. She's wondering if Dr. Caryl NeverBurchette will send Doxycycline (sp?) to her pharmacy instead. Please call the pt regarding this.  Pt ph# [phone] Thank you.

## 2015-06-26 ENCOUNTER — Other Ambulatory Visit: Payer: Self-pay | Admitting: Family Medicine

## 2015-06-26 ENCOUNTER — Other Ambulatory Visit: Payer: Self-pay | Admitting: Adult Health

## 2015-06-26 MED ORDER — DOXYCYCLINE HYCLATE 100 MG PO TABS: 100.0000 mg | ORAL_TABLET | Freq: Two times a day (BID) | ORAL | Status: DC

## 2015-06-26 MED ORDER — ONDANSETRON HCL 4 MG PO TABS: 4.0000 mg | ORAL_TABLET | Freq: Four times a day (QID) | ORAL | Status: DC | PRN

## 2015-06-26 NOTE — Telephone Encounter (Signed)
Called and spoke with pt and pt states she was given amoxicillin on 11.30.2016 and it is upsetting her stomach. Pt states she was given a medication for nausea but she did not know if she could mix the 2 medications.  Pt request to have doxycycline.  Per Kandee Keenory pt should continue amoxicillin because it best treats ear infections.  Told pt a prescription for Zofran 4 mg could be sent to the pharmacy.  Pt states that would be better because it will not make her drowsy.  Rx sent to pharmacy.

## 2015-06-26 NOTE — Telephone Encounter (Signed)
DC amoxicillin Doxycycline 100 mg twice a day #14

## 2015-06-26 NOTE — Telephone Encounter (Signed)
Rx sent in and left voicemail for patient to call office to make aware of switch of medication and that it is ready at pharmacy.

## 2015-06-26 NOTE — Telephone Encounter (Signed)
Attempted again to contact patient with update. Left message on voicemail

## 2015-06-29 NOTE — Telephone Encounter (Signed)
Left message again for patient to call office back around update on antibiotics

## 2015-06-30 NOTE — Telephone Encounter (Signed)
Pt did pick up new antibiotic. Please annotate on recent labs.

## 2015-07-06 ENCOUNTER — Other Ambulatory Visit: Payer: Self-pay | Admitting: Family Medicine

## 2015-07-10 ENCOUNTER — Other Ambulatory Visit: Payer: Self-pay | Admitting: Family Medicine

## 2015-07-10 ENCOUNTER — Encounter: Payer: Self-pay | Admitting: Adult Health

## 2015-07-10 MED ORDER — ALPRAZOLAM 0.5 MG PO TABS: ORAL_TABLET | ORAL | Status: DC

## 2015-07-20 ENCOUNTER — Other Ambulatory Visit: Payer: Self-pay | Admitting: Family Medicine

## 2015-09-17 ENCOUNTER — Ambulatory Visit (INDEPENDENT_AMBULATORY_CARE_PROVIDER_SITE_OTHER): Payer: BC Managed Care – PPO | Admitting: Family Medicine

## 2015-09-17 ENCOUNTER — Encounter: Payer: Self-pay | Admitting: Family Medicine

## 2015-09-17 VITALS — BP 120/88 | HR 110 | Temp 97.8°F | Ht 64.0 in | Wt 339.0 lb

## 2015-09-17 DIAGNOSIS — R05 Cough: Secondary | ICD-10-CM

## 2015-09-17 DIAGNOSIS — R059 Cough, unspecified: Secondary | ICD-10-CM

## 2015-09-17 MED ORDER — HYDROCODONE-HOMATROPINE 5-1.5 MG/5ML PO SYRP: 5.0000 mL | ORAL_SOLUTION | Freq: Three times a day (TID) | ORAL | Status: DC | PRN

## 2015-09-17 NOTE — Patient Instructions (Signed)

## 2015-09-17 NOTE — Progress Notes (Signed)
Subjective:    Patient ID: [name], female    DOB: 05-08-1961, 55 y.o.   MRN: [id number]  HPI  Acute visit. Patient is seen with about a 5 day history of some bilateral maxillary facial pain, sore throat, mostly nonproductive cough. Denies any fevers or chills. No body aches. No nausea or vomiting. No sick contacts. Cough is severe at night. Not relieved with over-the-counter medications. Taking Motrin every 8 hours for her sore throat symptoms.  Past Medical History  Diagnosis Date  . Anxiety   . Depression   . Hypertension   . High cholesterol   . GERD (gastroesophageal reflux disease)   . Colon polyps   . Asthma   . PONV (postoperative nausea and vomiting)   . Arthritis   . Anemia     hx of with pregnancy   . Sleep apnea     has not use CPAP machine in 2 yrs/ DOES NOT KNOW IF SHE NEEDS TO USE C - PAP  . Headache     occasional headaches   . Shortness of breath dyspnea     due to weight gain    Past Surgical History  Procedure Laterality Date  . Endometrial ablation w/ novasure    . Carpal tunnel release  2007    both hands  . Trigger finger release surgeyr       bilateral   . Tubal ligation    . Knee arthroscopy      let knee 03/2013   . Knee arthroscopy with medial menisectomy Right 10/22/2013    Procedure: RIGHT KNEE ARTHROSCOPY WITH MEDIAL MENISECTOMY, abrasion chrondrplasty Brigid Re OUT/CORRECT CURETTEMENT/BONE GRAFT/PROXIMAL TIBIA ;  Surgeon: Jacki Cones, MD;  Location: [facility];  Service: Orthopedics;  Laterality: Right;  . Back surgery  2012    lumb fusion  . Colonoscopy    . Excision metacarpal mass Right 03/10/2014    Procedure: EXCISION MASS RIGHT HAND FIRST WEB SPACE DORSAL PALMAR INCISION ;  Surgeon: Cindee Salt, MD;  Location: [facility];  Service: Orthopedics;  Laterality: Right;  . Total knee arthroplasty Left 04/16/2014    Procedure: LEFT TOTAL KNEE ARTHROPLASTY;  Surgeon: Jacki Cones, MD;  Location: [facility];  Service:  Orthopedics;  Laterality: Left;  . Irrigation and debridement knee Left 05/23/2014    Procedure: IRRIGATION AND DEBRIDEMENT KNEE;  Surgeon: [name], MD;  Location: [facility];  Service: Orthopedics;  Laterality: Left;  With POLYETHYLENE EXCHANGE  . Total knee arthroplasty Right 12/23/2014    Procedure: TOTAL KNEE ARTHROPLASTY;  Surgeon: Ranee Gosselin, MD;  Location: [facility];  Service: Orthopedics;  Laterality: Right;    reports that she has never smoked. She has never used smokeless tobacco. She reports that she does not drink alcohol or use illicit drugs. family history includes Alcohol abuse in her mother; Arthritis in her mother; Cancer in her brother, daughter, and maternal grandmother; Heart disease (age of onset: 21) in her father; Hyperlipidemia in her father; Hypertension in her father and sister. Allergies  Allergen Reactions  . Adhesive [Tape] Other (See Comments)    Peeled off skin  . Ceclor [Cefaclor] Other (See Comments)    Blisters on hands  . Chocolate Other (See Comments)    migraines  . Venlafaxine Other (See Comments)    "refuses to take - makes me feel like I am dying"     Review of Systems  Constitutional: Positive for fatigue.  HENT: Positive for congestion and  sore throat.   Respiratory: Positive for cough.        Objective:   Physical Exam  Constitutional: She appears well-developed and well-nourished.  HENT:  Right Ear: External ear normal.  Left Ear: External ear normal.  Minimal erythema. No exudate.  Neck: Neck supple.  Cardiovascular: Normal rate and regular rhythm.   Pulmonary/Chest: Effort normal and breath sounds normal. No respiratory distress. She has no wheezes. She has no rales.  Lymphadenopathy:    She has no cervical adenopathy.          Assessment & Plan:  Cough. Suspect acute viral bronchitis. Nonfocal exam. Hycodan cough syrup 1 teaspoon daily at bedtime for severe cough. Follow-up for fever or persistent coughing

## 2015-09-17 NOTE — Progress Notes (Signed)
Pre visit review using our clinic review tool, if applicable. No additional management support is needed unless otherwise documented below in the visit note. 

## 2015-09-23 ENCOUNTER — Other Ambulatory Visit: Payer: Self-pay | Admitting: Family Medicine

## 2015-10-07 ENCOUNTER — Other Ambulatory Visit: Payer: Self-pay | Admitting: Family Medicine

## 2015-10-07 DIAGNOSIS — R05 Cough: Secondary | ICD-10-CM

## 2015-10-07 DIAGNOSIS — R059 Cough, unspecified: Secondary | ICD-10-CM

## 2015-10-26 ENCOUNTER — Ambulatory Visit: Payer: Self-pay | Admitting: Family Medicine

## 2015-10-29 ENCOUNTER — Ambulatory Visit (INDEPENDENT_AMBULATORY_CARE_PROVIDER_SITE_OTHER): Payer: BC Managed Care – PPO | Admitting: Family Medicine

## 2015-10-29 ENCOUNTER — Encounter: Payer: Self-pay | Admitting: Family Medicine

## 2015-10-29 VITALS — BP 140/85 | HR 95 | Temp 97.4°F | Ht 64.0 in | Wt 340.4 lb

## 2015-10-29 DIAGNOSIS — M25562 Pain in left knee: Secondary | ICD-10-CM | POA: Diagnosis not present

## 2015-10-29 DIAGNOSIS — R7303 Prediabetes: Secondary | ICD-10-CM | POA: Diagnosis not present

## 2015-10-29 DIAGNOSIS — I10 Essential (primary) hypertension: Secondary | ICD-10-CM

## 2015-10-29 DIAGNOSIS — F329 Major depressive disorder, single episode, unspecified: Secondary | ICD-10-CM

## 2015-10-29 DIAGNOSIS — J4520 Mild intermittent asthma, uncomplicated: Secondary | ICD-10-CM | POA: Diagnosis not present

## 2015-10-29 DIAGNOSIS — F32A Depression, unspecified: Secondary | ICD-10-CM

## 2015-10-29 MED ORDER — ALBUTEROL SULFATE HFA 108 (90 BASE) MCG/ACT IN AERS: 2.0000 | INHALATION_SPRAY | Freq: Four times a day (QID) | RESPIRATORY_TRACT | Status: DC | PRN

## 2015-10-29 MED ORDER — PROMETHAZINE HCL 25 MG PO TABS: ORAL_TABLET | ORAL | Status: DC

## 2015-10-29 MED ORDER — FLUTICASONE FUROATE-VILANTEROL 200-25 MCG/INH IN AEPB: 1.0000 | INHALATION_SPRAY | Freq: Every day | RESPIRATORY_TRACT | Status: DC

## 2015-10-29 NOTE — Patient Instructions (Signed)
Great to meet you!  Get Dr. Darrelyn HillockGioffre to send me a note so I stay in the loop   Breo is one puff once a day, it replaces symbicort

## 2015-10-29 NOTE — Progress Notes (Signed)
   HPI  Patient presents today to establish care and discuss knee pain.  Left knee pain Status post 6 knee surgeries, she has an appointment with her orthopedist this afternoon. She was considering having the appointment postponed and requests pain medications to last her until the appointment, I encouraged her not to postpone the appointment.  Asthma Taking Symbicort, has not needed albuterol in over a year. She does complain of barky cough for more than 6 months that's more severe than usual. He's a change from Symbicort due to formulary changes.  Hypertension Moderate medication compliance She feels it's elevated today due to not taking meds this morning and knee pain.  Healthcare maintenance Sees GYN for Pap smears and mammogram, [facility]  PMH: Smoking status noted Her past medical, surgical, social, family history reviewed and updated in EMR ROS: Per HPI  Objective: BP 140/85 mmHg  Pulse 95  Temp(Src) 97.4 F (36.3 C) (Oral)  Ht 5\' 4"  (1.626 m)  Wt 340 lb 6.4 oz (154.404 kg)  BMI 58.40 kg/m2 Gen: NAD, alert, cooperative with exam HEENT: NCAT, TMs normal bilaterally with some scarring of the left TM, CV: RRR, good S1/S2, no murmur Resp: CTABL, no wheezes, non-labored Abd: SNTND, BS present, no guarding or organomegaly Ext: No edema, warm Neuro: Alert and oriented, No gross deficits  MSK BL knees with midline scars, well healed No warmth or concern for infection of L  Assessment and plan:  # Asthma Change from Symbicort to breo, Myrene BuddyYvonne given Education for inhaler also given Albuterol refill 1 inhaler Return to clinic in one month to ensure that she's getting as good benefit from the medication  # Hypertension Borderline today, however likely well controlled when compliance is good Emphasized compliance  # Knee pain Encouraged her to keep her orthopedic appointment, declined pain medication  # Mood disorder,  depression Sees psychiatry, good benefit from clonidine recently, consider recommending 24-hour patch if needed to increase antihypertensives   Refilled phenergan, uses for migraine associated nausea, has also had increased nausea intermittently since her mother's death a few months ago   Meds ordered this encounter  Medications  . cloNIDine (CATAPRES) 0.1 MG tablet    Sig: Take 0.1 mg by mouth 3 (three) times daily.  . fluticasone furoate-vilanterol (BREO ELLIPTA) 200-25 MCG/INH AEPB    Sig: Inhale 1 puff into the lungs daily.    Dispense:  1 each    Refill:  11  . promethazine (PHENERGAN) 25 MG tablet    Sig: TAKE 1 TABLET EVERY 6 HOURS AS NEEDED FOR NAUSEA    Dispense:  30 tablet    Refill:  3    Murtis SinkSam Bradshaw, MD [facility] 10/29/2015, 10:05 AM

## 2015-11-04 ENCOUNTER — Encounter: Payer: Self-pay | Admitting: Family Medicine

## 2015-11-04 MED ORDER — TRAMADOL HCL 50 MG PO TABS: 50.0000 mg | ORAL_TABLET | Freq: Four times a day (QID) | ORAL | Status: DC | PRN

## 2015-11-04 NOTE — Telephone Encounter (Signed)
Scrip for tramadol written. Pt can pick it up.

## 2015-11-04 NOTE — Telephone Encounter (Signed)
Patient was seen 4/6 with Ermalinda MemosBradshaw. You are covering PCP. Please advise and send back to the pools.

## 2015-11-12 ENCOUNTER — Other Ambulatory Visit: Payer: Self-pay | Admitting: *Deleted

## 2015-11-12 MED ORDER — PRAVASTATIN SODIUM 40 MG PO TABS: 40.0000 mg | ORAL_TABLET | Freq: Every day | ORAL | Status: DC

## 2015-11-26 ENCOUNTER — Encounter: Payer: Self-pay | Admitting: *Deleted

## 2015-11-30 ENCOUNTER — Other Ambulatory Visit (HOSPITAL_COMMUNITY): Payer: Self-pay | Admitting: Orthopedic Surgery

## 2015-11-30 ENCOUNTER — Other Ambulatory Visit: Payer: Self-pay | Admitting: Family Medicine

## 2015-11-30 DIAGNOSIS — M25562 Pain in left knee: Secondary | ICD-10-CM

## 2015-12-01 ENCOUNTER — Ambulatory Visit: Payer: BC Managed Care – PPO | Admitting: Family Medicine

## 2015-12-02 ENCOUNTER — Encounter: Payer: Self-pay | Admitting: Family Medicine

## 2015-12-06 ENCOUNTER — Other Ambulatory Visit: Payer: Self-pay | Admitting: Family Medicine

## 2015-12-08 ENCOUNTER — Encounter (HOSPITAL_COMMUNITY)
Admission: RE | Admit: 2015-12-08 | Discharge: 2015-12-08 | Disposition: A | Discharge status: Home / Self Care | Payer: BC Managed Care – PPO | Source: Ambulatory Visit | Attending: Orthopedic Surgery | Admitting: Orthopedic Surgery

## 2015-12-08 DIAGNOSIS — M25562 Pain in left knee: Secondary | ICD-10-CM

## 2015-12-08 MED ORDER — TECHNETIUM TC 99M MEDRONATE IV KIT
25.0000 | PACK | Freq: Once | INTRAVENOUS | Status: AC | PRN
  Administered 2015-12-08: 25 via INTRAVENOUS

## 2015-12-15 ENCOUNTER — Other Ambulatory Visit: Payer: Self-pay

## 2015-12-15 MED ORDER — PRAVASTATIN SODIUM 40 MG PO TABS: 40.0000 mg | ORAL_TABLET | Freq: Every day | ORAL | Status: DC

## 2015-12-16 ENCOUNTER — Ambulatory Visit: Payer: BC Managed Care – PPO | Admitting: Family Medicine

## 2015-12-17 ENCOUNTER — Encounter: Payer: Self-pay | Admitting: Family Medicine

## 2016-01-05 ENCOUNTER — Other Ambulatory Visit: Payer: Self-pay | Admitting: Family Medicine

## 2016-01-18 ENCOUNTER — Other Ambulatory Visit: Payer: Self-pay | Admitting: Family Medicine

## 2016-01-19 ENCOUNTER — Encounter: Payer: Self-pay | Admitting: Family

## 2016-01-19 ENCOUNTER — Ambulatory Visit (INDEPENDENT_AMBULATORY_CARE_PROVIDER_SITE_OTHER): Payer: BC Managed Care – PPO | Admitting: Family

## 2016-01-19 VITALS — BP 130/78 | HR 93 | Temp 97.6°F | Ht 64.0 in | Wt 335.0 lb

## 2016-01-19 DIAGNOSIS — Z6841 Body Mass Index (BMI) 40.0 and over, adult: Secondary | ICD-10-CM | POA: Diagnosis not present

## 2016-01-19 DIAGNOSIS — R519 Headache, unspecified: Secondary | ICD-10-CM

## 2016-01-19 DIAGNOSIS — R51 Headache: Secondary | ICD-10-CM

## 2016-01-19 DIAGNOSIS — F411 Generalized anxiety disorder: Secondary | ICD-10-CM

## 2016-01-19 DIAGNOSIS — R110 Nausea: Secondary | ICD-10-CM | POA: Diagnosis not present

## 2016-01-19 MED ORDER — KETOROLAC TROMETHAMINE 60 MG/2ML IM SOLN
60.0000 mg | Freq: Once | INTRAMUSCULAR | Status: AC
  Administered 2016-01-19: 60 mg via INTRAMUSCULAR

## 2016-01-19 NOTE — Progress Notes (Signed)
Subjective:    Patient ID: [name], female    DOB: 04/17/1961, 55 y.o.   MRN: [id number]  PT presents to the office today with intermittent nausea and anxiety. PT states this is started in December after her mother was killed and has become worse.  Anxiety Presents for follow-up visit. Onset was 1 to 5 years ago. The problem has been waxing and waning. Symptoms include depressed mood, excessive worry, irritability, nausea, nervous/anxious behavior, panic and restlessness. Patient reports no dizziness, palpitations, shortness of breath or suicidal ideas. Symptoms occur most days. The severity of symptoms is moderate. The symptoms are aggravated by family issues. The quality of sleep is good.   Her past medical history is significant for anxiety/panic attacks and depression. Past treatments include SSRIs. Compliance with prior treatments has been good.  Headache  This is a new problem. The current episode started yesterday. The problem has been unchanged. The pain is located in the right unilateral region. The pain does not radiate. The pain quality is not similar to prior headaches. The quality of the pain is described as aching. The pain is at a severity of 6/10. The pain is moderate. Associated symptoms include nausea and phonophobia. Pertinent negatives include no blurred vision, dizziness, eye redness, eye watering, photophobia, sore throat or weakness. The symptoms are aggravated by emotional stress. She has tried NSAIDs for the symptoms. The treatment provided no relief. Her past medical history is significant for migraine headaches.      Review of Systems  Constitutional: Positive for irritability.  HENT: Negative.  Negative for sore throat.   Eyes: Negative.  Negative for blurred vision, photophobia and redness.  Respiratory: Negative.  Negative for shortness of breath.   Cardiovascular: Negative.  Negative for palpitations.  Gastrointestinal: Positive for nausea.    Endocrine: Negative.   Genitourinary: Negative.   Musculoskeletal: Negative.   Neurological: Negative.  Negative for dizziness, weakness and headaches.  Hematological: Negative.   Psychiatric/Behavioral: Negative for suicidal ideas. The patient is nervous/anxious.   All other systems reviewed and are negative.      Objective:   Physical Exam  Constitutional: She is oriented to person, place, and time. She appears well-developed and well-nourished. No distress.  Morbid obesity   HENT:  Head: Normocephalic.  Eyes: Pupils are equal, round, and reactive to light.  Neck: Normal range of motion. Neck supple. No thyromegaly present.  Cardiovascular: Normal rate, regular rhythm, normal heart sounds and intact distal pulses.   No murmur heard. Pulmonary/Chest: Effort normal and breath sounds normal. No respiratory distress. She has no wheezes.  Abdominal: Soft. Bowel sounds are normal. She exhibits no distension. There is no tenderness.  Musculoskeletal: Normal range of motion. She exhibits no edema or tenderness.  Neurological: She is alert and oriented to person, place, and time.  Skin: Skin is warm and dry.  Psychiatric: She has a normal mood and affect. Her behavior is normal. Judgment and thought content normal.  Vitals reviewed.     BP 130/78 mmHg  Pulse 93  Temp(Src) 97.6 F (36.4 C) (Oral)  Ht 5\' 4"  (1.626 m)  Wt 335 lb (151.955 kg)  BMI 57.47 kg/m2     Assessment & Plan:  1. GAD (generalized anxiety disorder) -Continue zoloft and lithium -Keep appts with behavioral health -Stress management discussed  2. Acute nonintractable headache, unspecified headache type -Stress management discussed -Encouraged rest - ketorolac (TORADOL) injection 60 mg; Inject 2 mLs (60 mg total) into the muscle once.  3. Nausea without vomiting -I believe this is related to GAD continue medications  4. Morbid obesity with BMI of 50.0-59.9, adult (HCC) -Encouraged pt to make appt with  Clinical pharmacists -Encourage diet and exercise  RTO prn and Keep appts with PCP  Jannifer Rodneyhristy Hawks, FNP

## 2016-01-19 NOTE — Patient Instructions (Signed)
Generalized Anxiety Disorder Generalized anxiety disorder (GAD) is a mental disorder. It interferes with life functions, including relationships, work, and school. GAD is different from normal anxiety, which everyone experiences at some point in their lives in response to specific life events and activities. Normal anxiety actually helps us prepare for and get through these life events and activities. Normal anxiety goes away after the event or activity is over.  GAD causes anxiety that is not necessarily related to specific events or activities. It also causes excess anxiety in proportion to specific events or activities. The anxiety associated with GAD is also difficult to control. GAD can vary from mild to severe. People with severe GAD can have intense waves of anxiety with physical symptoms (panic attacks).  SYMPTOMS The anxiety and worry associated with GAD are difficult to control. This anxiety and worry are related to many life events and activities and also occur more days than not for 6 months or longer. People with GAD also have three or more of the following symptoms (one or more in children):  Restlessness.   Fatigue.  Difficulty concentrating.   Irritability.  Muscle tension.  Difficulty sleeping or unsatisfying sleep. DIAGNOSIS GAD is diagnosed through an assessment by your health care provider. Your health care provider will ask you questions aboutyour mood,physical symptoms, and events in your life. Your health care provider may ask you about your medical history and use of alcohol or drugs, including prescription medicines. Your health care provider may also do a physical exam and blood tests. Certain medical conditions and the use of certain substances can cause symptoms similar to those associated with GAD. Your health care provider may refer you to a mental health specialist for further evaluation. TREATMENT The following therapies are usually used to treat GAD:    Medication. Antidepressant medication usually is prescribed for long-term daily control. Antianxiety medicines may be added in severe cases, especially when panic attacks occur.   Talk therapy (psychotherapy). Certain types of talk therapy can be helpful in treating GAD by providing support, education, and guidance. A form of talk therapy called cognitive behavioral therapy can teach you healthy ways to think about and react to daily life events and activities.  Stress managementtechniques. These include yoga, meditation, and exercise and can be very helpful when they are practiced regularly. A mental health specialist can help determine which treatment is best for you. Some people see improvement with one therapy. However, other people require a combination of therapies.   This information is not intended to replace advice given to you by your health care provider. Make sure you discuss any questions you have with your health care provider.   Document Released: 11/05/2012 Document Revised: 08/01/2014 Document Reviewed: 11/05/2012 Elsevier Interactive Patient Education 2016 [employer]  

## 2016-01-20 ENCOUNTER — Ambulatory Visit: Payer: Self-pay | Admitting: Pharmacist

## 2016-01-21 ENCOUNTER — Encounter: Payer: Self-pay | Admitting: Family

## 2016-01-21 ENCOUNTER — Encounter: Payer: Self-pay | Admitting: Family Medicine

## 2016-01-29 ENCOUNTER — Ambulatory Visit: Payer: BC Managed Care – PPO | Admitting: Family Medicine

## 2016-02-04 ENCOUNTER — Other Ambulatory Visit: Payer: Self-pay | Admitting: Family Medicine

## 2016-03-01 ENCOUNTER — Other Ambulatory Visit: Payer: Self-pay | Admitting: Family

## 2016-03-24 ENCOUNTER — Other Ambulatory Visit: Payer: Self-pay | Admitting: Family

## 2016-05-27 ENCOUNTER — Other Ambulatory Visit: Payer: Self-pay | Admitting: Family Medicine

## 2016-12-06 ENCOUNTER — Other Ambulatory Visit: Payer: Self-pay | Admitting: Family Medicine

## 2016-12-10 ENCOUNTER — Other Ambulatory Visit: Payer: Self-pay | Admitting: Family Medicine

## 2016-12-19 ENCOUNTER — Other Ambulatory Visit: Payer: Self-pay | Admitting: Family Medicine

## 2016-12-21 ENCOUNTER — Other Ambulatory Visit: Payer: Self-pay | Admitting: Family Medicine

## 2018-04-07 ENCOUNTER — Encounter: Payer: Self-pay | Admitting: *Deleted

## 2018-04-24 ENCOUNTER — Ambulatory Visit (INDEPENDENT_AMBULATORY_CARE_PROVIDER_SITE_OTHER): Payer: BC Managed Care – PPO | Admitting: Psychiatry

## 2018-04-24 DIAGNOSIS — F331 Major depressive disorder, recurrent, moderate: Secondary | ICD-10-CM

## 2018-04-24 MED ORDER — MIRTAZAPINE 30 MG PO TABS: 30.0000 mg | ORAL_TABLET | Freq: Every day | ORAL | 1 refills | Status: DC

## 2018-04-24 NOTE — Progress Notes (Signed)
[facility]  Patient ID: [name],  MRN: [id number]  PCP: Dondra Prader, FNP  Date of Evaluation: 04/24/2018 Time spent:20 minutes   HISTORY/CURRENT STATUS: HPI depression and anxiety same.  Individual Medical History/ Review of Systems: Changes? :No  Allergies: Adhesive [tape]; Ceclor [cefaclor]; Chocolate; and Venlafaxine  Current Medications:  Current Outpatient Medications:  .  albuterol (PROVENTIL HFA;VENTOLIN HFA) 108 (90 Base) MCG/ACT inhaler, Inhale 2 puffs into the lungs every 6 (six) hours as needed for wheezing or shortness of breath., Disp: 1 Inhaler, Rfl: 0 .  amLODipine (NORVASC) 10 MG tablet, TAKE 1 TABLET (10 MG TOTAL) BY MOUTH DAILY., Disp: 30 tablet, Rfl: 5 .  cloNIDine (CATAPRES) 0.1 MG tablet, Take 0.1 mg by mouth 2 (two) times daily. 2 tabs in am  3 tabs hs, Disp: , Rfl:  .  fluticasone furoate-vilanterol (BREO ELLIPTA) 200-25 MCG/INH AEPB, Inhale 1 puff into the lungs daily., Disp: 1 each, Rfl: 11 .  lisinopril (PRINIVIL,ZESTRIL) 20 MG tablet, TAKE 1 TABLET BY MOUTH EVERY DAY, Disp: 30 tablet, Rfl: 5 .  lithium carbonate 300 MG capsule, Take 300 mg by mouth at bedtime. 3 tabs daily, Disp: , Rfl:  .  montelukast (SINGULAIR) 10 MG tablet, TAKE 1 TABLET (10 MG TOTAL) BY MOUTH AT BEDTIME., Disp: 30 tablet, Rfl: 5 .  pravastatin (PRAVACHOL) 40 MG tablet, Take 1 tablet (40 mg total) by mouth at bedtime., Disp: 90 tablet, Rfl: 1 .  promethazine (PHENERGAN) 25 MG tablet, TAKE 1 TABLET EVERY 6 HOURS AS NEEDED FOR NAUSEA, Disp: 30 tablet, Rfl: 0 .  sertraline (ZOLOFT) 100 MG tablet, Take 100 mg by mouth 2 (two) times daily. , Disp: , Rfl:  .  zolpidem (AMBIEN CR) 12.5 MG CR tablet, Take 12.5 mg by mouth at bedtime., Disp: , Rfl:  Medication Side Effects: Other: occasional tremor, but better  Family Medical/ Social History: Changes? No  MENTAL HEALTH EXAM:  There were no vitals taken for this visit.There is no height or weight on file to  calculate BMI.  General Appearance: Casual  Eye Contact:  Good  Speech:  Clear and Coherent  Volume:  Normal  Mood:  Depressed and anxious  Affect:  Appropriate  Thought Process:  Coherent  Orientation:  Full (Time, Place, and Person)  Thought Content: Logical   Suicidal Thoughts:  No  Homicidal Thoughts:  No  Memory:  Immediate  Judgement:  Good  Insight:  Good  Psychomotor Activity:  Normal  Concentration:  Concentration: Good  Recall:  Good  Fund of Knowledge: Good  Language: Good  Akathisia:  NA  AIMS (if indicated): not done  Assets:  Others:  na  ADL's:  Intact  Cognition: WNL  Prognosis:  Good    DIAGNOSES:    ICD-10-CM   1. Major depressive disorder, recurrent episode, moderate (HCC) F33.1     RECOMMENDATIONS: to get lithium level before considering increase Sleep study already order  [employer], PA-C

## 2018-05-09 ENCOUNTER — Other Ambulatory Visit: Payer: Self-pay | Admitting: Psychiatry

## 2018-05-09 MED ORDER — ZOLPIDEM TARTRATE ER 12.5 MG PO TBCR: 12.5000 mg | EXTENDED_RELEASE_TABLET | Freq: Every day | ORAL | 0 refills | Status: DC

## 2018-05-28 ENCOUNTER — Other Ambulatory Visit: Payer: Self-pay | Admitting: Psychiatry

## 2018-05-28 MED ORDER — LITHIUM CARBONATE 300 MG PO CAPS: ORAL_CAPSULE | ORAL | 1 refills | Status: DC

## 2018-06-09 ENCOUNTER — Encounter: Payer: Self-pay | Admitting: Emergency Medicine

## 2018-06-12 ENCOUNTER — Other Ambulatory Visit: Payer: Self-pay

## 2018-06-12 MED ORDER — ZOLPIDEM TARTRATE ER 12.5 MG PO TBCR: 12.5000 mg | EXTENDED_RELEASE_TABLET | Freq: Every day | ORAL | 0 refills | Status: DC

## 2018-06-26 ENCOUNTER — Ambulatory Visit: Payer: BC Managed Care – PPO | Admitting: Psychiatry

## 2018-07-02 ENCOUNTER — Ambulatory Visit: Payer: BC Managed Care – PPO | Admitting: Psychiatry

## 2018-07-06 ENCOUNTER — Ambulatory Visit: Payer: BC Managed Care – PPO | Admitting: Psychiatry

## 2018-07-10 ENCOUNTER — Other Ambulatory Visit: Payer: Self-pay

## 2018-07-10 MED ORDER — ZOLPIDEM TARTRATE ER 12.5 MG PO TBCR: 12.5000 mg | EXTENDED_RELEASE_TABLET | Freq: Every day | ORAL | 0 refills | Status: DC

## 2018-07-24 ENCOUNTER — Ambulatory Visit: Payer: BC Managed Care – PPO | Admitting: Psychiatry

## 2018-07-24 DIAGNOSIS — F32A Depression, unspecified: Secondary | ICD-10-CM

## 2018-07-24 DIAGNOSIS — F329 Major depressive disorder, single episode, unspecified: Secondary | ICD-10-CM | POA: Diagnosis not present

## 2018-07-24 DIAGNOSIS — F331 Major depressive disorder, recurrent, moderate: Secondary | ICD-10-CM

## 2018-07-24 MED ORDER — ZOLPIDEM TARTRATE ER 12.5 MG PO TBCR: 12.5000 mg | EXTENDED_RELEASE_TABLET | Freq: Every day | ORAL | 1 refills | Status: DC

## 2018-07-24 MED ORDER — MIRTAZAPINE 30 MG PO TABS: 30.0000 mg | ORAL_TABLET | Freq: Every day | ORAL | 1 refills | Status: DC

## 2018-07-24 MED ORDER — LITHIUM CARBONATE 300 MG PO CAPS: ORAL_CAPSULE | ORAL | 1 refills | Status: DC

## 2018-07-24 MED ORDER — SERTRALINE HCL 100 MG PO TABS: 100.0000 mg | ORAL_TABLET | Freq: Two times a day (BID) | ORAL | 2 refills | Status: DC

## 2018-07-24 NOTE — Progress Notes (Signed)
[facility]  Patient ID: [name],  MRN: [id number]  PCP: Dondra Praderardone, Christine L, FNP  Date of Evaluation: 07/24/2018 Time spent:20 minutes  Chief Complaint:   HISTORY/CURRENT STATUS: HPI patient was last seen 04/24/2018 but we have no record of that visit. her previous visit her  depression and anxiety were about the same currently she feels depression is about the same she does isolate some and has anhedonia.  Her anxiety is worse but no panic attacks.  He is to get a lithium level and a BMP and a thyroid from her family doctor.  She also is to get a sleep study one has been ordered for her.  Allergies: Adhesive [tape]; Ceclor [cefaclor]; Chocolate; and Venlafaxine  Current Medications:  Current Outpatient Medications:  .  lithium carbonate 300 MG capsule, 3 tabs hs, Disp: 90 capsule, Rfl: 1 .  mirtazapine (REMERON) 30 MG tablet, Take 1 tablet (30 mg total) by mouth at bedtime., Disp: 90 tablet, Rfl: 1 .  sertraline (ZOLOFT) 100 MG tablet, Take 1 tablet (100 mg total) by mouth 2 (two) times daily., Disp: 60 tablet, Rfl: 2 .  zolpidem (AMBIEN CR) 12.5 MG CR tablet, Take 1 tablet (12.5 mg total) by mouth at bedtime., Disp: 30 tablet, Rfl: 1 .  albuterol (PROVENTIL HFA;VENTOLIN HFA) 108 (90 Base) MCG/ACT inhaler, Inhale 2 puffs into the lungs every 6 (six) hours as needed for wheezing or shortness of breath., Disp: 1 Inhaler, Rfl: 0 .  amLODipine (NORVASC) 10 MG tablet, TAKE 1 TABLET (10 MG TOTAL) BY MOUTH DAILY., Disp: 30 tablet, Rfl: 5 .  cloNIDine (CATAPRES) 0.1 MG tablet, Take 0.1 mg by mouth 2 (two) times daily. 2 tabs in am  3 tabs hs, Disp: , Rfl:  .  fluticasone furoate-vilanterol (BREO ELLIPTA) 200-25 MCG/INH AEPB, Inhale 1 puff into the lungs daily., Disp: 1 each, Rfl: 11 .  lisinopril (PRINIVIL,ZESTRIL) 20 MG tablet, TAKE 1 TABLET BY MOUTH EVERY DAY, Disp: 30 tablet, Rfl: 5 .  montelukast (SINGULAIR) 10 MG tablet, TAKE 1 TABLET (10 MG TOTAL) BY MOUTH AT  BEDTIME., Disp: 30 tablet, Rfl: 5 .  pravastatin (PRAVACHOL) 40 MG tablet, Take 1 tablet (40 mg total) by mouth at bedtime., Disp: 90 tablet, Rfl: 1 .  promethazine (PHENERGAN) 25 MG tablet, TAKE 1 TABLET EVERY 6 HOURS AS NEEDED FOR NAUSEA, Disp: 30 tablet, Rfl: 0 Medication Side Effects: none  Family Medical/ Social History: Changes? No  MENTAL HEALTH EXAM:  There were no vitals taken for this visit.There is no height or weight on file to calculate BMI.  General Appearance: Casual  Eye Contact:  Good  Speech:  Clear and Coherent  Volume:  Normal  Mood:  Depressed  Affect:  Appropriate  Thought Process:  Linear  Orientation:  Full (Time, Place, and Person)  Thought Content: Logical   Suicidal Thoughts:  No  Homicidal Thoughts:  none  Memory:  WNL  Judgement:  Good  Insight:  Good  Psychomotor Activity:  Normal  Concentration:  Concentration: Good  Recall:  Good  Fund of Knowledge: Good  Language: Good  Assets:  Desire for Improvement  ADL's:  Intact  Cognition: WNL  Prognosis:  Good    DIAGNOSES:    ICD-10-CM   1. Depression, unspecified depression type F32.9   2. Major depressive disorder, recurrent episode, moderate (HCC) F33.1 mirtazapine (REMERON) 30 MG tablet    Receiving Psychotherapy: No    RECOMMENDATIOn; patient desires to keep the same medications.  These  include lithium 300 mg 3 a day, Zoloft 200 mg a day, Ambien CR 12.5 mg a day, Remeron 30 mg a day.  She is to get her blood work done she has a slip for that that would be lithium level and BMP her PCP will get her thyroid.  She is to return in 2 months.   Anne Fulay Lola Czerwonka, PA-C

## 2018-08-08 ENCOUNTER — Other Ambulatory Visit: Payer: Self-pay | Admitting: Psychiatry

## 2018-08-10 ENCOUNTER — Other Ambulatory Visit: Payer: Self-pay | Admitting: Psychiatry

## 2018-08-27 ENCOUNTER — Other Ambulatory Visit: Payer: Self-pay | Admitting: Psychiatry

## 2018-09-21 ENCOUNTER — Ambulatory Visit: Payer: BC Managed Care – PPO | Admitting: Psychiatry

## 2018-09-25 ENCOUNTER — Telehealth: Payer: Self-pay | Admitting: Psychiatry

## 2018-09-25 ENCOUNTER — Ambulatory Visit (INDEPENDENT_AMBULATORY_CARE_PROVIDER_SITE_OTHER): Payer: BC Managed Care – PPO | Admitting: Psychiatry

## 2018-09-25 DIAGNOSIS — F32A Depression, unspecified: Secondary | ICD-10-CM

## 2018-09-25 DIAGNOSIS — F331 Major depressive disorder, recurrent, moderate: Secondary | ICD-10-CM

## 2018-09-25 DIAGNOSIS — F329 Major depressive disorder, single episode, unspecified: Secondary | ICD-10-CM | POA: Diagnosis not present

## 2018-09-25 MED ORDER — MIRTAZAPINE 30 MG PO TABS: 30.0000 mg | ORAL_TABLET | Freq: Every day | ORAL | 1 refills | Status: DC

## 2018-09-25 MED ORDER — LITHIUM CARBONATE 300 MG PO CAPS: ORAL_CAPSULE | ORAL | 1 refills | Status: DC

## 2018-09-25 MED ORDER — ESZOPICLONE 2 MG PO TABS: 2.0000 mg | ORAL_TABLET | Freq: Every evening | ORAL | 1 refills | Status: DC | PRN

## 2018-09-25 MED ORDER — SERTRALINE HCL 100 MG PO TABS: 100.0000 mg | ORAL_TABLET | Freq: Two times a day (BID) | ORAL | 2 refills | Status: DC

## 2018-09-25 NOTE — Telephone Encounter (Signed)
[name] called to report that the pharmacy did not receive the RX for Lunesta.  Please resubmit the prescription

## 2018-09-25 NOTE — Progress Notes (Signed)
[facility]  Patient ID: [name],  MRN: [id number]  PCP: Dondra Prader, FNP  Date of Evaluation: 09/25/2018 Time spent:25 minutes  Chief Complaint:   HISTORY/CURRENT STATUS: HPI last seen 07/24/2018.  Time she was still having anxiety and insomnia.  No increase in depression. She has not done as well since her last visit.  She is having no depression no suicidal thoughts.  She does have anxiety with panic attacks.  She is cut down the lithium more around from 900 to 300 mg p.o. is 900 has extremity side effects.  Patient feels a sleep is not as good either.  Individual Medical History/ Review of Systems: Changes? :No   Allergies: Adhesive [tape]; Ceclor [cefaclor]; Chocolate; and Venlafaxine  Current Medications:  Current Outpatient Medications:  .  albuterol (PROVENTIL HFA;VENTOLIN HFA) 108 (90 Base) MCG/ACT inhaler, Inhale 2 puffs into the lungs every 6 (six) hours as needed for wheezing or shortness of breath., Disp: 1 Inhaler, Rfl: 0 .  amLODipine (NORVASC) 10 MG tablet, TAKE 1 TABLET (10 MG TOTAL) BY MOUTH DAILY., Disp: 30 tablet, Rfl: 5 .  cloNIDine (CATAPRES) 0.1 MG tablet, TAKE 2 TABLETS EVERY MORNING AND 3 TABLETS EVERY EVENING, Disp: 450 tablet, Rfl: 0 .  fluticasone furoate-vilanterol (BREO ELLIPTA) 200-25 MCG/INH AEPB, Inhale 1 puff into the lungs daily., Disp: 1 each, Rfl: 11 .  lisinopril (PRINIVIL,ZESTRIL) 20 MG tablet, TAKE 1 TABLET BY MOUTH EVERY DAY, Disp: 30 tablet, Rfl: 5 .  lithium carbonate 300 MG capsule, 3 tabs hs, Disp: 90 capsule, Rfl: 1 .  mirtazapine (REMERON) 30 MG tablet, Take 1 tablet (30 mg total) by mouth at bedtime., Disp: 90 tablet, Rfl: 1 .  montelukast (SINGULAIR) 10 MG tablet, TAKE 1 TABLET (10 MG TOTAL) BY MOUTH AT BEDTIME., Disp: 30 tablet, Rfl: 5 .  pravastatin (PRAVACHOL) 40 MG tablet, Take 1 tablet (40 mg total) by mouth at bedtime., Disp: 90 tablet, Rfl: 1 .  promethazine (PHENERGAN) 25 MG tablet, TAKE 1 TABLET  EVERY 6 HOURS AS NEEDED FOR NAUSEA, Disp: 30 tablet, Rfl: 0 .  sertraline (ZOLOFT) 100 MG tablet, Take 1 tablet (100 mg total) by mouth 2 (two) times daily., Disp: 60 tablet, Rfl: 2 .  zolpidem (AMBIEN CR) 12.5 MG CR tablet, TAKE 1 TABLET BY MOUTH AT BEDTIME, Disp: 30 tablet, Rfl: 0 Medication Side Effects: no  Family Medical/ Social History: Changes? no  MENTAL HEALTH EXAM:  There were no vitals taken for this visit.There is no height or weight on file to calculate BMI.  General Appearance: Casual  Eye Contact:  Good  Speech:  Clear and Coherent  Volume:  Normal  Mood:  Anxious  Affect:  Appropriate  Thought Process:  Linear  Orientation:  Full (Time, Place, and Person)  Thought Content: Logical   Suicidal Thoughts:  No  Homicidal Thoughts:  No  Memory:  WNL  Judgement:  Good  Insight:  Good  Psychomotor Activity:  Normal  Concentration:  Concentration: Good  Recall:  Good  Fund of Knowledge: Good  Language: Good  Assets:  Desire for Improvement  ADL's:  Intact  Cognition: WNL  Prognosis:  Good    DIAGNOSES: No diagnosis found.  Receiving Psychotherapy: No    RECOMMENDATIONS: Patient is to increase her lithium back to 3 pills at night.  I have written an order that she should have the branded generic that she had 2 months ago. We are discontinuing her Ambien CR and starting Lunesta 2 mg  at bedtime.  She will continue on the Remeron for sleep and for anxiety.  She will have labs from her PCP sent to [place].  She also is scheduled for a sleep study in the near future and she will get [place] a copy of that to she is to continue her Zoloft 200 mg a day She is return in 1 month.   Anne Fu, PA-C

## 2018-09-26 ENCOUNTER — Other Ambulatory Visit: Payer: Self-pay | Admitting: Psychiatry

## 2018-09-26 ENCOUNTER — Other Ambulatory Visit: Payer: Self-pay

## 2018-09-26 DIAGNOSIS — F331 Major depressive disorder, recurrent, moderate: Secondary | ICD-10-CM

## 2018-09-26 MED ORDER — ESZOPICLONE 2 MG PO TABS: 2.0000 mg | ORAL_TABLET | Freq: Every evening | ORAL | 1 refills | Status: DC | PRN

## 2018-09-26 MED ORDER — LITHIUM CARBONATE 300 MG PO TABS: 900.0000 mg | ORAL_TABLET | Freq: Every day | ORAL | 1 refills | Status: DC

## 2018-09-26 NOTE — Progress Notes (Signed)
lunesta 2 mg, eprescribed

## 2018-09-27 ENCOUNTER — Telehealth: Payer: Self-pay | Admitting: Psychiatry

## 2018-09-27 ENCOUNTER — Other Ambulatory Visit: Payer: Self-pay | Admitting: Psychiatry

## 2018-09-27 ENCOUNTER — Telehealth: Payer: Self-pay

## 2018-09-27 MED ORDER — ESZOPICLONE 2 MG PO TABS: 2.0000 mg | ORAL_TABLET | Freq: Every evening | ORAL | 1 refills | Status: DC | PRN

## 2018-09-27 NOTE — Telephone Encounter (Signed)
Per [facility] Pupa at [facility] [place] the script for Lunesta (Eszopiclone) was deleted, please re-send [facility] [place]

## 2018-09-27 NOTE — Telephone Encounter (Signed)
I sent Lunesta rx.

## 2018-09-27 NOTE — Telephone Encounter (Signed)
Prior authorization submitted for Eszopiclone 2mg  through [facility] approved effective 09/26/2018-09/25/2021  [facility]ses [facility]

## 2018-10-05 ENCOUNTER — Other Ambulatory Visit: Payer: Self-pay | Admitting: Psychiatry

## 2018-10-05 ENCOUNTER — Telehealth: Payer: Self-pay | Admitting: Psychiatry

## 2018-10-05 MED ORDER — ZOLPIDEM TARTRATE ER 12.5 MG PO TBCR
12.5000 mg | EXTENDED_RELEASE_TABLET | Freq: Every day | ORAL | 0 refills | Status: DC
Start: 2018-10-05 — End: 2018-10-05

## 2018-10-05 MED ORDER — ZOLPIDEM TARTRATE ER 12.5 MG PO TBCR: 12.5000 mg | EXTENDED_RELEASE_TABLET | Freq: Every evening | ORAL | 0 refills | Status: DC | PRN

## 2018-10-05 NOTE — Telephone Encounter (Signed)
Guerline called to report that the Alfonso Patten does not work at all.  She is sleeping at all.  Maybe 1-2 hours only.  Please change her back to the Ambien she was on before.  Next appt 10/22/18.  [facility] to [facility]

## 2018-10-05 NOTE — Progress Notes (Signed)
Patient called stating that the Alfonso Patten was not working for her sleep she was only getting 1 to 2 hours a night and wanted to return to Ambien CR.  Therefore sent in prescription for Ambien CR 12.5 mg 1 nightly.  #30 no refill

## 2018-10-18 ENCOUNTER — Other Ambulatory Visit: Payer: Self-pay | Admitting: Psychiatry

## 2018-10-19 ENCOUNTER — Telehealth: Payer: Self-pay | Admitting: Psychiatry

## 2018-10-19 ENCOUNTER — Other Ambulatory Visit: Payer: Self-pay

## 2018-10-19 MED ORDER — LITHIUM CARBONATE 300 MG PO TABS: 900.0000 mg | ORAL_TABLET | Freq: Every day | ORAL | 1 refills | Status: DC

## 2018-10-19 NOTE — Telephone Encounter (Signed)
Pt called need refill on Clonidine (Catapres) 0.1 mg 2 tab in morning and 3 tab in evening. [facility] [place] [phone]

## 2018-10-19 NOTE — Telephone Encounter (Signed)
rx submitted.  

## 2018-10-22 ENCOUNTER — Ambulatory Visit: Payer: BC Managed Care – PPO | Admitting: Psychiatry

## 2018-11-05 ENCOUNTER — Other Ambulatory Visit: Payer: Self-pay | Admitting: Psychiatry

## 2018-11-06 NOTE — Telephone Encounter (Signed)
Last fill 10/13/2018

## 2018-11-07 ENCOUNTER — Other Ambulatory Visit: Payer: Self-pay | Admitting: Physician Assistant

## 2018-11-07 ENCOUNTER — Other Ambulatory Visit: Payer: Self-pay | Admitting: Psychiatry

## 2018-11-07 NOTE — Telephone Encounter (Signed)
Last fill 03/21

## 2018-11-08 ENCOUNTER — Other Ambulatory Visit: Payer: Self-pay

## 2018-11-08 ENCOUNTER — Other Ambulatory Visit: Payer: Self-pay | Admitting: Physician Assistant

## 2018-11-08 MED ORDER — ZOLPIDEM TARTRATE ER 12.5 MG PO TBCR: 12.5000 mg | EXTENDED_RELEASE_TABLET | Freq: Every evening | ORAL | 1 refills | Status: DC | PRN

## 2018-11-23 ENCOUNTER — Ambulatory Visit: Payer: BC Managed Care – PPO | Admitting: Psychiatry

## 2018-12-03 ENCOUNTER — Encounter: Payer: Self-pay | Admitting: Psychiatry

## 2018-12-03 ENCOUNTER — Other Ambulatory Visit: Payer: Self-pay

## 2018-12-03 ENCOUNTER — Ambulatory Visit (INDEPENDENT_AMBULATORY_CARE_PROVIDER_SITE_OTHER): Payer: BC Managed Care – PPO | Admitting: Psychiatry

## 2018-12-03 DIAGNOSIS — F411 Generalized anxiety disorder: Secondary | ICD-10-CM | POA: Diagnosis not present

## 2018-12-03 DIAGNOSIS — F5105 Insomnia due to other mental disorder: Secondary | ICD-10-CM | POA: Diagnosis not present

## 2018-12-03 DIAGNOSIS — F331 Major depressive disorder, recurrent, moderate: Secondary | ICD-10-CM

## 2018-12-03 MED ORDER — ZOLPIDEM TARTRATE ER 12.5 MG PO TBCR: 12.5000 mg | EXTENDED_RELEASE_TABLET | Freq: Every evening | ORAL | 3 refills | Status: DC | PRN

## 2018-12-03 NOTE — Progress Notes (Signed)
[name] [id number] [birth year] 58 y.o.  Virtual Visit via Telephone Note  I connected with@ on 12/03/18 at  2:15 PM EDT by telephone and verified that I am speaking with the correct person using two identifiers.   I discussed the limitations, risks, security and privacy concerns of performing an evaluation and management service by telephone and the availability of in person appointments. I also discussed with the patient that there may be a patient responsible charge related to this service. The patient expressed understanding and agreed to proceed.   I discussed the assessment and treatment plan with the patient. The patient was provided an opportunity to ask questions and all were answered. The patient agreed with the plan and demonstrated an understanding of the instructions.   The patient was advised to call back or seek an in-person evaluation if the symptoms worsen or if the condition fails to improve as anticipated.  I provided 15 minutes of non-face-to-face time during this encounter.  The patient was located at home.  The provider was located at home.   Lauraine Rinne, MD   Subjective:   Patient ID:  [name] is a 58 y.o. (DOB 1960-12-29) female.  Chief Complaint:  Chief Complaint  Patient presents with  . Follow-up    Medication Management  . Depression    Medication Management  . Sleeping Problem    HPI [name] presents for follow-up of depression anxiety and insomnia.  Chart reviewed appears the patient reduce the lithium from 900 to 300 mg daily due to perhaps tremor or some other side effect.  She had been switched to capsule and had nausea but doesn't have the problems with the lithium tablets.  Over recent months she has been complaining of insomnia.  But it's better now.  Overall OK.  Doesn't like QT but OK.    Sleep better with Ambien and mirtazapine together it's fine. Patient reports stable mood and denies depressed or  irritable moods.  Patient denies any recent difficulty with anxiety.  Rare anxiety attack.  Mostly brought on by Covid and keeping gkids 8 and 56 yo.  Patient denies difficulty with sleep initiation or maintenance. Denies appetite disturbance.  Patient reports that energy and motivation have been good.  Patient denies any difficulty with concentration.  Patient denies any suicidal ideation. Sad over death of PA [employer].   Past Psychiatric Medication Trials: Lithium 900 SE, mirtazapine, clonidine 0 point 2 AM and 0.3 at bedtime, Ambien CR, sertraline, alprazolam, Lunesta NR Looks like she has been on sertraline plus lithium since about 2016   Review of Systems:  Review of Systems  Respiratory: Positive for wheezing.   Neurological: Negative for tremors and weakness.    Medications: I have reviewed the patient's current medications.  Current Outpatient Medications  Medication Sig Dispense Refill  . albuterol (PROVENTIL HFA;VENTOLIN HFA) 108 (90 Base) MCG/ACT inhaler Inhale 2 puffs into the lungs every 6 (six) hours as needed for wheezing or shortness of breath. 1 Inhaler 0  . amLODipine (NORVASC) 10 MG tablet TAKE 1 TABLET (10 MG TOTAL) BY MOUTH DAILY. 30 tablet 5  . cloNIDine (CATAPRES) 0.1 MG tablet TAKE 2 TABLETS EVERY MORNING AND 3 TABLETS EVERY EVENING 450 tablet 0  . DUEXIS 800-26.6 MG TABS Take 1 tablet by mouth 3 (three) times daily.    . fluticasone furoate-vilanterol (BREO ELLIPTA) 200-25 MCG/INH AEPB Inhale 1 puff into the lungs daily. 1 each 11  .  lisinopril (PRINIVIL,ZESTRIL) 20 MG tablet TAKE 1 TABLET BY MOUTH EVERY DAY 30 tablet 5  . lithium 300 MG tablet Take 3 tablets (900 mg total) by mouth at bedtime. 270 tablet 1  . mirtazapine (REMERON) 30 MG tablet Take 1 tablet (30 mg total) by mouth at bedtime. 90 tablet 1  . montelukast (SINGULAIR) 10 MG tablet TAKE 1 TABLET (10 MG TOTAL) BY MOUTH AT BEDTIME. 30 tablet 5  . pravastatin (PRAVACHOL) 40 MG tablet Take 1 tablet (40  mg total) by mouth at bedtime. 90 tablet 1  . promethazine (PHENERGAN) 25 MG tablet TAKE 1 TABLET EVERY 6 HOURS AS NEEDED FOR NAUSEA 30 tablet 0  . rizatriptan (MAXALT) 10 MG tablet TAKE ONE TABLET AS NEEDED FOR MIGRAINE FOR UP TO 1 DOSE. MAY REPEAT IN 2 HOURS IF NEEDED    . sertraline (ZOLOFT) 100 MG tablet Take 1 tablet (100 mg total) by mouth 2 (two) times daily. 60 tablet 2  . zolpidem (AMBIEN CR) 12.5 MG CR tablet Take 1 tablet (12.5 mg total) by mouth at bedtime as needed for sleep. 30 tablet 1   No current facility-administered medications for this visit.     Medication Side Effects: None had nausea on lithium capsules but not on lithium tablets  Allergies:  Allergies  Allergen Reactions  . Adhesive [Tape] Other (See Comments)    Peeled off skin  . Ceclor [Cefaclor] Other (See Comments)    Blisters on hands  . Chocolate Other (See Comments)    migraines  . Venlafaxine Other (See Comments)    "refuses to take - makes me feel like I am dying"    Past Medical History:  Diagnosis Date  . Anemia    hx of with pregnancy   . Anxiety   . Arthritis   . Asthma   . Colon polyps   . Depression   . GERD (gastroesophageal reflux disease)   . Headache    occasional headaches   . High cholesterol   . Hypertension   . PONV (postoperative nausea and vomiting)   . Shortness of breath dyspnea    due to weight gain   . Sleep apnea    has not use CPAP machine in 2 yrs/ DOES NOT KNOW IF SHE NEEDS TO USE C - PAP    Family History  Problem Relation Age of Onset  . Alcohol abuse Mother   . Arthritis Mother   . Heart disease Mother   . Hyperlipidemia Father   . Hypertension Father   . Heart disease Father 63       CABG  . Gout Father   . Hypertension Sister   . Cancer Brother        colon, lung, liver  . Cancer Maternal Grandmother        breast  . Cancer Daughter        NEUROBLASTOMA    Social History   Socioeconomic History  . Marital status: Married    Spouse name:  Not on file  . Number of children: Not on file  . Years of education: Not on file  . Highest education level: Not on file  Occupational History  . Not on file  Social Needs  . Financial resource strain: Not on file  . Food insecurity:    Worry: Not on file    Inability: Not on file  . Transportation needs:    Medical: Not on file    Non-medical: Not on file  Tobacco Use  .  Smoking status: Never Smoker  . Smokeless tobacco: Never Used  Substance and Sexual Activity  . Alcohol use: No  . Drug use: No  . Sexual activity: Not on file  Lifestyle  . Physical activity:    Days per week: Not on file    Minutes per session: Not on file  . Stress: Not on file  Relationships  . Social connections:    Talks on phone: Not on file    Gets together: Not on file    Attends religious service: Not on file    Active member of club or organization: Not on file    Attends meetings of clubs or organizations: Not on file    Relationship status: Not on file  . Intimate partner violence:    Fear of current or ex partner: Not on file    Emotionally abused: Not on file    Physically abused: Not on file    Forced sexual activity: Not on file  Other Topics Concern  . Not on file  Social History Narrative  . Not on file    Past Medical History, Surgical history, Social history, and Family history were reviewed and updated as appropriate.   Please see review of systems for further details on the patient's review from today.   Objective:   Physical Exam:  There were no vitals taken for this visit.  Physical Exam Neurological:     Mental Status: She is alert and oriented to person, place, and time.     Cranial Nerves: No dysarthria.  Psychiatric:        Attention and Perception: Attention normal.        Mood and Affect: Mood normal.        Speech: Speech normal.        Behavior: Behavior is cooperative.        Thought Content: Thought content normal. Thought content is not paranoid or  delusional. Thought content does not include homicidal or suicidal ideation. Thought content does not include homicidal or suicidal plan.        Cognition and Memory: Cognition and memory normal.        Judgment: Judgment normal.     Lab Review:     Component Value Date/Time   NA 133 (L) 12/25/2014 0425   K 4.0 12/25/2014 0425   CL 103 12/25/2014 0425   CO2 23 12/25/2014 0425   GLUCOSE 120 (H) 12/25/2014 0425   BUN 11 12/25/2014 0425   CREATININE 0.87 12/25/2014 0425   CALCIUM 8.6 (L) 12/25/2014 0425   PROT 7.6 06/24/2015 1108   ALBUMIN 4.0 06/24/2015 1108   AST 21 06/24/2015 1108   ALT 20 06/24/2015 1108   ALKPHOS 99 06/24/2015 1108   BILITOT 0.4 06/24/2015 1108   GFRNONAA >60 12/25/2014 0425   GFRAA >60 12/25/2014 0425       Component Value Date/Time   WBC 11.1 (H) 12/25/2014 0425   RBC 3.63 (L) 12/25/2014 0425   HGB 10.2 (L) 12/25/2014 0425   HCT 31.8 (L) 12/25/2014 0425   PLT 230 12/25/2014 0425   MCV 87.6 12/25/2014 0425   MCH 28.1 12/25/2014 0425   MCHC 32.1 12/25/2014 0425   RDW 14.9 12/25/2014 0425   LYMPHSABS 1.6 12/17/2014 0830   MONOABS 0.6 12/17/2014 0830   EOSABS 0.2 12/17/2014 0830   BASOSABS 0.0 12/17/2014 0830    No results found for: POCLITH, LITHIUM   No results found for: PHENYTOIN, PHENOBARB, VALPROATE, CBMZ   Lithium level  per PCP about 3 mos ago and ok by her report.  Get level from them.  .res Assessment: Plan:    Major depressive disorder, recurrent episode, moderate (HCC)  Generalized anxiety disorder  Insomnia due to mental condition   Overall she feels satisfied with the medication and feels that she is doing as well emotionally and she can under the circumstances of the Covid virus.  That has created additional stress and occasional insomnia.  Generally she sleeps well with the combination of mirtazapine plus Ambien.  She is tolerating the medications well doing that.  We discussed side effect risk including risk of amnesia and  falls in the middle of the night.  She will be cautious about that and has not had these difficulties.  Counseled patient regarding potential benefits, risks, and side effects of lithium to include potential risk of lithium affecting thyroid and renal function.  Discussed need for periodic lab monitoring to determine drug level and to assess for potential adverse effects.  Counseled patient regarding signs and symptoms of lithium toxicity and advised that they notify office immediately or seek urgent medical attention if experiencing these signs and symptoms.  Patient advised to contact office with any questions or concerns. She will get us a copy of her last lithium level.  No med changes today  Follow-up 3 to 4 months  Meredith Staggersarey Cottle, MD, DFAPA  Please see After Visit Summary for patient specific instructions.  No future appointments.  No orders of the defined types were placed in this encounter.     -------------------------------

## 2018-12-27 ENCOUNTER — Other Ambulatory Visit: Payer: Self-pay

## 2018-12-27 DIAGNOSIS — F331 Major depressive disorder, recurrent, moderate: Secondary | ICD-10-CM

## 2018-12-27 MED ORDER — MIRTAZAPINE 30 MG PO TABS: 30.0000 mg | ORAL_TABLET | Freq: Every day | ORAL | 1 refills | Status: DC

## 2019-01-28 ENCOUNTER — Other Ambulatory Visit: Payer: Self-pay

## 2019-01-28 MED ORDER — CLONIDINE HCL 0.1 MG PO TABS
ORAL_TABLET | ORAL | 0 refills | Status: DC
Start: 2019-01-28 — End: 2019-04-22

## 2019-02-08 ENCOUNTER — Emergency Department (HOSPITAL_COMMUNITY): Payer: BC Managed Care – PPO

## 2019-02-08 ENCOUNTER — Other Ambulatory Visit: Payer: Self-pay

## 2019-02-08 ENCOUNTER — Encounter (HOSPITAL_COMMUNITY): Payer: Self-pay | Admitting: Emergency Medicine

## 2019-02-08 ENCOUNTER — Emergency Department (HOSPITAL_COMMUNITY)
Admission: EM | Admit: 2019-02-08 | Discharge: 2019-02-08 | Disposition: A | Discharge status: Home / Self Care | Payer: BC Managed Care – PPO | Attending: Emergency Medicine | Admitting: Emergency Medicine

## 2019-02-08 DIAGNOSIS — R531 Weakness: Secondary | ICD-10-CM

## 2019-02-08 DIAGNOSIS — R911 Solitary pulmonary nodule: Secondary | ICD-10-CM

## 2019-02-08 DIAGNOSIS — R0789 Other chest pain: Secondary | ICD-10-CM | POA: Diagnosis not present

## 2019-02-08 DIAGNOSIS — Z79899 Other long term (current) drug therapy: Secondary | ICD-10-CM | POA: Diagnosis not present

## 2019-02-08 DIAGNOSIS — Z20828 Contact with and (suspected) exposure to other viral communicable diseases: Secondary | ICD-10-CM | POA: Insufficient documentation

## 2019-02-08 DIAGNOSIS — Z96651 Presence of right artificial knee joint: Secondary | ICD-10-CM | POA: Diagnosis not present

## 2019-02-08 DIAGNOSIS — R55 Syncope and collapse: Secondary | ICD-10-CM | POA: Diagnosis present

## 2019-02-08 DIAGNOSIS — M79661 Pain in right lower leg: Secondary | ICD-10-CM | POA: Diagnosis not present

## 2019-02-08 DIAGNOSIS — I10 Essential (primary) hypertension: Secondary | ICD-10-CM | POA: Insufficient documentation

## 2019-02-08 DIAGNOSIS — J4520 Mild intermittent asthma, uncomplicated: Secondary | ICD-10-CM | POA: Insufficient documentation

## 2019-02-08 LAB — BLOOD GAS, VENOUS
Acid-Base Excess: 1.5 mmol/L (ref 0.0–2.0)
Bicarbonate: 26.3 mmol/L (ref 20.0–28.0)
O2 Saturation: 80.9 %
Patient temperature: 98.6
pCO2, Ven: 45 mmHg (ref 44.0–60.0)
pH, Ven: 7.386 (ref 7.250–7.430)
pO2, Ven: 44.8 mmHg (ref 32.0–45.0)

## 2019-02-08 LAB — BASIC METABOLIC PANEL
Anion gap: 13 (ref 5–15)
BUN: 14 mg/dL (ref 6–20)
CO2: 24 mmol/L (ref 22–32)
Calcium: 9.5 mg/dL (ref 8.9–10.3)
Chloride: 102 mmol/L (ref 98–111)
Creatinine, Ser: 0.99 mg/dL (ref 0.44–1.00)
GFR calc Af Amer: >60 mL/min (ref >60)
GFR calc non Af Amer: >60 mL/min (ref >60)
Glucose, Bld: 99 mg/dL (ref 70–99)
Potassium: 3.8 mmol/L (ref 3.5–5.1)
Sodium: 139 mmol/L (ref 135–145)

## 2019-02-08 LAB — CBC WITH DIFFERENTIAL/PLATELET
Abs Immature Granulocytes: 0.03 10*3/uL (ref 0.00–0.07)
Basophils Absolute: 0 10*3/uL (ref 0.0–0.1)
Basophils Relative: 0 %
Eosinophils Absolute: 0.3 10*3/uL (ref 0.0–0.5)
Eosinophils Relative: 3 %
HCT: 40.4 % (ref 36.0–46.0)
Hemoglobin: 12.8 g/dL (ref 12.0–15.0)
Immature Granulocytes: 0 %
Lymphocytes Relative: 19 %
Lymphs Abs: 1.6 10*3/uL (ref 0.7–4.0)
MCH: 29.3 pg (ref 26.0–34.0)
MCHC: 31.7 g/dL (ref 30.0–36.0)
MCV: 92.4 fL (ref 80.0–100.0)
Monocytes Absolute: 0.5 10*3/uL (ref 0.1–1.0)
Monocytes Relative: 6 %
Neutro Abs: 6 10*3/uL (ref 1.7–7.7)
Neutrophils Relative %: 72 %
Platelets: 204 10*3/uL (ref 150–400)
RBC: 4.37 MIL/uL (ref 3.87–5.11)
RDW: 14.4 % (ref 11.5–15.5)
WBC: 8.5 10*3/uL (ref 4.0–10.5)
nRBC: 0 % (ref 0.0–0.2)

## 2019-02-08 LAB — D-DIMER, QUANTITATIVE: D-Dimer, Quant: 0.66 ug/mL-FEU — ABNORMAL HIGH (ref 0.00–0.50)

## 2019-02-08 LAB — TROPONIN I (HIGH SENSITIVITY): Troponin I (High Sensitivity): 5 ng/L (ref <18)

## 2019-02-08 LAB — SARS CORONAVIRUS 2 BY RT PCR (HOSPITAL ORDER, PERFORMED IN ~~LOC~~ HOSPITAL LAB): SARS Coronavirus 2: NEGATIVE

## 2019-02-08 MED ORDER — IOHEXOL 350 MG/ML SOLN
100.0000 mL | Freq: Once | INTRAVENOUS | Status: AC | PRN
  Administered 2019-02-08: 100 mL via INTRAVENOUS

## 2019-02-08 MED ORDER — LORAZEPAM 2 MG/ML IJ SOLN
1.0000 mg | Freq: Once | INTRAMUSCULAR | Status: AC
  Administered 2019-02-08: 1 mg via INTRAVENOUS
  Filled 2019-02-08: qty 1

## 2019-02-08 MED ORDER — SODIUM CHLORIDE (PF) 0.9 % IJ SOLN
INTRAMUSCULAR | Status: AC
  Filled 2019-02-08: qty 50

## 2019-02-08 NOTE — ED Triage Notes (Addendum)
Pt c/o left hip pain that radiates down left leg since yesterday making her left foot felt heavy and hard to pick up all day yesterday causing her to fall today. Her husband feels patients speech is slurred and patient takes to much Ambien. Pt A&O x4. Pt states that she has had 6 surgeries on left leg "trying to get that knee to work". Pt has abrasion to right lower leg from fall this morning. Denies LOC.

## 2019-02-08 NOTE — ED Notes (Signed)
Patient transported to CT 

## 2019-02-08 NOTE — Discharge Instructions (Addendum)
Stay hydrated   See your doctor   You have a lung nodule that can be followed up in several months   Return to ER if you have another episode of passing out, chest pain, shortness of breath, trouble with speech.   You are tested for COVID with results pending. See below for quarantine information. You will be called for positive results      Person Under Monitoring Name: [name]  Location: [place] [address]   Infection Prevention Recommendations for Individuals Confirmed to have, or Being Evaluated for, 2019 Novel Coronavirus (COVID-19) Infection Who Receive Care at Home  Individuals who are confirmed to have, or are being evaluated for, COVID-19 should follow the prevention steps below until a healthcare provider or local or state health department says they can return to normal activities.  Stay home except to get medical care You should restrict activities outside your home, except for getting medical care. Do not go to work, school, or public areas, and do not use public transportation or taxis.  Call ahead before visiting your doctor Before your medical appointment, call the healthcare provider and tell them that you have, or are being evaluated for, COVID-19 infection. This will help the healthcare providers office take steps to keep other people from getting infected. Ask your healthcare provider to call the local or state health department.  Monitor your symptoms Seek prompt medical attention if your illness is worsening (e.g., difficulty breathing). Before going to your medical appointment, call the healthcare provider and tell them that you have, or are being evaluated for, COVID-19 infection. Ask your healthcare provider to call the local or state health department.  Wear a facemask You should wear a facemask that covers your nose and mouth when you are in the same room with other people and when you visit a healthcare provider. People who live  with or visit you should also wear a facemask while they are in the same room with you.  Separate yourself from other people in your home As much as possible, you should stay in a different room from other people in your home. Also, you should use a separate bathroom, if available.  Avoid sharing household items You should not share dishes, drinking glasses, cups, eating utensils, towels, bedding, or other items with other people in your home. After using these items, you should wash them thoroughly with soap and water.  Cover your coughs and sneezes Cover your mouth and nose with a tissue when you cough or sneeze, or you can cough or sneeze into your sleeve. Throw used tissues in a lined trash can, and immediately wash your hands with soap and water for at least 20 seconds or use an alcohol-based hand rub.  Wash your [facility] your hands often and thoroughly with soap and water for at least 20 seconds. You can use an alcohol-based hand sanitizer if soap and water are not available and if your hands are not visibly dirty. Avoid touching your eyes, nose, and mouth with unwashed hands.   Prevention Steps for Caregivers and Household Members of Individuals Confirmed to have, or Being Evaluated for, COVID-19 Infection Being Cared for in the Home  If you live with, or provide care at home for, a person confirmed to have, or being evaluated for, COVID-19 infection please follow these guidelines to prevent infection:  Follow healthcare providers instructions Make sure that you understand and can help the patient follow any healthcare provider instructions for  all care.  Provide for the patients basic needs You should help the patient with basic needs in the home and provide support for getting groceries, prescriptions, and other personal needs.  Monitor the patients symptoms If they are getting sicker, call his or her medical provider and tell them that the patient has, or is being  evaluated for, COVID-19 infection. This will help the healthcare providers office take steps to keep other people from getting infected. Ask the healthcare provider to call the local or state health department.  Limit the number of people who have contact with the patient If possible, have only one caregiver for the patient. Other household members should stay in another home or place of residence. If this is not possible, they should stay in another room, or be separated from the patient as much as possible. Use a separate bathroom, if available. Restrict visitors who do not have an essential need to be in the home.  Keep older adults, very young children, and other sick people away from the patient Keep older adults, very young children, and those who have compromised immune systems or chronic health conditions away from the patient. This includes people with chronic heart, lung, or kidney conditions, diabetes, and cancer.  Ensure good ventilation Make sure that shared spaces in the home have good air flow, such as from an air conditioner or an opened window, weather permitting.  Wash your hands often Wash your hands often and thoroughly with soap and water for at least 20 seconds. You can use an alcohol based hand sanitizer if soap and water are not available and if your hands are not visibly dirty. Avoid touching your eyes, nose, and mouth with unwashed hands. Use disposable paper towels to dry your hands. If not available, use dedicated cloth towels and replace them when they become wet.  Wear a facemask and gloves Wear a disposable facemask at all times in the room and gloves when you touch or have contact with the patients blood, body fluids, and/or secretions or excretions, such as sweat, saliva, sputum, nasal mucus, vomit, urine, or feces.  Ensure the mask fits over your nose and mouth tightly, and do not touch it during use. Throw out disposable facemasks and gloves after using  them. Do not reuse. Wash your hands immediately after removing your facemask and gloves. If your personal clothing becomes contaminated, carefully remove clothing and launder. Wash your hands after handling contaminated clothing. Place all used disposable facemasks, gloves, and other waste in a lined container before disposing them with other household waste. Remove gloves and wash your hands immediately after handling these items.  Do not share dishes, glasses, or other household items with the patient Avoid sharing household items. You should not share dishes, drinking glasses, cups, eating utensils, towels, bedding, or other items with a patient who is confirmed to have, or being evaluated for, COVID-19 infection. After the person uses these items, you should wash them thoroughly with soap and water.  Wash laundry thoroughly Immediately remove and wash clothes or bedding that have blood, body fluids, and/or secretions or excretions, such as sweat, saliva, sputum, nasal mucus, vomit, urine, or feces, on them. Wear gloves when handling laundry from the patient. Read and follow directions on labels of laundry or clothing items and detergent. In general, wash and dry with the warmest temperatures recommended on the label.  Clean all areas the individual has used often Clean all touchable surfaces, such as counters, tabletops, doorknobs, bathroom fixtures, toilets,  phones, keyboards, tablets, and bedside tables, every day. Also, clean any surfaces that may have blood, body fluids, and/or secretions or excretions on them. Wear gloves when cleaning surfaces the patient has come in contact with. Use a diluted bleach solution (e.g., dilute bleach with 1 part bleach and 10 parts water) or a household disinfectant with a label that says EPA-registered for coronaviruses. To make a bleach solution at home, add 1 tablespoon of bleach to 1 quart (4 cups) of water. For a larger supply, add  cup of bleach to 1  gallon (16 cups) of water. Read labels of cleaning products and follow recommendations provided on product labels. Labels contain instructions for safe and effective use of the cleaning product including precautions you should take when applying the product, such as wearing gloves or eye protection and making sure you have good ventilation during use of the product. Remove gloves and wash hands immediately after cleaning.  Monitor yourself for signs and symptoms of illness Caregivers and household members are considered close contacts, should monitor their health, and will be asked to limit movement outside of the home to the extent possible. Follow the monitoring steps for close contacts listed on the symptom monitoring form.   ? If you have additional questions, contact your local health department or call the epidemiologist on call at [phone] (available 24/7). ? This guidance is subject to change. For the most up-to-date guidance from [facility], please refer to their website: TripMetro.huhttps://www.cdc.gov/coronavirus/2019-ncov/hcp/guidance-prevent-spread.html

## 2019-02-08 NOTE — ED Provider Notes (Signed)
[facility]-EMERGENCY DEPT Provider Note   CSN: [id number] Arrival date & time: 02/08/19  1139    History   Chief Complaint Chief Complaint  Patient presents with  . Hip Pain  . Leg Pain  . Fall    HPI [name] is a 58 y.o. female.     HPI   She presents for evaluation of bilateral leg heaviness, and syncope leading to a fall with injury of the right lower leg today.  She also complains of shortness of breath.  She has had a vague sensation of chest heaviness today.  Her husband was concerned that she had some slurred speech.  She is not currently smoking.  No known sick exposures.  He denies fever, chills, nausea, cough, persistent shortness of breath, paresthesia or focal weakness.  There are no other known modifying factors.  Past Medical History:  Diagnosis Date  . Anemia    hx of with pregnancy   . Anxiety   . Arthritis   . Asthma   . Colon polyps   . Depression   . GERD (gastroesophageal reflux disease)   . Headache    occasional headaches   . High cholesterol   . Hypertension   . PONV (postoperative nausea and vomiting)   . Shortness of breath dyspnea    due to weight gain   . Sleep apnea    has not use CPAP machine in 2 yrs/ DOES NOT KNOW IF SHE NEEDS TO USE C - PAP    Patient Active Problem List   Diagnosis Date Noted  . Morbid obesity with BMI of 50.0-59.9, adult (HCC) 01/19/2016  . History of total knee arthroplasty 12/23/2014  . Prediabetes 10/21/2014  . Transaminasemia 10/21/2014  . Infection of total left knee replacement (HCC) 05/23/2014  . Total knee replacement status 04/16/2014  . Aneurysmal bone cyst, 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  . Osteoarthritis of right knee 10/22/2013  . Complex tear of medial meniscus of right knee as current injury 10/22/2013  . Chronic insomnia 10/27/2012  . Asthma, mild intermittent 10/26/2012  . Osteoarthritis 10/26/2012  . Personal history of colonic polyps 10/26/2012  . GERD  (gastroesophageal reflux disease) 10/26/2012  . Essential hypertension, benign 10/26/2012  . Shortness of breath 10/02/2012  . Precordial pain 10/02/2012  . Depression   . High cholesterol   . Migraines     Past Surgical History:  Procedure Laterality Date  . BACK SURGERY  2012   lumb fusion  . CARPAL TUNNEL RELEASE  2007   both hands  . COLONOSCOPY    . ENDOMETRIAL ABLATION W/ NOVASURE    . EXCISION METACARPAL MASS Right 03/10/2014   Procedure: EXCISION MASS RIGHT HAND FIRST WEB SPACE DORSAL PALMAR INCISION ;  Surgeon: Cindee SaltGary Kuzma, MD;  Location: [facility];  Service: Orthopedics;  Laterality: Right;  . IRRIGATION AND DEBRIDEMENT KNEE Left 05/23/2014   Procedure: IRRIGATION AND DEBRIDEMENT KNEE;  Surgeon: Shelda PalMatthew D Olin, MD;  Location: [facility];  Service: Orthopedics;  Laterality: Left;  With POLYETHYLENE EXCHANGE  . KNEE ARTHROSCOPY     let knee 03/2013   . KNEE ARTHROSCOPY WITH MEDIAL MENISECTOMY Right 10/22/2013   Procedure: RIGHT KNEE ARTHROSCOPY WITH MEDIAL MENISECTOMY, abrasion chrondrplasty Brigid Re/CLEAN OUT/CORRECT CURETTEMENT/BONE GRAFT/PROXIMAL TIBIA ;  Surgeon: Jacki Conesonald A Gioffre, MD;  Location: [facility];  Service: Orthopedics;  Laterality: Right;  . TOTAL KNEE ARTHROPLASTY Left 04/16/2014   Procedure: LEFT TOTAL KNEE ARTHROPLASTY;  Surgeon: Jacki Conesonald A Gioffre, MD;  [facility];  Service: Orthopedics;  Laterality: Left;  . TOTAL KNEE ARTHROPLASTY Right 12/23/2014   Procedure: TOTAL KNEE ARTHROPLASTY;  Surgeon: Ranee Gosselinonald Gioffre, MD;  Location: [facility];  Service: Orthopedics;  Laterality: Right;  . trigger finger release surgeyr      bilateral   . TUBAL LIGATION       OB History   No obstetric history on file.      Home Medications    Prior to Admission medications   Medication Sig Start Date End Date Taking? Authorizing Provider  albuterol (PROVENTIL HFA;VENTOLIN HFA) 108 (90 Base) MCG/ACT inhaler Inhale 2 puffs into the lungs every 6 (six) hours as needed for  wheezing or shortness of breath. 10/29/15  Yes Elenora GammaBradshaw, Samuel L, MD  amLODipine (NORVASC) 10 MG tablet TAKE 1 TABLET (10 MG TOTAL) BY MOUTH DAILY. Patient taking differently: Take 10 mg by mouth daily.  07/06/15  Yes Burchette, Elberta FortisBruce W, MD  cloNIDine (CATAPRES) 0.1 MG tablet TAKE 2 TABLETS EVERY MORNING AND 3 TABLETS EVERY EVENING Patient taking differently: Take 0.3 mg by mouth daily.  01/28/19  Yes Cottle, Steva Readyarey G Jr., MD  DUEXIS 800-26.6 MG TABS Take 1 tablet by mouth 3 (three) times daily. 11/30/18  Yes [provider]  fluticasone furoate-vilanterol (BREO ELLIPTA) 200-25 MCG/INH AEPB Inhale 1 puff into the lungs daily. Patient taking differently: Inhale 1 puff into the lungs at bedtime.  10/29/15  Yes Elenora GammaBradshaw, Samuel L, MD  furosemide (LASIX) 20 MG tablet Take 20 mg by mouth daily. 12/15/18  Yes [provider]  lisinopril (PRINIVIL,ZESTRIL) 20 MG tablet TAKE 1 TABLET BY MOUTH EVERY DAY Patient taking differently: Take 20 mg by mouth daily.  05/20/15  Yes Burchette, Elberta FortisBruce W, MD  lithium 300 MG tablet Take 3 tablets (900 mg total) by mouth at bedtime. 10/19/18  Yes Shugart, Mat Carnelay, PA-C  mirtazapine (REMERON) 30 MG tablet Take 1 tablet (30 mg total) by mouth at bedtime. 12/27/18  Yes Cottle, Steva Readyarey G Jr., MD  montelukast (SINGULAIR) 10 MG tablet TAKE 1 TABLET (10 MG TOTAL) BY MOUTH AT BEDTIME. Patient taking differently: Take 10 mg by mouth at bedtime.  05/27/16  Yes Burchette, Elberta FortisBruce W, MD  pravastatin (PRAVACHOL) 40 MG tablet Take 1 tablet (40 mg total) by mouth at bedtime. 12/15/15  Yes Burchette, Elberta FortisBruce W, MD  promethazine (PHENERGAN) 25 MG tablet TAKE 1 TABLET EVERY 6 HOURS AS NEEDED FOR NAUSEA Patient taking differently: Take 25 mg by mouth every 6 (six) hours as needed for nausea.  03/25/16  Yes Hawks, Christy A, FNP  rizatriptan (MAXALT) 10 MG tablet Take 10 mg by mouth as needed for migraine (may repeat in 2 hours if needed.).  10/09/18  Yes [provider]  sertraline  (ZOLOFT) 100 MG tablet Take 1 tablet (100 mg total) by mouth 2 (two) times daily. Patient taking differently: Take 100 mg by mouth at bedtime.  09/25/18  Yes Shugart, Mat Carnelay, PA-C  zolpidem (AMBIEN CR) 12.5 MG CR tablet Take 1 tablet (12.5 mg total) by mouth at bedtime as needed for sleep. 12/03/18  Yes Cottle, Steva Readyarey G Jr., MD    Family History Family History  Problem Relation Age of Onset  . Alcohol abuse Mother   . Arthritis Mother   . Heart disease Mother   . Hyperlipidemia Father   . Hypertension Father   . Heart disease Father [id number]       CABG  . Gout Father   . Hypertension Sister   . Cancer Brother  colon, lung, liver  . Cancer Maternal Grandmother        breast  . Cancer Daughter        NEUROBLASTOMA    Social History Social History   Tobacco Use  . Smoking status: Never Smoker  . Smokeless tobacco: Never Used  Substance Use Topics  . Alcohol use: No  . Drug use: No     Allergies   Adhesive [tape], Ceclor [cefaclor], Chocolate, and Venlafaxine   Review of Systems Review of Systems  All other systems reviewed and are negative.    Physical Exam Updated Vital Signs BP (!) 181/105   Pulse 96   Temp 98.3 F (36.8 C) (Oral)   Resp 17   SpO2 96%   Physical Exam Vitals signs and nursing note reviewed.  Constitutional:      General: She is not in acute distress.    Appearance: She is well-developed. She is obese. She is not ill-appearing, toxic-appearing or diaphoretic.  HENT:     Head: Normocephalic and atraumatic.     Right Ear: External ear normal.     Left Ear: External ear normal.     Mouth/Throat:     Mouth: Mucous membranes are moist.  Eyes:     Conjunctiva/sclera: Conjunctivae normal.     Pupils: Pupils are equal, round, and reactive to light.  Neck:     Musculoskeletal: Normal range of motion and neck supple.     Trachea: Phonation normal.  Cardiovascular:     Rate and Rhythm: Normal rate and regular rhythm.     Heart sounds: Normal  heart sounds.  Pulmonary:     Effort: Pulmonary effort is normal. No respiratory distress.     Breath sounds: Normal breath sounds. No stridor. No wheezing.  Abdominal:     General: There is no distension.     Palpations: Abdomen is soft.     Tenderness: There is no abdominal tenderness. There is no guarding.  Musculoskeletal: Normal range of motion.     Right lower leg: No edema.     Left lower leg: No edema.     Comments: Normal strength, arms and legs bilaterally.  Skin:    General: Skin is warm and dry.  Neurological:     Mental Status: She is alert and oriented to person, place, and time.     Cranial Nerves: No cranial nerve deficit.     Sensory: No sensory deficit.     Motor: No abnormal muscle tone.     Coordination: Coordination normal.     Comments: No dysarthria, aphasia or nystagmus.  Psychiatric:        Mood and Affect: Mood normal.        Behavior: Behavior normal.        Thought Content: Thought content normal.        Judgment: Judgment normal.      ED Treatments / Results  Labs (all labs ordered are listed, but only abnormal results are displayed) Labs Reviewed  SARS CORONAVIRUS 2 (HOSPITAL ORDER, PERFORMED IN [facility])  BLOOD GAS, VENOUS  BASIC METABOLIC PANEL  CBC WITH DIFFERENTIAL/PLATELET  D-DIMER, QUANTITATIVE ([facility])    EKG None  Radiology Dg Chest 2 View  Result Date: 02/08/2019 CLINICAL DATA:  Pt c/o left hip pain that radiates down left leg since yesterday making her left foot felt heavy and hard to pick up all day yesterday causing her to fall today. Her husband feels patients speech is slurred  and patient takes to much Ambien. Pt reported she has chronic SOB due to asthma and bronchitis EXAM: CHEST - 2 VIEW COMPARISON:  05/13/2015 FINDINGS: The heart size and mediastinal contours are within normal limits. Both lungs are clear. No pleural effusion or pneumothorax. The visualized skeletal structures are unremarkable.  IMPRESSION: No active cardiopulmonary disease. Electronically Signed   By: Lajean Manes M.D.   On: 02/08/2019 14:03   Ct Head Wo Contrast  Result Date: 02/08/2019 CLINICAL DATA:  58 year old presenting with slurred speech. EXAM: CT HEAD WITHOUT CONTRAST TECHNIQUE: Contiguous axial images were obtained from the base of the skull through the vertex without intravenous contrast. COMPARISON:  02/23/2012. FINDINGS: Brain: Ventricular system normal in size and appearance for age. No mass lesion. No midline shift. No acute hemorrhage or hematoma. No extra-axial fluid collections. No evidence of acute infarction. No focal brain parenchymal abnormalities. No interval change. Vascular: No hyperdense vessel. No visible atherosclerosis. Skull: Mild changes of hyperostosis frontalis interna. No skull fracture or other focal osseous abnormality involving the skull. Sinuses/Orbits: Visualized paranasal sinuses, bilateral mastoid air cells and bilateral middle ear cavities well-aerated. Visualized orbits and globes normal in appearance. Other: None. IMPRESSION: Normal examination. Electronically Signed   By: Evangeline Dakin M.D.   On: 02/08/2019 13:54    Procedures Procedures (including critical care time)  Medications Ordered in ED Medications - No data to display   Initial Impression / Assessment and Plan / ED Course  I have reviewed the triage vital signs and the nursing notes.  Pertinent labs & imaging results that were available during my care of the patient were reviewed by me and considered in my medical decision making (see chart for details).         Patient Vitals for the past 24 hrs:  BP Temp Temp src Pulse Resp SpO2  02/08/19 1400 (!) 181/105 - - 96 17 96 %  02/08/19 1149 (!) 172/103 98.3 F (36.8 C) Oral 99 18 95 %    Screening evaluation ordered.  Medical Decision Making: Nonspecific symptoms, with syncope.  Reported dysarthria, however patient does not have dysphasia or dysarthria  during my examination.  There is no apparent focal neurologic abnormality.  Screening evaluation ordered.  Concern for occult metabolic or infectious process.  Possible respiratory disorder and CVA however doubt.  She will require disposition after appropriate testing with consideration for discharge or admission.  CRITICAL CARE-no Performed by: Daleen Bo  Nursing Notes Reviewed/ Care Coordinated Applicable Imaging Reviewed Interpretation of Laboratory Data incorporated into ED treatment  Plan-as per Dr. Darl Householder, after testing and reevaluation.  Final Clinical Impressions(s) / ED Diagnoses   Final diagnoses:  Syncope, unspecified syncope type  Weakness    ED Discharge Orders    None       Daleen Bo, MD 02/08/19 [id number]

## 2019-02-08 NOTE — ED Provider Notes (Signed)
  Physical Exam  BP (!) 173/103   Pulse (!) 102   Temp 98.3 F (36.8 C) (Oral)   Resp (!) 21   SpO2 95%   Physical Exam  ED Course/Procedures     Procedures  MDM  Care assumed at 4 pm from Dr. Eulis Foster.  Patient had a near syncopal event and had some shortness of breath.  D-dimer was sent and was pending at signout.  Patient apparently also had an episode where she had some trouble speaking but is completely back to baseline and neurologically intact per previous provider note. Sign out pending d-dimer.   7:01 PM D-dimer elevated but CTA chest did not show any PE.  There is incidental lung nodule.  Her troponin is negative.  Her CT head was unremarkable and she remains neurologically intact and has no obvious neuro deficits.  COVID is pending.  I told her to self quarantine at home.  Patient stable for discharge and doesn't need any more emergent workup in the ED.   [name] was evaluated in Emergency Department on 02/08/2019 for the symptoms described in the history of present illness. She was evaluated in the context of the global COVID-19 pandemic, which necessitated consideration that the patient might be at risk for infection with the SARS-CoV-2 virus that causes COVID-19. Institutional protocols and algorithms that pertain to the evaluation of patients at risk for COVID-19 are in a state of rapid change based on information released by regulatory bodies including the CDC and federal and state organizations. These policies and algorithms were followed during the patient's care in the ED.     Morgan Freeze, MD 02/08/19 Lurline Hare

## 2019-02-08 NOTE — ED Notes (Signed)
EDP at bedside  

## 2019-02-08 NOTE — ED Notes (Signed)
Respiratory called for VBG in mini lab.

## 2019-02-08 NOTE — ED Notes (Signed)
Patient transported to X-ray 

## 2019-02-21 ENCOUNTER — Other Ambulatory Visit: Payer: Self-pay | Admitting: Psychiatry

## 2019-02-21 DIAGNOSIS — F331 Major depressive disorder, recurrent, moderate: Secondary | ICD-10-CM

## 2019-02-27 ENCOUNTER — Other Ambulatory Visit: Payer: Self-pay | Admitting: Family Medicine

## 2019-04-02 ENCOUNTER — Other Ambulatory Visit: Payer: Self-pay

## 2019-04-02 ENCOUNTER — Encounter: Payer: Self-pay | Admitting: Family Medicine

## 2019-04-02 ENCOUNTER — Ambulatory Visit (INDEPENDENT_AMBULATORY_CARE_PROVIDER_SITE_OTHER): Payer: BC Managed Care – PPO | Admitting: Family Medicine

## 2019-04-02 VITALS — BP 163/99 | HR 81 | Temp 98.0°F | Resp 16 | Ht 64.0 in | Wt 331.8 lb

## 2019-04-02 DIAGNOSIS — J4541 Moderate persistent asthma with (acute) exacerbation: Secondary | ICD-10-CM

## 2019-04-02 DIAGNOSIS — I10 Essential (primary) hypertension: Secondary | ICD-10-CM

## 2019-04-02 DIAGNOSIS — R7303 Prediabetes: Secondary | ICD-10-CM

## 2019-04-02 DIAGNOSIS — Z7689 Persons encountering health services in other specified circumstances: Secondary | ICD-10-CM

## 2019-04-02 DIAGNOSIS — F3177 Bipolar disorder, in partial remission, most recent episode mixed: Secondary | ICD-10-CM

## 2019-04-02 DIAGNOSIS — M5416 Radiculopathy, lumbar region: Secondary | ICD-10-CM

## 2019-04-02 DIAGNOSIS — M2390 Unspecified internal derangement of unspecified knee: Secondary | ICD-10-CM

## 2019-04-02 DIAGNOSIS — Z23 Encounter for immunization: Secondary | ICD-10-CM | POA: Diagnosis not present

## 2019-04-02 LAB — BAYER DCA HB A1C WAIVED: HB A1C (BAYER DCA - WAIVED): 6 % (ref <7.0)

## 2019-04-02 MED ORDER — PROMETHAZINE HCL 25 MG PO TABS: 25.0000 mg | ORAL_TABLET | Freq: Four times a day (QID) | ORAL | 1 refills | Status: DC | PRN

## 2019-04-02 MED ORDER — PREGABALIN 50 MG PO CAPS: ORAL_CAPSULE | ORAL | 1 refills | Status: DC

## 2019-04-02 MED ORDER — FUROSEMIDE 20 MG PO TABS: 20.0000 mg | ORAL_TABLET | Freq: Every day | ORAL | 1 refills | Status: DC

## 2019-04-02 MED ORDER — BETAMETHASONE SOD PHOS & ACET 6 (3-3) MG/ML IJ SUSP
6.0000 mg | Freq: Once | INTRAMUSCULAR | Status: AC
  Administered 2019-04-02: 6 mg via INTRAMUSCULAR

## 2019-04-02 MED ORDER — LISINOPRIL 20 MG PO TABS: 20.0000 mg | ORAL_TABLET | Freq: Every day | ORAL | 1 refills | Status: DC

## 2019-04-02 MED ORDER — AMLODIPINE BESYLATE 10 MG PO TABS: 10.0000 mg | ORAL_TABLET | Freq: Every day | ORAL | 1 refills | Status: DC

## 2019-04-02 MED ORDER — PRAVASTATIN SODIUM 40 MG PO TABS: 40.0000 mg | ORAL_TABLET | Freq: Every day | ORAL | 1 refills | Status: DC

## 2019-04-03 ENCOUNTER — Encounter: Payer: Self-pay | Admitting: Family Medicine

## 2019-04-03 ENCOUNTER — Other Ambulatory Visit: Payer: Self-pay | Admitting: Family Medicine

## 2019-04-03 DIAGNOSIS — J4541 Moderate persistent asthma with (acute) exacerbation: Secondary | ICD-10-CM | POA: Insufficient documentation

## 2019-04-03 DIAGNOSIS — F3177 Bipolar disorder, in partial remission, most recent episode mixed: Secondary | ICD-10-CM | POA: Insufficient documentation

## 2019-04-03 DIAGNOSIS — M5416 Radiculopathy, lumbar region: Secondary | ICD-10-CM | POA: Insufficient documentation

## 2019-04-03 DIAGNOSIS — M2390 Unspecified internal derangement of unspecified knee: Secondary | ICD-10-CM | POA: Insufficient documentation

## 2019-04-03 LAB — CMP14+EGFR
ALT: 39 IU/L — ABNORMAL HIGH (ref 0–32)
AST: 35 IU/L (ref 0–40)
Albumin/Globulin Ratio: 1.8 (ref 1.2–2.2)
Albumin: 4.7 g/dL (ref 3.8–4.9)
Alkaline Phosphatase: 112 IU/L (ref 39–117)
BUN/Creatinine Ratio: 16 (ref 9–23)
BUN: 17 mg/dL (ref 6–24)
Bilirubin Total: 0.3 mg/dL (ref 0.0–1.2)
CO2: 22 mmol/L (ref 20–29)
Calcium: 9.4 mg/dL (ref 8.7–10.2)
Chloride: 99 mmol/L (ref 96–106)
Creatinine, Ser: 1.06 mg/dL — ABNORMAL HIGH (ref 0.57–1.00)
GFR calc Af Amer: 68 mL/min/{1.73_m2} (ref >59)
GFR calc non Af Amer: 59 mL/min/{1.73_m2} — ABNORMAL LOW (ref >59)
Globulin, Total: 2.6 g/dL (ref 1.5–4.5)
Glucose: 102 mg/dL — ABNORMAL HIGH (ref 65–99)
Potassium: 4.1 mmol/L (ref 3.5–5.2)
Sodium: 137 mmol/L (ref 134–144)
Total Protein: 7.3 g/dL (ref 6.0–8.5)

## 2019-04-03 LAB — CBC WITH DIFFERENTIAL/PLATELET
Basophils Absolute: 0.1 10*3/uL (ref 0.0–0.2)
Basos: 1 %
EOS (ABSOLUTE): 0.4 10*3/uL (ref 0.0–0.4)
Eos: 4 %
Hematocrit: 38.9 % (ref 34.0–46.6)
Hemoglobin: 13.1 g/dL (ref 11.1–15.9)
Immature Grans (Abs): 0 10*3/uL (ref 0.0–0.1)
Immature Granulocytes: 0 %
Lymphocytes Absolute: 1.8 10*3/uL (ref 0.7–3.1)
Lymphs: 18 %
MCH: 29.2 pg (ref 26.6–33.0)
MCHC: 33.7 g/dL (ref 31.5–35.7)
MCV: 87 fL (ref 79–97)
Monocytes Absolute: 0.5 10*3/uL (ref 0.1–0.9)
Monocytes: 6 %
Neutrophils Absolute: 6.8 10*3/uL (ref 1.4–7.0)
Neutrophils: 71 %
Platelets: 250 10*3/uL (ref 150–450)
RBC: 4.49 x10E6/uL (ref 3.77–5.28)
RDW: 14.5 % (ref 11.7–15.4)
WBC: 9.5 10*3/uL (ref 3.4–10.8)

## 2019-04-03 LAB — LIPID PANEL
Chol/HDL Ratio: 9.9 ratio — ABNORMAL HIGH (ref 0.0–4.4)
Cholesterol, Total: 327 mg/dL — ABNORMAL HIGH (ref 100–199)
HDL: 33 mg/dL — ABNORMAL LOW (ref >39)
LDL Chol Calc (NIH): 218 mg/dL — ABNORMAL HIGH (ref 0–99)
Triglycerides: 361 mg/dL — ABNORMAL HIGH (ref 0–149)
VLDL Cholesterol Cal: 76 mg/dL — ABNORMAL HIGH (ref 5–40)

## 2019-04-03 LAB — TSH: TSH: 6.03 u[IU]/mL — ABNORMAL HIGH (ref 0.450–4.500)

## 2019-04-03 NOTE — Progress Notes (Signed)
Subjective:  Patient ID: [name], female    DOB: 1960-12-01  Age: 58 y.o. MRN: [id number]  CC: New Patient (Initial Visit) ([place] ) and Establish Care   HPI [name] presents for new patient visit. Needs to establish with [facility] due to retirement of Dr. Edrick Oh. She is also wheezing and short of breath. She has a history of asthma. It flares when the weather changes.  She denies smoking ever. She has had no chest pain or pedal edema. No orthopnea or paroxysmal nocturnal dyspnea. Current dyspnea on walking 50 feet. Uses inhalers and for exacerbation, steroid bolus.   PT. Has a long term psychiatric history of bipolar disease. She sees Dr. Lynder Parents, a psychiatrist at [facility]. She reports being stable, but is in transition as her previous psychiatrist retired recently. She has a complicated regimen of medication, including lithium, managed by him. She also has chronic back pain. She had herniated disc surgery with Dr. Annette Stable. Additionally she has had 6 surgeries on her knees. She attends a pain clinic.She still has significant pain.    Depression screen [facility] 2/9 04/02/2019 01/19/2016 10/29/2015  Decreased Interest 3 1 0  Down, Depressed, Hopeless 0 0 0  PHQ - 2 Score 3 1 0  Altered sleeping 0 - -  Tired, decreased energy 1 - -  Change in appetite 3 - -  Feeling bad or failure about yourself  3 - -  Trouble concentrating 1 - -  Moving slowly or fidgety/restless 0 - -  Suicidal thoughts 0 - -  PHQ-9 Score 11 - -  Difficult doing work/chores Somewhat difficult - -    History Natali has a past medical history of Anemia, Anxiety, Arthritis, Asthma, Colon polyps, Depression, GERD (gastroesophageal reflux disease), Headache, High cholesterol, Hypertension, PONV (postoperative nausea and vomiting), Shortness of breath dyspnea, and Sleep apnea.   She has a past surgical history that includes Endometrial ablation w/ novasure; Carpal tunnel release (2007); trigger finger  release surgeyr ; Tubal ligation; Knee arthroscopy; Knee arthroscopy with medial menisectomy (Right, 10/22/2013); Back surgery (2012); Colonoscopy; Excision metacarpal mass (Right, 03/10/2014); Total knee arthroplasty (Left, 04/16/2014); Irrigation and debridement knee (Left, 05/23/2014); and Total knee arthroplasty (Right, 12/23/2014).   Her family history includes Alcohol abuse in her mother; Arthritis in her mother; Cancer in her brother, daughter, and maternal grandmother; Gout in her father; Heart disease in her mother; Heart disease (age of onset: 30) in her father; Hyperlipidemia in her father; Hypertension in her father and sister.She reports that she has never smoked. She has never used smokeless tobacco. She reports that she does not drink alcohol or use drugs.    ROS Review of Systems  Constitutional: Negative.   HENT: Negative for congestion.   Eyes: Negative for visual disturbance.  Respiratory: Negative for shortness of breath.   Cardiovascular: Negative for chest pain.  Gastrointestinal: Negative for abdominal pain, constipation, diarrhea, nausea and vomiting.  Genitourinary: Negative for difficulty urinating.  Musculoskeletal: Positive for arthralgias and back pain. Negative for myalgias.  Neurological: Negative for headaches.  Psychiatric/Behavioral: Negative for sleep disturbance.    Objective:  BP (!) 163/99    Pulse 81    Temp 98 F (36.7 C) (Temporal)    Resp 16    Ht '5\' 4"'$  (1.626 m)    Wt (!) 331 lb 12.8 oz (150.5 kg)    SpO2 93%    BMI 56.95 kg/m   BP Readings from Last 3 Encounters:  04/02/19 Marland Kitchen)  163/99  02/08/19 (!) 173/103  01/19/16 130/78    Wt Readings from Last 3 Encounters:  04/02/19 (!) 331 lb 12.8 oz (150.5 kg)  01/19/16 (!) 335 lb (152 kg)  10/29/15 (!) 340 lb 6.4 oz (154.4 kg)     Physical Exam Constitutional:      General: She is not in acute distress.    Appearance: She is well-developed. She is obese.  HENT:     Head: Normocephalic and  atraumatic.     Right Ear: External ear normal.     Left Ear: External ear normal.     Nose: Nose normal.  Eyes:     Conjunctiva/sclera: Conjunctivae normal.     Pupils: Pupils are equal, round, and reactive to light.  Neck:     Musculoskeletal: Normal range of motion and neck supple.     Thyroid: No thyromegaly.  Cardiovascular:     Rate and Rhythm: Normal rate and regular rhythm.     Heart sounds: Normal heart sounds. No murmur.  Pulmonary:     Effort: Pulmonary effort is normal. No respiratory distress.     Breath sounds: Normal breath sounds. No wheezing or rales.  Abdominal:     General: Bowel sounds are normal. There is no distension.     Palpations: Abdomen is soft.     Tenderness: There is no abdominal tenderness.  Musculoskeletal: Normal range of motion.        General: Deformity (multiple surgical scars at knees and back. ) present.  Lymphadenopathy:     Cervical: No cervical adenopathy.  Skin:    General: Skin is warm and dry.  Neurological:     Mental Status: She is alert and oriented to person, place, and time.     Deep Tendon Reflexes: Reflexes are normal and symmetric.  Psychiatric:        Behavior: Behavior normal.        Thought Content: Thought content normal.        Judgment: Judgment normal.       Assessment & Plan:   Sadeen was seen today for new patient (initial visit) and establish care.  Diagnoses and all orders for this visit:  Moderate persistent asthma with acute exacerbation  Need for immunization against influenza -     Flu Vaccine QUAD 36+ mos IM  Essential hypertension, benign -     CBC with Differential/Platelet -     CMP14+EGFR -     Lipid panel -     TSH -     betamethasone acetate-betamethasone sodium phosphate (CELESTONE) injection 6 mg  Borderline diabetes -     Bayer DCA Hb A1c Waived  Bipolar disorder, in partial remission, most recent episode mixed (HCC)  Lumbar radiculopathy  Internal derangement of multiple sites  of knee, unspecified laterality  Establishing care with new doctor, encounter for  Other orders -     amLODipine (NORVASC) 10 MG tablet; Take 1 tablet (10 mg total) by mouth daily. -     furosemide (LASIX) 20 MG tablet; Take 1 tablet (20 mg total) by mouth daily. -     lisinopril (ZESTRIL) 20 MG tablet; Take 1 tablet (20 mg total) by mouth daily. -     pravastatin (PRAVACHOL) 40 MG tablet; Take 1 tablet (40 mg total) by mouth at bedtime. -     promethazine (PHENERGAN) 25 MG tablet; Take 1 tablet (25 mg total) by mouth every 6 (six) hours as needed for nausea. -  pregabalin (LYRICA) 50 MG capsule; 1 qhs X7 days , then 2 qhs X 7d, then 3 qhs X 7d, then 4 qhs       I have changed Levester Fresh. Reller's furosemide, lisinopril, and promethazine. I am also having her start on pregabalin. Additionally, I am having her maintain her fluticasone furoate-vilanterol, albuterol, sertraline, lithium, Duexis, rizatriptan, zolpidem, cloNIDine, mirtazapine, amLODipine, and pravastatin. We administered betamethasone acetate-betamethasone sodium phosphate.  Allergies as of 04/02/2019      Reactions   Adhesive [tape] Other (See Comments)   Peeled off skin   Ceclor [cefaclor] Other (See Comments)   Blisters on hands   Chocolate Other (See Comments)   migraines   Venlafaxine Other (See Comments)   "refuses to take - makes me feel like I am dying"      Medication List       Accurate as of April 02, 2019 11:59 PM. If you have any questions, ask your nurse or doctor.        albuterol 108 (90 Base) MCG/ACT inhaler Commonly known as: VENTOLIN HFA Inhale 2 puffs into the lungs every 6 (six) hours as needed for wheezing or shortness of breath.   amLODipine 10 MG tablet Commonly known as: NORVASC Take 1 tablet (10 mg total) by mouth daily. What changed: See the new instructions.   cloNIDine 0.1 MG tablet Commonly known as: CATAPRES TAKE 2 TABLETS EVERY MORNING AND 3 TABLETS EVERY EVENING What  changed:   how much to take  how to take this  when to take this  additional instructions   Duexis 800-26.6 MG Tabs Generic drug: Ibuprofen-Famotidine Take 1 tablet by mouth 3 (three) times daily.   fluticasone furoate-vilanterol 200-25 MCG/INH Aepb Commonly known as: Breo Ellipta Inhale 1 puff into the lungs daily. What changed: when to take this   furosemide 20 MG tablet Commonly known as: LASIX Take 1 tablet (20 mg total) by mouth daily.   lisinopril 20 MG tablet Commonly known as: ZESTRIL Take 1 tablet (20 mg total) by mouth daily.   lithium 300 MG tablet Take 3 tablets (900 mg total) by mouth at bedtime.   mirtazapine 30 MG tablet Commonly known as: REMERON TAKE 1 TABLET (30 MG TOTAL) BY MOUTH AT BEDTIME.   montelukast 10 MG tablet Commonly known as: SINGULAIR TAKE 1 TABLET (10 MG TOTAL) BY MOUTH AT BEDTIME. What changed: See the new instructions.   pravastatin 40 MG tablet Commonly known as: PRAVACHOL Take 1 tablet (40 mg total) by mouth at bedtime.   pregabalin 50 MG capsule Commonly known as: Lyrica 1 qhs X7 days , then 2 qhs X 7d, then 3 qhs X 7d, then 4 qhs Started by: [name], MD   promethazine 25 MG tablet Commonly known as: PHENERGAN Take 1 tablet (25 mg total) by mouth every 6 (six) hours as needed for nausea. What changed: See the new instructions. Changed by: [name], MD   rizatriptan 10 MG tablet Commonly known as: MAXALT Take 10 mg by mouth as needed for migraine (may repeat in 2 hours if needed.).   sertraline 100 MG tablet Commonly known as: ZOLOFT Take 1 tablet (100 mg total) by mouth 2 (two) times daily. What changed: when to take this   zolpidem 12.5 MG CR tablet Commonly known as: AMBIEN CR Take 1 tablet (12.5 mg total) by mouth at bedtime as needed for sleep.        Follow-up: Return in about 1 month (around 05/02/2019).  Cletus Gash  Adeeb Konecny, M.D.

## 2019-04-08 ENCOUNTER — Other Ambulatory Visit: Payer: Self-pay | Admitting: Family Medicine

## 2019-04-08 ENCOUNTER — Telehealth: Payer: Self-pay | Admitting: Family Medicine

## 2019-04-08 MED ORDER — LEVOTHYROXINE SODIUM 50 MCG PO TABS: 50.0000 ug | ORAL_TABLET | Freq: Every day | ORAL | 0 refills | Status: DC

## 2019-04-17 ENCOUNTER — Other Ambulatory Visit: Payer: Self-pay

## 2019-04-18 ENCOUNTER — Ambulatory Visit (INDEPENDENT_AMBULATORY_CARE_PROVIDER_SITE_OTHER): Payer: BC Managed Care – PPO | Admitting: Nurse Practitioner

## 2019-04-18 ENCOUNTER — Encounter: Payer: Self-pay | Admitting: Nurse Practitioner

## 2019-04-18 VITALS — BP 102/49 | HR 78 | Temp 97.4°F | Ht 64.0 in | Wt 333.0 lb

## 2019-04-18 DIAGNOSIS — M545 Low back pain, unspecified: Secondary | ICD-10-CM

## 2019-04-18 MED ORDER — PREDNISONE 10 MG (21) PO TBPK: ORAL_TABLET | ORAL | 0 refills | Status: DC

## 2019-04-18 MED ORDER — CYCLOBENZAPRINE HCL 10 MG PO TABS: 10.0000 mg | ORAL_TABLET | Freq: Three times a day (TID) | ORAL | 1 refills | Status: DC | PRN

## 2019-04-18 NOTE — Patient Instructions (Signed)
Acute Back Pain, Adult Acute back pain is sudden and usually short-lived. It is often caused by an injury to the muscles and tissues in the back. The injury may result from:  A muscle or ligament getting overstretched or torn (strained). Ligaments are tissues that connect bones to each other. Lifting something improperly can cause a back strain.  Wear and tear (degeneration) of the spinal disks. Spinal disks are circular tissue that provides cushioning between the bones of the spine (vertebrae).  Twisting motions, such as while playing sports or doing yard work.  A hit to the back.  Arthritis. You may have a physical exam, lab tests, and imaging tests to find the cause of your pain. Acute back pain usually goes away with rest and home care. Follow these instructions at home: Managing pain, stiffness, and swelling  Take over-the-counter and prescription medicines only as told by your health care provider.  Your health care provider may recommend applying ice during the first 24-48 hours after your pain starts. To do this: ? Put ice in a plastic bag. ? Place a towel between your skin and the bag. ? Leave the ice on for 20 minutes, 2-3 times a day.  If directed, apply heat to the affected area as often as told by your health care provider. Use the heat source that your health care provider recommends, such as a moist heat pack or a heating pad. ? Place a towel between your skin and the heat source. ? Leave the heat on for 20-30 minutes. ? Remove the heat if your skin turns bright red. This is especially important if you are unable to feel pain, heat, or cold. You have a greater risk of getting burned. Activity   Do not stay in bed. Staying in bed for more than 1-2 days can delay your recovery.  Sit up and stand up straight. Avoid leaning forward when you sit, or hunching over when you stand. ? If you work at a desk, sit close to it so you do not need to lean over. Keep your chin tucked  in. Keep your neck drawn back, and keep your elbows bent at a right angle. Your arms should look like the letter "L." ? Sit high and close to the steering wheel when you drive. Add lower back (lumbar) support to your car seat, if needed.  Take short walks on even surfaces as soon as you are able. Try to increase the length of time you walk each day.  Do not sit, drive, or stand in one place for more than 30 minutes at a time. Sitting or standing for long periods of time can put stress on your back.  Do not drive or use heavy machinery while taking prescription pain medicine.  Use proper lifting techniques. When you bend and lift, use positions that put less stress on your back: ? Bend your knees. ? Keep the load close to your body. ? Avoid twisting.  Exercise regularly as told by your health care provider. Exercising helps your back heal faster and helps prevent back injuries by keeping muscles strong and flexible.  Work with a physical therapist to make a safe exercise program, as recommended by your health care provider. Do any exercises as told by your physical therapist. Lifestyle  Maintain a healthy weight. Extra weight puts stress on your back and makes it difficult to have good posture.  Avoid activities or situations that make you feel anxious or stressed. Stress and anxiety increase muscle   tension and can make back pain worse. Learn ways to manage anxiety and stress, such as through exercise. General instructions  Sleep on a firm mattress in a comfortable position. Try lying on your side with your knees slightly bent. If you lie on your back, put a pillow under your knees.  Follow your treatment plan as told by your health care provider. This may include: ? Cognitive or behavioral therapy. ? Acupuncture or massage therapy. ? Meditation or yoga. Contact a health care provider if:  You have pain that is not relieved with rest or medicine.  You have increasing pain going down  into your legs or buttocks.  Your pain does not improve after 2 weeks.  You have pain at night.  You lose weight without trying.  You have a fever or chills. Get help right away if:  You develop new bowel or bladder control problems.  You have unusual weakness or numbness in your arms or legs.  You develop nausea or vomiting.  You develop abdominal pain.  You feel faint. Summary  Acute back pain is sudden and usually short-lived.  Use proper lifting techniques. When you bend and lift, use positions that put less stress on your back.  Take over-the-counter and prescription medicines and apply heat or ice as directed by your health care provider. This information is not intended to replace advice given to you by your health care provider. Make sure you discuss any questions you have with your health care provider. Document Released: 07/11/2005 Document Revised: 10/30/2018 Document Reviewed: 02/22/2017 Elsevier Patient Education  2020 [employer] Inc[employer]  

## 2019-04-18 NOTE — Progress Notes (Signed)
   Subjective:    Patient ID: [name], female    DOB: 04/29/1961, 58 y.o.   MRN: [id number]   Chief Complaint: Back Pain   HPI patient come sin c/o of low back pain. It started 2 months ago but did not start getting intense until the last.week. describes pain as a spasm type pain and standing still will usually help it to ease up. Rates the pain 8/10 currently. Going from sitting to standing or doing any activity. [name] has had back surgery in 2012 and has hardware in her back already. This is the first bad flare up since her surgery.    Review of Systems  Constitutional: Negative for activity change and appetite change.  HENT: Negative.   Eyes: Negative for pain.  Respiratory: Negative for shortness of breath.   Cardiovascular: Negative for chest pain, palpitations and leg swelling.  Gastrointestinal: Negative for abdominal pain.  Endocrine: Negative for polydipsia.  Genitourinary: Negative.   Musculoskeletal: Positive for back pain.  Skin: Negative for rash.  Neurological: Negative for dizziness, weakness and headaches.  Hematological: Does not bruise/bleed easily.  Psychiatric/Behavioral: Negative.   All other systems reviewed and are negative.      Objective:   Physical Exam Vitals signs and nursing note reviewed.  Constitutional:      Appearance: Normal appearance.  Cardiovascular:     Rate and Rhythm: Normal rate and regular rhythm.     Pulses: Normal pulses.     Heart sounds: Normal heart sounds.  Pulmonary:     Effort: Pulmonary effort is normal.     Breath sounds: Normal breath sounds.  Musculoskeletal:     Comments: Decrease ROM of lumbar spine due to pain with any movement Rises slowly from sitting to standing (+) SLR on right Motor strength and sensation distally intact  Neurological:     General: No focal deficit present.     Mental Status: [name] is alert and oriented to person, place, and time.  Psychiatric:        Mood and Affect: Mood normal.         Behavior: Behavior normal.    BP (!) 102/49   Pulse 78   Temp (!) 97.4 F (36.3 C) (Temporal)   Ht 5\' 4"  (1.626 m)   Wt (!) 333 lb (151 kg)   BMI 57.16 kg/m         Assessment & Plan:  Guss Bunde in today with chief complaint of Back Pain   1.  Acute midline low back pain without sciatica Moist heat Rest No bending or stooping rto ifno better in 1week - cyclobenzaprine (FLEXERIL) 10 MG tablet; Take 1 tablet (10 mg total) by mouth 3 (three) times daily as needed for muscle spasms.  Dispense: 30 tablet; Refill: 1 - predniSONE (STERAPRED UNI-PAK 21 TAB) 10 MG (21) TBPK tablet; As directed x 6 days  Dispense: 21 tablet; Refill: 0  Morgan Hassell Done, FNP

## 2019-04-22 ENCOUNTER — Other Ambulatory Visit: Payer: Self-pay | Admitting: Psychiatry

## 2019-04-22 ENCOUNTER — Telehealth: Payer: Self-pay | Admitting: Family Medicine

## 2019-04-22 NOTE — Telephone Encounter (Signed)
Please contact the patient She will need to [place].

## 2019-04-23 NOTE — Telephone Encounter (Signed)
Not until she is completely over the lyrica reaction. There really isn't a substitute so she would need to be seen.

## 2019-04-23 NOTE — Telephone Encounter (Signed)
Patient aware.

## 2019-04-23 NOTE — Telephone Encounter (Signed)
Patient aware to DC Lyrica.  Does patient need to start another medication

## 2019-04-26 ENCOUNTER — Other Ambulatory Visit: Payer: Self-pay | Admitting: Psychiatry

## 2019-04-26 NOTE — Telephone Encounter (Signed)
appt 10/22 

## 2019-05-06 ENCOUNTER — Other Ambulatory Visit: Payer: Self-pay

## 2019-05-07 ENCOUNTER — Encounter: Payer: Self-pay | Admitting: Family Medicine

## 2019-05-07 ENCOUNTER — Ambulatory Visit (INDEPENDENT_AMBULATORY_CARE_PROVIDER_SITE_OTHER): Payer: BC Managed Care – PPO | Admitting: Family Medicine

## 2019-05-07 VITALS — BP 126/79 | HR 96 | Temp 96.2°F | Ht 64.0 in | Wt 314.2 lb

## 2019-05-07 DIAGNOSIS — E039 Hypothyroidism, unspecified: Secondary | ICD-10-CM

## 2019-05-07 DIAGNOSIS — Z6841 Body Mass Index (BMI) 40.0 and over, adult: Secondary | ICD-10-CM

## 2019-05-07 DIAGNOSIS — M5416 Radiculopathy, lumbar region: Secondary | ICD-10-CM | POA: Diagnosis not present

## 2019-05-07 DIAGNOSIS — I10 Essential (primary) hypertension: Secondary | ICD-10-CM | POA: Diagnosis not present

## 2019-05-07 DIAGNOSIS — J4520 Mild intermittent asthma, uncomplicated: Secondary | ICD-10-CM

## 2019-05-07 MED ORDER — LEVOTHYROXINE SODIUM 25 MCG PO TABS: ORAL_TABLET | ORAL | 1 refills | Status: DC

## 2019-05-07 MED ORDER — CLONIDINE HCL 0.3 MG PO TABS: 0.3000 mg | ORAL_TABLET | Freq: Two times a day (BID) | ORAL | 1 refills | Status: DC

## 2019-05-07 NOTE — Progress Notes (Signed)
Chief Complaint  Patient presents with  . Back Pain    1 month follow up- Patient states it is a little better.    HPI  Patient presents today for recheck of her back pain which she says is better.  She is following that through the pain clinic.  Her breathing now it is back to normal and it has resolved with the use of her inhalers noted in her med list.  She is concerned about blood pressure as well.  That has fallen to 126/79 with current treatment.  Of note is that her Catapres dose is uneven with taking 2 in the morning and 3 in the evening.  We will need to update that.  Otherwise her thyroid treatment caused her to get excessively shaky so she discontinued it after about 5 days.  PMH: Smoking status noted ROS: Review of Systems  Constitutional: Negative.   HENT: Negative.   Eyes: Negative for visual disturbance.  Respiratory: Negative for shortness of breath.   Cardiovascular: Negative for chest pain.  Gastrointestinal: Negative for abdominal pain.  Musculoskeletal: Positive for arthralgias and back pain.  Neurological: Positive for tremors.  Psychiatric/Behavioral: Negative for agitation. The patient is not nervous/anxious.      Objective: BP 126/79   Pulse 96   Temp (!) 96.2 F (35.7 C) (Temporal)   Ht 5\' 4"  (1.626 m)   Wt (!) 314 lb 3.2 oz (142.5 kg)   SpO2 94%   BMI 53.93 kg/m  Gen: NAD, alert, cooperative with exam HEENT: NCAT, EOMI, PERRL CV: RRR, good S1/S2, no murmur Resp: CTABL, no wheezes, non-labored Abd: SNTND, BS present, no guarding or organomegaly Ext: No edema, warm Neuro: Alert and oriented, No gross deficits  Assessment and plan:  1. Hypothyroidism, unspecified type   2. Mild intermittent asthma without complication   3. Essential hypertension, benign   4. Lumbar radiculopathy   5. Morbid obesity with BMI of 50.0-59.9, adult ([place])     Meds ordered this encounter  Medications  . cloNIDine (CATAPRES) 0.3 MG tablet    Sig: Take 1 tablet  (0.3 mg total) by mouth 2 (two) times daily.    Dispense:  180 tablet    Refill:  1  . levothyroxine (SYNTHROID) 25 MCG tablet    Sig: Take 1 tablet on an empty stomach daily for 2 weeks.  Then increase to 2 tablets daily until follow-up.    Dispense:  60 tablet    Refill:  1    No orders of the defined types were placed in this encounter.   Follow up as needed.  [name], MD

## 2019-05-08 ENCOUNTER — Other Ambulatory Visit: Payer: Self-pay | Admitting: Family Medicine

## 2019-05-16 ENCOUNTER — Other Ambulatory Visit: Payer: Self-pay

## 2019-05-16 ENCOUNTER — Encounter: Payer: Self-pay | Admitting: Psychiatry

## 2019-05-16 ENCOUNTER — Ambulatory Visit (INDEPENDENT_AMBULATORY_CARE_PROVIDER_SITE_OTHER): Payer: BC Managed Care – PPO | Admitting: Psychiatry

## 2019-05-16 DIAGNOSIS — F5105 Insomnia due to other mental disorder: Secondary | ICD-10-CM

## 2019-05-16 DIAGNOSIS — F411 Generalized anxiety disorder: Secondary | ICD-10-CM

## 2019-05-16 DIAGNOSIS — F331 Major depressive disorder, recurrent, moderate: Secondary | ICD-10-CM

## 2019-05-16 MED ORDER — LITHIUM CARBONATE ER 300 MG PO TBCR: 900.0000 mg | EXTENDED_RELEASE_TABLET | Freq: Every day | ORAL | 1 refills | Status: DC

## 2019-05-16 NOTE — Progress Notes (Signed)
[name] [id number] 1961/06/13 58 y.o.  Virtual Visit via Telephone Note  I connected with@ on 05/16/19 at 10:00 AM EDT by telephone and verified that I am speaking with the correct person using two identifiers.   I discussed the limitations, risks, security and privacy concerns of performing an evaluation and management service by telephone and the availability of in person appointments. I also discussed with the patient that there may be a patient responsible charge related to this service. The patient expressed understanding and agreed to proceed.   I discussed the assessment and treatment plan with the patient. The patient was provided an opportunity to ask questions and all were answered. The patient agreed with the plan and demonstrated an understanding of the instructions.   The patient was advised to call back or seek an in-person evaluation if the symptoms worsen or if the condition fails to improve as anticipated.  I provided 25 minutes of non-face-to-face time during this encounter.  The patient was located at home.  The provider was located at home.   Purnell Shoemaker, MD   Subjective:   Patient ID:  [name] is a 58 y.o. (DOB Apr 21, 1961) female.  Chief Complaint:  Chief Complaint  Patient presents with  . Follow-up    Medication Management  . Depression    Medication Management    HPI [name] presents for follow-up of depression anxiety and insomnia.  Seen last May without med changes.  Added Duexis NSAID for kneed pain.  Disc DDI.  Helped.  Still taking lithium 900, mirtazapine 30 HS, sertraline 200, Ambien.  In last month or so within 30 min of taking lithium gets nausea.    Mood irritable and she agrees with H over it.  Not sure why unless cooped up over Covid.  Denies feeling hyper or racing thoughts.  Denies other manic symptoms.  He is sleeping well with the current combination of Ambien and mirtazapine.  She is not markedly depressed.   She does complain of worsening tremor lately.  Sleep better with Ambien and mirtazapine together it's fine. Patient reports stable mood and denies depressed or irritable moods.  Patient denies any recent difficulty with anxiety.  Rare anxiety attack.  Mostly brought on by Covid and keeping gkids 8 and 56 yo.  Patient denies difficulty with sleep initiation or maintenance. Denies appetite disturbance.  Patient reports that energy and motivation have been good.  Patient denies any difficulty with concentration.  Patient denies any suicidal ideation.  Past Psychiatric Medication Trials: Lithium 900 SE, mirtazapine, clonidine 0 point 2 AM and 0.3 at bedtime, Ambien CR, sertraline, alprazolam, Lunesta NR Looks like she has been on sertraline plus lithium since about 2016   Review of Systems:  Review of Systems  Respiratory: Positive for wheezing.   Neurological: Negative for tremors and weakness.  Psychiatric/Behavioral: Positive for depression.    Medications: I have reviewed the patient's current medications.  Current Outpatient Medications  Medication Sig Dispense Refill  . albuterol (PROVENTIL HFA;VENTOLIN HFA) 108 (90 Base) MCG/ACT inhaler Inhale 2 puffs into the lungs every 6 (six) hours as needed for wheezing or shortness of breath. 1 Inhaler 0  . amLODipine (NORVASC) 10 MG tablet Take 1 tablet (10 mg total) by mouth daily. 90 tablet 1  . cloNIDine (CATAPRES) 0.3 MG tablet Take 1 tablet (0.3 mg total) by mouth 2 (two) times daily. 180 tablet 1  . DUEXIS 800-26.6 MG TABS Take 1 tablet by mouth 3 (  three) times daily.    . fluticasone furoate-vilanterol (BREO ELLIPTA) 200-25 MCG/INH AEPB Inhale 1 puff into the lungs daily. (Patient taking differently: Inhale 1 puff into the lungs at bedtime. ) 1 each 11  . furosemide (LASIX) 20 MG tablet Take 1 tablet (20 mg total) by mouth daily. 90 tablet 1  . levothyroxine (SYNTHROID) 25 MCG tablet Take 1 tablet on an empty stomach daily for 2 weeks.   Then increase to 2 tablets daily until follow-up. 60 tablet 1  . lisinopril (ZESTRIL) 20 MG tablet Take 1 tablet (20 mg total) by mouth daily. 90 tablet 1  . lithium 300 MG tablet Take 3 tablets (900 mg total) by mouth at bedtime. 270 tablet 1  . mirtazapine (REMERON) 30 MG tablet TAKE 1 TABLET (30 MG TOTAL) BY MOUTH AT BEDTIME. 90 tablet 2  . montelukast (SINGULAIR) 10 MG tablet TAKE 1 TABLET BY MOUTH EVERYDAY AT BEDTIME 90 tablet 1  . pravastatin (PRAVACHOL) 40 MG tablet Take 1 tablet (40 mg total) by mouth at bedtime. 90 tablet 1  . promethazine (PHENERGAN) 25 MG tablet TAKE 1 TABLET BY MOUTH EVERY 6 HOURS AS NEEDED FOR NAUSEA 30 tablet 1  . rizatriptan (MAXALT) 10 MG tablet Take 10 mg by mouth as needed for migraine (may repeat in 2 hours if needed.).     Marland Kitchen sertraline (ZOLOFT) 100 MG tablet Take 1 tablet (100 mg total) by mouth 2 (two) times daily. (Patient taking differently: Take 100 mg by mouth at bedtime. ) 60 tablet 2  . zolpidem (AMBIEN CR) 12.5 MG CR tablet TAKE 1 TABLET (12.5 MG TOTAL) BY MOUTH AT BEDTIME AS NEEDED FOR SLEEP. 30 tablet 0   No current facility-administered medications for this visit.     Medication Side Effects: None had nausea on lithium capsules but not on lithium tablets, tremor worse  Allergies:  Allergies  Allergen Reactions  . Adhesive [Tape] Other (See Comments)    Peeled off skin  . Ceclor [Cefaclor] Other (See Comments)    Blisters on hands  . Chocolate Other (See Comments)    migraines  . Venlafaxine Other (See Comments)    "refuses to take - makes me feel like I am dying"    Past Medical History:  Diagnosis Date  . Anemia    hx of with pregnancy   . Anxiety   . Arthritis   . Asthma   . Colon polyps   . Depression   . GERD (gastroesophageal reflux disease)   . Headache    occasional headaches   . High cholesterol   . Hypertension   . PONV (postoperative nausea and vomiting)   . Shortness of breath dyspnea    due to weight gain   .  Sleep apnea    has not use CPAP machine in 2 yrs/ DOES NOT KNOW IF SHE NEEDS TO USE C - PAP    Family History  Problem Relation Age of Onset  . Alcohol abuse Mother   . Arthritis Mother   . Heart disease Mother   . Hyperlipidemia Father   . Hypertension Father   . Heart disease Father 42       CABG  . Gout Father   . Hypertension Sister   . Cancer Brother        colon, lung, liver  . Cancer Maternal Grandmother        breast  . Cancer Daughter        NEUROBLASTOMA    Social  History   Socioeconomic History  . Marital status: Married    Spouse name: Not on file  . Number of children: Not on file  . Years of education: Not on file  . Highest education level: Not on file  Occupational History  . Not on file  Social Needs  . Financial resource strain: Not on file  . Food insecurity    Worry: Not on file    Inability: Not on file  . Transportation needs    Medical: Not on file    Non-medical: Not on file  Tobacco Use  . Smoking status: Never Smoker  . Smokeless tobacco: Never Used  Substance and Sexual Activity  . Alcohol use: No  . Drug use: No  . Sexual activity: Not on file  Lifestyle  . Physical activity    Days per week: Not on file    Minutes per session: Not on file  . Stress: Not on file  Relationships  . Social Musician on phone: Not on file    Gets together: Not on file    Attends religious service: Not on file    Active member of club or organization: Not on file    Attends meetings of clubs or organizations: Not on file    Relationship status: Not on file  . Intimate partner violence    Fear of current or ex partner: Not on file    Emotionally abused: Not on file    Physically abused: Not on file    Forced sexual activity: Not on file  Other Topics Concern  . Not on file  Social History Narrative  . Not on file    Past Medical History, Surgical history, Social history, and Family history were reviewed and updated as  appropriate.   Please see review of systems for further details on the patient's review from today.   Objective:   Physical Exam:  There were no vitals taken for this visit.  Physical Exam Neurological:     Mental Status: She is alert and oriented to person, place, and time.     Cranial Nerves: No dysarthria.  Psychiatric:        Attention and Perception: Attention normal.        Mood and Affect: Mood normal.        Speech: Speech normal.        Behavior: Behavior is cooperative.        Thought Content: Thought content normal. Thought content is not paranoid or delusional. Thought content does not include homicidal or suicidal ideation. Thought content does not include homicidal or suicidal plan.        Cognition and Memory: Cognition and memory normal.        Judgment: Judgment normal.     Lab Review:     Component Value Date/Time   NA 137 04/02/2019 1354   K 4.1 04/02/2019 1354   CL 99 04/02/2019 1354   CO2 22 04/02/2019 1354   GLUCOSE 102 (H) 04/02/2019 1354   GLUCOSE 99 02/08/2019 1515   BUN 17 04/02/2019 1354   CREATININE 1.06 (H) 04/02/2019 1354   CALCIUM 9.4 04/02/2019 1354   PROT 7.3 04/02/2019 1354   ALBUMIN 4.7 04/02/2019 1354   AST 35 04/02/2019 1354   ALT 39 (H) 04/02/2019 1354   ALKPHOS 112 04/02/2019 1354   BILITOT 0.3 04/02/2019 1354   GFRNONAA 59 (L) 04/02/2019 1354   GFRAA 68 04/02/2019 1354  Component Value Date/Time   WBC 9.5 04/02/2019 1354   WBC 8.5 02/08/2019 1515   RBC 4.49 04/02/2019 1354   RBC 4.37 02/08/2019 1515   HGB 13.1 04/02/2019 1354   HCT 38.9 04/02/2019 1354   PLT 250 04/02/2019 1354   MCV 87 04/02/2019 1354   MCH 29.2 04/02/2019 1354   MCH 29.3 02/08/2019 1515   MCHC 33.7 04/02/2019 1354   MCHC 31.7 02/08/2019 1515   RDW 14.5 04/02/2019 1354   LYMPHSABS 1.8 04/02/2019 1354   MONOABS 0.5 02/08/2019 1515   EOSABS 0.4 04/02/2019 1354   BASOSABS 0.1 04/02/2019 1354    No results found for: POCLITH, LITHIUM   No  results found for: PHENYTOIN, PHENOBARB, VALPROATE, CBMZ   Lithium level per PCP about 3 mos ago and ok by her report.  Get level from them.  .res Assessment: Plan:    Major depressive disorder, recurrent episode, moderate (HCC) - Plan: CANCELED: Lithium level  Generalized anxiety disorder  Insomnia due to mental condition   Overall she feels satisfied with the medication and feels that she is doing as well emotionally and she can under the circumstances of the Covid virus.  That has created additional stress and occasional insomnia.  Generally she sleeps well with the combination of mirtazapine plus Ambien.  She is tolerating the medications well doing that.  We discussed side effect risk including risk of amnesia and falls in the middle of the night.  She will be cautious about that and has not had these difficulties.  Counseled patient regarding potential benefits, risks, and side effects of lithium to include potential risk of lithium affecting thyroid and renal function.  Discussed need for periodic lab monitoring to determine drug level and to assess for potential adverse effects.  Counseled patient regarding signs and symptoms of lithium toxicity and advised that they notify office immediately or seek urgent medical attention if experiencing these signs and symptoms.  Patient advised to contact office with any questions or concerns.  She is not had any recent lithium levels done. Need lithium level.  Disc how to get it.  She will go to [facility].  Disc DDI with NSAID added and risk of toxicity.  She's having more tremor and lithium level may now be high.  We may have to reduce the lithium dosage.  She will get this level as soon as possible  DT nausea from lithium switch to lithium CR 300mg  3 q HS.  For irritability rec trial reduction mirtazapine to 15 mg HS.   It is possible that the noradrenergic effect of mirtazapine at the higher dose might contribute to irritability.  She does  not appear to be manic.  The low-dose mirtazapine should be just is adequate for sleep.  Call us back if there is any problems.  Follow-up 4 to 6 weeks.  Meredith Staggersarey Cottle, MD, DFAPA  Please see After Visit Summary for patient specific instructions.  Future Appointments  Date Time Provider Department Center  06/05/2019  9:55 AM Mechele ClaudeStacks, [place], MD [facility] None  06/18/2019 10:30 AM [facility]AL WELLNESS VISIT [facility] None    No orders of the defined types were placed in this encounter.     -------------------------------

## 2019-05-22 ENCOUNTER — Other Ambulatory Visit: Payer: Self-pay | Admitting: Psychiatry

## 2019-06-05 ENCOUNTER — Encounter: Payer: Self-pay | Admitting: Family Medicine

## 2019-06-05 ENCOUNTER — Ambulatory Visit (INDEPENDENT_AMBULATORY_CARE_PROVIDER_SITE_OTHER): Payer: BC Managed Care – PPO | Admitting: Family Medicine

## 2019-06-05 ENCOUNTER — Other Ambulatory Visit: Payer: Self-pay

## 2019-06-05 VITALS — BP 142/81 | HR 94 | Temp 97.7°F | Ht 64.0 in | Wt 313.4 lb

## 2019-06-05 DIAGNOSIS — F3177 Bipolar disorder, in partial remission, most recent episode mixed: Secondary | ICD-10-CM

## 2019-06-05 DIAGNOSIS — M5416 Radiculopathy, lumbar region: Secondary | ICD-10-CM | POA: Diagnosis not present

## 2019-06-05 DIAGNOSIS — E039 Hypothyroidism, unspecified: Secondary | ICD-10-CM

## 2019-06-05 DIAGNOSIS — J4520 Mild intermittent asthma, uncomplicated: Secondary | ICD-10-CM

## 2019-06-05 DIAGNOSIS — Z6841 Body Mass Index (BMI) 40.0 and over, adult: Secondary | ICD-10-CM

## 2019-06-05 DIAGNOSIS — I10 Essential (primary) hypertension: Secondary | ICD-10-CM

## 2019-06-05 NOTE — Patient Instructions (Signed)
Bring ALL of your medication bottles with you to every office visit! DASH Eating Plan DASH stands for "Dietary Approaches to Stop Hypertension." The DASH eating plan is a healthy eating plan that has been shown to reduce high blood pressure (hypertension). It may also reduce your risk for type 2 diabetes, heart disease, and stroke. The DASH eating plan may also help with weight loss. What are tips for following this plan?  General guidelines  Avoid eating more than 2,300 mg (milligrams) of salt (sodium) a day. If you have hypertension, you may need to reduce your sodium intake to 1,500 mg a day.  Limit alcohol intake to no more than 1 drink a day for nonpregnant women and 2 drinks a day for men. One drink equals 12 oz of beer, 5 oz of wine, or 1 oz of hard liquor.  Work with your health care provider to maintain a healthy body weight or to lose weight. Ask what an ideal weight is for you.  Get at least 30 minutes of exercise that causes your heart to beat faster (aerobic exercise) most days of the week. Activities may include walking, swimming, or biking.  Work with your health care provider or diet and nutrition specialist (dietitian) to adjust your eating plan to your individual calorie needs. Reading food labels   Check food labels for the amount of sodium per serving. Choose foods with less than 5 percent of the Daily Value of sodium. Generally, foods with less than 300 mg of sodium per serving fit into this eating plan.  To find whole grains, look for the word "whole" as the first word in the ingredient list. Shopping  Buy products labeled as "low-sodium" or "no salt added."  Buy fresh foods. Avoid canned foods and premade or frozen meals. Cooking  Avoid adding salt when cooking. Use salt-free seasonings or herbs instead of table salt or sea salt. Check with your health care provider or pharmacist before using salt substitutes.  Do not fry foods. Cook foods using healthy methods  such as baking, boiling, grilling, and broiling instead.  Cook with heart-healthy oils, such as olive, canola, soybean, or sunflower oil. Meal planning  Eat a balanced diet that includes: ? 5 or more servings of fruits and vegetables each day. At each meal, try to fill half of your plate with fruits and vegetables. ? Up to 6-8 servings of whole grains each day. ? Less than 6 oz of lean meat, poultry, or fish each day. A 3-oz serving of meat is about the same size as a deck of cards. One egg equals 1 oz. ? 2 servings of low-fat dairy each day. ? A serving of nuts, seeds, or beans 5 times each week. ? Heart-healthy fats. Healthy fats called Omega-3 fatty acids are found in foods such as flaxseeds and coldwater fish, like sardines, salmon, and mackerel.  Limit how much you eat of the following: ? Canned or prepackaged foods. ? Food that is high in trans fat, such as fried foods. ? Food that is high in saturated fat, such as fatty meat. ? Sweets, desserts, sugary drinks, and other foods with added sugar. ? Full-fat dairy products.  Do not salt foods before eating.  Try to eat at least 2 vegetarian meals each week.  Eat more home-cooked food and less restaurant, buffet, and fast food.  When eating at a restaurant, ask that your food be prepared with less salt or no salt, if possible. What foods are recommended? The items  listed may not be a complete list. Talk with your dietitian about what dietary choices are best for you. Grains Whole-grain or whole-wheat bread. Whole-grain or whole-wheat pasta. Brown rice. [place]. Bulgur. Whole-grain and low-sodium cereals. Pita bread. Low-fat, low-sodium crackers. Whole-wheat flour tortillas. Vegetables Fresh or frozen vegetables (raw, steamed, roasted, or grilled). Low-sodium or reduced-sodium tomato and vegetable juice. Low-sodium or reduced-sodium tomato sauce and tomato paste. Low-sodium or reduced-sodium canned vegetables. Fruits All  fresh, dried, or frozen fruit. Canned fruit in natural juice (without added sugar). Meat and other protein foods Skinless chicken or [place]. Ground chicken or [place]. Pork with fat trimmed off. Fish and seafood. Egg whites. Dried beans, peas, or lentils. Unsalted nuts, nut butters, and seeds. Unsalted canned beans. Lean cuts of beef with fat trimmed off. Low-sodium, lean deli meat. Dairy Low-fat (1%) or fat-free (skim) milk. Fat-free, low-fat, or reduced-fat cheeses. Nonfat, low-sodium ricotta or cottage cheese. Low-fat or nonfat yogurt. Low-fat, low-sodium cheese. Fats and oils Soft margarine without trans fats. Vegetable oil. Low-fat, reduced-fat, or light mayonnaise and salad dressings (reduced-sodium). Canola, safflower, olive, soybean, and sunflower oils. Avocado. Seasoning and other foods Herbs. Spices. Seasoning mixes without salt. Unsalted popcorn and pretzels. Fat-free sweets. What foods are not recommended? The items listed may not be a complete list. Talk with your dietitian about what dietary choices are best for you. Grains Baked goods made with fat, such as croissants, muffins, or some breads. Dry pasta or rice meal packs. Vegetables Creamed or fried vegetables. Vegetables in a cheese sauce. Regular canned vegetables (not low-sodium or reduced-sodium). Regular canned tomato sauce and paste (not low-sodium or reduced-sodium). Regular tomato and vegetable juice (not low-sodium or reduced-sodium). Angie Fava. Olives. Fruits Canned fruit in a light or heavy syrup. Fried fruit. Fruit in cream or butter sauce. Meat and other protein foods Fatty cuts of meat. Ribs. Fried meat. Berniece Salines. Sausage. Bologna and other processed lunch meats. Salami. Fatback. Hotdogs. Bratwurst. Salted nuts and seeds. Canned beans with added salt. Canned or smoked fish. Whole eggs or egg yolks. Chicken or [place] with skin. Dairy Whole or 2% milk, cream, and half-and-half. Whole or full-fat cream cheese. Whole-fat or  sweetened yogurt. Full-fat cheese. Nondairy creamers. Whipped toppings. Processed cheese and cheese spreads. Fats and oils Butter. Stick margarine. Lard. Shortening. Ghee. Bacon fat. Tropical oils, such as coconut, palm kernel, or palm oil. Seasoning and other foods Salted popcorn and pretzels. Onion salt, garlic salt, seasoned salt, table salt, and sea salt. Worcestershire sauce. Tartar sauce. Barbecue sauce. Teriyaki sauce. Soy sauce, including reduced-sodium. Steak sauce. Canned and packaged gravies. Fish sauce. Oyster sauce. Cocktail sauce. Horseradish that you find on the shelf. Ketchup. Mustard. Meat flavorings and tenderizers. Bouillon cubes. Hot sauce and Tabasco sauce. Premade or packaged marinades. Premade or packaged taco seasonings. Relishes. Regular salad dressings. Where to find more information:  National Heart, Lung, and [place]: https://wilson-eaton.com/  American Heart Association: www.heart.org Summary  The DASH eating plan is a healthy eating plan that has been shown to reduce high blood pressure (hypertension). It may also reduce your risk for type 2 diabetes, heart disease, and stroke.  With the DASH eating plan, you should limit salt (sodium) intake to 2,300 mg a day. If you have hypertension, you may need to reduce your sodium intake to 1,500 mg a day.  When on the DASH eating plan, aim to eat more fresh fruits and vegetables, whole grains, lean proteins, low-fat dairy, and heart-healthy fats.  Work with your health care provider or  diet and nutrition specialist (dietitian) to adjust your eating plan to your individual calorie needs. This information is not intended to replace advice given to you by your health care provider. Make sure you discuss any questions you have with your health care provider. Document Released: 06/30/2011 Document Revised: 06/23/2017 Document Reviewed: 07/04/2016 Elsevier Patient Education  2020 [employer].

## 2019-06-05 NOTE — Progress Notes (Signed)
Subjective:  Patient ID: [name], female    DOB: [birth year]  Age: 58 y.o. MRN: [id number]  CC: Back Pain (1 mth follow up)   HPI [name] presents for  follow-up of hypertension. Patient has no history of headache chest pain or shortness of breath or recent cough. Patient also denies symptoms of TIA such as focal numbness or weakness. Patient denies side effects from medication. States taking it regularly.   follow-up on  thyroid. The patient has a history of hypothyroidism . Pt. denies any change in  voice, loss of hair, heat or cold intolerance. Energy level has been adequate to good. Patient denies constipation and diarrhea. No myxedema. Medication is as noted below. Verified that pt is taking it daily on an empty stomach. Well tolerated.  Currently using lithium for Bipolar condition.   History Adrine has a past medical history of Anemia, Anxiety, Arthritis, Asthma, Colon polyps, Depression, GERD (gastroesophageal reflux disease), Headache, High cholesterol, Hypertension, PONV (postoperative nausea and vomiting), Shortness of breath dyspnea, and Sleep apnea.   She has a past surgical history that includes Endometrial ablation w/ novasure; Carpal tunnel release (2007); trigger finger release surgeyr ; Tubal ligation; Knee arthroscopy; Knee arthroscopy with medial menisectomy (Right, 10/22/2013); Back surgery (2012); Colonoscopy; Excision metacarpal mass (Right, 03/10/2014); Total knee arthroplasty (Left, 04/16/2014); Irrigation and debridement knee (Left, 05/23/2014); and Total knee arthroplasty (Right, 12/23/2014).   Her family history includes Alcohol abuse in her mother; Arthritis in her mother; Cancer in her brother, daughter, and maternal grandmother; Gout in her father; Heart disease in her mother; Heart disease (age of onset: 56) in her father; Hyperlipidemia in her father; Hypertension in her father and sister.She reports that she has never smoked. She has never used smokeless  tobacco. She reports that she does not drink alcohol or use drugs.  Current Outpatient Medications on File Prior to Visit  Medication Sig Dispense Refill   albuterol (PROVENTIL HFA;VENTOLIN HFA) 108 (90 Base) MCG/ACT inhaler Inhale 2 puffs into the lungs every 6 (six) hours as needed for wheezing or shortness of breath. 1 Inhaler 0   amLODipine (NORVASC) 10 MG tablet Take 1 tablet (10 mg total) by mouth daily. 90 tablet 1   cloNIDine (CATAPRES) 0.3 MG tablet Take 1 tablet (0.3 mg total) by mouth 2 (two) times daily. 180 tablet 1   DUEXIS 800-26.6 MG TABS Take 1 tablet by mouth 3 (three) times daily.     fluticasone furoate-vilanterol (BREO ELLIPTA) 200-25 MCG/INH AEPB Inhale 1 puff into the lungs daily. (Patient taking differently: Inhale 1 puff into the lungs at bedtime. ) 1 each 11   furosemide (LASIX) 20 MG tablet Take 1 tablet (20 mg total) by mouth daily. 90 tablet 1   levothyroxine (SYNTHROID) 25 MCG tablet Take 1 tablet on an empty stomach daily for 2 weeks.  Then increase to 2 tablets daily until follow-up. 60 tablet 1   lisinopril (ZESTRIL) 20 MG tablet Take 1 tablet (20 mg total) by mouth daily. 90 tablet 1   lithium carbonate (LITHOBID) 300 MG CR tablet Take 3 tablets (900 mg total) by mouth at bedtime. 90 tablet 1   mirtazapine (REMERON) 30 MG tablet TAKE 1 TABLET (30 MG TOTAL) BY MOUTH AT BEDTIME. 90 tablet 2   montelukast (SINGULAIR) 10 MG tablet TAKE 1 TABLET BY MOUTH EVERYDAY AT BEDTIME 90 tablet 1   pravastatin (PRAVACHOL) 40 MG tablet Take 1 tablet (40 mg total) by mouth at bedtime. 90 tablet 1  promethazine (PHENERGAN) 25 MG tablet TAKE 1 TABLET BY MOUTH EVERY 6 HOURS AS NEEDED FOR NAUSEA 30 tablet 1   rizatriptan (MAXALT) 10 MG tablet Take 10 mg by mouth as needed for migraine (may repeat in 2 hours if needed.).      sertraline (ZOLOFT) 100 MG tablet Take 1 tablet (100 mg total) by mouth 2 (two) times daily. (Patient taking differently: Take 100 mg by mouth at  bedtime. ) 60 tablet 2   zolpidem (AMBIEN CR) 12.5 MG CR tablet TAKE 1 TABLET (12.5 MG TOTAL) BY MOUTH AT BEDTIME AS NEEDED FOR SLEEP. 30 tablet 1   No current facility-administered medications on file prior to visit.     ROS Review of Systems  Constitutional: Negative.   HENT: Negative for congestion.   Eyes: Negative for visual disturbance.  Respiratory: Negative for shortness of breath.   Cardiovascular: Negative for chest pain.  Gastrointestinal: Negative for abdominal pain, constipation, diarrhea, nausea and vomiting.  Genitourinary: Negative for difficulty urinating.  Musculoskeletal: Negative for arthralgias and myalgias.  Neurological: Negative for headaches.  Psychiatric/Behavioral: Negative for sleep disturbance.    Objective:  BP (!) 142/81    Pulse 94    Temp 97.7 F (36.5 C) (Temporal)    Ht 5\' 4"  (1.626 m)    Wt (!) 313 lb 6.4 oz (142.2 kg)    SpO2 94%    BMI 53.79 kg/m   BP Readings from Last 3 Encounters:  06/05/19 (!) 142/81  05/07/19 126/79  04/18/19 (!) 102/49    Wt Readings from Last 3 Encounters:  06/05/19 (!) 313 lb 6.4 oz (142.2 kg)  05/07/19 (!) 314 lb 3.2 oz (142.5 kg)  04/18/19 (!) 333 lb (151 kg)     Physical Exam Constitutional:      General: She is not in acute distress.    Appearance: She is well-developed.  HENT:     Head: Normocephalic and atraumatic.  Eyes:     Conjunctiva/sclera: Conjunctivae normal.     Pupils: Pupils are equal, round, and reactive to light.  Neck:     Musculoskeletal: Normal range of motion and neck supple.     Thyroid: No thyromegaly.  Cardiovascular:     Rate and Rhythm: Normal rate and regular rhythm.     Heart sounds: Normal heart sounds. No murmur.  Pulmonary:     Effort: Pulmonary effort is normal. No respiratory distress.     Breath sounds: Normal breath sounds. No wheezing or rales.  Abdominal:     General: Bowel sounds are normal. There is no distension.     Palpations: Abdomen is soft.      Tenderness: There is no abdominal tenderness.  Musculoskeletal: Normal range of motion.  Lymphadenopathy:     Cervical: No cervical adenopathy.  Skin:    General: Skin is warm and dry.  Neurological:     Mental Status: She is alert and oriented to person, place, and time.  Psychiatric:        Behavior: Behavior normal.        Thought Content: Thought content normal.        Judgment: Judgment normal.       Assessment & Plan:   [name] was seen today for back pain.  Diagnoses and all orders for this visit:  Hypothyroidism, unspecified type -     TSH + free T4 -     Lithium level -     CMP  Morbid obesity with BMI of 50.0-59.9, adult (HCC) -  CMP  Bipolar disorder, in partial remission, most recent episode mixed (HCC) -     Lithium level -     CMP  Lumbar radiculopathy  Mild intermittent asthma without complication  Essential hypertension, benign -     CMP   Allergies as of 06/05/2019      Reactions   Adhesive [tape] Other (See Comments)   Peeled off skin   Ceclor [cefaclor] Other (See Comments)   Blisters on hands   Chocolate Other (See Comments)   migraines   Venlafaxine Other (See Comments)   "refuses to take - makes me feel like I am dying"      Medication List       Accurate as of June 05, 2019  7:47 PM. If you have any questions, ask your nurse or doctor.        albuterol 108 (90 Base) MCG/ACT inhaler Commonly known as: VENTOLIN HFA Inhale 2 puffs into the lungs every 6 (six) hours as needed for wheezing or shortness of breath.   amLODipine 10 MG tablet Commonly known as: NORVASC Take 1 tablet (10 mg total) by mouth daily.   cloNIDine 0.3 MG tablet Commonly known as: CATAPRES Take 1 tablet (0.3 mg total) by mouth 2 (two) times daily.   Duexis 800-26.6 MG Tabs Generic drug: Ibuprofen-Famotidine Take 1 tablet by mouth 3 (three) times daily.   fluticasone furoate-vilanterol 200-25 MCG/INH Aepb Commonly known as: Breo Ellipta Inhale  1 puff into the lungs daily. What changed: when to take this   furosemide 20 MG tablet Commonly known as: LASIX Take 1 tablet (20 mg total) by mouth daily.   levothyroxine 25 MCG tablet Commonly known as: SYNTHROID Take 1 tablet on an empty stomach daily for 2 weeks.  Then increase to 2 tablets daily until follow-up.   lisinopril 20 MG tablet Commonly known as: ZESTRIL Take 1 tablet (20 mg total) by mouth daily.   lithium carbonate 300 MG CR tablet Commonly known as: LITHOBID Take 3 tablets (900 mg total) by mouth at bedtime.   mirtazapine 30 MG tablet Commonly known as: REMERON TAKE 1 TABLET (30 MG TOTAL) BY MOUTH AT BEDTIME.   montelukast 10 MG tablet Commonly known as: SINGULAIR TAKE 1 TABLET BY MOUTH EVERYDAY AT BEDTIME   pravastatin 40 MG tablet Commonly known as: PRAVACHOL Take 1 tablet (40 mg total) by mouth at bedtime.   promethazine 25 MG tablet Commonly known as: PHENERGAN TAKE 1 TABLET BY MOUTH EVERY 6 HOURS AS NEEDED FOR NAUSEA   rizatriptan 10 MG tablet Commonly known as: MAXALT Take 10 mg by mouth as needed for migraine (may repeat in 2 hours if needed.).   sertraline 100 MG tablet Commonly known as: ZOLOFT Take 1 tablet (100 mg total) by mouth 2 (two) times daily. What changed: when to take this   zolpidem 12.5 MG CR tablet Commonly known as: AMBIEN CR TAKE 1 TABLET (12.5 MG TOTAL) BY MOUTH AT BEDTIME AS NEEDED FOR SLEEP.       No orders of the defined types were placed in this encounter.   DASH handout given. Also pt. Was encouraged to pursue weight loss. Various strategies reviewed.  Follow-up: Return in about 3 months (around 09/05/2019).  [name], M.D.

## 2019-06-06 LAB — CMP14+EGFR
ALT: 21 IU/L (ref 0–32)
AST: 23 IU/L (ref 0–40)
Albumin/Globulin Ratio: 1.7 (ref 1.2–2.2)
Albumin: 4.5 g/dL (ref 3.8–4.9)
Alkaline Phosphatase: 119 IU/L — ABNORMAL HIGH (ref 39–117)
BUN/Creatinine Ratio: 11 (ref 9–23)
BUN: 12 mg/dL (ref 6–24)
Bilirubin Total: 0.3 mg/dL (ref 0.0–1.2)
CO2: 24 mmol/L (ref 20–29)
Calcium: 10.1 mg/dL (ref 8.7–10.2)
Chloride: 103 mmol/L (ref 96–106)
Creatinine, Ser: 1.11 mg/dL — ABNORMAL HIGH (ref 0.57–1.00)
GFR calc Af Amer: 64 mL/min/{1.73_m2} (ref >59)
GFR calc non Af Amer: 56 mL/min/{1.73_m2} — ABNORMAL LOW (ref >59)
Globulin, Total: 2.6 g/dL (ref 1.5–4.5)
Glucose: 131 mg/dL — ABNORMAL HIGH (ref 65–99)
Potassium: 4.1 mmol/L (ref 3.5–5.2)
Sodium: 141 mmol/L (ref 134–144)
Total Protein: 7.1 g/dL (ref 6.0–8.5)

## 2019-06-06 LAB — TSH+FREE T4
Free T4: 0.96 ng/dL (ref 0.82–1.77)
TSH: 5.47 u[IU]/mL — ABNORMAL HIGH (ref 0.450–4.500)

## 2019-06-06 LAB — LITHIUM LEVEL: Lithium Lvl: 0.7 mmol/L (ref 0.6–1.2)

## 2019-06-10 ENCOUNTER — Other Ambulatory Visit: Payer: Self-pay | Admitting: *Deleted

## 2019-06-10 ENCOUNTER — Other Ambulatory Visit: Payer: Self-pay | Admitting: Family Medicine

## 2019-06-10 DIAGNOSIS — E039 Hypothyroidism, unspecified: Secondary | ICD-10-CM

## 2019-06-10 MED ORDER — LEVOTHYROXINE SODIUM 75 MCG PO TABS: ORAL_TABLET | ORAL | 2 refills | Status: DC

## 2019-06-12 ENCOUNTER — Other Ambulatory Visit: Payer: Self-pay | Admitting: Psychiatry

## 2019-06-12 DIAGNOSIS — F331 Major depressive disorder, recurrent, moderate: Secondary | ICD-10-CM

## 2019-06-14 ENCOUNTER — Encounter: Payer: Self-pay | Admitting: Family Medicine

## 2019-06-14 ENCOUNTER — Ambulatory Visit (INDEPENDENT_AMBULATORY_CARE_PROVIDER_SITE_OTHER): Payer: BC Managed Care – PPO | Admitting: Family Medicine

## 2019-06-14 DIAGNOSIS — G8929 Other chronic pain: Secondary | ICD-10-CM

## 2019-06-14 DIAGNOSIS — M25561 Pain in right knee: Secondary | ICD-10-CM

## 2019-06-14 DIAGNOSIS — M25562 Pain in left knee: Secondary | ICD-10-CM | POA: Diagnosis not present

## 2019-06-14 MED ORDER — DUEXIS 800-26.6 MG PO TABS: 1.0000 | ORAL_TABLET | Freq: Three times a day (TID) | ORAL | 2 refills | Status: DC

## 2019-06-14 NOTE — Progress Notes (Signed)
Virtual Visit via Telephone Note  I connected with [name] on 06/14/19 at 8:08 AM by telephone and verified that I am speaking with the correct person using two identifiers. [name] is currently located at home and nobody is currently with her during this visit. The provider, Loman Brooklyn, FNP is located in their office at time of visit.  I discussed the limitations, risks, security and privacy concerns of performing an evaluation and management service by telephone and the availability of in person appointments. I also discussed with the patient that there may be a patient responsible charge related to this service. The patient expressed understanding and agreed to proceed.  Subjective: PCP: [name], MD  Chief Complaint  Patient presents with   Knee Pain   Knee Pain: Patient reports a history of bilateral knee replacements.  She has had 6 total knee surgeries.  She also has a titanium rod and 6 screws in her back.  She has a diagnosis of osteoarthritis.  She has a prescription for Duexis that she was getting from her orthopedic but has ran out.  When she called his office to get a refill she was told she needed an in person visit as they do not do telephone visits in order to get the refill.  She was advised to see if her PCP would fill.  She states this is the only thing that works for her pain.  She was previously getting this from [facility] in [place] where she is able to get this for $25, otherwise she is unable to afford the medication.    ROS: Per HPI  Current Outpatient Medications:    albuterol (PROVENTIL HFA;VENTOLIN HFA) 108 (90 Base) MCG/ACT inhaler, Inhale 2 puffs into the lungs every 6 (six) hours as needed for wheezing or shortness of breath., Disp: 1 Inhaler, Rfl: 0   amLODipine (NORVASC) 10 MG tablet, Take 1 tablet (10 mg total) by mouth daily., Disp: 90 tablet, Rfl: 1   cloNIDine (CATAPRES) 0.3 MG tablet, Take 1 tablet (0.3 mg total) by  mouth 2 (two) times daily., Disp: 180 tablet, Rfl: 1   DUEXIS 800-26.6 MG TABS, Take 1 tablet by mouth 3 (three) times daily., Disp: , Rfl:    fluticasone furoate-vilanterol (BREO ELLIPTA) 200-25 MCG/INH AEPB, Inhale 1 puff into the lungs daily. (Patient taking differently: Inhale 1 puff into the lungs at bedtime. ), Disp: 1 each, Rfl: 11   furosemide (LASIX) 20 MG tablet, Take 1 tablet (20 mg total) by mouth daily., Disp: 90 tablet, Rfl: 1   levothyroxine (SYNTHROID) 75 MCG tablet, Take 1 tablet on an empty stomach daily for 2 weeks.  Then increase to 2 tablets daily until follow-up., Disp: 30 tablet, Rfl: 2   lisinopril (ZESTRIL) 20 MG tablet, Take 1 tablet (20 mg total) by mouth daily., Disp: 90 tablet, Rfl: 1   lithium carbonate (LITHOBID) 300 MG CR tablet, TAKE 3 TABLETS (900 MG TOTAL) BY MOUTH AT BEDTIME., Disp: 270 tablet, Rfl: 1   mirtazapine (REMERON) 30 MG tablet, TAKE 1 TABLET (30 MG TOTAL) BY MOUTH AT BEDTIME., Disp: 90 tablet, Rfl: 2   montelukast (SINGULAIR) 10 MG tablet, TAKE 1 TABLET BY MOUTH EVERYDAY AT BEDTIME, Disp: 90 tablet, Rfl: 1   pravastatin (PRAVACHOL) 40 MG tablet, Take 1 tablet (40 mg total) by mouth at bedtime., Disp: 90 tablet, Rfl: 1   promethazine (PHENERGAN) 25 MG tablet, TAKE 1 TABLET BY MOUTH EVERY 6 HOURS AS NEEDED FOR NAUSEA,  Disp: 30 tablet, Rfl: 1   rizatriptan (MAXALT) 10 MG tablet, Take 10 mg by mouth as needed for migraine (may repeat in 2 hours if needed.). , Disp: , Rfl:    sertraline (ZOLOFT) 100 MG tablet, Take 1 tablet (100 mg total) by mouth 2 (two) times daily. (Patient taking differently: Take 100 mg by mouth at bedtime. ), Disp: 60 tablet, Rfl: 2   zolpidem (AMBIEN CR) 12.5 MG CR tablet, TAKE 1 TABLET (12.5 MG TOTAL) BY MOUTH AT BEDTIME AS NEEDED FOR SLEEP., Disp: 30 tablet, Rfl: 1  Allergies  Allergen Reactions   Adhesive [Tape] Other (See Comments)    Peeled off skin   Ceclor [Cefaclor] Other (See Comments)    Blisters on hands     Chocolate Other (See Comments)    migraines   Venlafaxine Other (See Comments)    "refuses to take - makes me feel like I am dying"   Past Medical History:  Diagnosis Date   Anemia    hx of with pregnancy    Anxiety    Arthritis    Asthma    Colon polyps    Depression    GERD (gastroesophageal reflux disease)    Headache    occasional headaches    High cholesterol    Hypertension    PONV (postoperative nausea and vomiting)    Shortness of breath dyspnea    due to weight gain    Sleep apnea    has not use CPAP machine in 2 yrs/ DOES NOT KNOW IF SHE NEEDS TO USE C - PAP    Observations/Objective: A&O  No respiratory distress or wheezing audible over the phone Mood, judgement, and thought processes all WNL  Assessment and Plan: 1. Chronic pain of both knees - Duexis refilled and sent to [facility] as requested.  - DUEXIS 800-26.6 MG TABS; Take 1 tablet by mouth 3 (three) times daily.  Dispense: 90 tablet; Refill: 2   Follow Up Instructions:  I discussed the assessment and treatment plan with the patient. The patient was provided an opportunity to ask questions and all were answered. The patient agreed with the plan and demonstrated an understanding of the instructions.   The patient was advised to call back or seek an in-person evaluation if the symptoms worsen or if the condition fails to improve as anticipated.  The above assessment and management plan was discussed with the patient. The patient verbalized understanding of and has agreed to the management plan. Patient is aware to call the clinic if symptoms persist or worsen. Patient is aware when to return to the clinic for a follow-up visit. Patient educated on when it is appropriate to go to the emergency department.   Time call ended: 8:13 AM  I provided 7 minutes of non-face-to-face time during this encounter.  Deliah Boston, MSN, APRN, FNP-C [facility] 06/14/19

## 2019-06-15 ENCOUNTER — Other Ambulatory Visit: Payer: Self-pay | Admitting: Psychiatry

## 2019-06-18 ENCOUNTER — Ambulatory Visit: Payer: BC Managed Care – PPO

## 2019-06-19 ENCOUNTER — Other Ambulatory Visit: Payer: Self-pay | Admitting: Family Medicine

## 2019-06-25 ENCOUNTER — Ambulatory Visit (INDEPENDENT_AMBULATORY_CARE_PROVIDER_SITE_OTHER): Payer: BC Managed Care – PPO | Admitting: Family Medicine

## 2019-06-25 ENCOUNTER — Encounter: Payer: Self-pay | Admitting: Family Medicine

## 2019-06-25 DIAGNOSIS — J4540 Moderate persistent asthma, uncomplicated: Secondary | ICD-10-CM

## 2019-06-25 DIAGNOSIS — M5416 Radiculopathy, lumbar region: Secondary | ICD-10-CM | POA: Diagnosis not present

## 2019-06-25 DIAGNOSIS — T56891A Toxic effect of other metals, accidental (unintentional), initial encounter: Secondary | ICD-10-CM

## 2019-06-25 HISTORY — DX: Toxic effect of other metals, accidental (unintentional), initial encounter: T56.891A

## 2019-06-25 MED ORDER — PREDNISONE 10 MG PO TABS: ORAL_TABLET | ORAL | 0 refills | Status: DC

## 2019-06-25 MED ORDER — ALBUTEROL SULFATE HFA 108 (90 BASE) MCG/ACT IN AERS: 2.0000 | INHALATION_SPRAY | Freq: Four times a day (QID) | RESPIRATORY_TRACT | 5 refills | Status: DC | PRN

## 2019-06-25 MED ORDER — CYCLOBENZAPRINE HCL 10 MG PO TABS: 10.0000 mg | ORAL_TABLET | Freq: Three times a day (TID) | ORAL | 1 refills | Status: DC | PRN

## 2019-06-25 MED ORDER — BREO ELLIPTA 200-25 MCG/INH IN AEPB: 1.0000 | INHALATION_SPRAY | Freq: Every day | RESPIRATORY_TRACT | 11 refills | Status: DC

## 2019-06-25 NOTE — Progress Notes (Signed)
Subjective:    Patient ID: [name], female    DOB: 19-May-1961, 58 y.o.   MRN: [id number]   HPI: [name] is a 58 y.o. female presenting for back pain is awful. Pain is from bra line to kidneys - the lower back.  8/10. Nothing helps except to lay on her side on the couch.   Breathing is good, but about out of Breo. It gets worse if she doesn't have it.    Depression screen [employer] 2/9 06/05/2019 05/07/2019 04/18/2019 04/02/2019 01/19/2016  Decreased Interest 0 0 0 3 1  Down, Depressed, Hopeless 0 0 0 0 0  PHQ - 2 Score 0 0 0 3 1  Altered sleeping - - 0 0 -  Tired, decreased energy - - 0 1 -  Change in appetite - - 0 3 -  Feeling bad or failure about yourself  - - 0 3 -  Trouble concentrating - - 0 1 -  Moving slowly or fidgety/restless - - 0 0 -  Suicidal thoughts - - 0 0 -  PHQ-9 Score - - 0 11 -  Difficult doing work/chores - - - Somewhat difficult -     Relevant past medical, surgical, family and social history reviewed and updated as indicated.  Interim medical history since our last visit reviewed. Allergies and medications reviewed and updated.  ROS:  Review of Systems  Constitutional: Negative.   HENT: Negative.   Eyes: Negative for visual disturbance.  Respiratory: Negative for shortness of breath.   Cardiovascular: Negative for chest pain.  Gastrointestinal: Negative for abdominal pain.  Musculoskeletal: Positive for arthralgias and back pain.     Social History   Tobacco Use  Smoking Status Never Smoker  Smokeless Tobacco Never Used       Objective:     Wt Readings from Last 3 Encounters:  06/05/19 (!) 313 lb 6.4 oz (142.2 kg)  05/07/19 (!) 314 lb 3.2 oz (142.5 kg)  04/18/19 (!) 333 lb (151 kg)     Exam deferred. Pt. Harboring due to COVID 19. Phone visit performed.   Assessment & Plan:   1. Moderate persistent asthma without complication   2. Lumbar radiculopathy     Meds ordered this encounter  Medications  . albuterol (VENTOLIN  HFA) 108 (90 Base) MCG/ACT inhaler    Sig: Inhale 2 puffs into the lungs every 6 (six) hours as needed for wheezing or shortness of breath.    Dispense:  18 g    Refill:  5  . fluticasone furoate-vilanterol (BREO ELLIPTA) 200-25 MCG/INH AEPB    Sig: Inhale 1 puff into the lungs daily.    Dispense:  1 each    Refill:  11  . cyclobenzaprine (FLEXERIL) 10 MG tablet    Sig: Take 1 tablet (10 mg total) by mouth 3 (three) times daily as needed for muscle spasms.    Dispense:  90 tablet    Refill:  1  . predniSONE (DELTASONE) 10 MG tablet    Sig: Take 5 daily for 3 days followed by 4,3,2 and 1 for 3 days each.    Dispense:  45 tablet    Refill:  0    No orders of the defined types were placed in this encounter.     Diagnoses and all orders for this visit:  Moderate persistent asthma without complication  Lumbar radiculopathy  Other orders -     albuterol (VENTOLIN HFA) 108 (90 Base) MCG/ACT inhaler; Inhale 2  puffs into the lungs every 6 (six) hours as needed for wheezing or shortness of breath. -     fluticasone furoate-vilanterol (BREO ELLIPTA) 200-25 MCG/INH AEPB; Inhale 1 puff into the lungs daily. -     cyclobenzaprine (FLEXERIL) 10 MG tablet; Take 1 tablet (10 mg total) by mouth 3 (three) times daily as needed for muscle spasms. -     predniSONE (DELTASONE) 10 MG tablet; Take 5 daily for 3 days followed by 4,3,2 and 1 for 3 days each.    Virtual Visit via telephone Note  I discussed the limitations, risks, security and privacy concerns of performing an evaluation and management service by telephone and the availability of in person appointments. The patient was identified with two identifiers. Pt.expressed understanding and agreed to proceed. Pt. Is at home. [name] is in his office.  Follow Up Instructions:   I discussed the assessment and treatment plan with the patient. The patient was provided an opportunity to ask questions and all were answered. The patient agreed  with the plan and demonstrated an understanding of the instructions.   The patient was advised to call back or seek an in-person evaluation if the symptoms worsen or if the condition fails to improve as anticipated.   Total minutes including chart review and phone contact time: 23   Follow up plan: No follow-ups on file.  Morgan Claude, MD [facility]

## 2019-07-01 ENCOUNTER — Encounter: Payer: Self-pay | Admitting: *Deleted

## 2019-07-01 NOTE — Progress Notes (Signed)
FNP and I were at my nurse-station ( with windows) when i noticed pt outside unsteadily walking. I walked outside to ask pt if she was ok. She walked on toward me. Rakes, FNP - asked if she needed a wheelchair and pt stated "yes". While Rakes went inside to get wheelchair pt lost her balance and fell on to concrete on office front porch area. she stated that she was ok and there were no seen injuries. She was able to get up on her own, while pulling up on the wheelchair. She states that she has fell x2 today and thinks it is coming from a previous back surgery. She was taken inside by wheelchair and was triaged for any injury. There were none. She states that she is ok and she will go back home. There were no appointments available here today and her pcp is working remote. An appointment was made for this week for pt to discuss falls, back and thyroid. she will come in on 07/04/19 to see Dr Livia Snellen.

## 2019-07-03 ENCOUNTER — Other Ambulatory Visit: Payer: Self-pay

## 2019-07-04 ENCOUNTER — Encounter: Payer: Self-pay | Admitting: Family Medicine

## 2019-07-04 ENCOUNTER — Ambulatory Visit (INDEPENDENT_AMBULATORY_CARE_PROVIDER_SITE_OTHER): Payer: BC Managed Care – PPO | Admitting: Family Medicine

## 2019-07-04 VITALS — BP 118/78 | HR 84 | Temp 98.9°F | Ht 64.0 in | Wt 310.0 lb

## 2019-07-04 DIAGNOSIS — M5416 Radiculopathy, lumbar region: Secondary | ICD-10-CM

## 2019-07-04 DIAGNOSIS — R251 Tremor, unspecified: Secondary | ICD-10-CM

## 2019-07-04 DIAGNOSIS — Z9114 Patient's other noncompliance with medication regimen: Secondary | ICD-10-CM | POA: Diagnosis not present

## 2019-07-04 DIAGNOSIS — F331 Major depressive disorder, recurrent, moderate: Secondary | ICD-10-CM | POA: Diagnosis not present

## 2019-07-04 DIAGNOSIS — F5104 Psychophysiologic insomnia: Secondary | ICD-10-CM

## 2019-07-04 MED ORDER — ZOLPIDEM TARTRATE ER 6.25 MG PO TBCR: 6.2500 mg | EXTENDED_RELEASE_TABLET | Freq: Every evening | ORAL | 0 refills | Status: DC | PRN

## 2019-07-04 MED ORDER — MIRTAZAPINE 15 MG PO TABS: 15.0000 mg | ORAL_TABLET | Freq: Every day | ORAL | 1 refills | Status: DC

## 2019-07-04 MED ORDER — PREGABALIN 100 MG PO CAPS: 100.0000 mg | ORAL_CAPSULE | Freq: Every day | ORAL | 1 refills | Status: DC

## 2019-07-04 MED ORDER — SERTRALINE HCL 50 MG PO TABS: 50.0000 mg | ORAL_TABLET | Freq: Every day | ORAL | 1 refills | Status: DC

## 2019-07-04 NOTE — Progress Notes (Signed)
Subjective:  Patient ID: [name], female    DOB: Mar 18, 1961  Age: 58 y.o. MRN: [id number]  CC: Medication Management   HPI Morgan GaulMarie S Pedraza presents for overusing her meds including zolpidem.She has been isolated and lonely. She "can't even give my dad a hug, and he ony lives a [place]half away." For the last week her husband has been working from home. He brings he in today because he has observed that she has been going hrough her meds  At an excessive rate.She took 30 days of ambien in just 12 days. She has doubled her  Mirtazapine and is taking extra zoloft. She has been obtunded, confused states her husband. She has a much worse tremor.   [facility] 07/04/2019 06/05/2019 05/07/2019  Decreased Interest 1 0 0  Down, Depressed, Hopeless 1 0 0  PHQ - 2 Score 2 0 0  Altered sleeping 0 - -  Tired, decreased energy 0 - -  Change in appetite 1 - -  Feeling bad or failure about yourself  0 - -  Trouble concentrating 0 - -  Moving slowly or fidgety/restless 1 - -  Suicidal thoughts 0 - -  PHQ-9 Score 4 - -  Difficult doing work/chores Not difficult at all - -    History [name] has a past medical history of Anemia, Anxiety, Arthritis, Asthma, Colon polyps, Depression, GERD (gastroesophageal reflux disease), Headache, High cholesterol, Hypertension, PONV (postoperative nausea and vomiting), Shortness of breath dyspnea, and Sleep apnea.   She has a past surgical history that includes Endometrial ablation w/ novasure; Carpal tunnel release (2007); trigger finger release surgeyr ; Tubal ligation; Knee arthroscopy; Knee arthroscopy with medial menisectomy (Right, 10/22/2013); Back surgery (2012); Colonoscopy; Excision metacarpal mass (Right, 03/10/2014); Total knee arthroplasty (Left, 04/16/2014); Irrigation and debridement knee (Left, 05/23/2014); and Total knee arthroplasty (Right, 12/23/2014).   Her family history includes Alcohol abuse in her mother; Arthritis in her  mother; Cancer in her brother, daughter, and maternal grandmother; Gout in her father; Heart disease in her mother; Heart disease (age of onset: 2975) in her father; Hyperlipidemia in her father; Hypertension in her father and sister.She reports that she has never smoked. She has never used smokeless tobacco. She reports that she does not drink alcohol or use drugs.    ROS Review of Systems  Constitutional: Positive for activity change. Negative for chills.  HENT: Negative.   Eyes: Negative for visual disturbance.  Respiratory: Negative for shortness of breath.   Cardiovascular: Negative for chest pain.  Gastrointestinal: Negative for abdominal pain.  Musculoskeletal: Positive for arthralgias.  Neurological: Positive for tremors.  Psychiatric/Behavioral: Positive for confusion and dysphoric mood. The patient is nervous/anxious.     Objective:  BP 118/78   Pulse 84   Temp 98.9 F (37.2 C) (Temporal)   Ht 5\' 4"  (1.626 m)   Wt (!) 310 lb (140.6 kg)   BMI 53.21 kg/m   BP Readings from Last 3 Encounters:  07/04/19 118/78  06/05/19 (!) 142/81  05/07/19 126/79    Wt Readings from Last 3 Encounters:  07/04/19 (!) 310 lb (140.6 kg)  06/05/19 (!) 313 lb 6.4 oz (142.2 kg)  05/07/19 (!) 314 lb 3.2 oz (142.5 kg)     Physical Exam Constitutional:      General: She is not in acute distress.    Appearance: She is well-developed. She is obese.  Cardiovascular:     Rate and Rhythm: Normal rate and regular rhythm.  Pulmonary:  Breath sounds: Normal breath sounds.  Musculoskeletal:        General: Normal range of motion.  Skin:    General: Skin is warm and dry.  Neurological:     Mental Status: She is alert and oriented to person, place, and time.     Coordination: Coordination normal.  Psychiatric:        Speech: Speech is delayed.        Behavior: Behavior normal.        Cognition and Memory: Cognition is impaired.        Judgment: Judgment is impulsive.        Assessment & Plan:   Tashya was seen today for medication management.  Diagnoses and all orders for this visit:  Tremor  Major depressive disorder, recurrent episode, moderate (HCC) -     mirtazapine (REMERON) 15 MG tablet; Take 1 tablet (15 mg total) by mouth at bedtime.  Overuse of medication  Lumbar radiculopathy  Psychophysiological insomnia  Other orders -     pregabalin (LYRICA) 100 MG capsule; Take 1 capsule (100 mg total) by mouth at bedtime. -     sertraline (ZOLOFT) 50 MG tablet; Take 1 tablet (50 mg total) by mouth at bedtime. -     Discontinue: zolpidem (AMBIEN CR) 6.25 MG CR tablet; Take 1 tablet (6.25 mg total) by mouth at bedtime as needed. -     zolpidem (AMBIEN CR) 6.25 MG CR tablet; Take 1 tablet (6.25 mg total) by mouth at bedtime as needed.       I have discontinued Lashawnda Hancox. Tschantz's promethazine and cyclobenzaprine. I have also changed her mirtazapine, pregabalin, and sertraline. Additionally, I am having her maintain her rizatriptan, amLODipine, furosemide, lisinopril, pravastatin, montelukast, cloNIDine, levothyroxine, lithium carbonate, Duexis, albuterol, Breo Ellipta, predniSONE, and zolpidem.  Allergies as of 07/04/2019      Reactions   Adhesive [tape] Other (See Comments)   Peeled off skin   Ceclor [cefaclor] Other (See Comments)   Blisters on hands   Chocolate Other (See Comments)   migraines   Venlafaxine Other (See Comments)   "refuses to take - makes me feel like I am dying"      Medication List       Accurate as of July 04, 2019  3:29 PM. If you have any questions, ask your nurse or doctor.        STOP taking these medications   cyclobenzaprine 10 MG tablet Commonly known as: FLEXERIL Stopped by: [name], MD   promethazine 25 MG tablet Commonly known as: PHENERGAN Stopped by: [name], MD     TAKE these medications   albuterol 108 (90 Base) MCG/ACT inhaler Commonly known as: VENTOLIN HFA Inhale 2 puffs into  the lungs every 6 (six) hours as needed for wheezing or shortness of breath.   amLODipine 10 MG tablet Commonly known as: NORVASC Take 1 tablet (10 mg total) by mouth daily.   Breo Ellipta 200-25 MCG/INH Aepb Generic drug: fluticasone furoate-vilanterol Inhale 1 puff into the lungs daily.   cloNIDine 0.3 MG tablet Commonly known as: CATAPRES Take 1 tablet (0.3 mg total) by mouth 2 (two) times daily.   Duexis 800-26.6 MG Tabs Generic drug: Ibuprofen-Famotidine Take 1 tablet by mouth 3 (three) times daily.   furosemide 20 MG tablet Commonly known as: LASIX Take 1 tablet (20 mg total) by mouth daily.   levothyroxine 75 MCG tablet Commonly known as: SYNTHROID Take 1 tablet on an empty stomach daily for 2  weeks.  Then increase to 2 tablets daily until follow-up.   lisinopril 20 MG tablet Commonly known as: ZESTRIL Take 1 tablet (20 mg total) by mouth daily.   lithium carbonate 300 MG CR tablet Commonly known as: LITHOBID TAKE 3 TABLETS (900 MG TOTAL) BY MOUTH AT BEDTIME.   mirtazapine 15 MG tablet Commonly known as: REMERON Take 1 tablet (15 mg total) by mouth at bedtime. What changed:   medication strength  how much to take Changed by: [name], MD   montelukast 10 MG tablet Commonly known as: SINGULAIR TAKE 1 TABLET BY MOUTH EVERYDAY AT BEDTIME   pravastatin 40 MG tablet Commonly known as: PRAVACHOL Take 1 tablet (40 mg total) by mouth at bedtime.   predniSONE 10 MG tablet Commonly known as: DELTASONE Take 5 daily for 3 days followed by 4,3,2 and 1 for 3 days each.   pregabalin 100 MG capsule Commonly known as: LYRICA Take 1 capsule (100 mg total) by mouth at bedtime. What changed:   medication strength  See the new instructions. Changed by: [name], MD   rizatriptan 10 MG tablet Commonly known as: MAXALT Take 10 mg by mouth as needed for migraine (may repeat in 2 hours if needed.).   sertraline 50 MG tablet Commonly known as: ZOLOFT  Take 1 tablet (50 mg total) by mouth at bedtime. What changed:   medication strength  how much to take  when to take this Changed by: [name], MD   zolpidem 6.25 MG CR tablet Commonly known as: AMBIEN CR Take 1 tablet (6.25 mg total) by mouth at bedtime as needed. What changed:   medication strength  how much to take Changed by: [name], MD        Follow-up: Return in about 1 month (around 08/04/2019).  [name], M.D.

## 2019-07-05 ENCOUNTER — Telehealth: Payer: Self-pay | Admitting: Family Medicine

## 2019-07-05 NOTE — Telephone Encounter (Signed)
Lmtcb- what medication ??

## 2019-07-09 ENCOUNTER — Encounter: Payer: Self-pay | Admitting: *Deleted

## 2019-07-09 ENCOUNTER — Ambulatory Visit (INDEPENDENT_AMBULATORY_CARE_PROVIDER_SITE_OTHER): Payer: BC Managed Care – PPO | Admitting: *Deleted

## 2019-07-09 DIAGNOSIS — Z0000 Encounter for general adult medical examination without abnormal findings: Secondary | ICD-10-CM

## 2019-07-09 NOTE — Patient Instructions (Signed)

## 2019-07-09 NOTE — Progress Notes (Addendum)
MEDICARE ANNUAL WELLNESS VISIT  07/09/2019  Telephone Visit Disclaimer This Medicare AWV was conducted by telephone due to national recommendations for restrictions regarding the COVID-19 Pandemic (e.g. social distancing).  I verified, using two identifiers, that I am speaking with [name] or their authorized healthcare agent. I discussed the limitations, risks, security, and privacy concerns of performing an evaluation and management service by telephone and the potential availability of an in-person appointment in the future. The patient expressed understanding and agreed to proceed.   Subjective:  [name] is a 58 y.o. female patient of Stacks, Broadus John, MD who had a Medicare Annual Wellness Visit today via telephone. [name] is Disabled and lives with their spouse. she has two children. she reports that she is socially active and does interact with friends/family regularly. she is minimally physically active and enjoys crocheting and word search puzzles.  Patient Care Team: [name], MD as PCP - General (Family Medicine) Cottle, Steva Ready., MD as Attending Physician (Psychiatry) Ranee Gosselin, MD as Consulting Physician (Orthopedic Surgery)  Advanced Directives 07/09/2019 02/08/2019 12/23/2014 12/23/2014 12/17/2014 06/30/2014 05/23/2014  Does Patient Have a Medical Advance Directive? No No No No No No No  Copy of Healthcare Power of Attorney in Chart? - - - - - - -  Would patient like information on creating a medical advance directive? Yes (ED - Information included in AVS) - No - patient declined information No - patient declined information No - patient declined information - No - patient declined information    Hospital Utilization Over the Past 12 Months: # of hospitalizations or ER visits: 1 # of surgeries: 0  Review of Systems    Patient reports that her overall health is unchanged compared to last year.  Negative except she suffers with chronic pain in her  back and knees.  Patient Reported Readings (BP, Pulse, CBG, Weight, etc) none  Pain Assessment Pain : 0-10 Pain Score: 5  Pain Type: Chronic pain Pain Location: Knee Pain Orientation: Lateral Pain Descriptors / Indicators: Sharp, Stabbing, Constant Pain Onset: Other (comment)(since surgery) Pain Frequency: Constant Pain Relieving Factors: Medicines Effect of Pain on Daily Activities: Limiting  Pain Relieving Factors: Medicines  Current Medications & Allergies (verified) Allergies as of 07/09/2019      Reactions   Adhesive [tape] Other (See Comments)   Peeled off skin   Ceclor [cefaclor] Other (See Comments)   Blisters on hands   Chocolate Other (See Comments)   migraines   Venlafaxine Other (See Comments)   "refuses to take - makes me feel like I am dying"      Medication List       Accurate as of July 09, 2019 12:01 PM. If you have any questions, ask your nurse or doctor.        STOP taking these medications   predniSONE 10 MG tablet Commonly known as: DELTASONE     TAKE these medications   albuterol 108 (90 Base) MCG/ACT inhaler Commonly known as: VENTOLIN HFA Inhale 2 puffs into the lungs every 6 (six) hours as needed for wheezing or shortness of breath.   amLODipine 10 MG tablet Commonly known as: NORVASC Take 1 tablet (10 mg total) by mouth daily.   Breo Ellipta 200-25 MCG/INH Aepb Generic drug: fluticasone furoate-vilanterol Inhale 1 puff into the lungs daily.   cloNIDine 0.3 MG tablet Commonly known as: CATAPRES Take 1 tablet (0.3 mg total) by mouth 2 (two) times daily.   Duexis 800-26.6  MG Tabs Generic drug: Ibuprofen-Famotidine Take 1 tablet by mouth 3 (three) times daily.   furosemide 20 MG tablet Commonly known as: LASIX Take 1 tablet (20 mg total) by mouth daily.   levothyroxine 75 MCG tablet Commonly known as: SYNTHROID Take 1 tablet on an empty stomach daily for 2 weeks.  Then increase to 2 tablets daily until follow-up.    lisinopril 20 MG tablet Commonly known as: ZESTRIL Take 1 tablet (20 mg total) by mouth daily.   lithium carbonate 300 MG CR tablet Commonly known as: LITHOBID TAKE 3 TABLETS (900 MG TOTAL) BY MOUTH AT BEDTIME.   mirtazapine 15 MG tablet Commonly known as: REMERON Take 1 tablet (15 mg total) by mouth at bedtime.   montelukast 10 MG tablet Commonly known as: SINGULAIR TAKE 1 TABLET BY MOUTH EVERYDAY AT BEDTIME   pravastatin 40 MG tablet Commonly known as: PRAVACHOL Take 1 tablet (40 mg total) by mouth at bedtime.   pregabalin 100 MG capsule Commonly known as: LYRICA Take 1 capsule (100 mg total) by mouth at bedtime.   rizatriptan 10 MG tablet Commonly known as: MAXALT Take 10 mg by mouth as needed for migraine (may repeat in 2 hours if needed.).   sertraline 50 MG tablet Commonly known as: ZOLOFT Take 1 tablet (50 mg total) by mouth at bedtime.   zolpidem 6.25 MG CR tablet Commonly known as: AMBIEN CR Take 1 tablet (6.25 mg total) by mouth at bedtime as needed.       History (reviewed): Past Medical History:  Diagnosis Date  . Anemia    hx of with pregnancy   . Anxiety   . Arthritis   . Asthma   . Colon polyps   . Depression   . GERD (gastroesophageal reflux disease)   . Headache    occasional headaches   . High cholesterol   . Hypertension   . PONV (postoperative nausea and vomiting)   . Shortness of breath dyspnea    due to weight gain   . Sleep apnea    has not use CPAP machine in 2 yrs/ DOES NOT KNOW IF SHE NEEDS TO USE C - PAP   Past Surgical History:  Procedure Laterality Date  . BACK SURGERY  2012   lumb fusion  . CARPAL TUNNEL RELEASE  2007   both hands  . COLONOSCOPY    . ENDOMETRIAL ABLATION W/ NOVASURE    . EXCISION METACARPAL MASS Right 03/10/2014   Procedure: EXCISION MASS RIGHT HAND FIRST WEB SPACE DORSAL PALMAR INCISION ;  Surgeon: Cindee Salt, MD;  Location: [facility];  Service: Orthopedics;  Laterality: Right;  .  IRRIGATION AND DEBRIDEMENT KNEE Left 05/23/2014   Procedure: IRRIGATION AND DEBRIDEMENT KNEE;  Surgeon: [name], MD;  Location: [facility];  Service: Orthopedics;  Laterality: Left;  With POLYETHYLENE EXCHANGE  . KNEE ARTHROSCOPY     let knee 03/2013   . KNEE ARTHROSCOPY WITH MEDIAL MENISECTOMY Right 10/22/2013   Procedure: RIGHT KNEE ARTHROSCOPY WITH MEDIAL MENISECTOMY, abrasion chrondrplasty Brigid Re OUT/CORRECT CURETTEMENT/BONE GRAFT/PROXIMAL TIBIA ;  Surgeon: Jacki Cones, MD;  Location: [facility];  Service: Orthopedics;  Laterality: Right;  . TOTAL KNEE ARTHROPLASTY Left 04/16/2014   Procedure: LEFT TOTAL KNEE ARTHROPLASTY;  Surgeon: Jacki Cones, MD;  Location: [facility];  Service: Orthopedics;  Laterality: Left;  . TOTAL KNEE ARTHROPLASTY Right 12/23/2014   Procedure: TOTAL KNEE ARTHROPLASTY;  Surgeon: Ranee Gosselin, MD;  Location: [facility];  Service: Orthopedics;  Laterality:  Right;  . trigger finger release surgeyr      bilateral   . TUBAL LIGATION     Family History  Problem Relation Age of Onset  . Alcohol abuse Mother   . Arthritis Mother   . Heart disease Mother   . Hyperlipidemia Father   . Hypertension Father   . Heart disease Father 56       CABG  . Gout Father   . Hypertension Sister   . Cancer Brother        colon, lung, liver  . Cancer Maternal Grandmother        breast  . Cancer Daughter        NEUROBLASTOMA   Social History   Socioeconomic History  . Marital status: Married    Spouse name: Trey Paula  . Number of children: 2  . Years of education: 6  . Highest education level: Some college, no degree  Occupational History    Comment: Disabled  Tobacco Use  . Smoking status: Never Smoker  . Smokeless tobacco: Never Used  Substance and Sexual Activity  . Alcohol use: No  . Drug use: No  . Sexual activity: Not on file  Other Topics Concern  . Not on file  Social History Narrative   Lives with her husband who helps with grocery shopping and other  transportation needs.  Has two children, Billey Gosling and Clydie Braun and grandchildren.  There are two outside dogs and several  Horses.    Social Determinants of Health   Financial Resource Strain: Low Risk   . Difficulty of Paying Living Expenses: Not very hard  Food Insecurity:   . Worried About Programme researcher, broadcasting/film/video in the Last Year: Not on file  . Ran Out of Food in the Last Year: Not on file  Transportation Needs: No Transportation Needs  . Lack of Transportation (Medical): No  . Lack of Transportation (Non-Medical): No  Physical Activity: Inactive  . Days of Exercise per Week: 0 days  . Minutes of Exercise per Session: 0 min  Stress:   . Feeling of Stress : Not on file  Social Connections: Unknown  . Frequency of Communication with Friends and Family: Twice a week  . Frequency of Social Gatherings with Friends and Family: Once a week  . Attends Religious Services: Not on file  . Active Member of Clubs or Organizations: Yes  . Attends Banker Meetings: More than 4 times per year  . Marital Status: Married    Activities of Daily Living In your present state of health, do you have any difficulty performing the following activities: 07/09/2019 07/09/2019  Hearing? N N  Vision? N N  Difficulty concentrating or making decisions? N -  Walking or climbing stairs? Y -  Comment - -  Dressing or bathing? N -  Doing errands, shopping? Y -  Comment - -  Preparing Food and eating ? N -  Using the Toilet? N -  In the past six months, have you accidently leaked urine? Y -  Do you have problems with loss of bowel control? N -  Managing your Medications? N -  Managing your Finances? N -  Housekeeping or managing your Housekeeping? N -  Some recent data might be hidden    Patient Education/ Literacy How often do you need to have someone help you when you read instructions, pamphlets, or other written materials from your doctor or pharmacy?: 1 - Never  Exercise Exercise limited  by: orthopedic  condition(s)(Back and knee pain)  Diet Patient reports consuming 2 meals a day and 0 snack(s) a day Patient reports that her primary diet is: Regular Patient reports that she does have regular access to food.   Depression Screen PHQ 2/9 Scores 07/09/2019 07/09/2019 07/04/2019 06/05/2019 05/07/2019 04/18/2019 04/02/2019  PHQ - 2 Score 0 0 2 0 0 0 3  PHQ- 9 Score - - 4 - - 0 11  Exception Documentation (No Data) (No Data) - - - - -     Fall Risk Fall Risk  07/09/2019 06/05/2019 05/07/2019 04/02/2019 10/29/2015  Falls in the past year? 1 0 0 0 No  Number falls in past yr: 1 - - - -  Injury with Fall? 0 - - - -  Risk for fall due to : History of fall(s) - - - -  Risk for fall due to: Comment obesity, knee and back pain - - - -     Objective:  Guss Bunde seemed alert and oriented and she participated appropriately during our telephone visit.  Blood Pressure Weight BMI  BP Readings from Last 3 Encounters:  07/04/19 118/78  06/05/19 (!) 142/81  05/07/19 126/79   Wt Readings from Last 3 Encounters:  07/04/19 (!) 310 lb (140.6 kg)  06/05/19 (!) 313 lb 6.4 oz (142.2 kg)  05/07/19 (!) 314 lb 3.2 oz (142.5 kg)   BMI Readings from Last 1 Encounters:  07/04/19 53.21 kg/m    *Unable to obtain current vital signs, weight, and BMI due to telephone visit type  Hearing/Vision  . Wiley did not seem to have difficulty with hearing/understanding during the telephone conversation . Reports that she has had a formal eye exam by an eye care professional within the past year at [place] ,[place]. . Reports that she has not had a formal hearing evaluation within the past year *Unable to fully assess hearing and vision during telephone visit type  Cognitive Function: 6CIT Screen 07/09/2019  What Year? 0 points  What month? 0 points  What time? 0 points  Count back from 20 0 points  Months in reverse 2 points  Repeat phrase 2 points  Total Score 4   (Normal:0-7, Significant  for Dysfunction: >8)  Normal Cognitive Function Screening: Yes   Immunization & Health Maintenance Record Immunization History  Administered Date(s) Administered  . DTaP 07/25/2013  . Influenza Split 05/08/2013  . Influenza, Seasonal, Injecte, Preservative Fre 07/07/2014  . Influenza,inj,Quad PF,6+ Mos 07/07/2014, 05/14/2018, 04/02/2019  . Influenza-Unspecified 02/23/2016  . Pneumococcal Conjugate-13 12/27/2010  . Pneumococcal Polysaccharide-23 05/04/2011  . Tdap 02/25/2013    Health Maintenance  Topic Date Due  . PAP SMEAR-Modifier  04/08/1984  . MAMMOGRAM  04/01/2020 (Originally 06/19/2014)  . HIV Screening  05/06/2020 (Originally 04/08/1978)  . COLONOSCOPY  09/25/2021  . TETANUS/TDAP  02/26/2023  . INFLUENZA VACCINE  Completed  . Hepatitis C Screening  Completed       Assessment  This is a routine wellness examination for LORRINDA RAMSTAD.  Health Maintenance: Due or Overdue Health Maintenance Due  Topic Date Due  . PAP SMEAR-Modifier  04/08/1984    Guss Bunde does not need a referral for Community Assistance: Care Management:   no Social Work:    no Prescription Assistance:  no Nutrition/Diabetes Education:  no   Plan:  Personalized Goals Goals Addressed            This Visit's Progress   . DIET - EAT MORE FRUITS AND VEGETABLES      .  DIET - INCREASE WATER INTAKE      . Prevent falls        Personalized Health Maintenance & Screening Recommendations  Advanced directives: has NO advanced directive  - add't info requested. Referral to SW: Advance directive material mailed to patient for review and ask to bring in to have scanned into her chart when completed.  Lung Cancer Screening Recommended: no (Low Dose CT Chest recommended if Age 80-80 years, 30 pack-year currently smoking OR have quit w/in past 15 years) Hepatitis C Screening recommended: no HIV Screening recommended: no  Advanced Directives: Written information was not prepared but was  mailed ,per patient's request.    Referrals & Orders No orders of the defined types were placed in this encounter.   Follow-up Plan . Follow-up with Mechele ClaudeStacks, Warren, MD in January,   as planned. . Schedule next year appointment for eye exam.   I have personally reviewed and noted the following in the patient's chart:   . Medical and social history . Use of alcohol, tobacco or illicit drugs  . Current medications and supplements . Functional ability and status . Nutritional status . Physical activity . Advanced directives . List of other physicians . Hospitalizations, surgeries, and ER visits in previous 12 months . Vitals . Screenings to include cognitive, depression, and falls . Referrals and appointments  In addition, I have reviewed and discussed with Morgan GaulMarie S [name] certain preventive protocols, quality metrics, and best practice recommendations. A written personalized care plan for preventive services as well as general preventive health recommendations is available and can be mailed to the patient at her request.      Tonie GriffithStone, Janie Murray  07/09/2019

## 2019-07-12 ENCOUNTER — Telehealth: Payer: Self-pay | Admitting: Family Medicine

## 2019-07-15 ENCOUNTER — Other Ambulatory Visit: Payer: Self-pay

## 2019-07-15 ENCOUNTER — Other Ambulatory Visit: Payer: Self-pay | Admitting: Family

## 2019-07-15 ENCOUNTER — Ambulatory Visit (INDEPENDENT_AMBULATORY_CARE_PROVIDER_SITE_OTHER): Payer: BC Managed Care – PPO | Admitting: Family

## 2019-07-15 ENCOUNTER — Encounter: Payer: Self-pay | Admitting: Family

## 2019-07-15 VITALS — BP 129/81 | HR 74 | Temp 98.4°F | Ht 64.0 in | Wt 305.0 lb

## 2019-07-15 DIAGNOSIS — R251 Tremor, unspecified: Secondary | ICD-10-CM

## 2019-07-15 DIAGNOSIS — Z9114 Patient's other noncompliance with medication regimen: Secondary | ICD-10-CM | POA: Diagnosis not present

## 2019-07-15 DIAGNOSIS — R531 Weakness: Secondary | ICD-10-CM

## 2019-07-15 DIAGNOSIS — R29810 Facial weakness: Secondary | ICD-10-CM | POA: Diagnosis not present

## 2019-07-15 NOTE — Telephone Encounter (Signed)
Telephone visit scheduled

## 2019-07-15 NOTE — Telephone Encounter (Signed)
Need a call back about pt med. Daughter thinks she is on wrong med and is sick. pcp is not available today.

## 2019-07-15 NOTE — Progress Notes (Signed)
Subjective:    Patient ID: [name], female    DOB: [birth year], 58 y.o.   MRN: [id number]  Chief Complaint  Patient presents with  . speech stamouring  . body tremors    HPI Pt presents to the office today with confusion, tremors,  and over sedation. Daughter is with patient and states they had a visit with her PCP a couple weeks ago and thought she was over medicated. The patient had not been taking her medication properly and missing several days and then taking too much.  Her PCP decreased her ambien 12.5 mg to 6.25 mg. Her daughter states the next day she took 4 tab of the 6.25 mg. Her family has taken her Rem[facility] rx away for the last 4 days. She has been sleeping fine.    Daughter states she is unsure why she is on the lithium. She has decreased her lithium to 300 mg from 900 mg for the last two nights. According to her daughter and from the patient she is not bipolar.   She has started jerking and can control her salvia. The patient states she needs a "bib.     Review of Systems  Constitutional: Positive for fatigue.  Neurological: Positive for tremors, facial asymmetry and weakness.  All other systems reviewed and are negative.      Objective:   Physical Exam Vitals reviewed.  Constitutional:      General: She is not in acute distress.    Appearance: She is well-developed.  HENT:     Head: Normocephalic and atraumatic.     Right Ear: Tympanic membrane normal.     Left Ear: Tympanic membrane normal.  Eyes:     Pupils:     Right eye: Pupil is sluggish.     Left eye: Pupil is sluggish.  Neck:     Thyroid: No thyromegaly.  Cardiovascular:     Rate and Rhythm: Normal rate and regular rhythm.     Heart sounds: Normal heart sounds. No murmur.  Pulmonary:     Effort: Pulmonary effort is normal. No respiratory distress.     Breath sounds: Normal breath sounds. No wheezing.  Abdominal:     General: Bowel sounds are normal. There is no distension.   Palpations: Abdomen is soft.     Tenderness: There is no abdominal tenderness.  Musculoskeletal:        General: No tenderness. Normal range of motion.     Cervical back: Normal range of motion and neck supple.  Skin:    General: Skin is warm and dry.  Neurological:     Mental Status: She is lethargic.     Cranial Nerves: No cranial nerve deficit.     Deep Tendon Reflexes: Reflexes abnormal.     Comments: Left sided weakness, facial drooping,   Psychiatric:        Behavior: Behavior normal.        Thought Content: Thought content normal.        Judgment: Judgment normal.       BP 129/81   Pulse 74   Temp 98.4 F (36.9 C) (Temporal)   Ht 5\' 4"  (1.626 m)   Wt (!) 305 lb (138.3 kg)   SpO2 96%   BMI 52.35 kg/m      Assessment & Plan:  [name] comes in today with chief complaint of speech stamouring and body tremors   Diagnosis and orders addressed:  1. Left-sided weakness  2. Facial droop  3. Weakness  4. Tremor  5. Overuse of medication   I have never seen this patient before, however, according to daughter this is not her baseline. And that 2 weeks ago she was walking fine, talking. She is very over medicated at this time. Worrisome for CVA vs lithium toxicity vs over medicated . I have stopped Ambien and lyrica today. Pt will go to ED for scan and lab work.   Evelina Dun, FNP

## 2019-07-15 NOTE — Patient Instructions (Signed)
Lithium Toxicity Lithium toxicity, which is also called lithium poisoning, is the condition of having too much lithium in the blood. Lithium is a medicine that is used to treat bipolar disorder. Lithium toxicity can be life-threatening. What are the causes? This condition is caused by:  Ingesting too much lithium, causing levels of lithium to rise in your body.  Taking lithium on a regular basis and: ? Having a condition that raises lithium levels in the body. ? Taking another medicine that raises lithium levels in the body.  Taking excess amounts of lithium in trying to take one's life (suicide attempt).  A decrease in kidney (renal) function because of dehydration, or as a side effect of another medicine. What increases the risk? This condition is more likely to develop in people:  Who are young or old.  Who have kidney disease.  Who have heart disease.  Who have dehydration that is caused by sweating or diarrhea.  Who have low sodium levels in the body. Certain medicines can increase the risk for this condition. They include:  Water pills (diuretics).  Certain medicines for high blood pressure.  Nonsteroidal anti-inflammatory drugs (NSAIDs). What are the signs or symptoms? Symptoms of mild to moderate lithium toxicity are:  Nausea and vomiting.  Diarrhea.  Drowsiness.  Thirst.  Muscle weakness.  Slurred speech.  Lack of coordination.  Frequent urination.  Being restless (agitation). The symptoms of moderate to severe lithium toxicity are:  Blurred vision.  Giddiness.  Ringing in the ears.  Severe muscle spasms.  Seizures.  Abnormal heart rhythm.  Loss of consciousness or coma. How is this diagnosed? This condition may be diagnosed based on:  Your signs and symptoms.  Your use of lithium. You will be asked about how much lithium you take, how long you have been taking it, and when you took your last dose.  Blood tests. These are done to  check the level of lithium in your blood. How is this treated? Treatment for this condition depends on how severe it is. It often involves special monitoring and hospitalization.  For mild or moderate toxicity, your dosage of lithium may be reduced or stopped.  For severe toxicity, lithium may be removed from your body. This is done in a hospital emergency department. It may involve: ? Gastric lavage. A tube is placed through your nose or mouth into your stomach. The tube is used to remove lithium that has not yet been digested. It may also be used to put medicines directly into your stomach to help stop lithium from being absorbed. ? Medicines that increase removal of lithium by your kidneys. ? Use of an artificial kidney to clean your blood (dialysis). This is usually done only in the most severe cases. Follow these instructions at home:      Take over-the-counter and prescription medicines only as told by your health care provider.  Drink enough fluid to keep your urine pale yellow.  If your toxicity was a result of an intentional overdose, work with a mental health care professional (psychiatrist or counselor). How is this prevented? Lithium toxicity is preventable. To keep it from recurring:  Take medicines only as told by your health care provider.  Drink enough fluid to keep your urine pale yellow.  Talk with your health care provider before starting a low-salt (low-sodium) diet.  Have your blood lithium levels checked regularly.  Watch for signs and symptoms of lithium toxicity. If you have signs or symptoms, get treatment early. This can   help keep severe symptoms from developing.  Always ask about the risk of interactions when starting a new medicine. Contact a health care provider if:  You have signs or symptoms of mild or moderate lithium toxicity after you receive treatment, even if your blood lithium level is normal. Get help right away if:  Your symptoms get  worse.  You have signs or symptoms of severe lithium toxicity after you receive treatment. Summary  Lithium toxicity, which is also called lithium poisoning, is the condition of having too much lithium in your blood.  This either results from taking too much lithium or from changes that cause the medicine to be eliminated from your body more slowly.  Treatment depends on the severity of the condition but often involves special monitoring and hospitalization. This information is not intended to replace advice given to you by your health care provider. Make sure you discuss any questions you have with your health care provider. Document Released: 05/08/2007 Document Revised: 11/02/2018 Document Reviewed: 07/21/2017 Elsevier Patient Education  2020 [employer]  

## 2019-07-16 DIAGNOSIS — T56891A Toxic effect of other metals, accidental (unintentional), initial encounter: Secondary | ICD-10-CM | POA: Insufficient documentation

## 2019-07-16 DIAGNOSIS — J9601 Acute respiratory failure with hypoxia: Secondary | ICD-10-CM | POA: Insufficient documentation

## 2019-07-16 DIAGNOSIS — G9341 Metabolic encephalopathy: Secondary | ICD-10-CM | POA: Insufficient documentation

## 2019-07-16 DIAGNOSIS — N179 Acute kidney failure, unspecified: Secondary | ICD-10-CM | POA: Insufficient documentation

## 2019-07-16 DIAGNOSIS — N3001 Acute cystitis with hematuria: Secondary | ICD-10-CM | POA: Insufficient documentation

## 2019-07-16 HISTORY — DX: Acute kidney failure, unspecified: N17.9

## 2019-07-17 ENCOUNTER — Other Ambulatory Visit: Payer: Self-pay | Admitting: Psychiatry

## 2019-07-17 NOTE — Telephone Encounter (Signed)
Another doctor has her on 0.3 mg

## 2019-07-26 ENCOUNTER — Other Ambulatory Visit: Payer: Self-pay | Admitting: Family Medicine

## 2019-07-26 DIAGNOSIS — F331 Major depressive disorder, recurrent, moderate: Secondary | ICD-10-CM

## 2019-07-29 ENCOUNTER — Telehealth: Payer: Self-pay | Admitting: Psychiatry

## 2019-07-29 ENCOUNTER — Ambulatory Visit (INDEPENDENT_AMBULATORY_CARE_PROVIDER_SITE_OTHER): Payer: BC Managed Care – PPO | Admitting: Family Medicine

## 2019-07-29 ENCOUNTER — Encounter: Payer: Self-pay | Admitting: Family Medicine

## 2019-07-29 DIAGNOSIS — M1711 Unilateral primary osteoarthritis, right knee: Secondary | ICD-10-CM | POA: Diagnosis not present

## 2019-07-29 DIAGNOSIS — F331 Major depressive disorder, recurrent, moderate: Secondary | ICD-10-CM | POA: Diagnosis not present

## 2019-07-29 DIAGNOSIS — M5416 Radiculopathy, lumbar region: Secondary | ICD-10-CM

## 2019-07-29 DIAGNOSIS — T56891A Toxic effect of other metals, accidental (unintentional), initial encounter: Secondary | ICD-10-CM | POA: Diagnosis not present

## 2019-07-29 MED ORDER — PREGABALIN 50 MG PO CAPS: 50.0000 mg | ORAL_CAPSULE | Freq: Every day | ORAL | 0 refills | Status: DC

## 2019-07-29 MED ORDER — CLONIDINE HCL 0.1 MG PO TABS: 0.1000 mg | ORAL_TABLET | Freq: Two times a day (BID) | ORAL | 2 refills | Status: DC

## 2019-07-29 NOTE — Telephone Encounter (Signed)
Patient has a new psychiatrist closer to where she lives.

## 2019-07-29 NOTE — Progress Notes (Signed)
Subjective:    Patient ID: [name], female    DOB: 09/22/1960, 59 y.o.   MRN: [id number]   HPI: [name] is a 59 y.o. female presenting for follow up of hospitalization at [facility]. "Not a pleasant visit." Was there for a week. Now off of lithium. Had been used for possibility of being bipolar. Pt. Denies diagnosis. Had lithium toxicity while hospitalized. Was taking duexis for knee pain. Now at 4-5/10 worse with damp weather "toothache 6-7/10. Pt. Daughter documented that the pt. Was taking up to 4 Ambien at a time in spite of the decrease in dose that had been recommended. Before hospitalization sh was drooling and acting like she was 45 years older. She was unsteady on her feet. While hospitalized lithium , Ambien and Lyrica were DC ed. Pt. Now having recurrence of her back and neck pain. However, mental status is back near baseline.  Depression screen [facility] 2/9 07/15/2019 07/09/2019 07/09/2019 07/04/2019 06/05/2019  Decreased Interest 0 0 0 1 0  Down, Depressed, Hopeless 0 0 0 1 0  PHQ - 2 Score 0 0 0 2 0  Altered sleeping - - - 0 -  Tired, decreased energy - - - 0 -  Change in appetite - - - 1 -  Feeling bad or failure about yourself  - - - 0 -  Trouble concentrating - - - 0 -  Moving slowly or fidgety/restless - - - 1 -  Suicidal thoughts - - - 0 -  PHQ-9 Score - - - 4 -  Difficult doing work/chores - - - Not difficult at all -     Relevant past medical, surgical, family and social history reviewed and updated as indicated.  Interim medical history since our last visit reviewed. Allergies and medications reviewed and updated.  ROS:  Review of Systems  Constitutional: Negative.   HENT: Negative for congestion.   Eyes: Negative for visual disturbance.  Respiratory: Negative for shortness of breath.   Cardiovascular: Negative for chest pain.  Gastrointestinal: Negative for abdominal pain, constipation, diarrhea, nausea and vomiting.  Genitourinary: Negative for  difficulty urinating.  Musculoskeletal: Positive for arthralgias, back pain and myalgias.  Neurological: Positive for headaches.  Psychiatric/Behavioral: Negative for sleep disturbance.     Social History   Tobacco Use  Smoking Status Never Smoker  Smokeless Tobacco Never Used       Objective:     Wt Readings from Last 3 Encounters:  07/15/19 (!) 305 lb (138.3 kg)  07/04/19 (!) 310 lb (140.6 kg)  06/05/19 (!) 313 lb 6.4 oz (142.2 kg)     Exam deferred. Pt. Harboring due to COVID 19. Phone visit performed.   Assessment & Plan:   1. Lithium toxicity, accidental or unintentional, initial encounter   2. Lumbar radiculopathy   3. Moderate episode of recurrent major depressive disorder ([place])   4. Primary osteoarthritis of right knee     Meds ordered this encounter  Medications  . cloNIDine (CATAPRES) 0.1 MG tablet    Sig: Take 1 tablet (0.1 mg total) by mouth 2 (two) times daily.    Dispense:  60 tablet    Refill:  2  . pregabalin (LYRICA) 50 MG capsule    Sig: Take 1 capsule (50 mg total) by mouth daily.    Dispense:  30 capsule    Refill:  0    No orders of the defined types were placed in this encounter.     Diagnoses and all  orders for this visit:  Lithium toxicity, accidental or unintentional, initial encounter  Lumbar radiculopathy  Moderate episode of recurrent major depressive disorder (HCC)  Primary osteoarthritis of right knee  Other orders -     cloNIDine (CATAPRES) 0.1 MG tablet; Take 1 tablet (0.1 mg total) by mouth 2 (two) times daily. -     pregabalin (LYRICA) 50 MG capsule; Take 1 capsule (50 mg total) by mouth daily.  Pt. Now stable on lower dose of clonidine now that she is not taking lithium and Ambien. Her mental status is much improved based on history given by her and her daughter. She is having a lot of upsurge in her chronic back pain. This is likely due to the discontinuation of duexis and of Lyrica. Although I share herr daughter's  concern about adverse reactions, it is quite likely that she will tolerate Lyrica much better when not mixed with the other agents. She needs to have her thyroid monitored closely. I have ordered TSH, Free T4. She agrees to come in for blood work in the next 1-2 days.  Virtual Visit via telephone Note  I discussed the limitations, risks, security and privacy concerns of performing an evaluation and management service by telephone and the availability of in person appointments. The patient was identified with two identifiers. Pt.expressed understanding and agreed to proceed. Pt. Is at home. Dr. Darlyn Read is in his office.  Follow Up Instructions:   I discussed the assessment and treatment plan with the patient. The patient was provided an opportunity to ask questions and all were answered. The patient agreed with the plan and demonstrated an understanding of the instructions.   The patient was advised to call back or seek an in-person evaluation if the symptoms worsen or if the condition fails to improve as anticipated.   Total minutes including chart review and phone contact time: 36   Follow up plan: Return in about 1 month (around 08/29/2019), or if symptoms worsen or fail to improve.  [name], MD [place]

## 2019-08-01 ENCOUNTER — Telehealth: Payer: Self-pay | Admitting: Family Medicine

## 2019-08-01 NOTE — Telephone Encounter (Signed)
The readings are not reliable enough to [facility] a decision. To check at home they will need to get a cuff for a large arm. As a preliminary action. Give the amlodipine 1/2 of a 10 mg tab, instead of the whole pill until a new cuff can be used and more reliable results obtained. Thanks, WS

## 2019-08-01 NOTE — Telephone Encounter (Signed)
Pt's daughter aware of all recommendations and voiced understanding.

## 2019-08-01 NOTE — Telephone Encounter (Signed)
Spoke to pt's daughter and she is concerned about pt's BP readings. Daughter states she isn't hydrating properly, is taking BP at different times of the day, not in the same position or the same arm and the cuff is too small to take BP on upper arm so pt has been taking it on the forearm and not the wrist. Advised pt's daughter to take her BP at the same time everyday in the same position as well as making sure the cuff is the accurate size or placing the cuff down on the wrist and to monitor.   Pt is sleeping a lot during the day but daughter states she isn't sleeping great at night which she was just taken off Ambien due to over use so her sleep is probably off. Discussed some sleep routines to try and help with better sleep hygiene which daughter will try.   Do you think she needs to change any of her BP meds? Does she ntbs? Any other advice?

## 2019-08-01 NOTE — Telephone Encounter (Signed)
When pts BP is checked during the day, its normal, but when checked in the mornings it is really low. Daughter says patient is very tired in the mornings and during the day, up until the evenings. Wants to know if she is taking too much BP medication. Would like to speak with Dr Darlyn Read or Nurse. BP was 89/59 yesterday morning, 103/66 yesterday afternoon, and this morning it was 123/88.

## 2019-08-05 ENCOUNTER — Telehealth: Payer: Self-pay | Admitting: *Deleted

## 2019-08-05 ENCOUNTER — Other Ambulatory Visit: Payer: Self-pay | Admitting: *Deleted

## 2019-08-05 ENCOUNTER — Other Ambulatory Visit: Payer: BC Managed Care – PPO

## 2019-08-05 DIAGNOSIS — E039 Hypothyroidism, unspecified: Secondary | ICD-10-CM

## 2019-08-05 NOTE — Telephone Encounter (Signed)
Amlodipine dose is 5mg  now but blood pressures are still too low.  Most top numbers are below 110 and bottom number stays around 50 or 60.  Please advise on next step.  Have not felt well with these lower numbers.

## 2019-08-05 NOTE — Telephone Encounter (Signed)
Patient aware to stop amlodipine , keep a record of blood pressures and pulse for reporting back.

## 2019-08-05 NOTE — Telephone Encounter (Signed)
Have pt. Discontinue the amlodipine

## 2019-08-06 LAB — THYROID PANEL WITH TSH
Free Thyroxine Index: 2.1 (ref 1.2–4.9)
T3 Uptake Ratio: 26 % (ref 24–39)
T4, Total: 8.2 ug/dL (ref 4.5–12.0)
TSH: 2.53 u[IU]/mL (ref 0.450–4.500)

## 2019-08-07 ENCOUNTER — Telehealth: Payer: Self-pay | Admitting: Family Medicine

## 2019-08-07 NOTE — Telephone Encounter (Signed)
Thyroid results are normal. This indicates the medicine is necessary. Plan on using it long  term.

## 2019-08-07 NOTE — Telephone Encounter (Signed)
Pt aware.

## 2019-08-10 NOTE — Progress Notes (Signed)
Hello [place],  Your lab result is normal and/or stable.Some minor variations that are not significant are commonly marked abnormal, but do not represent any medical problem for you.  Best regards, Arvada Seaborn, M.D.

## 2019-08-15 ENCOUNTER — Telehealth: Payer: Self-pay | Admitting: Family Medicine

## 2019-08-15 NOTE — Telephone Encounter (Signed)
Patient was seen on 12-21 by Ms Lendon Colonel. Please review note and advise on request for medication from patient.

## 2019-08-15 NOTE — Telephone Encounter (Signed)
Aware. 

## 2019-08-15 NOTE — Telephone Encounter (Signed)
We have appt. On Jan 26. We should talk about this then. Thanks, WS

## 2019-08-15 NOTE — Telephone Encounter (Signed)
Patient called stating that the sleep medication that was prescribed to her is not working. Patient says that she only gets 3-4 hrs of sleep each night. Can she be prescribed something else?

## 2019-08-19 ENCOUNTER — Other Ambulatory Visit: Payer: Self-pay

## 2019-08-20 ENCOUNTER — Ambulatory Visit (INDEPENDENT_AMBULATORY_CARE_PROVIDER_SITE_OTHER): Payer: BC Managed Care – PPO | Admitting: Family Medicine

## 2019-08-20 ENCOUNTER — Encounter: Payer: Self-pay | Admitting: Family Medicine

## 2019-08-20 VITALS — BP 131/84 | HR 97 | Temp 98.4°F | Ht 64.0 in | Wt 310.6 lb

## 2019-08-20 DIAGNOSIS — E039 Hypothyroidism, unspecified: Secondary | ICD-10-CM | POA: Diagnosis not present

## 2019-08-20 DIAGNOSIS — I10 Essential (primary) hypertension: Secondary | ICD-10-CM | POA: Diagnosis not present

## 2019-08-20 DIAGNOSIS — F5101 Primary insomnia: Secondary | ICD-10-CM | POA: Insufficient documentation

## 2019-08-20 DIAGNOSIS — N3281 Overactive bladder: Secondary | ICD-10-CM | POA: Insufficient documentation

## 2019-08-20 DIAGNOSIS — R7303 Prediabetes: Secondary | ICD-10-CM | POA: Diagnosis not present

## 2019-08-20 DIAGNOSIS — K219 Gastro-esophageal reflux disease without esophagitis: Secondary | ICD-10-CM

## 2019-08-20 HISTORY — DX: Primary insomnia: F51.01

## 2019-08-20 LAB — BAYER DCA HB A1C WAIVED: HB A1C (BAYER DCA - WAIVED): 5.5 % (ref <7.0)

## 2019-08-20 MED ORDER — SOLIFENACIN SUCCINATE 5 MG PO TABS: 5.0000 mg | ORAL_TABLET | Freq: Every day | ORAL | 5 refills | Status: DC

## 2019-08-20 MED ORDER — TRAZODONE HCL 300 MG PO TABS: 300.0000 mg | ORAL_TABLET | Freq: Every day | ORAL | 1 refills | Status: DC

## 2019-08-20 NOTE — Progress Notes (Signed)
Subjective:  Patient ID: [name], female    DOB: Aug 30, 1960  Age: 59 y.o. MRN: [id number]  CC: Follow-up (2 week thyroid recheck)   HPI [name] presents for concerns about not sleeping and follow-up on her thyroid.  She has had an underactive thyroid.  Then she was taken off her lithium because it was toxic.  Then her thyroid became euthyroid and her dose was changed.  Today we were rechecking to make sure that thyroid remains stable.  She has had no excessive sensation of hot or cold, weight change, hair loss, constipation, palpitation.   Patient states that she does not want to go back on the Ambien.  But she is up every couple of hours all night long.  Some of it is due to inability to hold her bladder.  Some of it is that her mirtazapine is just not helping her stay asleep.   Follow-up of hypertension. Patient has no history of headache chest pain or shortness of breath or recent cough. Patient also denies symptoms of TIA such as numbness weakness lateralizing. Patient checks  blood pressure at home and has not had any elevated readings recently. Patient denies side effects from his medication. States taking it regularly.  After discontinuation of the lithium, her blood pressure went much much lower.  She was taken off of the amlodipine.  Blood pressure seems to be stable now on multiple checks at home.   Depression screen [facility] 2/9 08/20/2019 07/15/2019 07/09/2019  Decreased Interest 0 0 0  Down, Depressed, Hopeless 0 0 0  PHQ - 2 Score 0 0 0  Altered sleeping - - -  Tired, decreased energy - - -  Change in appetite - - -  Feeling bad or failure about yourself  - - -  Trouble concentrating - - -  Moving slowly or fidgety/restless - - -  Suicidal thoughts - - -  PHQ-9 Score - - -  Difficult doing work/chores - - -    History Keelyn has a past medical history of Anemia, Anxiety, Arthritis, Asthma, Colon polyps, Depression, GERD (gastroesophageal reflux disease), Headache,  High cholesterol, Hypertension, PONV (postoperative nausea and vomiting), Shortness of breath dyspnea, and Sleep apnea.   She has a past surgical history that includes Endometrial ablation w/ novasure; Carpal tunnel release (2007); trigger finger release surgeyr ; Tubal ligation; Knee arthroscopy; Knee arthroscopy with medial menisectomy (Right, 10/22/2013); Back surgery (2012); Colonoscopy; Excision metacarpal mass (Right, 03/10/2014); Total knee arthroplasty (Left, 04/16/2014); Irrigation and debridement knee (Left, 05/23/2014); and Total knee arthroplasty (Right, 12/23/2014).   Her family history includes Alcohol abuse in her mother; Arthritis in her mother; Cancer in her brother, daughter, and maternal grandmother; Gout in her father; Heart disease in her mother; Heart disease (age of onset: 17) in her father; Hyperlipidemia in her father; Hypertension in her father and sister.She reports that she has never smoked. She has never used smokeless tobacco. She reports that she does not drink alcohol or use drugs.    ROS Review of Systems  Constitutional: Negative.   HENT: Negative.   Eyes: Negative for visual disturbance.  Respiratory: Negative for shortness of breath.   Cardiovascular: Negative for chest pain.  Gastrointestinal: Negative for abdominal pain.  Musculoskeletal: Negative for arthralgias.    Objective:  BP 131/84   Pulse 97   Temp 98.4 F (36.9 C) (Temporal)   Ht '5\' 4"'$  (1.626 m)   Wt (!) 310 lb 9.6 oz (140.9 kg)   BMI  53.31 kg/m   BP Readings from Last 3 Encounters:  08/20/19 131/84  07/15/19 129/81  07/04/19 118/78    Wt Readings from Last 3 Encounters:  08/20/19 (!) 310 lb 9.6 oz (140.9 kg)  07/15/19 (!) 305 lb (138.3 kg)  07/04/19 (!) 310 lb (140.6 kg)     Physical Exam Constitutional:      General: She is not in acute distress.    Appearance: She is well-developed.  HENT:     Head: Normocephalic and atraumatic.  Eyes:     Conjunctiva/sclera: Conjunctivae  normal.     Pupils: Pupils are equal, round, and reactive to light.  Neck:     Thyroid: No thyromegaly.  Cardiovascular:     Rate and Rhythm: Normal rate and regular rhythm.     Heart sounds: Normal heart sounds. No murmur.  Pulmonary:     Effort: Pulmonary effort is normal. No respiratory distress.     Breath sounds: Normal breath sounds. No wheezing or rales.  Abdominal:     General: Bowel sounds are normal. There is no distension.     Palpations: Abdomen is soft.     Tenderness: There is no abdominal tenderness.  Musculoskeletal:        General: Normal range of motion.     Cervical back: Normal range of motion and neck supple.  Lymphadenopathy:     Cervical: No cervical adenopathy.  Skin:    General: Skin is warm and dry.  Neurological:     Mental Status: She is alert and oriented to person, place, and time.  Psychiatric:        Behavior: Behavior normal.        Thought Content: Thought content normal.        Judgment: Judgment normal.    Results for orders placed or performed in visit on 08/20/19  Bayer DCA Hb A1c Waived  Result Value Ref Range   HB A1C (BAYER DCA - WAIVED) 5.5 <7.0 %      Assessment & Plan:   [name] was seen today for follow-up.  Diagnoses and all orders for this visit:  Hypothyroidism, unspecified type -     CBC with Differential/Platelet -     CMP14+EGFR -     TSH + free T4  Borderline diabetes -     CBC with Differential/Platelet -     CMP14+EGFR -     TSH + free T4 -     Bayer DCA Hb A1c Waived  Gastroesophageal reflux disease without esophagitis -     CBC with Differential/Platelet -     CMP14+EGFR -     TSH + free T4  Essential hypertension, benign -     CBC with Differential/Platelet -     CMP14+EGFR -     TSH + free T4  Primary insomnia -     CBC with Differential/Platelet -     CMP14+EGFR -     TSH + free T4 -     traZODone (DESYREL) 300 MG tablet; Take 1 tablet (300 mg total) by mouth at bedtime. Use from 1/3 to 1  tablet nightly as needed for sleep.  Overactive bladder -     solifenacin (VESICARE) 5 MG tablet; Take 1 tablet (5 mg total) by mouth daily. For bladder control  Patient has a history of prediabetes.  Partially due to obesity.  Today her A1c comes back in the normal range without even prediabetic level.  She is suffering from significant sleep issues.  She was misusing the Ambien and does not want to go back on it now that she has been weaned off.  She did not respond to the mirtazapine.  At this time I am going to use full dose of trazodone.  Her blood pressure is stable since going off of the lithium.  Her tremors have gone.  There is a family history of Parkinson's and she is very thrilled the tremor is not an indication that she is developing back condition.  I believe her thyroid is going to be euthyroid based on her evaluation today.  However, based on the changes in her regimen with the lithium and the thyroid itself combined with abnormal thyroid labs recently I think 1 more test to make sure where you thyroid is in order.     I have discontinued Blanchie Zeleznik. Wrenn's mirtazapine. I am also having her start on trazodone and solifenacin. Additionally, I am having her maintain her rizatriptan, furosemide, lisinopril, pravastatin, montelukast, levothyroxine, albuterol, Breo Ellipta, sertraline, cloNIDine, and pregabalin.  Allergies as of 08/20/2019      Reactions   Adhesive [tape] Other (See Comments)   Peeled off skin   Ceclor [cefaclor] Other (See Comments)   Blisters on hands   Chocolate Other (See Comments)   migraines   Venlafaxine Other (See Comments)   "refuses to take - makes me feel like I am dying"      Medication List       Accurate as of August 20, 2019  6:17 PM. If you have any questions, ask your nurse or doctor.        STOP taking these medications   mirtazapine 15 MG tablet Commonly known as: REMERON Stopped by: [name], MD     TAKE these medications     albuterol 108 (90 Base) MCG/ACT inhaler Commonly known as: VENTOLIN HFA Inhale 2 puffs into the lungs every 6 (six) hours as needed for wheezing or shortness of breath.   Breo Ellipta 200-25 MCG/INH Aepb Generic drug: fluticasone furoate-vilanterol Inhale 1 puff into the lungs daily.   cloNIDine 0.1 MG tablet Commonly known as: CATAPRES Take 1 tablet (0.1 mg total) by mouth 2 (two) times daily.   furosemide 20 MG tablet Commonly known as: LASIX Take 1 tablet (20 mg total) by mouth daily.   levothyroxine 75 MCG tablet Commonly known as: SYNTHROID Take 1 tablet on an empty stomach daily for 2 weeks.  Then increase to 2 tablets daily until follow-up.   lisinopril 20 MG tablet Commonly known as: ZESTRIL Take 1 tablet (20 mg total) by mouth daily.   montelukast 10 MG tablet Commonly known as: SINGULAIR TAKE 1 TABLET BY MOUTH EVERYDAY AT BEDTIME   pravastatin 40 MG tablet Commonly known as: PRAVACHOL Take 1 tablet (40 mg total) by mouth at bedtime.   pregabalin 50 MG capsule Commonly known as: Lyrica Take 1 capsule (50 mg total) by mouth daily.   rizatriptan 10 MG tablet Commonly known as: MAXALT Take 10 mg by mouth as needed for migraine (may repeat in 2 hours if needed.).   sertraline 50 MG tablet Commonly known as: ZOLOFT TAKE 1 TABLET BY MOUTH EVERYDAY AT BEDTIME   solifenacin 5 MG tablet Commonly known as: VESIcare Take 1 tablet (5 mg total) by mouth daily. For bladder control Started by: [name], MD   trazodone 300 MG tablet Commonly known as: DESYREL Take 1 tablet (300 mg total) by mouth at bedtime. Use from 1/3 to 1 tablet nightly  as needed for sleep. Started by: [name], MD        Follow-up: Return in about 1 month (around 09/20/2019).  [name], M.D.

## 2019-08-21 LAB — CMP14+EGFR
ALT: 39 IU/L — ABNORMAL HIGH (ref 0–32)
AST: 42 IU/L — ABNORMAL HIGH (ref 0–40)
Albumin/Globulin Ratio: 1.8 (ref 1.2–2.2)
Albumin: 4.6 g/dL (ref 3.8–4.9)
Alkaline Phosphatase: 130 IU/L — ABNORMAL HIGH (ref 39–117)
BUN/Creatinine Ratio: 15 (ref 9–23)
BUN: 13 mg/dL (ref 6–24)
Bilirubin Total: 0.3 mg/dL (ref 0.0–1.2)
CO2: 20 mmol/L (ref 20–29)
Calcium: 9.7 mg/dL (ref 8.7–10.2)
Chloride: 101 mmol/L (ref 96–106)
Creatinine, Ser: 0.86 mg/dL (ref 0.57–1.00)
GFR calc Af Amer: 87 mL/min/{1.73_m2} (ref >59)
GFR calc non Af Amer: 76 mL/min/{1.73_m2} (ref >59)
Globulin, Total: 2.6 g/dL (ref 1.5–4.5)
Glucose: 109 mg/dL — ABNORMAL HIGH (ref 65–99)
Potassium: 4.3 mmol/L (ref 3.5–5.2)
Sodium: 139 mmol/L (ref 134–144)
Total Protein: 7.2 g/dL (ref 6.0–8.5)

## 2019-08-21 LAB — CBC WITH DIFFERENTIAL/PLATELET
Basophils Absolute: 0.1 10*3/uL (ref 0.0–0.2)
Basos: 1 %
EOS (ABSOLUTE): 0.2 10*3/uL (ref 0.0–0.4)
Eos: 2 %
Hematocrit: 39.1 % (ref 34.0–46.6)
Hemoglobin: 13.2 g/dL (ref 11.1–15.9)
Immature Grans (Abs): 0 10*3/uL (ref 0.0–0.1)
Immature Granulocytes: 0 %
Lymphocytes Absolute: 2 10*3/uL (ref 0.7–3.1)
Lymphs: 21 %
MCH: 30.9 pg (ref 26.6–33.0)
MCHC: 33.8 g/dL (ref 31.5–35.7)
MCV: 92 fL (ref 79–97)
Monocytes Absolute: 0.6 10*3/uL (ref 0.1–0.9)
Monocytes: 7 %
Neutrophils Absolute: 6.7 10*3/uL (ref 1.4–7.0)
Neutrophils: 69 %
Platelets: 276 10*3/uL (ref 150–450)
RBC: 4.27 x10E6/uL (ref 3.77–5.28)
RDW: 14.2 % (ref 11.7–15.4)
WBC: 9.6 10*3/uL (ref 3.4–10.8)

## 2019-08-21 LAB — TSH+FREE T4
Free T4: 1.03 ng/dL (ref 0.82–1.77)
TSH: 1.91 u[IU]/mL (ref 0.450–4.500)

## 2019-08-21 NOTE — Progress Notes (Signed)
Hello Loucinda,  Your lab result is normal and/or stable.Some minor variations that are not significant are commonly marked abnormal, but do not represent any medical problem for you.  Best regards, Luvena Wentling, M.D.

## 2019-08-24 ENCOUNTER — Other Ambulatory Visit: Payer: Self-pay | Admitting: Psychiatry

## 2019-08-26 ENCOUNTER — Telehealth: Payer: Self-pay | Admitting: Family Medicine

## 2019-08-26 NOTE — Telephone Encounter (Signed)
Due for f/u

## 2019-08-26 NOTE — Telephone Encounter (Signed)
Patient aware of lab results.

## 2019-08-27 NOTE — Telephone Encounter (Signed)
Looks like Dr. Darlyn Read has her 1 tablet bid now. She was in hospital in Dec for lithium toxicity, no follow up since 04/2019

## 2019-08-27 NOTE — Telephone Encounter (Signed)
Call to RS

## 2019-08-28 ENCOUNTER — Other Ambulatory Visit: Payer: Self-pay | Admitting: Family Medicine

## 2019-08-28 NOTE — Telephone Encounter (Signed)
What is the name of the medication? ambien 12.5 extended release  Have you contacted your pharmacy to request a refill? no  Which pharmacy would you like this sent to? [facility]   Patient notified that their request is being sent to the clinical staff for review and that they should receive a call once it is complete. If they do not receive a call within 24 hours they can check with their pharmacy or our office.

## 2019-08-28 NOTE — Telephone Encounter (Signed)
Spoke with patient.  She states that vitamin B will be cheaper if it is sent as a prescription and would like it sent.   Pt continues to take Trazodone but it does not help with her sleep. She took Ambien in the past and it was helpful. Pt had to d/c the Ambien in December because [facility] was unsure if it or the Lithium that she was on at the time was causing problems with her kidneys.  Pt is scheduled for a follow up 09/05/19

## 2019-08-28 NOTE — Telephone Encounter (Signed)
Please review for patient. Do not see documentation where this is a current medication.

## 2019-08-28 NOTE — Telephone Encounter (Signed)
I will not refill the ambien. We can review at her follow up. I will go ahead with the thiamine

## 2019-08-29 DIAGNOSIS — M25562 Pain in left knee: Secondary | ICD-10-CM | POA: Insufficient documentation

## 2019-08-29 NOTE — Telephone Encounter (Signed)
Patient aware and verbalized understanding. °

## 2019-09-02 ENCOUNTER — Ambulatory Visit: Payer: BC Managed Care – PPO | Admitting: Family Medicine

## 2019-09-02 ENCOUNTER — Other Ambulatory Visit: Payer: Self-pay

## 2019-09-02 ENCOUNTER — Other Ambulatory Visit: Payer: BC Managed Care – PPO

## 2019-09-03 ENCOUNTER — Encounter: Payer: Self-pay | Admitting: Family Medicine

## 2019-09-03 ENCOUNTER — Ambulatory Visit (INDEPENDENT_AMBULATORY_CARE_PROVIDER_SITE_OTHER): Payer: BC Managed Care – PPO | Admitting: Family Medicine

## 2019-09-03 VITALS — BP 134/87 | HR 94 | Temp 98.0°F | Ht 64.0 in | Wt 312.4 lb

## 2019-09-03 DIAGNOSIS — J4541 Moderate persistent asthma with (acute) exacerbation: Secondary | ICD-10-CM

## 2019-09-03 DIAGNOSIS — F5101 Primary insomnia: Secondary | ICD-10-CM | POA: Diagnosis not present

## 2019-09-03 DIAGNOSIS — F331 Major depressive disorder, recurrent, moderate: Secondary | ICD-10-CM

## 2019-09-03 DIAGNOSIS — I10 Essential (primary) hypertension: Secondary | ICD-10-CM

## 2019-09-03 DIAGNOSIS — E039 Hypothyroidism, unspecified: Secondary | ICD-10-CM

## 2019-09-03 MED ORDER — SERTRALINE HCL 50 MG PO TABS: ORAL_TABLET | ORAL | 0 refills | Status: DC

## 2019-09-03 MED ORDER — LEVOTHYROXINE SODIUM 100 MCG PO TABS: 100.0000 ug | ORAL_TABLET | Freq: Every day | ORAL | 1 refills | Status: DC

## 2019-09-03 MED ORDER — TRAZODONE HCL 300 MG PO TABS: 300.0000 mg | ORAL_TABLET | Freq: Every day | ORAL | 1 refills | Status: DC

## 2019-09-03 MED ORDER — FUROSEMIDE 20 MG PO TABS: 20.0000 mg | ORAL_TABLET | ORAL | 1 refills | Status: DC

## 2019-09-03 MED ORDER — BREO ELLIPTA 200-25 MCG/INH IN AEPB: 1.0000 | INHALATION_SPRAY | RESPIRATORY_TRACT | 11 refills | Status: DC

## 2019-09-03 NOTE — Patient Instructions (Signed)
Trazodone is not habit forming. Please take the entire dose every night oone hour before bedtime  Insomnia Insomnia is a sleep disorder that makes it difficult to fall asleep or stay asleep. Insomnia can cause fatigue, low energy, difficulty concentrating, mood swings, and poor performance at work or school. There are three different ways to classify insomnia:  Difficulty falling asleep.  Difficulty staying asleep.  Waking up too early in the morning. Any type of insomnia can be long-term (chronic) or short-term (acute). Both are common. Short-term insomnia usually lasts for three months or less. Chronic insomnia occurs at least three times a week for longer than three months. What are the causes? Insomnia may be caused by another condition, situation, or substance, such as:  Anxiety.  Certain medicines.  Gastroesophageal reflux disease (GERD) or other gastrointestinal conditions.  Asthma or other breathing conditions.  Restless legs syndrome, sleep apnea, or other sleep disorders.  Chronic pain.  Menopause.  Stroke.  Abuse of alcohol, tobacco, or illegal drugs.  Mental health conditions, such as depression.  Caffeine.  Neurological disorders, such as Alzheimer's disease.  An overactive thyroid (hyperthyroidism). Sometimes, the cause of insomnia may not be known. What increases the risk? Risk factors for insomnia include:  Gender. Women are affected more often than men.  Age. Insomnia is more common as you get older.  Stress.  Lack of exercise.  Irregular work schedule or working night shifts.  Traveling between different time zones.  Certain medical and mental health conditions. What are the signs or symptoms? If you have insomnia, the main symptom is having trouble falling asleep or having trouble staying asleep. This may lead to other symptoms, such as:  Feeling fatigued or having low energy.  Feeling nervous about going to sleep.  Not feeling rested  in the morning.  Having trouble concentrating.  Feeling irritable, anxious, or depressed. How is this diagnosed? This condition may be diagnosed based on:  Your symptoms and medical history. Your health care provider may ask about: ? Your sleep habits. ? Any medical conditions you have. ? Your mental health.  A physical exam. How is this treated? Treatment for insomnia depends on the cause. Treatment may focus on treating an underlying condition that is causing insomnia. Treatment may also include:  Medicines to help you sleep.  Counseling or therapy.  Lifestyle adjustments to help you sleep better. Follow these instructions at home: Eating and drinking   Limit or avoid alcohol, caffeinated beverages, and cigarettes, especially close to bedtime. These can disrupt your sleep.  Do not eat a large meal or eat spicy foods right before bedtime. This can lead to digestive discomfort that can make it hard for you to sleep. Sleep habits   Keep a sleep diary to help you and your health care provider figure out what could be causing your insomnia. Write down: ? When you sleep. ? When you wake up during the night. ? How well you sleep. ? How rested you feel the next day. ? Any side effects of medicines you are taking. ? What you eat and drink.  Make your bedroom a dark, comfortable place where it is easy to fall asleep. ? Put up shades or blackout curtains to block light from outside. ? Use a white noise machine to block noise. ? Keep the temperature cool.  Limit screen use before bedtime. This includes: ? Watching TV. ? Using your smartphone, tablet, or computer.  Stick to a routine that includes going to bed and waking  up at the same times every day and night. This can help you fall asleep faster. Consider making a quiet activity, such as reading, part of your nighttime routine.  Try to avoid taking naps during the day so that you sleep better at night.  Get out of bed if  you are still awake after 15 minutes of trying to sleep. Keep the lights down, but try reading or doing a quiet activity. When you feel sleepy, go back to bed. General instructions  Take over-the-counter and prescription medicines only as told by your health care provider.  Exercise regularly, as told by your health care provider. Avoid exercise starting several hours before bedtime.  Use relaxation techniques to manage stress. Ask your health care provider to suggest some techniques that may work well for you. These may include: ? Breathing exercises. ? Routines to release muscle tension. ? Visualizing peaceful scenes.  Make sure that you drive carefully. Avoid driving if you feel very sleepy.  Keep all follow-up visits as told by your health care provider. This is important. Contact a health care provider if:  You are tired throughout the day.  You have trouble in your daily routine due to sleepiness.  You continue to have sleep problems, or your sleep problems get worse. Get help right away if:  You have serious thoughts about hurting yourself or someone else. If you ever feel like you may hurt yourself or others, or have thoughts about taking your own life, get help right away. You can go to your nearest emergency department or call:  Your local emergency services (911 in the [place]  A suicide crisis helpline, such as the [place] at 510-389-3794. This is open 24 hours a day. Summary  Insomnia is a sleep disorder that makes it difficult to fall asleep or stay asleep.  Insomnia can be long-term (chronic) or short-term (acute).  Treatment for insomnia depends on the cause. Treatment may focus on treating an underlying condition that is causing insomnia.  Keep a sleep diary to help you and your health care provider figure out what could be causing your insomnia. This information is not intended to replace advice given to you by your health care  provider. Make sure you discuss any questions you have with your health care provider. Document Revised: 06/23/2017 Document Reviewed: 04/20/2017 Elsevier Patient Education  2020 [employer].

## 2019-09-03 NOTE — Patient Instructions (Addendum)
DUE TO COVID-19 ONLY ONE VISITOR IS ALLOWED TO COME WITH YOU AND STAY IN THE WAITING ROOM ONLY DURING PRE OP AND PROCEDURE DAY OF SURGERY. THE 1 VISITOR MAY VISIT WITH YOU AFTER SURGERY IN YOUR PRIVATE ROOM DURING VISITING HOURS ONLY!   YOU NEED TO HAVE A COVID 19 TEST ON__2/22/2021_____ @___10 :00AM____, THIS TEST MUST BE DONE BEFORE SURGERY, COME  [place], [place] , [address].  ([place]) ONCE YOUR COVID TEST IS COMPLETED, PLEASE BEGIN THE QUARANTINE INSTRUCTIONS AS OUTLINED IN YOUR HANDOUT.                [name]     Your procedure is scheduled on: 09/19/2019   Report to [facility] Main  Entrance    Report to admitting at 9:00AM     Call this number if you have problems the morning of surgery [phone]    Remember: NO SOLID FOOD AFTER MIDNIGHT THE NIGHT PRIOR TO SURGERY. YOU MAY DRINK CLEAR LIQUIDS.   STOP CLEAR LIQUIDS AT ___8:30AM______ AND THEN DRINK THE ENSURE PRE-SURGERY [place]. NOTHING BY MOUTH AFTER THE ENSURE DRINK!    CLEAR LIQUID DIET   Foods Allowed                                                                     Foods Excluded  Coffee and tea, regular and decaf                             liquids that you cannot  Plain Jell-O any favor except red or purple                                           see through such as: Fruit ices (not with fruit pulp)                                     milk, soups, orange juice  Iced Popsicles                                    All solid food Carbonated beverages, regular and diet                                    Cranberry, grape and apple juices Sports drinks like Gatorade Lightly seasoned clear broth or consume(fat free) Sugar, honey syrup  Sample Menu Breakfast                                Lunch                                     Supper Cranberry juice  Beef broth                            Chicken broth Jell-O                                     Grape  juice                           Apple juice Coffee or tea                        Jell-O                                      Popsicle                                                Coffee or tea                        Coffee or tea  _____________________________________________________________________     BRUSH YOUR TEETH MORNING OF SURGERY AND RINSE YOUR MOUTH OUT, NO CHEWING GUM CANDY OR MINTS.      Take these medicines the morning of surgery with A SIP OF WATER: clonidine, levothyroxine, rizatriptan if needed, ventolin inhaler if needed, Breo Ellipta inhaler                                  You may not have any metal on your body including hair pins and              piercings  Do not wear jewelry, make-up, lotions, powders or perfumes, deodorant             Do not wear nail polish on your fingernails.  Do not shave  48 hours prior to surgery.         Do not bring valuables to the hospital. [facility] IS NOT             RESPONSIBLE   FOR VALUABLES.  Contacts, dentures or bridgework may not be worn into surgery.  YOU MAY BRING A SMALL OVERNIGHT BAG               Please read over the following fact sheets you were given: _____________________________________________________________________            [facility] - Preparing for Surgery Before surgery, you can play an important role.  Because skin is not sterile, your skin needs to be as free of germs as possible.  You can reduce the number of germs on your skin by washing with CHG (chlorahexidine gluconate) soap before surgery.  CHG is an antiseptic cleaner which kills germs and bonds with the skin to continue killing germs even after washing. Please DO NOT use if you have an allergy to CHG or antibacterial soaps.  If your skin becomes reddened/irritated stop using the CHG and inform your nurse when you arrive at Short Stay. Do not shave (including legs and underarms) for at least  48 hours prior to the first CHG shower.  You may  shave your face/neck. Please follow these instructions carefully:  1.  Shower with CHG Soap the night before surgery and the  morning of Surgery.  2.  If you choose to wash your hair, wash your hair first as usual with your  normal  shampoo.  3.  After you shampoo, rinse your hair and body thoroughly to remove the  shampoo.                           4.  Use CHG as you would any other liquid soap.  You can apply chg directly  to the skin and wash                       Gently with a scrungie or clean washcloth.  5.  Apply the CHG Soap to your body ONLY FROM THE NECK DOWN.   Do not use on face/ open                           Wound or open sores. Avoid contact with eyes, ears mouth and genitals (private parts).                       Wash face,  Genitals (private parts) with your normal soap.             6.  Wash thoroughly, paying special attention to the area where your surgery  will be performed.  7.  Thoroughly rinse your body with warm water from the neck down.  8.  DO NOT shower/wash with your normal soap after using and rinsing off  the CHG Soap.                9.  Pat yourself dry with a clean towel.            10.  Wear clean pajamas.            11.  Place clean sheets on your bed the night of your first shower and do not  sleep with pets. Day of Surgery : Do not apply any lotions/deodorants the morning of surgery.  Please wear clean clothes to the hospital/surgery center.  FAILURE TO FOLLOW THESE INSTRUCTIONS MAY RESULT IN THE CANCELLATION OF YOUR SURGERY PATIENT SIGNATURE_________________________________  NURSE SIGNATURE__________________________________  ________________________________________________________________________    Rogelia Mire  An incentive spirometer is a tool that can help keep your lungs clear and active. This tool measures how well you are filling your lungs with each breath. Taking long deep breaths may help reverse or decrease the chance of developing  breathing (pulmonary) problems (especially infection) following:  A long period of time when you are unable to move or be active. BEFORE THE PROCEDURE   If the spirometer includes an indicator to show your best effort, your nurse or respiratory therapist will set it to a desired goal.  If possible, sit up straight or lean slightly forward. Try not to slouch.  Hold the incentive spirometer in an upright position. INSTRUCTIONS FOR USE  1. Sit on the edge of your bed if possible, or sit up as far as you can in bed or on a chair. 2. Hold the incentive spirometer in an upright position. 3. Breathe out normally. 4. Place the mouthpiece in your mouth and seal your lips tightly around  it. 5. Breathe in slowly and as deeply as possible, raising the piston or the ball toward the top of the column. 6. Hold your breath for 3-5 seconds or for as long as possible. Allow the piston or ball to fall to the bottom of the column. 7. Remove the mouthpiece from your mouth and breathe out normally. 8. Rest for a few seconds and repeat Steps 1 through 7 at least 10 times every 1-2 hours when you are awake. Take your time and take a few normal breaths between deep breaths. 9. The spirometer may include an indicator to show your best effort. Use the indicator as a goal to work toward during each repetition. 10. After each set of 10 deep breaths, practice coughing to be sure your lungs are clear. If you have an incision (the cut made at the time of surgery), support your incision when coughing by placing a pillow or rolled up towels firmly against it. Once you are able to get out of bed, walk around indoors and cough well. You may stop using the incentive spirometer when instructed by your caregiver.  RISKS AND COMPLICATIONS  Take your time so you do not get dizzy or light-headed.  If you are in pain, you may need to take or ask for pain medication before doing incentive spirometry. It is harder to take a deep  breath if you are having pain. AFTER USE  Rest and breathe slowly and easily.  It can be helpful to keep track of a log of your progress. Your caregiver can provide you with a simple table to help with this. If you are using the spirometer at home, follow these instructions: SEEK MEDICAL CARE IF:   You are having difficultly using the spirometer.  You have trouble using the spirometer as often as instructed.  Your pain medication is not giving enough relief while using the spirometer.  You develop fever of 100.5 F (38.1 C) or higher. SEEK IMMEDIATE MEDICAL CARE IF:   You cough up bloody sputum that had not been present before.  You develop fever of 102 F (38.9 C) or greater.  You develop worsening pain at or near the incision site. MAKE SURE YOU:   Understand these instructions.  Will watch your condition.  Will get help right away if you are not doing well or get worse. Document Released: 11/21/2006 Document Revised: 10/03/2011 Document Reviewed: 01/22/2007 [employer] Patient Information 2014 [employer], [place].   ________________________________________________________________________  WHAT IS A BLOOD TRANSFUSION? Blood Transfusion Information  A transfusion is the replacement of blood or some of its parts. Blood is made up of multiple cells which provide different functions.  Red blood cells carry oxygen and are used for blood loss replacement.  White blood cells fight against infection.  Platelets control bleeding.  Plasma helps clot blood.  Other blood products are available for specialized needs, such as hemophilia or other clotting disorders. BEFORE THE TRANSFUSION  Who gives blood for transfusions?   Healthy volunteers who are fully evaluated to make sure their blood is safe. This is blood bank blood. Transfusion therapy is the safest it has ever been in the practice of medicine. Before blood is taken from a donor, a complete history is taken to make sure  that person has no history of diseases nor engages in risky social behavior (examples are intravenous drug use or sexual activity with multiple partners). The donor's travel history is screened to minimize risk of transmitting infections, such as malaria. The donated  blood is tested for signs of infectious diseases, such as HIV and hepatitis. The blood is then tested to be sure it is compatible with you in order to minimize the chance of a transfusion reaction. If you or a relative donates blood, this is often done in anticipation of surgery and is not appropriate for emergency situations. It takes many days to process the donated blood. RISKS AND COMPLICATIONS Although transfusion therapy is very safe and saves many lives, the main dangers of transfusion include:   Getting an infectious disease.  Developing a transfusion reaction. This is an allergic reaction to something in the blood you were given. Every precaution is taken to prevent this. The decision to have a blood transfusion has been considered carefully by your caregiver before blood is given. Blood is not given unless the benefits outweigh the risks. AFTER THE TRANSFUSION  Right after receiving a blood transfusion, you will usually feel much better and more energetic. This is especially true if your red blood cells have gotten low (anemic). The transfusion raises the level of the red blood cells which carry oxygen, and this usually causes an energy increase.  The nurse administering the transfusion will monitor you carefully for complications. HOME CARE INSTRUCTIONS  No special instructions are needed after a transfusion. You may find your energy is better. Speak with your caregiver about any limitations on activity for underlying diseases you may have. SEEK MEDICAL CARE IF:   Your condition is not improving after your transfusion.  You develop redness or irritation at the intravenous (IV) site. SEEK IMMEDIATE MEDICAL CARE IF:  Any of  the following symptoms occur over the next 12 hours:  Shaking chills.  You have a temperature by mouth above 102 F (38.9 C), not controlled by medicine.  Chest, back, or muscle pain.  People around you feel you are not acting correctly or are confused.  Shortness of breath or difficulty breathing.  Dizziness and fainting.  You get a rash or develop hives.  You have a decrease in urine output.  Your urine turns a dark color or changes to pink, red, or brown. Any of the following symptoms occur over the next 10 days:  You have a temperature by mouth above 102 F (38.9 C), not controlled by medicine.  Shortness of breath.  Weakness after normal activity.  The white part of the eye turns yellow (jaundice).  You have a decrease in the amount of urine or are urinating less often.  Your urine turns a dark color or changes to pink, red, or brown. Document Released: 07/08/2000 Document Revised: 10/03/2011 Document Reviewed: 02/25/2008 [facility] Patient Information 2014 [place], [place].  _______________________________________________________________________

## 2019-09-03 NOTE — Progress Notes (Signed)
Subjective:  Patient ID: [name], female    DOB: 29-Dec-1960  Age: 59 y.o. MRN: [id number]  CC: Follow-up (3 month)   HPI [name] presents for  follow-up of hypertension. Patient has no history of headache chest pain or shortness of breath or recent cough. Patient also denies symptoms of TIA such as focal numbness or weakness. Patient denies side effects from medication. States taking it regularly. She is still not getting adequate sleep at night.  She is taking the trazodone at suppertime.  She is not getting to sleep until 1 AM.  Her usual bedtime is about 11.  Sometimes she will lay down at 930 and much I love Lucy episodes until 11 before she turns the TV off.  She might get to sleep in just a short time but wake up in a couple of hours.  She will wake up during the night having had a bad dream go to the bathroom and come back and pick up the same bad dream.  She is concerned that the trazodone may be causing the bad dreams.  Of note is that she was unclear with regard to use of the Lasix as her fluid pill versus the Vesicare as her bladder control pill she did not realize she could use them both.  So she use the Vesicare once and then went back to the Lasix because she tends to swell.   History [name] has a past medical history of Anemia, Anxiety, Arthritis, Asthma, Colon polyps, Depression, GERD (gastroesophageal reflux disease), Headache, High cholesterol, Hypertension, PONV (postoperative nausea and vomiting), Shortness of breath dyspnea, and Sleep apnea.   She has a past surgical history that includes Endometrial ablation w/ novasure; Carpal tunnel release (2007); trigger finger release surgeyr ; Tubal ligation; Knee arthroscopy; Knee arthroscopy with medial menisectomy (Right, 10/22/2013); Back surgery (2012); Colonoscopy; Excision metacarpal mass (Right, 03/10/2014); Total knee arthroplasty (Left, 04/16/2014); Irrigation and debridement knee (Left, 05/23/2014); and Total knee  arthroplasty (Right, 12/23/2014).   Her family history includes Alcohol abuse in her mother; Arthritis in her mother; Cancer in her brother, daughter, and maternal grandmother; Gout in her father; Heart disease in her mother; Heart disease (age of onset: 23) in her father; Hyperlipidemia in her father; Hypertension in her father and sister.She reports that she has never smoked. She has never used smokeless tobacco. She reports that she does not drink alcohol or use drugs.  Current Outpatient Medications on File Prior to Visit  Medication Sig Dispense Refill  . albuterol (VENTOLIN HFA) 108 (90 Base) MCG/ACT inhaler Inhale 2 puffs into the lungs every 6 (six) hours as needed for wheezing or shortness of breath. 18 g 5  . cloNIDine (CATAPRES) 0.1 MG tablet Take 1 tablet (0.1 mg total) by mouth 2 (two) times daily. 60 tablet 2  . lisinopril (ZESTRIL) 20 MG tablet Take 1 tablet (20 mg total) by mouth daily. 90 tablet 1  . montelukast (SINGULAIR) 10 MG tablet TAKE 1 TABLET BY MOUTH EVERYDAY AT BEDTIME (Patient taking differently: Take 10 mg by mouth at bedtime. ) 90 tablet 1  . pravastatin (PRAVACHOL) 40 MG tablet Take 1 tablet (40 mg total) by mouth at bedtime. 90 tablet 1  . rizatriptan (MAXALT) 10 MG tablet Take 10 mg by mouth as needed for migraine (may repeat in 2 hours if needed).     . solifenacin (VESICARE) 5 MG tablet Take 1 tablet (5 mg total) by mouth daily. For bladder control (Patient taking differently: Take  5 mg by mouth daily as needed (bladder control). ) 30 tablet 5   No current facility-administered medications on file prior to visit.    ROS Review of Systems  Constitutional: Negative.   HENT: Negative.   Eyes: Negative for visual disturbance.  Respiratory: Negative for shortness of breath.   Cardiovascular: Negative for chest pain.  Gastrointestinal: Negative for abdominal pain.  Musculoskeletal: Negative for arthralgias.    Objective:  BP 134/87   Pulse 94   Temp 98 F  (36.7 C) (Temporal)   Ht 5\' 4"  (1.626 m)   Wt (!) 312 lb 6.4 oz (141.7 kg)   BMI 53.62 kg/m   BP Readings from Last 3 Encounters:  09/03/19 134/87  08/20/19 131/84  07/15/19 129/81    Wt Readings from Last 3 Encounters:  09/03/19 (!) 312 lb 6.4 oz (141.7 kg)  08/20/19 (!) 310 lb 9.6 oz (140.9 kg)  07/15/19 (!) 305 lb (138.3 kg)     Physical Exam Constitutional:      General: She is not in acute distress.    Appearance: She is well-developed.  HENT:     Head: Normocephalic and atraumatic.  Eyes:     Conjunctiva/sclera: Conjunctivae normal.     Pupils: Pupils are equal, round, and reactive to light.  Neck:     Thyroid: No thyromegaly.  Cardiovascular:     Rate and Rhythm: Normal rate and regular rhythm.     Heart sounds: Normal heart sounds. No murmur.  Pulmonary:     Effort: Pulmonary effort is normal. No respiratory distress.     Breath sounds: Normal breath sounds. No wheezing or rales.  Abdominal:     General: Bowel sounds are normal. There is no distension.     Palpations: Abdomen is soft.     Tenderness: There is no abdominal tenderness.  Musculoskeletal:        General: Normal range of motion.     Cervical back: Normal range of motion and neck supple.  Lymphadenopathy:     Cervical: No cervical adenopathy.  Skin:    General: Skin is warm and dry.  Neurological:     Mental Status: She is alert and oriented to person, place, and time.  Psychiatric:        Behavior: Behavior normal.        Thought Content: Thought content normal.        Judgment: Judgment normal.       Assessment & Plan:   [name] was seen today for follow-up.  Diagnoses and all orders for this visit:  Hypothyroidism, unspecified type -     levothyroxine (SYNTHROID) 100 MCG tablet; Take 1 tablet (100 mcg total) by mouth daily before breakfast.  Primary insomnia -     trazodone (DESYREL) 300 MG tablet; Take 1 tablet (300 mg total) by mouth at bedtime.  Moderate persistent asthma  with acute exacerbation -     fluticasone furoate-vilanterol (BREO ELLIPTA) 200-25 MCG/INH AEPB; Inhale 1 puff into the lungs every morning.  Essential hypertension, benign -     furosemide (LASIX) 20 MG tablet; Take 1 tablet (20 mg total) by mouth every morning.  Moderate episode of recurrent major depressive disorder (HCC) -     sertraline (ZOLOFT) 50 MG tablet; TAKE 1 TABLET BY MOUTH EVERYDAY AT BEDTIME   Allergies as of 09/03/2019      Reactions   Adhesive [tape] Other (See Comments)   Peeled off skin   Ceclor [cefaclor] Other (See Comments)  Blisters on hands   Chocolate Other (See Comments)   migraines   Venlafaxine Other (See Comments)   "refuses to take - makes me feel like I am dying"      Medication List       Accurate as of September 03, 2019 10:51 PM. If you have any questions, ask your nurse or doctor.        STOP taking these medications   pregabalin 50 MG capsule Commonly known as: Lyrica Stopped by: [name], MD     TAKE these medications   albuterol 108 (90 Base) MCG/ACT inhaler Commonly known as: VENTOLIN HFA Inhale 2 puffs into the lungs every 6 (six) hours as needed for wheezing or shortness of breath.   Breo Ellipta 200-25 MCG/INH Aepb Generic drug: fluticasone furoate-vilanterol Inhale 1 puff into the lungs every morning. What changed: when to take this Changed by: [name], MD   cloNIDine 0.1 MG tablet Commonly known as: CATAPRES Take 1 tablet (0.1 mg total) by mouth 2 (two) times daily.   furosemide 20 MG tablet Commonly known as: LASIX Take 1 tablet (20 mg total) by mouth every morning. What changed: when to take this Changed by: [name], MD   levothyroxine 100 MCG tablet Commonly known as: SYNTHROID Take 1 tablet (100 mcg total) by mouth daily before breakfast. What changed:   medication strength  how much to take  Another medication with the same name was removed. Continue taking this medication, and follow the  directions you see here. Changed by: [name], MD   lisinopril 20 MG tablet Commonly known as: ZESTRIL Take 1 tablet (20 mg total) by mouth daily.   montelukast 10 MG tablet Commonly known as: SINGULAIR TAKE 1 TABLET BY MOUTH EVERYDAY AT BEDTIME What changed: See the new instructions.   pravastatin 40 MG tablet Commonly known as: PRAVACHOL Take 1 tablet (40 mg total) by mouth at bedtime.   promethazine 25 MG tablet Commonly known as: PHENERGAN Take 25 mg by mouth every 6 (six) hours as needed for nausea or vomiting.   rizatriptan 10 MG tablet Commonly known as: MAXALT Take 10 mg by mouth as needed for migraine (may repeat in 2 hours if needed).   sertraline 50 MG tablet Commonly known as: ZOLOFT TAKE 1 TABLET BY MOUTH EVERYDAY AT BEDTIME What changed: See the new instructions.   solifenacin 5 MG tablet Commonly known as: VESIcare Take 1 tablet (5 mg total) by mouth daily. For bladder control What changed:   when to take this  reasons to take this  additional instructions   trazodone 300 MG tablet Commonly known as: DESYREL Take 1 tablet (300 mg total) by mouth at bedtime. What changed: additional instructions Changed by: [name], MD       Meds ordered this encounter  Medications  . furosemide (LASIX) 20 MG tablet    Sig: Take 1 tablet (20 mg total) by mouth every morning.    Dispense:  90 tablet    Refill:  1  . fluticasone furoate-vilanterol (BREO ELLIPTA) 200-25 MCG/INH AEPB    Sig: Inhale 1 puff into the lungs every morning.    Dispense:  1 each    Refill:  11  . levothyroxine (SYNTHROID) 100 MCG tablet    Sig: Take 1 tablet (100 mcg total) by mouth daily before breakfast.    Dispense:  90 tablet    Refill:  1  . sertraline (ZOLOFT) 50 MG tablet    Sig: TAKE 1  TABLET BY MOUTH EVERYDAY AT BEDTIME    Dispense:  90 tablet    Refill:  0  . trazodone (DESYREL) 300 MG tablet    Sig: Take 1 tablet (300 mg total) by mouth at bedtime.     Dispense:  30 tablet    Refill:  1    Patient I discussed sleep hygiene in great detail.  She was given an insomnia handout that includes the principles of sleep hygiene.  Specifically we talked at length about screen time.  We also discussed taking a hot bath laying down in a cool down lit room with a book or magazine or crossword puzzle or something to that effect.  At 11:00 she is to turn out the lights and attempt to sleep for about 15 minutes.  When she takes the bath she is also to take her trazodone at that time.  No screen time within an hour of bedtime.  Regarding the bad dreams, it is possible this is coming from the trazodone, however I would like for her to give her a little bit longer trial to see if this works its way out especially when accompanied by proper sleep hygiene.  Additionally she was advised to resume the Vesicare.  It is totally compatible with the furosemide.  They in fact should work together to help her.  She was taking the Lasix in the evenings.  She was advised to start taking that in the mornings.  She has a Breo inhaler, since that can be somewhat activating I asked her to start taking that in the morning as well.  Follow-up: Return in about 6 weeks (around 10/15/2019).  [name], M.D.

## 2019-09-03 NOTE — Progress Notes (Signed)
Please place orders in epic for knee revision surgery. Pre-op appt is 09/06/2019 . Thank you

## 2019-09-05 ENCOUNTER — Ambulatory Visit: Payer: BC Managed Care – PPO | Admitting: Family Medicine

## 2019-09-06 ENCOUNTER — Other Ambulatory Visit: Payer: Self-pay

## 2019-09-06 ENCOUNTER — Encounter (HOSPITAL_COMMUNITY)
Admission: RE | Admit: 2019-09-06 | Discharge: 2019-09-06 | Disposition: A | Discharge status: Home / Self Care | Payer: Medicare Other | Source: Ambulatory Visit | Attending: Orthopedic Surgery | Admitting: Orthopedic Surgery

## 2019-09-06 ENCOUNTER — Encounter (HOSPITAL_COMMUNITY): Payer: Self-pay

## 2019-09-06 HISTORY — DX: Syncope and collapse: R55

## 2019-09-06 NOTE — Progress Notes (Signed)
PCP - [name], MD Cardiologist -   Chest x-ray - 07/19/2019 epic, CT chest 02/08/2019 epic  EKG - 02/08/2019 epic  Stress Test -  ECHO - 07/17/2019 epic care everywhere  Cardiac Cath -    Sleep Study - yes CPAP - no   Fasting Blood Sugar -  Checks Blood Sugar _____ times a day  Blood Thinner Instructions: Aspirin Instructions: Last Dose:  Anesthesia review:   Hx of htn , osa no cpap in use, occasional sob. July 2020 she had syncope and collapse , she reports this was ue to her severe knee pain as her "knee went out". december 2020 she was treated for lithium toxicity. She reports they discountinued the lithium and her symptoms resolved.    Patient denies shortness of breath, fever, cough and chest pain at PAT appointment   Patient verbalized understanding of instructions that were given to them at the PAT appointment. Patient was also instructed that they will need to review over the PAT instructions again at home before surgery.

## 2019-09-06 NOTE — Telephone Encounter (Signed)
LM for [name] to call for appt to properly manage her medication

## 2019-09-07 ENCOUNTER — Other Ambulatory Visit: Payer: Self-pay | Admitting: Family Medicine

## 2019-09-11 ENCOUNTER — Other Ambulatory Visit: Payer: Self-pay

## 2019-09-11 ENCOUNTER — Encounter (HOSPITAL_COMMUNITY)
Admission: RE | Admit: 2019-09-11 | Discharge: 2019-09-11 | Disposition: A | Discharge status: Home / Self Care | Payer: BC Managed Care – PPO | Source: Ambulatory Visit | Attending: Orthopedic Surgery | Admitting: Orthopedic Surgery

## 2019-09-11 DIAGNOSIS — Z01812 Encounter for preprocedural laboratory examination: Secondary | ICD-10-CM | POA: Insufficient documentation

## 2019-09-11 LAB — COMPREHENSIVE METABOLIC PANEL
ALT: 20 U/L (ref 0–44)
AST: 23 U/L (ref 15–41)
Albumin: 4.3 g/dL (ref 3.5–5.0)
Alkaline Phosphatase: 97 U/L (ref 38–126)
Anion gap: 11 (ref 5–15)
BUN: 21 mg/dL — ABNORMAL HIGH (ref 6–20)
CO2: 24 mmol/L (ref 22–32)
Calcium: 9.4 mg/dL (ref 8.9–10.3)
Chloride: 103 mmol/L (ref 98–111)
Creatinine, Ser: 0.9 mg/dL (ref 0.44–1.00)
GFR calc Af Amer: >60 mL/min (ref >60)
GFR calc non Af Amer: >60 mL/min (ref >60)
Glucose, Bld: 107 mg/dL — ABNORMAL HIGH (ref 70–99)
Potassium: 4.2 mmol/L (ref 3.5–5.1)
Sodium: 138 mmol/L (ref 135–145)
Total Bilirubin: 0.7 mg/dL (ref 0.3–1.2)
Total Protein: 8.1 g/dL (ref 6.5–8.1)

## 2019-09-11 LAB — CBC WITH DIFFERENTIAL/PLATELET
Abs Immature Granulocytes: 0.04 10*3/uL (ref 0.00–0.07)
Basophils Absolute: 0 10*3/uL (ref 0.0–0.1)
Basophils Relative: 1 %
Eosinophils Absolute: 0.2 10*3/uL (ref 0.0–0.5)
Eosinophils Relative: 2 %
HCT: 38.8 % (ref 36.0–46.0)
Hemoglobin: 12.4 g/dL (ref 12.0–15.0)
Immature Granulocytes: 1 %
Lymphocytes Relative: 17 %
Lymphs Abs: 1.4 10*3/uL (ref 0.7–4.0)
MCH: 30.7 pg (ref 26.0–34.0)
MCHC: 32 g/dL (ref 30.0–36.0)
MCV: 96 fL (ref 80.0–100.0)
Monocytes Absolute: 0.6 10*3/uL (ref 0.1–1.0)
Monocytes Relative: 7 %
Neutro Abs: 6 10*3/uL (ref 1.7–7.7)
Neutrophils Relative %: 72 %
Platelets: 246 10*3/uL (ref 150–400)
RBC: 4.04 MIL/uL (ref 3.87–5.11)
RDW: 14.6 % (ref 11.5–15.5)
WBC: 8.1 10*3/uL (ref 4.0–10.5)
nRBC: 0 % (ref 0.0–0.2)

## 2019-09-11 LAB — PROTIME-INR
INR: 1.1 (ref 0.8–1.2)
Prothrombin Time: 13.8 seconds (ref 11.4–15.2)

## 2019-09-11 LAB — SURGICAL PCR SCREEN
MRSA, PCR: NEGATIVE
Staphylococcus aureus: POSITIVE — AB

## 2019-09-11 NOTE — Progress Notes (Signed)
PCR result routed to Dr. Olin for review 

## 2019-09-16 ENCOUNTER — Other Ambulatory Visit (HOSPITAL_COMMUNITY)
Admission: RE | Admit: 2019-09-16 | Discharge: 2019-09-16 | Disposition: A | Discharge status: Home / Self Care | Payer: BC Managed Care – PPO | Source: Ambulatory Visit | Attending: Orthopedic Surgery | Admitting: Orthopedic Surgery

## 2019-09-16 ENCOUNTER — Other Ambulatory Visit: Payer: BC Managed Care – PPO

## 2019-09-16 ENCOUNTER — Other Ambulatory Visit: Payer: Self-pay

## 2019-09-16 DIAGNOSIS — Z01812 Encounter for preprocedural laboratory examination: Secondary | ICD-10-CM | POA: Diagnosis present

## 2019-09-16 DIAGNOSIS — Z20822 Contact with and (suspected) exposure to covid-19: Secondary | ICD-10-CM | POA: Diagnosis not present

## 2019-09-16 LAB — SARS CORONAVIRUS 2 (TAT 6-24 HRS): SARS Coronavirus 2: NEGATIVE

## 2019-09-18 NOTE — H&P (Signed)
TOTAL KNEE RESECTION AND PLACEMENT OF ANTIBIOTIC SPACER vs REVISION ADMISSION H&P  Patient is being admitted for left knee resection and placement of antibiotic spacer vs revision total knee arthroplasty.  Subjective:  Chief Complaint:   Left knee pain s/p TKA.  HPI: [name], 59 y.o. female, has a history of pain and functional disability in the left knee(s) due to inffection / failed previous arthroplasty and patient has failed non-surgical conservative treatments for greater than 12 weeks to include NSAID's and/or analgesics, use of assistive devices and activity modification. The indications for the revision of the total knee arthroplasty are possible infection / pain. Onset of symptoms was gradual starting 4-5 years ago with gradually worsening course since that time.  Prior procedures on the left knee(s) include arthroplasty and revision.  Patient currently rates pain in the left knee(s) at 10 out of 10 with activity. There is night pain, worsening of pain with activity and weight bearing, pain that interferes with activities of daily living, pain with passive range of motion, crepitus and joint swelling.  Patient has evidence of previous TKA by imaging studies. This condition presents safety issues increasing the risk of falls.  Risks, benefits and expectations were discussed with the patient.  Risks including but not limited to the risk of anesthesia, blood clots, nerve damage, blood vessel damage, failure of the prosthesis, infection and up to and including death.  Patient understand the risks, benefits and expectations and wishes to proceed with surgery. I  PCP: [name], MD  D/C Plans:       Home   Post-op Meds:       No Rx given   Tranexamic Acid:      To be given - IV   Decadron:      Is to be given  FYI:      ASA  Oxycodcone  DME:   Pt already has equipment  PT:   Depends on procedure  Pharmacy: [facility] [place]    Patient Active Problem List   Diagnosis Date  Noted  . Overactive bladder 08/20/2019  . Primary insomnia 08/20/2019  . Hypothyroidism 06/05/2019  . Moderate persistent asthma with acute exacerbation 04/03/2019  . Bipolar disorder, in partial remission, most recent episode mixed (HCC) 04/03/2019  . Lumbar radiculopathy 04/03/2019  . Internal derangement of multiple sites of knee 04/03/2019  . Morbid obesity with BMI of 50.0-59.9, adult (HCC) 01/19/2016  . History of total knee arthroplasty 12/23/2014  . Borderline diabetes 10/21/2014  . Transaminasemia 10/21/2014  . Infection of total left knee replacement (HCC) 05/23/2014  . Total knee replacement status 04/16/2014  . Aneurysmal bone cyst, 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  . Osteoarthritis of right knee 10/22/2013  . Complex tear of medial meniscus of right knee as current injury 10/22/2013  . Chronic insomnia 10/27/2012  . Asthma, mild intermittent 10/26/2012  . Osteoarthritis 10/26/2012  . Personal history of colonic polyps 10/26/2012  . GERD (gastroesophageal reflux disease) 10/26/2012  . Essential hypertension, benign 10/26/2012  . Shortness of breath 10/02/2012  . Precordial pain 10/02/2012  . Depression   . High cholesterol   . Migraines    Past Medical History:  Diagnosis Date  . Anemia    hx of with pregnancy   . Anxiety   . Arthritis   . Asthma   . Colon polyps   . Depression   . GERD (gastroesophageal reflux disease)   . Headache    occasional headaches   . High cholesterol   .  Hypertension   . Lithium toxicity 06/2019   has since resolved after discontinuing   . PONV (postoperative nausea and vomiting)   . Shortness of breath dyspnea    due to weight gain   . Sleep apnea    has not use CPAP machine in 2 yrs/ DOES NOT KNOW IF SHE NEEDS TO USE C - PAP  . Syncope and collapse    due to severe knee pain     Past Surgical History:  Procedure Laterality Date  . BACK SURGERY  2012   lumb fusion  . CARPAL TUNNEL RELEASE  2007   both hands  . COLONOSCOPY    .  ENDOMETRIAL ABLATION W/ NOVASURE    . EXCISION METACARPAL MASS Right 03/10/2014   Procedure: EXCISION MASS RIGHT HAND FIRST WEB SPACE DORSAL PALMAR INCISION ;  Surgeon: Cindee Salt, MD;  Location: [facility];  Service: Orthopedics;  Laterality: Right;  . IRRIGATION AND DEBRIDEMENT KNEE Left 05/23/2014   Procedure: IRRIGATION AND DEBRIDEMENT KNEE;  Surgeon: [name], MD;  Location: [facility];  Service: Orthopedics;  Laterality: Left;  With POLYETHYLENE EXCHANGE  . KNEE ARTHROSCOPY     let knee 03/2013   . KNEE ARTHROSCOPY WITH MEDIAL MENISECTOMY Right 10/22/2013   Procedure: RIGHT KNEE ARTHROSCOPY WITH MEDIAL MENISECTOMY, abrasion chrondrplasty Brigid Re OUT/CORRECT CURETTEMENT/BONE GRAFT/PROXIMAL TIBIA ;  Surgeon: Jacki Cones, MD;  Location: [facility];  Service: Orthopedics;  Laterality: Right;  . TOTAL KNEE ARTHROPLASTY Left 04/16/2014   Procedure: LEFT TOTAL KNEE ARTHROPLASTY;  Surgeon: Jacki Cones, MD;  Location: [facility];  Service: Orthopedics;  Laterality: Left;  . TOTAL KNEE ARTHROPLASTY Right 12/23/2014   Procedure: TOTAL KNEE ARTHROPLASTY;  Surgeon: Ranee Gosselin, MD;  Location: [facility];  Service: Orthopedics;  Laterality: Right;  . trigger finger release surgeyr      bilateral   . TUBAL LIGATION      No current facility-administered medications for this encounter.   Current Outpatient Medications  Medication Sig Dispense Refill Last Dose  . albuterol (VENTOLIN HFA) 108 (90 Base) MCG/ACT inhaler Inhale 2 puffs into the lungs every 6 (six) hours as needed for wheezing or shortness of breath. 18 g 5   . cloNIDine (CATAPRES) 0.1 MG tablet Take 1 tablet (0.1 mg total) by mouth 2 (two) times daily. 60 tablet 2   . lisinopril (ZESTRIL) 20 MG tablet Take 1 tablet (20 mg total) by mouth daily. 90 tablet 1   . montelukast (SINGULAIR) 10 MG tablet TAKE 1 TABLET BY MOUTH EVERYDAY AT BEDTIME (Patient taking differently: Take 10 mg by mouth at bedtime. ) 90 tablet 1   . pravastatin  (PRAVACHOL) 40 MG tablet Take 1 tablet (40 mg total) by mouth at bedtime. 90 tablet 1   . promethazine (PHENERGAN) 25 MG tablet Take 25 mg by mouth every 6 (six) hours as needed for nausea or vomiting.     . solifenacin (VESICARE) 5 MG tablet Take 1 tablet (5 mg total) by mouth daily. For bladder control (Patient taking differently: Take 5 mg by mouth daily as needed (bladder control). ) 30 tablet 5   . fluticasone furoate-vilanterol (BREO ELLIPTA) 200-25 MCG/INH AEPB Inhale 1 puff into the lungs every morning. 1 each 11   . furosemide (LASIX) 20 MG tablet Take 1 tablet (20 mg total) by mouth every morning. 90 tablet 1   . levothyroxine (SYNTHROID) 100 MCG tablet Take 1 tablet (100 mcg total) by mouth daily before breakfast. 90 tablet 1   .  rizatriptan (MAXALT) 10 MG tablet TAKE ONE TABLET AS NEEDED FOR MIGRAINE FOR UP TO 1 DOSE. MAY REPEAT IN 2 HOURS IF NEEDED 30 tablet 2   . sertraline (ZOLOFT) 50 MG tablet TAKE 1 TABLET BY MOUTH EVERYDAY AT BEDTIME 90 tablet 0   . trazodone (DESYREL) 300 MG tablet Take 1 tablet (300 mg total) by mouth at bedtime. 30 tablet 1    Allergies  Allergen Reactions  . Adhesive [Tape] Other (See Comments)    Peeled off skin  . Ceclor [Cefaclor] Other (See Comments)    Blisters on hands  . Chocolate Other (See Comments)    migraines  . Venlafaxine Other (See Comments)    "refuses to take - makes me feel like I am dying"    Social History   Tobacco Use  . Smoking status: Never Smoker  . Smokeless tobacco: Never Used  Substance Use Topics  . Alcohol use: No    Family History  Problem Relation Age of Onset  . Alcohol abuse Mother   . Arthritis Mother   . Heart disease Mother   . Hyperlipidemia Father   . Hypertension Father   . Heart disease Father 53       CABG  . Gout Father   . Hypertension Sister   . Cancer Brother        colon, lung, liver  . Cancer Maternal Grandmother        breast  . Cancer Daughter        NEUROBLASTOMA     Review of  Systems  Constitutional: Negative.   HENT: Negative.   Eyes: Negative.   Respiratory: Negative.   Cardiovascular: Negative.   Gastrointestinal: Positive for heartburn.  Genitourinary: Negative.   Musculoskeletal: Positive for joint pain.  Skin: Negative.   Neurological: Positive for headaches.  Endo/Heme/Allergies: Negative.   Psychiatric/Behavioral: Positive for depression. The patient is nervous/anxious and has insomnia.       Objective:  Physical Exam  Constitutional: She is oriented to person, place, and time. She appears well-developed.  HENT:  Head: Normocephalic.  Eyes: Pupils are equal, round, and reactive to light.  Neck: No JVD present. No tracheal deviation present. No thyromegaly present.  Cardiovascular: Normal rate, regular rhythm and intact distal pulses.  Respiratory: Effort normal and breath sounds normal. No respiratory distress. She has no wheezes.  GI: Soft. There is no abdominal tenderness. There is no guarding.  Musculoskeletal:     Cervical back: Neck supple.     Left knee: Swelling and bony tenderness present. No deformity, erythema, ecchymosis or lacerations (healed previous incision). Decreased range of motion. Tenderness present.  Lymphadenopathy:    She has no cervical adenopathy.  Neurological: She is alert and oriented to person, place, and time.  Skin: Skin is warm and dry.  Psychiatric: She has a normal mood and affect.     Labs:  Estimated body mass index is 65.56 kg/m as calculated from the following:   Height as of 09/11/19: 5\' 3"  (1.6 m).   Weight as of 09/11/19: 167.9 kg.  Imaging Review Plain radiographs demonstrate previous TKA of the left knee(s). The overall alignment is neutral. The bone quality appears to be good for age and reported activity level.    Assessment/Plan:  Left knee resection and placement of antibiotic spacer vs revision total knee arthroplasty.   The patient history, physical examination, clinical judgment  of the provider and imaging studies are consistent with infrection / failed TKA of the left knee(s),  previous total knee arthroplasty. Revision total knee arthroplasty is deemed medically necessary. The treatment options including medical management, injection therapy, arthroscopy and revision arthroplasty were discussed at length. The risks and benefits of revision total knee arthroplasty were presented and reviewed. The risks due to aseptic loosening, infection, stiffness, patella tracking problems, thromboembolic complications and other imponderables were discussed. The patient acknowledged the explanation, agreed to proceed with the plan and consent was signed. Patient is being admitted for inpatient treatment for surgery, pain control, PT, OT, prophylactic antibiotics, VTE prophylaxis, progressive ambulation and ADL's and discharge planning.The patient is planning to be discharged home.    Anastasio Auerbach Jmarion Christiano   PA-C  09/18/2019, 10:21 PM

## 2019-09-19 ENCOUNTER — Other Ambulatory Visit: Payer: Self-pay

## 2019-09-19 ENCOUNTER — Inpatient Hospital Stay (HOSPITAL_COMMUNITY)
Admission: RE | Admit: 2019-09-19 | Discharge: 2019-09-20 | DRG: 467 | Disposition: A | Discharge status: Home Health | Payer: BC Managed Care – PPO | Source: Other Acute Inpatient Hospital | Attending: Orthopedic Surgery | Admitting: Orthopedic Surgery

## 2019-09-19 ENCOUNTER — Inpatient Hospital Stay (HOSPITAL_COMMUNITY): Payer: BC Managed Care – PPO | Admitting: Physician Assistant

## 2019-09-19 ENCOUNTER — Encounter (HOSPITAL_COMMUNITY): Payer: Self-pay | Admitting: Orthopedic Surgery

## 2019-09-19 ENCOUNTER — Encounter (HOSPITAL_COMMUNITY)
Admission: RE | Disposition: A | Discharge status: Home Health | Payer: Self-pay | Source: Other Acute Inpatient Hospital | Attending: Orthopedic Surgery

## 2019-09-19 DIAGNOSIS — Z79899 Other long term (current) drug therapy: Secondary | ICD-10-CM

## 2019-09-19 DIAGNOSIS — Z8349 Family history of other endocrine, nutritional and metabolic diseases: Secondary | ICD-10-CM

## 2019-09-19 DIAGNOSIS — Z803 Family history of malignant neoplasm of breast: Secondary | ICD-10-CM | POA: Diagnosis not present

## 2019-09-19 DIAGNOSIS — M1711 Unilateral primary osteoarthritis, right knee: Secondary | ICD-10-CM | POA: Diagnosis present

## 2019-09-19 DIAGNOSIS — N3281 Overactive bladder: Secondary | ICD-10-CM | POA: Diagnosis present

## 2019-09-19 DIAGNOSIS — G43909 Migraine, unspecified, not intractable, without status migrainosus: Secondary | ICD-10-CM | POA: Diagnosis present

## 2019-09-19 DIAGNOSIS — Z801 Family history of malignant neoplasm of trachea, bronchus and lung: Secondary | ICD-10-CM

## 2019-09-19 DIAGNOSIS — Z79890 Hormone replacement therapy (postmenopausal): Secondary | ICD-10-CM

## 2019-09-19 DIAGNOSIS — Z8249 Family history of ischemic heart disease and other diseases of the circulatory system: Secondary | ICD-10-CM | POA: Diagnosis not present

## 2019-09-19 DIAGNOSIS — K219 Gastro-esophageal reflux disease without esophagitis: Secondary | ICD-10-CM | POA: Diagnosis present

## 2019-09-19 DIAGNOSIS — Z7951 Long term (current) use of inhaled steroids: Secondary | ICD-10-CM | POA: Diagnosis not present

## 2019-09-19 DIAGNOSIS — E039 Hypothyroidism, unspecified: Secondary | ICD-10-CM | POA: Diagnosis present

## 2019-09-19 DIAGNOSIS — J4520 Mild intermittent asthma, uncomplicated: Secondary | ICD-10-CM | POA: Diagnosis present

## 2019-09-19 DIAGNOSIS — I10 Essential (primary) hypertension: Secondary | ICD-10-CM | POA: Diagnosis present

## 2019-09-19 DIAGNOSIS — Y831 Surgical operation with implant of artificial internal device as the cause of abnormal reaction of the patient, or of later complication, without mention of misadventure at the time of the procedure: Secondary | ICD-10-CM | POA: Diagnosis present

## 2019-09-19 DIAGNOSIS — Z8261 Family history of arthritis: Secondary | ICD-10-CM | POA: Diagnosis not present

## 2019-09-19 DIAGNOSIS — R7982 Elevated C-reactive protein (CRP): Secondary | ICD-10-CM | POA: Diagnosis present

## 2019-09-19 DIAGNOSIS — F419 Anxiety disorder, unspecified: Secondary | ICD-10-CM | POA: Diagnosis present

## 2019-09-19 DIAGNOSIS — Z89529 Acquired absence of unspecified knee: Secondary | ICD-10-CM | POA: Diagnosis present

## 2019-09-19 DIAGNOSIS — Z800 Family history of malignant neoplasm of digestive organs: Secondary | ICD-10-CM

## 2019-09-19 DIAGNOSIS — Z8719 Personal history of other diseases of the digestive system: Secondary | ICD-10-CM

## 2019-09-19 DIAGNOSIS — G4730 Sleep apnea, unspecified: Secondary | ICD-10-CM | POA: Diagnosis present

## 2019-09-19 DIAGNOSIS — Z811 Family history of alcohol abuse and dependence: Secondary | ICD-10-CM

## 2019-09-19 DIAGNOSIS — R7303 Prediabetes: Secondary | ICD-10-CM | POA: Diagnosis present

## 2019-09-19 DIAGNOSIS — F329 Major depressive disorder, single episode, unspecified: Secondary | ICD-10-CM | POA: Diagnosis present

## 2019-09-19 DIAGNOSIS — Z808 Family history of malignant neoplasm of other organs or systems: Secondary | ICD-10-CM

## 2019-09-19 DIAGNOSIS — Z96652 Presence of left artificial knee joint: Secondary | ICD-10-CM

## 2019-09-19 DIAGNOSIS — Z6841 Body Mass Index (BMI) 40.0 and over, adult: Secondary | ICD-10-CM

## 2019-09-19 DIAGNOSIS — Z20822 Contact with and (suspected) exposure to covid-19: Secondary | ICD-10-CM | POA: Diagnosis present

## 2019-09-19 DIAGNOSIS — T84093A Other mechanical complication of internal left knee prosthesis, initial encounter: Principal | ICD-10-CM | POA: Diagnosis present

## 2019-09-19 HISTORY — PX: EXCISIONAL TOTAL KNEE ARTHROPLASTY WITH ANTIBIOTIC SPACERS: SHX5827

## 2019-09-19 LAB — URINALYSIS, ROUTINE W REFLEX MICROSCOPIC
Bacteria, UA: NONE SEEN
Bilirubin Urine: NEGATIVE
Glucose, UA: NEGATIVE mg/dL
Hgb urine dipstick: NEGATIVE
Ketones, ur: NEGATIVE mg/dL
Leukocytes,Ua: NEGATIVE
Nitrite: NEGATIVE
Protein, ur: NEGATIVE mg/dL
Specific Gravity, Urine: 1.03 (ref 1.005–1.030)
pH: 5 (ref 5.0–8.0)

## 2019-09-19 SURGERY — REMOVAL, TOTAL ARTHROPLASTY HARDWARE, KNEE, WITH ANTIBIOTIC SPACER INSERTION
Anesthesia: General | Site: Knee | Laterality: Left

## 2019-09-19 MED ORDER — ASPIRIN 81 MG PO CHEW
81.0000 mg | CHEWABLE_TABLET | Freq: Two times a day (BID) | ORAL | Status: DC
  Administered 2019-09-19 – 2019-09-20 (×2): 81 mg via ORAL
  Filled 2019-09-19 (×2): qty 1

## 2019-09-19 MED ORDER — GLYCOPYRROLATE PF 0.2 MG/ML IJ SOSY
PREFILLED_SYRINGE | INTRAMUSCULAR | Status: DC | PRN
  Administered 2019-09-19 (×2): .2 mg via INTRAVENOUS

## 2019-09-19 MED ORDER — FERROUS SULFATE 325 (65 FE) MG PO TABS
325.0000 mg | ORAL_TABLET | Freq: Two times a day (BID) | ORAL | Status: DC
  Administered 2019-09-19 – 2019-09-20 (×2): 325 mg via ORAL
  Filled 2019-09-19 (×2): qty 1

## 2019-09-19 MED ORDER — ONDANSETRON HCL 4 MG/2ML IJ SOLN: 4.0000 mg | Freq: Four times a day (QID) | INTRAMUSCULAR | Status: DC | PRN

## 2019-09-19 MED ORDER — SUGAMMADEX SODIUM 200 MG/2ML IV SOLN
INTRAVENOUS | Status: DC | PRN
  Administered 2019-09-19: 400 mg via INTRAVENOUS

## 2019-09-19 MED ORDER — FUROSEMIDE 20 MG PO TABS
20.0000 mg | ORAL_TABLET | Freq: Every day | ORAL | Status: DC
  Administered 2019-09-20: 20 mg via ORAL
  Filled 2019-09-19: qty 1

## 2019-09-19 MED ORDER — SCOPOLAMINE 1 MG/3DAYS TD PT72
MEDICATED_PATCH | TRANSDERMAL | Status: AC
  Filled 2019-09-19: qty 1

## 2019-09-19 MED ORDER — ACETAMINOPHEN 500 MG PO TABS
ORAL_TABLET | ORAL | Status: AC
  Administered 2019-09-19: 09:00:00 1000 mg via ORAL
  Filled 2019-09-19: qty 2

## 2019-09-19 MED ORDER — SCOPOLAMINE 1 MG/3DAYS TD PT72
MEDICATED_PATCH | TRANSDERMAL | Status: DC | PRN
  Administered 2019-09-19: 1 via TRANSDERMAL

## 2019-09-19 MED ORDER — FENTANYL CITRATE (PF) 100 MCG/2ML IJ SOLN
INTRAMUSCULAR | Status: AC
  Filled 2019-09-19: qty 2

## 2019-09-19 MED ORDER — IRRISEPT - 450ML BOTTLE WITH 0.05% CHG IN STERILE WATER, USP 99.95% OPTIME
TOPICAL | Status: DC | PRN
  Administered 2019-09-19: 450 mL

## 2019-09-19 MED ORDER — BISACODYL 10 MG RE SUPP: 10.0000 mg | Freq: Every day | RECTAL | Status: DC | PRN

## 2019-09-19 MED ORDER — KETAMINE HCL 10 MG/ML IJ SOLN
INTRAMUSCULAR | Status: AC
  Filled 2019-09-19: qty 1

## 2019-09-19 MED ORDER — OXYCODONE HCL 5 MG PO TABS: 5.0000 mg | ORAL_TABLET | ORAL | Status: DC | PRN

## 2019-09-19 MED ORDER — ATROPINE SULFATE 0.4 MG/ML IV SOSY
PREFILLED_SYRINGE | INTRAVENOUS | Status: DC | PRN
  Administered 2019-09-19: .4 mg via INTRAVENOUS

## 2019-09-19 MED ORDER — FLUTICASONE FUROATE-VILANTEROL 200-25 MCG/INH IN AEPB
1.0000 | INHALATION_SPRAY | Freq: Every day | RESPIRATORY_TRACT | Status: DC
  Administered 2019-09-20: 1 via RESPIRATORY_TRACT
  Filled 2019-09-19: qty 28

## 2019-09-19 MED ORDER — OXYCODONE HCL 5 MG PO TABS
10.0000 mg | ORAL_TABLET | ORAL | Status: DC | PRN
  Administered 2019-09-19 – 2019-09-20 (×5): 15 mg via ORAL
  Filled 2019-09-19 (×5): qty 3

## 2019-09-19 MED ORDER — LISINOPRIL 20 MG PO TABS
20.0000 mg | ORAL_TABLET | Freq: Every day | ORAL | Status: DC
  Administered 2019-09-20: 09:00:00 20 mg via ORAL
  Filled 2019-09-19: qty 1

## 2019-09-19 MED ORDER — METOCLOPRAMIDE HCL 5 MG/ML IJ SOLN: 5.0000 mg | Freq: Three times a day (TID) | INTRAMUSCULAR | Status: DC | PRN

## 2019-09-19 MED ORDER — ROCURONIUM BROMIDE 10 MG/ML (PF) SYRINGE
PREFILLED_SYRINGE | INTRAVENOUS | Status: AC
  Filled 2019-09-19: qty 10

## 2019-09-19 MED ORDER — SODIUM CHLORIDE 0.9% FLUSH
INTRAVENOUS | Status: DC | PRN
  Administered 2019-09-19: 30 mL

## 2019-09-19 MED ORDER — VANCOMYCIN HCL 1000 MG IV SOLR
INTRAVENOUS | Status: DC | PRN
  Administered 2019-09-19: 1000 mg
  Administered 2019-09-19 (×2): 1000 mg via TOPICAL

## 2019-09-19 MED ORDER — METHOCARBAMOL 500 MG IVPB - SIMPLE MED
500.0000 mg | Freq: Four times a day (QID) | INTRAVENOUS | Status: DC | PRN
  Administered 2019-09-19: 500 mg via INTRAVENOUS
  Filled 2019-09-19: qty 50

## 2019-09-19 MED ORDER — METHOCARBAMOL 500 MG IVPB - SIMPLE MED
INTRAVENOUS | Status: AC
  Filled 2019-09-19: qty 50

## 2019-09-19 MED ORDER — DARIFENACIN HYDROBROMIDE ER 7.5 MG PO TB24
7.5000 mg | ORAL_TABLET | Freq: Every day | ORAL | Status: DC
  Administered 2019-09-20: 09:00:00 7.5 mg via ORAL
  Filled 2019-09-19: qty 1

## 2019-09-19 MED ORDER — KETAMINE HCL 10 MG/ML IJ SOLN
INTRAMUSCULAR | Status: DC | PRN
  Administered 2019-09-19: 50 mg via INTRAVENOUS

## 2019-09-19 MED ORDER — SODIUM CHLORIDE 0.9 % IR SOLN
Status: DC | PRN
  Administered 2019-09-19: 3000 mL

## 2019-09-19 MED ORDER — 0.9 % SODIUM CHLORIDE (POUR BTL) OPTIME
TOPICAL | Status: DC | PRN
  Administered 2019-09-19: 1000 mL

## 2019-09-19 MED ORDER — SODIUM CHLORIDE 0.9 % IV SOLN: INTRAVENOUS | Status: DC

## 2019-09-19 MED ORDER — BUPIVACAINE HCL (PF) 0.25 % IJ SOLN
INTRAMUSCULAR | Status: DC | PRN
  Administered 2019-09-19: 30 mL

## 2019-09-19 MED ORDER — POVIDONE-IODINE 10 % EX SWAB
2.0000 "application " | Freq: Once | CUTANEOUS | Status: AC
  Administered 2019-09-19: 2 via TOPICAL

## 2019-09-19 MED ORDER — ACETAMINOPHEN 325 MG PO TABS
325.0000 mg | ORAL_TABLET | Freq: Four times a day (QID) | ORAL | Status: DC | PRN
  Administered 2019-09-19 – 2019-09-20 (×2): 650 mg via ORAL
  Filled 2019-09-19 (×2): qty 2

## 2019-09-19 MED ORDER — FENTANYL CITRATE (PF) 100 MCG/2ML IJ SOLN
25.0000 ug | INTRAMUSCULAR | Status: DC | PRN
  Administered 2019-09-19 (×3): 50 ug via INTRAVENOUS

## 2019-09-19 MED ORDER — PROPOFOL 1000 MG/100ML IV EMUL
INTRAVENOUS | Status: AC
  Filled 2019-09-19: qty 300

## 2019-09-19 MED ORDER — HYDROMORPHONE HCL 1 MG/ML IJ SOLN
0.5000 mg | INTRAMUSCULAR | Status: DC | PRN
  Administered 2019-09-19: 17:00:00 1 mg via INTRAVENOUS
  Filled 2019-09-19: qty 1

## 2019-09-19 MED ORDER — DEXTROSE 5 % IV SOLN
3.0000 g | INTRAVENOUS | Status: AC
  Administered 2019-09-19: 3 g via INTRAVENOUS
  Filled 2019-09-19: qty 3

## 2019-09-19 MED ORDER — ROPIVACAINE HCL 7.5 MG/ML IJ SOLN
INTRAMUSCULAR | Status: DC | PRN
  Administered 2019-09-19: 20 mL via PERINEURAL

## 2019-09-19 MED ORDER — DEXAMETHASONE SODIUM PHOSPHATE 10 MG/ML IJ SOLN
10.0000 mg | Freq: Once | INTRAMUSCULAR | Status: AC
  Administered 2019-09-20: 09:00:00 10 mg via INTRAVENOUS
  Filled 2019-09-19: qty 1

## 2019-09-19 MED ORDER — PROPOFOL 500 MG/50ML IV EMUL
INTRAVENOUS | Status: AC
  Filled 2019-09-19: qty 50

## 2019-09-19 MED ORDER — LACTATED RINGERS IV SOLN: INTRAVENOUS | Status: DC

## 2019-09-19 MED ORDER — DIPHENHYDRAMINE HCL 12.5 MG/5ML PO ELIX
12.5000 mg | ORAL_SOLUTION | ORAL | Status: DC | PRN
  Administered 2019-09-19 – 2019-09-20 (×2): 25 mg via ORAL
  Filled 2019-09-19 (×2): qty 10

## 2019-09-19 MED ORDER — BUPIVACAINE HCL 0.25 % IJ SOLN
INTRAMUSCULAR | Status: AC
  Filled 2019-09-19: qty 1

## 2019-09-19 MED ORDER — MONTELUKAST SODIUM 10 MG PO TABS
10.0000 mg | ORAL_TABLET | Freq: Every day | ORAL | Status: DC
  Administered 2019-09-19: 10 mg via ORAL
  Filled 2019-09-19: qty 1

## 2019-09-19 MED ORDER — CLONIDINE HCL (ANALGESIA) 100 MCG/ML EP SOLN
EPIDURAL | Status: DC | PRN
  Administered 2019-09-19: 100 ug

## 2019-09-19 MED ORDER — PROPOFOL 10 MG/ML IV BOLUS
INTRAVENOUS | Status: AC
  Filled 2019-09-19: qty 20

## 2019-09-19 MED ORDER — STERILE WATER FOR IRRIGATION IR SOLN
Status: DC | PRN
  Administered 2019-09-19: 2000 mL

## 2019-09-19 MED ORDER — ALUM & MAG HYDROXIDE-SIMETH 200-200-20 MG/5ML PO SUSP: 15.0000 mL | ORAL | Status: DC | PRN

## 2019-09-19 MED ORDER — FENTANYL CITRATE (PF) 100 MCG/2ML IJ SOLN
INTRAMUSCULAR | Status: DC | PRN
  Administered 2019-09-19: 100 ug via INTRAVENOUS

## 2019-09-19 MED ORDER — VANCOMYCIN HCL 1000 MG IV SOLR
INTRAVENOUS | Status: AC
  Filled 2019-09-19: qty 1000

## 2019-09-19 MED ORDER — PROPOFOL 500 MG/50ML IV EMUL
INTRAVENOUS | Status: DC | PRN
  Administered 2019-09-19: 120 ug/kg/min via INTRAVENOUS

## 2019-09-19 MED ORDER — PROMETHAZINE HCL 25 MG PO TABS: 25.0000 mg | ORAL_TABLET | Freq: Four times a day (QID) | ORAL | Status: DC | PRN

## 2019-09-19 MED ORDER — ACETAMINOPHEN 500 MG PO TABS: 1000.0000 mg | ORAL_TABLET | Freq: Once | ORAL | Status: AC

## 2019-09-19 MED ORDER — PROPOFOL 10 MG/ML IV BOLUS
INTRAVENOUS | Status: DC | PRN
  Administered 2019-09-19: 200 mg via INTRAVENOUS

## 2019-09-19 MED ORDER — VANCOMYCIN HCL 2000 MG/400ML IV SOLN
2000.0000 mg | Freq: Once | INTRAVENOUS | Status: AC
  Administered 2019-09-19: 19:00:00 2000 mg via INTRAVENOUS
  Filled 2019-09-19: qty 400

## 2019-09-19 MED ORDER — PHENOL 1.4 % MT LIQD: 1.0000 | OROMUCOSAL | Status: DC | PRN

## 2019-09-19 MED ORDER — SERTRALINE HCL 50 MG PO TABS
50.0000 mg | ORAL_TABLET | Freq: Every day | ORAL | Status: DC
  Administered 2019-09-19: 50 mg via ORAL
  Filled 2019-09-19: qty 1

## 2019-09-19 MED ORDER — CEFAZOLIN SODIUM-DEXTROSE 2-4 GM/100ML-% IV SOLN: 2.0000 g | Freq: Four times a day (QID) | INTRAVENOUS | Status: DC

## 2019-09-19 MED ORDER — METHOCARBAMOL 500 MG PO TABS
500.0000 mg | ORAL_TABLET | Freq: Four times a day (QID) | ORAL | Status: DC | PRN
  Administered 2019-09-19 – 2019-09-20 (×2): 500 mg via ORAL
  Filled 2019-09-19 (×2): qty 1

## 2019-09-19 MED ORDER — MENTHOL 3 MG MT LOZG: 1.0000 | LOZENGE | OROMUCOSAL | Status: DC | PRN

## 2019-09-19 MED ORDER — TRANEXAMIC ACID-NACL 1000-0.7 MG/100ML-% IV SOLN
INTRAVENOUS | Status: AC
  Filled 2019-09-19: qty 100

## 2019-09-19 MED ORDER — VANCOMYCIN HCL 1000 MG IV SOLR
INTRAVENOUS | Status: AC
  Filled 2019-09-19: qty 3000

## 2019-09-19 MED ORDER — TRANEXAMIC ACID-NACL 1000-0.7 MG/100ML-% IV SOLN
1000.0000 mg | INTRAVENOUS | Status: AC
  Administered 2019-09-19: 1000 mg via INTRAVENOUS

## 2019-09-19 MED ORDER — POLYETHYLENE GLYCOL 3350 17 G PO PACK
17.0000 g | PACK | Freq: Two times a day (BID) | ORAL | Status: DC
  Administered 2019-09-19: 22:00:00 17 g via ORAL
  Filled 2019-09-19 (×2): qty 1

## 2019-09-19 MED ORDER — TRANEXAMIC ACID-NACL 1000-0.7 MG/100ML-% IV SOLN
1000.0000 mg | Freq: Once | INTRAVENOUS | Status: AC
  Administered 2019-09-19: 18:00:00 1000 mg via INTRAVENOUS
  Filled 2019-09-19: qty 100

## 2019-09-19 MED ORDER — SODIUM CHLORIDE (PF) 0.9 % IJ SOLN
INTRAMUSCULAR | Status: AC
  Filled 2019-09-19: qty 50

## 2019-09-19 MED ORDER — CHLORHEXIDINE GLUCONATE 4 % EX LIQD: 60.0000 mL | Freq: Once | CUTANEOUS | Status: DC

## 2019-09-19 MED ORDER — ROCURONIUM BROMIDE 10 MG/ML (PF) SYRINGE
PREFILLED_SYRINGE | INTRAVENOUS | Status: DC | PRN
  Administered 2019-09-19: 70 mg via INTRAVENOUS

## 2019-09-19 MED ORDER — KETOROLAC TROMETHAMINE 30 MG/ML IJ SOLN
INTRAMUSCULAR | Status: AC
  Filled 2019-09-19: qty 1

## 2019-09-19 MED ORDER — CLONIDINE HCL 0.1 MG PO TABS
0.1000 mg | ORAL_TABLET | Freq: Two times a day (BID) | ORAL | Status: DC
  Administered 2019-09-20: 0.1 mg via ORAL
  Filled 2019-09-19: qty 1

## 2019-09-19 MED ORDER — LEVOTHYROXINE SODIUM 100 MCG PO TABS
100.0000 ug | ORAL_TABLET | Freq: Every day | ORAL | Status: DC
  Administered 2019-09-20: 100 ug via ORAL
  Filled 2019-09-19: qty 1

## 2019-09-19 MED ORDER — TRAZODONE HCL 100 MG PO TABS
300.0000 mg | ORAL_TABLET | Freq: Every day | ORAL | Status: DC
  Administered 2019-09-19: 22:00:00 300 mg via ORAL
  Filled 2019-09-19: qty 3

## 2019-09-19 MED ORDER — TOBRAMYCIN SULFATE 1.2 G IJ SOLR
INTRAMUSCULAR | Status: AC
  Filled 2019-09-19: qty 3.6

## 2019-09-19 MED ORDER — ONDANSETRON HCL 4 MG/2ML IJ SOLN
INTRAMUSCULAR | Status: DC | PRN
  Administered 2019-09-19 (×2): 4 mg via INTRAVENOUS

## 2019-09-19 MED ORDER — KETOROLAC TROMETHAMINE 30 MG/ML IJ SOLN
INTRAMUSCULAR | Status: DC | PRN
  Administered 2019-09-19: 30 mg

## 2019-09-19 MED ORDER — SUMATRIPTAN SUCCINATE 50 MG PO TABS
50.0000 mg | ORAL_TABLET | ORAL | Status: DC | PRN
  Filled 2019-09-19: qty 1

## 2019-09-19 MED ORDER — CEFAZOLIN SODIUM-DEXTROSE 2-4 GM/100ML-% IV SOLN: 2.0000 g | INTRAVENOUS | Status: DC

## 2019-09-19 MED ORDER — DOCUSATE SODIUM 100 MG PO CAPS
100.0000 mg | ORAL_CAPSULE | Freq: Two times a day (BID) | ORAL | Status: DC
  Administered 2019-09-19 – 2019-09-20 (×2): 100 mg via ORAL
  Filled 2019-09-19 (×2): qty 1

## 2019-09-19 MED ORDER — METOCLOPRAMIDE HCL 5 MG PO TABS: 5.0000 mg | ORAL_TABLET | Freq: Three times a day (TID) | ORAL | Status: DC | PRN

## 2019-09-19 MED ORDER — MIDAZOLAM HCL 2 MG/2ML IJ SOLN
1.0000 mg | INTRAMUSCULAR | Status: DC
  Administered 2019-09-19: 2 mg via INTRAVENOUS
  Filled 2019-09-19: qty 2

## 2019-09-19 MED ORDER — CELECOXIB 200 MG PO CAPS
200.0000 mg | ORAL_CAPSULE | Freq: Two times a day (BID) | ORAL | Status: DC
  Administered 2019-09-19 – 2019-09-20 (×2): 200 mg via ORAL
  Filled 2019-09-19 (×2): qty 1

## 2019-09-19 MED ORDER — ONDANSETRON HCL 4 MG PO TABS: 4.0000 mg | ORAL_TABLET | Freq: Four times a day (QID) | ORAL | Status: DC | PRN

## 2019-09-19 MED ORDER — PRAVASTATIN SODIUM 20 MG PO TABS
40.0000 mg | ORAL_TABLET | Freq: Every day | ORAL | Status: DC
  Administered 2019-09-19: 22:00:00 40 mg via ORAL
  Filled 2019-09-19: qty 2

## 2019-09-19 MED ORDER — PROMETHAZINE HCL 25 MG/ML IJ SOLN: 6.2500 mg | INTRAMUSCULAR | Status: DC | PRN

## 2019-09-19 MED ORDER — ALBUTEROL SULFATE (2.5 MG/3ML) 0.083% IN NEBU: 2.5000 mg | INHALATION_SOLUTION | Freq: Four times a day (QID) | RESPIRATORY_TRACT | Status: DC | PRN

## 2019-09-19 MED ORDER — MAGNESIUM CITRATE PO SOLN: 1.0000 | Freq: Once | ORAL | Status: DC | PRN

## 2019-09-19 MED ORDER — DEXAMETHASONE SODIUM PHOSPHATE 10 MG/ML IJ SOLN
10.0000 mg | Freq: Once | INTRAMUSCULAR | Status: AC
  Administered 2019-09-19: 10 mg via INTRAVENOUS

## 2019-09-19 MED ORDER — FENTANYL CITRATE (PF) 100 MCG/2ML IJ SOLN
50.0000 ug | INTRAMUSCULAR | Status: DC
  Administered 2019-09-19: 50 ug via INTRAVENOUS
  Filled 2019-09-19: qty 2

## 2019-09-19 MED ORDER — LIDOCAINE 2% (20 MG/ML) 5 ML SYRINGE
INTRAMUSCULAR | Status: AC
  Filled 2019-09-19: qty 5

## 2019-09-19 SURGICAL SUPPLY — 68 items
ADH SKN CLS APL DERMABOND .7 (GAUZE/BANDAGES/DRESSINGS) ×2
ATTUNE PSRP INSR SZ4 6 KNEE (Insert) ×1 IMPLANT
ATTUNE PSRP INSR SZ4 6MM KNEE (Insert) ×1 IMPLANT
AUG FEM SZ4 4 REV POST STRL LF (Miscellaneous) ×1 IMPLANT
AUGMENT POST FEM SZ4 4 (Miscellaneous) ×2 IMPLANT
BAG SPEC THK2 15X12 ZIP CLS (MISCELLANEOUS) ×1
BAG ZIPLOCK 12X15 (MISCELLANEOUS) ×3 IMPLANT
BLADE SAW SGTL 11.0X1.19X90.0M (BLADE) ×2 IMPLANT
BLADE SAW SGTL 13.0X1.19X90.0M (BLADE) ×3 IMPLANT
BLADE SAW SGTL 81X20 HD (BLADE) ×3 IMPLANT
BNDG CMPR MED 10X6 ELC LF (GAUZE/BANDAGES/DRESSINGS) ×1
BNDG ELASTIC 6X10 VLCR STRL LF (GAUZE/BANDAGES/DRESSINGS) ×2 IMPLANT
BNDG ELASTIC 6X5.8 VLCR STR LF (GAUZE/BANDAGES/DRESSINGS) ×3 IMPLANT
BOWL SMART MIX CTS (DISPOSABLE) ×3 IMPLANT
BRUSH FEMORAL CANAL (MISCELLANEOUS) ×3 IMPLANT
BSPLAT TIB 3 CMNT REV ROT PLAT (Knees) ×1 IMPLANT
CEMENT HV SMART SET (Cement) ×7 IMPLANT
COMP FEM ATTUNE CRS SZ4 LT (Femur) ×3 IMPLANT
COMPONENT FEM ATN CR SZ4 LT (Femur) IMPLANT
COVER SURGICAL LIGHT HANDLE (MISCELLANEOUS) ×3 IMPLANT
CUFF TOURN SGL QUICK 34 (TOURNIQUET CUFF) ×3
CUFF TRNQT CYL 34X4.125X (TOURNIQUET CUFF) ×1 IMPLANT
DERMABOND ADVANCED (GAUZE/BANDAGES/DRESSINGS) ×4
DERMABOND ADVANCED .7 DNX12 (GAUZE/BANDAGES/DRESSINGS) ×1 IMPLANT
DRAPE U-SHAPE 47X51 STRL (DRAPES) ×3 IMPLANT
DRESSING AQUACEL AG SP 3.5X10 (GAUZE/BANDAGES/DRESSINGS) ×1 IMPLANT
DRSG AQUACEL AG ADV 3.5X14 (GAUZE/BANDAGES/DRESSINGS) ×2 IMPLANT
DRSG AQUACEL AG SP 3.5X10 (GAUZE/BANDAGES/DRESSINGS) ×3
DURAPREP 26ML APPLICATOR (WOUND CARE) ×6 IMPLANT
ELECT REM PT RETURN 15FT ADLT (MISCELLANEOUS) ×3 IMPLANT
GLOVE BIO SURGEON STRL SZ 6 (GLOVE) ×6 IMPLANT
GLOVE BIOGEL PI IND STRL 6.5 (GLOVE) ×1 IMPLANT
GLOVE BIOGEL PI IND STRL 8.5 (GLOVE) ×1 IMPLANT
GLOVE BIOGEL PI INDICATOR 6.5 (GLOVE) ×2
GLOVE BIOGEL PI INDICATOR 8.5 (GLOVE) ×2
GLOVE ECLIPSE 8.0 STRL XLNG CF (GLOVE) ×6 IMPLANT
GLOVE INDICATOR 7.5 STRL GRN (GLOVE) ×3 IMPLANT
GLOVE ORTHO TXT STRL SZ7.5 (GLOVE) ×6 IMPLANT
GOWN STRL REUS W/TWL LRG LVL3 (GOWN DISPOSABLE) ×6 IMPLANT
GOWN STRL REUS W/TWL XL LVL3 (GOWN DISPOSABLE) ×3 IMPLANT
HANDPIECE INTERPULSE COAX TIP (DISPOSABLE) ×3
INSERT TIB CMT ATTUNE RP SZ3 (Knees) ×2 IMPLANT
JET LAVAGE IRRISEPT WOUND (IRRIGATION / IRRIGATOR) ×3
KIT TURNOVER KIT A (KITS) IMPLANT
LAVAGE JET IRRISEPT WOUND (IRRIGATION / IRRIGATOR) ×1 IMPLANT
MANIFOLD NEPTUNE II (INSTRUMENTS) ×3 IMPLANT
NS IRRIG 1000ML POUR BTL (IV SOLUTION) ×3 IMPLANT
PACK TOTAL KNEE CUSTOM (KITS) ×3 IMPLANT
PENCIL SMOKE EVACUATOR (MISCELLANEOUS) ×2 IMPLANT
PIN FIX SIGMA LCS THRD HI (PIN) ×2 IMPLANT
PROTECTOR NERVE ULNAR (MISCELLANEOUS) ×3 IMPLANT
RESTRICTOR CEMENT SZ 5 C-STEM (Cement) ×2 IMPLANT
SET HNDPC FAN SPRY TIP SCT (DISPOSABLE) ×1 IMPLANT
SET PAD KNEE POSITIONER (MISCELLANEOUS) ×3 IMPLANT
SLEEVE TIB ATTUNE FP 37 (Knees) ×2 IMPLANT
STAPLER VISISTAT 35W (STAPLE) IMPLANT
STEM CEMT ATTUNE 14X80 (Knees) ×2 IMPLANT
STEM STR ATTUNE PF 14X60 (Knees) ×2 IMPLANT
SUT MNCRL AB 3-0 PS2 18 (SUTURE) ×5 IMPLANT
SUT STRATAFIX PDS+ 0 24IN (SUTURE) ×3 IMPLANT
SUT VIC AB 1 CT1 36 (SUTURE) ×8 IMPLANT
SUT VIC AB 2-0 CT1 27 (SUTURE) ×9
SUT VIC AB 2-0 CT1 TAPERPNT 27 (SUTURE) ×3 IMPLANT
TRAY FOLEY MTR SLVR 14FR STAT (SET/KITS/TRAYS/PACK) ×2 IMPLANT
TRAY FOLEY MTR SLVR 16FR STAT (SET/KITS/TRAYS/PACK) ×1 IMPLANT
WATER STERILE IRR 1000ML POUR (IV SOLUTION) ×3 IMPLANT
WRAP KNEE MAXI GEL POST OP (GAUZE/BANDAGES/DRESSINGS) ×3 IMPLANT
YANKAUER SUCT BULB TIP 10FT TU (MISCELLANEOUS) ×3 IMPLANT

## 2019-09-19 NOTE — Anesthesia Preprocedure Evaluation (Addendum)
Anesthesia Evaluation  Patient identified by MRN, date of birth, ID band Patient awake    Reviewed: Allergy & Precautions, NPO status , Patient's Chart, lab work & pertinent test results  History of Anesthesia Complications (+) PONV and history of anesthetic complications  Airway Mallampati: I  TM Distance: >3 FB Neck ROM: Full    Dental  (+) Teeth Intact, Dental Advisory Given   Pulmonary asthma , sleep apnea ,    Pulmonary exam normal breath sounds clear to auscultation       Cardiovascular hypertension, Pt. on medications Normal cardiovascular exam Rhythm:Regular Rate:Normal     Neuro/Psych  Headaches, PSYCHIATRIC DISORDERS Anxiety Depression Bipolar Disorder  Neuromuscular disease    GI/Hepatic Neg liver ROS, GERD  ,  Endo/Other  Hypothyroidism Morbid obesity (BMI 65)  Renal/GU negative Renal ROS     Musculoskeletal  (+) Arthritis , Left infected total knee arthroplasty   Abdominal   Peds  Hematology negative hematology ROS (+) Plt 246k   Anesthesia Other Findings Day of surgery medications reviewed with the patient.  Reproductive/Obstetrics                            Anesthesia Physical Anesthesia Plan  ASA: IV  Anesthesia Plan: General   Post-op Pain Management:  Regional for Post-op pain   Induction: Intravenous  PONV Risk Score and Plan: 4 or greater and Diphenhydramine, Scopolamine patch - Pre-op, Midazolam, Dexamethasone and Ondansetron  Airway Management Planned: Oral ETT  Additional Equipment:   Intra-op Plan:   Post-operative Plan: Extubation in OR  Informed Consent: I have reviewed the patients History and Physical, chart, labs and discussed the procedure including the risks, benefits and alternatives for the proposed anesthesia with the patient or authorized representative who has indicated his/her understanding and acceptance.     Dental advisory  given  Plan Discussed with: CRNA  Anesthesia Plan Comments:        Anesthesia Quick Evaluation

## 2019-09-19 NOTE — Evaluation (Signed)
Physical Therapy Evaluation Patient Details Name: [name] MRN: [id number] DOB: 08-25-1960 Today's Date: 09/19/2019   History of Present Illness  Patient is 59 y.o. female s/p Lt TKR on 09/19/19 with PMH significant for HTN, GERD, OA, asthma, depression, anxiety, bil TKA, lumbar fusion.    Clinical Impression  [name] is a 59 y.o. female POD 0 s/p Lt TKR. Patient reports modified independence with RW for mobility at baseline. Patient is now limited by functional impairments (see PT problem list below) and requires min assist for transfers and gait with RW. Patient was able to ambulate ~4 feet with RW and min assist to perform stand step transfer in room. Patient instructed in exercise to facilitate ROM and circulation. Patient will benefit from continued skilled PT interventions to address impairments and progress towards PLOF. Acute PT will follow to progress mobility and stair training in preparation for safe discharge home.    Follow Up Recommendations Follow surgeon's recommendation for DC plan and follow-up therapies    Equipment Recommendations  None recommended by PT    Recommendations for Other Services       Precautions / Restrictions Precautions Precautions: Fall Restrictions Weight Bearing Restrictions: Yes LLE Weight Bearing: Partial weight bearing LLE Partial Weight Bearing Percentage or Pounds: 25%      Mobility  Bed Mobility Overal bed mobility: Needs Assistance Bed Mobility: Supine to Sit     Supine to sit: HOB elevated;Min assist     General bed mobility comments: cues for use of bed rail to raise trunk up and assist for Lt LE mobility.  Transfers Overall transfer level: Needs assistance Equipment used: Rolling walker (2 wheeled) Transfers: Sit to/from [employer] Sit to Stand: Min assist;From elevated surface Stand pivot transfers: Min assist       General transfer comment: cues for safe hand placement and technique and  assist to initiate power up to RW. verbal cues to maintain PWB status on Lt LE. Pt performed small steps/hops on Rt LE with Lt LE maintaining mostly TDWB and using UE's to support herself during Lt stance.  Ambulation/Gait Ambulation/Gait assistance: Min assist Gait Distance (Feet): 5 Feet Assistive device: Rolling walker (2 wheeled)   Gait velocity: decreased   General Gait Details: pt declined to ambulate in hallway today. min assist with cues for PWB on Lt LE and assist to manage position of RW during stand step transfer  Stairs            Wheelchair Mobility    Modified Rankin (Stroke Patients Only)       Balance Overall balance assessment: Needs assistance Sitting-balance support: Feet supported Sitting balance-Leahy Scale: Good     Standing balance support: During functional activity;Bilateral upper extremity supported Standing balance-Leahy Scale: Poor              Pertinent Vitals/Pain Pain Assessment: 0-10 Pain Score: 7  Pain Location: Lt knee Pain Descriptors / Indicators: Aching;Sore Pain Intervention(s): Limited activity within patient's tolerance;Monitored during session;Repositioned    Home Living Family/patient expects to be discharged to:: Private residence Living Arrangements: Spouse/significant other;Children Available Help at Discharge: Family;Available 24 hours/day(pt's husband took off for 1 week) Type of Home: House Home Access: Stairs to enter Entrance Stairs-Rails: Left Entrance Stairs-Number of Steps: 1+1 Home Layout: One level Home Equipment: Shower seat;Grab bars - tub/shower;Bedside commode;Walker - 2 wheels;Walker - 4 wheels;Cane - single point;Cane - quad      Prior Function Level of Independence: Independent with assistive device(s)  Comments: using RW between rooms     Hand Dominance   Dominant Hand: Right    Extremity/Trunk Assessment   Upper Extremity Assessment Upper Extremity Assessment: Overall WFL  for tasks assessed    Lower Extremity Assessment Lower Extremity Assessment: Generalized weakness;LLE deficits/detail LLE Deficits / Details: pt able to perform quad set, pain limiting her and did not test SLR LLE: Unable to fully assess due to immobilization LLE Sensation: WNL LLE Coordination: WNL    Cervical / Trunk Assessment Cervical / Trunk Assessment: Other exceptions Cervical / Trunk Exceptions: large body habitus  Communication   Communication: No difficulties  Cognition Arousal/Alertness: Awake/alert Behavior During Therapy: WFL for tasks assessed/performed Overall Cognitive Status: Within Functional Limits for tasks assessed           General Comments      Exercises Total Joint Exercises Ankle Circles/Pumps: AROM;10 reps;Seated;Both Quad Sets: AROM;5 reps;Seated;Left   Assessment/Plan    PT Assessment Patient needs continued PT services  PT Problem List Decreased strength;Decreased balance;Decreased mobility;Decreased range of motion;Decreased activity tolerance;Decreased knowledge of use of DME;Decreased knowledge of precautions       PT Treatment Interventions DME instruction;Functional mobility training;Gait training;Stair training;Therapeutic exercise;Therapeutic activities;Balance training;Patient/family education    PT Goals (Current goals can be found in the Care Plan section)  Acute Rehab PT Goals Patient Stated Goal: to go home on Saturday PT Goal Formulation: With patient Time For Goal Achievement: 09/26/19 Potential to Achieve Goals: Good    Frequency 7X/week    AM-PAC PT "6 Clicks" Mobility  Outcome Measure Help needed turning from your back to your side while in a flat bed without using bedrails?: A Little Help needed moving from lying on your back to sitting on the side of a flat bed without using bedrails?: A Little Help needed moving to and from a bed to a chair (including a wheelchair)?: A Little Help needed standing up from a chair  using your arms (e.g., wheelchair or bedside chair)?: A Little Help needed to walk in hospital room?: A Little Help needed climbing 3-5 steps with a railing? : A Little 6 Click Score: 18    End of Session Equipment Utilized During Treatment: Gait belt Activity Tolerance: Patient tolerated treatment well Patient left: with call bell/phone within reach;in chair;with chair alarm set;with family/visitor present Nurse Communication: Mobility status PT Visit Diagnosis: Muscle weakness (generalized) (M62.81);Difficulty in walking, not elsewhere classified (R26.2)    Time: 4315-4008 PT Time Calculation (min) (ACUTE ONLY): 28 min   Charges:   PT Evaluation $PT Eval Low Complexity: 1 Low PT Treatments $Therapeutic Activity: 8-22 mins        Verner Mould, DPT Physical Therapist with [facility] [phone]  09/19/2019 6:48 PM

## 2019-09-19 NOTE — Progress Notes (Signed)
AssistedDr. Turk with left, ultrasound guided, adductor canal block. Side rails up, monitors on throughout procedure. See vital signs in flow sheet. Tolerated Procedure well.  

## 2019-09-19 NOTE — Anesthesia Procedure Notes (Signed)
Procedure Name: Intubation Date/Time: 09/19/2019 11:35 AM Performed by: [place] Prabhleen Montemayor C, CRNA Pre-anesthesia Checklist: Patient identified, Emergency Drugs available, Suction available and Patient being monitored Patient Re-evaluated:Patient Re-evaluated prior to induction Oxygen Delivery Method: Circle system utilized Preoxygenation: Pre-oxygenation with 100% oxygen Induction Type: IV induction Ventilation: Mask ventilation without difficulty Laryngoscope Size: Mac and 3 Grade View: Grade I Tube type: Oral Tube size: 7.0 mm Number of attempts: 1 Airway Equipment and Method: Stylet and Oral airway Placement Confirmation: ETT inserted through vocal cords under direct vision,  positive ETCO2 and breath sounds checked- equal and bilateral Secured at: 22 cm Tube secured with: Tape Dental Injury: Teeth and Oropharynx as per pre-operative assessment

## 2019-09-19 NOTE — Progress Notes (Signed)
Notified Surgeon of allergy to Ceclor (blisters on hands); MD wishes to proceed with Ancef for surgical prophylaxis.  Bernadene Person, PharmD, BCPS [phone] 09/19/2019, 9:10 AM

## 2019-09-19 NOTE — Transfer of Care (Signed)
Immediate Anesthesia Transfer of Care Note  Patient: [name]  Procedure(s) Performed: EXCISIONAL  LEFT TOTAL KNEE ARTHROPLASTY WITH TOTAL KNEE REVISION (Left Knee)  Patient Location: PACU  Anesthesia Type:GA combined with regional for post-op pain  Level of Consciousness: awake, alert  and oriented  Airway & Oxygen Therapy: Patient Spontanous Breathing and Patient connected to face mask oxygen  Post-op Assessment: Report given to RN and Post -op Vital signs reviewed and stable  Post vital signs: Reviewed and stable  Last Vitals:  Vitals Value Taken Time  BP 90/54 09/19/19 1457  Temp    Pulse 71 09/19/19 1504  Resp 16 09/19/19 1504  SpO2 98 % 09/19/19 1504  Vitals shown include unvalidated device data.  Last Pain:  Vitals:   09/19/19 1031  TempSrc:   PainSc: 0-No pain      Patients Stated Pain Goal: 5 (09/19/19 2229)  Complications: No apparent anesthesia complications

## 2019-09-19 NOTE — Anesthesia Procedure Notes (Signed)
Anesthesia Regional Block: Adductor canal block   Pre-Anesthetic Checklist: ,, timeout performed, Correct Patient, Correct Site, Correct Laterality, Correct Procedure, Correct Position, site marked, Risks and benefits discussed,  Surgical consent,  Pre-op evaluation,  At surgeon's request and post-op pain management  Laterality: Left  Prep: chloraprep       Needles:  Injection technique: Single-shot  Needle Type: Echogenic Needle     Needle Length: 9cm  Needle Gauge: 21     Additional Needles:   Procedures:,,,, ultrasound used (permanent image in chart),,,,  Narrative:  Start time: 09/19/2019 10:18 AM End time: 09/19/2019 10:28 AM Injection made incrementally with aspirations every 5 mL.  Performed by: Personally  Anesthesiologist: Cecile Hearing, MD  Additional Notes: No pain on injection. No increased resistance to injection. Injection made in 5cc increments.  Good needle visualization.  Patient tolerated procedure well.

## 2019-09-19 NOTE — Interval H&P Note (Signed)
History and Physical Interval Note:  09/19/2019 9:56 AM  [name]  has presented today for surgery, with the diagnosis of Left infected total knee arthroplasty.  The various methods of treatment have been discussed with the patient and family. After consideration of risks, benefits and other options for treatment, the patient has consented to  Procedure(s) with comments: EXCISIONAL  LEFT TOTAL KNEE ARTHROPLASTY WITH ANTIBIOTIC SPACERS (Left) - 2 hrs as a surgical intervention.  The patient's history has been reviewed, patient examined, no change in status, stable for surgery.  I have reviewed the patient's chart and labs.  Questions were answered to the patient's satisfaction.     [name]

## 2019-09-20 ENCOUNTER — Telehealth: Payer: Self-pay | Admitting: Family Medicine

## 2019-09-20 ENCOUNTER — Encounter: Payer: Self-pay | Admitting: *Deleted

## 2019-09-20 LAB — BASIC METABOLIC PANEL
Anion gap: 10 (ref 5–15)
BUN: 21 mg/dL — ABNORMAL HIGH (ref 6–20)
CO2: 23 mmol/L (ref 22–32)
Calcium: 8.8 mg/dL — ABNORMAL LOW (ref 8.9–10.3)
Chloride: 104 mmol/L (ref 98–111)
Creatinine, Ser: 0.93 mg/dL (ref 0.44–1.00)
GFR calc Af Amer: >60 mL/min (ref >60)
GFR calc non Af Amer: >60 mL/min (ref >60)
Glucose, Bld: 145 mg/dL — ABNORMAL HIGH (ref 70–99)
Potassium: 4.6 mmol/L (ref 3.5–5.1)
Sodium: 137 mmol/L (ref 135–145)

## 2019-09-20 LAB — TYPE AND SCREEN
ABO/RH(D): A POS
Antibody Screen: NEGATIVE

## 2019-09-20 LAB — CBC
HCT: 31.7 % — ABNORMAL LOW (ref 36.0–46.0)
Hemoglobin: 10.2 g/dL — ABNORMAL LOW (ref 12.0–15.0)
MCH: 30.4 pg (ref 26.0–34.0)
MCHC: 32.2 g/dL (ref 30.0–36.0)
MCV: 94.3 fL (ref 80.0–100.0)
Platelets: 237 10*3/uL (ref 150–400)
RBC: 3.36 MIL/uL — ABNORMAL LOW (ref 3.87–5.11)
RDW: 13.8 % (ref 11.5–15.5)
WBC: 13.5 10*3/uL — ABNORMAL HIGH (ref 4.0–10.5)
nRBC: 0 % (ref 0.0–0.2)

## 2019-09-20 MED ORDER — DOXYCYCLINE HYCLATE 100 MG PO CAPS: 100.0000 mg | ORAL_CAPSULE | Freq: Two times a day (BID) | ORAL | 0 refills | Status: AC

## 2019-09-20 MED ORDER — ASPIRIN 81 MG PO CHEW: 81.0000 mg | CHEWABLE_TABLET | Freq: Two times a day (BID) | ORAL | 0 refills | Status: AC

## 2019-09-20 MED ORDER — POLYETHYLENE GLYCOL 3350 17 G PO PACK: 17.0000 g | PACK | Freq: Two times a day (BID) | ORAL | 0 refills | Status: DC

## 2019-09-20 MED ORDER — ACETAMINOPHEN 500 MG PO TABS: 1000.0000 mg | ORAL_TABLET | Freq: Three times a day (TID) | ORAL | 0 refills | Status: DC

## 2019-09-20 MED ORDER — DOCUSATE SODIUM 100 MG PO CAPS: 100.0000 mg | ORAL_CAPSULE | Freq: Two times a day (BID) | ORAL | 0 refills | Status: DC

## 2019-09-20 MED ORDER — OXYCODONE HCL 10 MG PO TABS: 10.0000 mg | ORAL_TABLET | ORAL | 0 refills | Status: DC | PRN

## 2019-09-20 MED ORDER — METHOCARBAMOL 500 MG PO TABS: 500.0000 mg | ORAL_TABLET | Freq: Four times a day (QID) | ORAL | 0 refills | Status: DC | PRN

## 2019-09-20 MED ORDER — FERROUS SULFATE 325 (65 FE) MG PO TABS: 325.0000 mg | ORAL_TABLET | Freq: Three times a day (TID) | ORAL | 0 refills | Status: DC

## 2019-09-20 NOTE — Progress Notes (Signed)
     Subjective: 1 Day Post-Op Procedure(s) (LRB): EXCISIONAL  LEFT TOTAL KNEE ARTHROPLASTY WITH TOTAL KNEE REVISION (Left)   Patient reports pain as mild.  Had a good amount of pain yesterday, but already doing better this AM.  Other than pain, no reported events throughout the night. Dr. Charlann Boxer discussed the procedure, findings and expectations moving forward.  Ready to be discharged home, if she does well with PT.   Objective:   VITALS:   Vitals:   09/20/19 0206 09/20/19 0549  BP: (!) 154/95 138/77  Pulse: 77 83  Resp: 16 16  Temp: 98 F (36.7 C) 97.7 F (36.5 C)  SpO2: 95% 94%    Dorsiflexion/Plantar flexion intact Incision: dressing C/D/I No cellulitis present Compartment soft  LABS Recent Labs    09/20/19 0231  HGB 10.2*  HCT 31.7*  WBC 13.5*  PLT 237    Recent Labs    09/20/19 0231  NA 137  K 4.6  BUN 21*  CREATININE 0.93  GLUCOSE 145*     Assessment/Plan: 1 Day Post-Op Procedure(s) (LRB): EXCISIONAL  LEFT TOTAL KNEE ARTHROPLASTY WITH TOTAL KNEE REVISION (Left) Foley cath d/c'ed Advance diet Up with therapy D/C IV fluids Discharge home Follow up in 2 weeks at [facility] Follow up with OLIN,Tondalaya Perren D in 2 weeks.  Contact information:  [facility] [address] [place] [address] [phone]    Morbid Obesity (BMI >40)  Estimated body mass index is 65.56 kg/m as calculated from the following:   Height as of this encounter: 5\' 3"  (1.6 m).   Weight as of this encounter: 167.9 kg. Patient also counseled that weight may inhibit the healing process Patient counseled that losing weight will help with future health issues           PA-C  [facility] is now [facility]  [place] [address], [address], [place], [place] Phone: [phone] [email or web address] Facebook  [phone]

## 2019-09-20 NOTE — Op Note (Signed)
NAME: [name], [name] MEDICAL RECORD [id number] ACCOUNT [id number] DATE OF BIRTH:1961/04/05 FACILITY: [facility] LOCATION: [facility] PHYSICIAN:Arneisha Kincannon Rosalia Hammers, MD  OPERATIVE REPORT  DATE OF PROCEDURE:  09/19/2019  PREOPERATIVE DIAGNOSIS:  Failed left total knee replacement.  POSTOPERATIVE DIAGNOSIS:  Failed left total knee replacement.  PROCEDURE:  Left total knee revision.  COMPONENTS USED:  DePuy Attune revision knee system with a size left 4, Attune revision femur with a 4 mm posterolateral augment, 14 x 80 cemented stem, a size 3 revision tibial tray with a 34 fully porous coated sleeve, 14 x 60 press-fit stem and size 4  x 8 mm posterior stabilized rotating platform insert.  SURGEON:  Durene Romans, MD  ASSISTANT:  Dennie Bible, PA-C.  Note that Ms. Adrian Blackwater was present for the entirety of the case from preoperative positioning, perioperative management of the operative extremity, general facilitation of the case and primary wound closure.  ANESTHESIA:  Regional plus general.  SPECIMENS:  None.  COMPLICATIONS:  None.  DRAINS:  None.  TOURNIQUET TIME:  84 minutes at 250 mmHg.  INDICATIONS:  The patient is a 60 year old female with history of left total knee replacement.  Her index total knee arthroplasty was complicated by wound issues which were treated with I and D and closure without subsequent involvement.  She recently  presented to the office with increasing left knee pain.  Radiographs revealed progressive signs of loosening of the tibial tray particularly, with failure of the component into extension.  Preoperatively, labs revealed an elevated C-reactive protein and  sedimentation rate.  Given the concerns for infection, she was brought back to the office and the knee aspirated.  The white blood cell count at time of aspiration was less than 1000 and no growth after 7 days.  The alpha defensin was negative.  No signs  of clinical infection by aspiration.  We had a lengthy  discussion regarding treatment of resection and antibiotic spacer versus revision to a total knee replacement.  The lab work indicated inflammatory concerns.  Her aspirate was normal.  I discussed  with her that I would have the option available to do either depending on the fluid in the OR and how it looked in the knee.  She was amenable to this option.  The risks of infection, recurrent infection, DVT, component failure, need for future surgeries  were discussed.  The postoperative course was reviewed and the effort required to maximize her recovery.  We discussed her weight and issues regarding her weight in terms of the failure mechanism.  Consent was obtained for benefit of pain relief.  PROCEDURE DETAIL:  The patient was brought to the operative theater.  Once adequate anesthesia, preoperative antibiotics, Ancef administered as well as tranexamic acid and Decadron, she was positioned supine with a left thigh tourniquet placed.  The left  lower extremity was prepped and draped in sterile fashion.  Timeout was performed identifying the patient, the planned procedure and extremity.  The leg was exsanguinated, tourniquet elevated to 250 mmHg.  Her old incision was identified and a portion  of it utilized to expose the knee.  The skin was excised.  Soft tissue planes created.  Median arthrotomy was made, encountering significant amount of scar tissue, but once we entered into the joint, there were no signs of infection.  The synovial fluid  was clear.  Based on this finding and clinical picture already discussed, I elected at this point to proceed with total knee arthroplasty revision.  After exposure, including synovectomy  and scar debridement within the medial and lateral aspect of the  joint as well as suprapatellar, we worked to remove the components.  She was noted to have unstable anterior tibial bone and that I was uncertain if it was part of her native bone or overgrowth from the wear pattern.   Nonetheless, it was not involved  with the tibial tubercle, thus it was preserved.  The tibial component was basically lifted out of the wound.  I then used a thin ACL saw and removed the femoral component, undermined the cement-bone interface and then removed the femoral component  without bone loss.  Following debridement of soft tissues at the end of the components, we reamed the canals up to 14 mm.  Initially on the tibial side, I did not feel that I would be able to get any further, on the femoral side, perhaps a little bit  wider.  I then used an extramedullary guide and made a cutoff at the proximal tibia, removing minimal bone, again trying to protect the anterior cortical bone identified.  I then sized the tibia to be a size 4.  We then broached and I broached up to a 37  mm sleeve.  We then made a trial reduction with the tibial tray with a 37 sleeve in place and kept this in place for orientation of the femoral rotation.  Femoral preparation was carried out per protocol utilizing the jigs.  We revisited the anterior,  posterior and chamfer cuts, as well as the box cut.  We then did a trial reduction with a 4 femur with stemmed component with the tibial tray already in place.  Here, we identified that the knee would come out to extension with a 5-6 mm insert and was  stable from extension to flexion.  With the trial components in place, I focused on the patella and performed scar debridement around it, removing all the scar tissue and then performed an internal lateral release.  Given all these findings, we removed  the trial components.  The synovial capsular junction of the knee was injected with 0.25% Marcaine with epinephrine, 1 mL of Toradol and saline.  Final components were opened and configured on the back table under my direct supervision.  The knee was  then irrigated with normal saline solution.  Once the components were ready, we did mix a batch of cement for the femoral side.  The  tibial side was press-fit with good initial fixation of the sleeve into the metaphyseal bone with a 14 mm stem distally.   On the femoral side, we placed a cement restrictor and then cemented the femoral component in place with 2 batches of cement and 1 g of vancomycin.  The knee was brought to extension with a 6 mm insert.  Extruded cement was removed.  Once the cement  fully cured, excessive cement was removed throughout the knee.  The final 6 mm insert was selected as a posterior stabilized insert.  This was placed in the tibial tray and the knee was reduced.  The knee was re-irrigated after the tourniquet had been  let down.  No significant hemostasis was required.  The extensor mechanism was reapproximated using #1 Vicryl and #1 Stratafix suture.  The remainder of the wound was closed with 2-0 Vicryl and a running Monocryl stitch.  The knee was then cleaned, dried and dressed sterilely using surgical glue and  Aquacel dressing.  The knee was then wrapped in a bulky sterile wrap.  She was then brought to the recovery room in stable condition, tolerating the procedure well.  Findings were reviewed with family.  We will have her admitted to the hospital, working  with physical therapy.  She will be partial weightbearing to allow for bony ingrowth.  I will see her back in the office in 2 weeks.  VN/NUANCE  D:09/20/2019 T:09/20/2019 JOB:[id number]

## 2019-09-20 NOTE — Progress Notes (Signed)
Physical Therapy Treatment Patient Details Name: [name] MRN: [id number] DOB: June 13, 1961 Today's Date: 09/20/2019    History of Present Illness Patient is 59 y.o. female s/p Lt TKR on 09/19/19 with PMH significant for HTN, GERD, OA, asthma, depression, anxiety, bil TKA, lumbar fusion.    PT Comments    POD # 1 am session Assisted OOB to amb.  General bed mobility comments: demonstarted and instructed how to use a belt to self assist LE off bed.  General transfer comment: VC's for proper hand placement as pt attempted to pull self up from walker.  Instructions on 25% with standing.  Safety with turn completion prior to sit. General Gait Details: 75% VC's on proper sequencing and instruction to maintain 25% WBing.  Limited but functional distance. Then returned to room to perform some TE's following HEP handout.  Instructed on proper tech, freq as well as use of ICE.  (pt will need a second session with spouse present to educate on 25% WBing and stairs)    Follow Up Recommendations  Follow surgeon's recommendation for DC plan and follow-up therapies(pt would be ideal for [facility] due to her limited mobilty and limited WBing)     Equipment Recommendations  None recommended by PT(has equipment from prior)    Recommendations for Other Services       Precautions / Restrictions Precautions Precautions: Fall Restrictions Weight Bearing Restrictions: Yes LLE Partial Weight Bearing Percentage or Pounds: 25% Other Position/Activity Restrictions: instructed "no pillow under knee"    Mobility  Bed Mobility Overal bed mobility: Needs Assistance Bed Mobility: Supine to Sit     Supine to sit: Min guard;Min assist     General bed mobility comments: demonstarted and instructed how to use a belt to self assist LE off bed  Transfers Overall transfer level: Needs assistance Equipment used: Rolling walker (2 wheeled) Transfers: Sit to/from [facility] Sit to Stand: Min  guard Stand pivot transfers: Min guard       General transfer comment: VC's for proper hand placement as pt attempted to pull self up from walker.  Instructions on 25% with standing.  Safety with turn completion prior to sit.  Ambulation/Gait Ambulation/Gait assistance: Supervision;Min guard Gait Distance (Feet): 22 Feet Assistive device: Rolling walker (2 wheeled) Gait Pattern/deviations: Step-to pattern;Decreased stance time - left Gait velocity: decreased   General Gait Details: 75% VC's on proper sequencing and instruction to maintain 25% WBing.  Limited but functional distance.   Stairs             Wheelchair Mobility    Modified Rankin (Stroke Patients Only)       Balance                                            Cognition Arousal/Alertness: Awake/alert   Overall Cognitive Status: Within Functional Limits for tasks assessed                                        Exercises  10 reps AP, 5 reps knee presses, 5 reps towel squeezes, 5 reps HS using gait belt ICE applied    General Comments        Pertinent Vitals/Pain Pain Assessment: 0-10 Pain Score: 5  Pain Location: Lt knee Pain Descriptors / Indicators:  Aching;Sore;Operative site guarding;Tender Pain Intervention(s): Monitored during session;Premedicated before session;Repositioned;Ice applied    Home Living                      Prior Function            PT Goals (current goals can now be found in the care plan section) Progress towards PT goals: Progressing toward goals    Frequency    7X/week      PT Plan Current plan remains appropriate    Co-evaluation              AM-PAC PT "6 Clicks" Mobility   Outcome Measure  Help needed turning from your back to your side while in a flat bed without using bedrails?: A Little Help needed moving from lying on your back to sitting on the side of a flat bed without using bedrails?: A  Little Help needed moving to and from a bed to a chair (including a wheelchair)?: A Little Help needed standing up from a chair using your arms (e.g., wheelchair or bedside chair)?: A Little Help needed to walk in hospital room?: A Little Help needed climbing 3-5 steps with a railing? : A Little 6 Click Score: 18    End of Session Equipment Utilized During Treatment: Gait belt Activity Tolerance: Patient tolerated treatment well Patient left: with call bell/phone within reach;in chair;with chair alarm set Nurse Communication: Mobility status(pt will need a second session with spouse present to educate on 25% WBing and stairs) PT Visit Diagnosis: Muscle weakness (generalized) (M62.81);Difficulty in walking, not elsewhere classified (R26.2)     Time: 4765-4650 PT Time Calculation (min) (ACUTE ONLY): 24 min  Charges:  $Gait Training: 8-22 mins $Therapeutic Exercise: 8-22 mins                     {[name]  PTA Acute  Rehabilitation Services Pager      [phone] Office      [phone]

## 2019-09-20 NOTE — Telephone Encounter (Signed)
Patient aware no call from here

## 2019-09-20 NOTE — Progress Notes (Signed)
Physical Therapy Treatment Patient Details Name: [name] MRN: [id number] DOB: 09/04/1960 Today's Date: 09/20/2019    History of Present Illness Patient is 59 y.o. female s/p Lt TKR on 09/19/19 with PMH significant for HTN, GERD, OA, asthma, depression, anxiety, bil TKA, lumbar fusion.    PT Comments    POD # 1 pm session Spouse present for family education on all mentioned below.  General bed mobility comments: demonstarted and instructed how to use a belt to self assist LE off bed.  General transfer comment: VC's for proper hand placement as pt attempted to pull self up from walker.  Instructions on 25% with standing.  Safety with turn completion prior to sit. General Gait Details: 75% VC's on proper sequencing and instruction to maintain 25% WBing.  Limited but functional distance.General stair comments: with spouse present for education up backward due to [facility].  50% VC's on proper tech and performed twice. Then returned to room to perform some TE's following HEP handout.  Instructed on proper tech, freq as well as use of ICE.   Addressed all mobility questions, discussed appropriate activity, educated on use of ICE.  Pt ready for D/C to home.   Follow Up Recommendations  Follow surgeon's recommendation for DC plan and follow-up therapies (pt would be ideal for [facility] due to her limited mobilty and limited WBing)     Equipment Recommendations  None recommended by PT(has equipment from prior)    Recommendations for Other Services       Precautions / Restrictions Precautions Precautions: Fall Restrictions Weight Bearing Restrictions: Yes LLE Partial Weight Bearing Percentage or Pounds: 25% Other Position/Activity Restrictions: instructed "no pillow under knee"    Mobility  Bed Mobility Overal bed mobility: Needs Assistance Bed Mobility: Supine to Sit     Supine to sit: Min guard;Min assist     General bed mobility comments: demonstarted and instructed how to use a belt to  self assist LE off bed  Transfers Overall transfer level: Needs assistance Equipment used: Rolling walker (2 wheeled) Transfers: Sit to/from [facility] Sit to Stand: Min guard Stand pivot transfers: Min guard       General transfer comment: VC's for proper hand placement as pt attempted to pull self up from walker.  Instructions on 25% with standing.  Safety with turn completion prior to sit.  Ambulation/Gait Ambulation/Gait assistance: Supervision;Min guard Gait Distance (Feet): 18 Feet Assistive device: Rolling walker (2 wheeled) Gait Pattern/deviations: Step-to pattern;Decreased stance time - left Gait velocity: decreased   General Gait Details: 75% VC's on proper sequencing and instruction to maintain 25% WBing.  Limited but functional distance.   Stairs Stairs: Yes Stairs assistance: Min guard Stair Management: No rails;Step to pattern;Backwards;With walker Number of Stairs: 1 General stair comments: with spouse present for education up backward due to [facility].  50% VC's on proper tech and performed twice.   Wheelchair Mobility    Modified Rankin (Stroke Patients Only)       Balance                                            Cognition Arousal/Alertness: Awake/alert   Overall Cognitive Status: Within Functional Limits for tasks assessed  Exercises  5 reps LAQ's, ABD/ADd, HS and SLR using gait belt    General Comments        Pertinent Vitals/Pain Pain Assessment: 0-10 Pain Score: 5  Pain Location: Lt knee Pain Descriptors / Indicators: Aching;Sore;Operative site guarding;Tender Pain Intervention(s): Monitored during session;Premedicated before session;Repositioned;Ice applied    Home Living                      Prior Function            PT Goals (current goals can now be found in the care plan section) Progress towards PT goals: Progressing toward  goals    Frequency    7X/week      PT Plan Current plan remains appropriate    Co-evaluation              AM-PAC PT "6 Clicks" Mobility   Outcome Measure  Help needed turning from your back to your side while in a flat bed without using bedrails?: A Little Help needed moving from lying on your back to sitting on the side of a flat bed without using bedrails?: A Little Help needed moving to and from a bed to a chair (including a wheelchair)?: A Little Help needed standing up from a chair using your arms (e.g., wheelchair or bedside chair)?: A Little Help needed to walk in hospital room?: A Little Help needed climbing 3-5 steps with a railing? : A Little 6 Click Score: 18    End of Session Equipment Utilized During Treatment: Gait belt Activity Tolerance: Patient tolerated treatment well Patient left: with call bell/phone within reach;in chair;with chair alarm set Nurse Communication: Mobility status(pt will beed a second session with spouse present to educate on 25% WBing and stairs) PT Visit Diagnosis: Muscle weakness (generalized) (M62.81);Difficulty in walking, not elsewhere classified (R26.2)     Time: 6962-9528 PT Time Calculation (min) (ACUTE ONLY): 27 min  Charges:  $Gait Training: 8-22 mins $Therapeutic Activity: 8-22 mins                     Felecia Shelling  PTA Acute  Rehabilitation Services Pager      [phone] Office      [phone]

## 2019-09-20 NOTE — Plan of Care (Signed)
All discharge instructions were given to the patient.

## 2019-09-20 NOTE — TOC Initial Note (Signed)
Transition of Care [facility]) - Initial/Assessment Note    Patient Details  Name: [name] MRN: [id number] Date of Birth: Jul 21, 1961  Transition of Care [facility]) CM/SW Contact:    Ida Rogue, LCSW Phone Number: 09/20/2019, 1:05 PM  Clinical Narrative:  Patient to d/c today with [facility] PT.  She expressed interest in going with [facility], with whom she has worked in past.  Unfortunately, [facility] is not able to see until next week.  Called Kathlene November with [facility], who is able to see patient with 48 hours of d/c. Patient is OK with [facility] referral. TOC sign off.                Expected Discharge Plan: Home w Home Health Services Barriers to Discharge: No Barriers Identified   Patient Goals and CMS Choice        Expected Discharge Plan and Services Expected Discharge Plan: Home w Home Health Services         Expected Discharge Date: 09/20/19                                    Prior Living Arrangements/Services                       Activities of Daily Living Home Assistive Devices/Equipment: Dan Humphreys (specify type), Raised toilet seat with rails ADL Screening (condition at time of admission) Patient's cognitive ability adequate to safely complete daily activities?: Yes Is the patient deaf or have difficulty hearing?: No Does the patient have difficulty seeing, even when wearing glasses/contacts?: No Does the patient have difficulty concentrating, remembering, or making decisions?: No Patient able to express need for assistance with ADLs?: Yes Does the patient have difficulty dressing or bathing?: No Independently performs ADLs?: Yes (appropriate for developmental age) Communication: Independent Dressing (OT): Independent Grooming: Independent Feeding: Independent Bathing: Independent with device (comment) Is this a change from baseline?: Pre-admission baseline Toileting: Independent with device (comment) In/Out Bed: Independent Walks in Home: Independent with  device (comment) Does the patient have difficulty walking or climbing stairs?: Yes Weakness of Legs: Left Weakness of Arms/Hands: None  Permission Sought/Granted                  Emotional Assessment              Admission diagnosis:  Acquired absence of knee joint following explantation of joint prosthesis with presence of antibiotic-impregnated cement spacer [Z89.529] Patient Active Problem List   Diagnosis Date Noted  . S/P left TKA revision 09/19/2019  . Acquired absence of knee joint following explantation of joint prosthesis with presence of antibiotic-impregnated cement spacer 09/19/2019  . Overactive bladder 08/20/2019  . Primary insomnia 08/20/2019  . Hypothyroidism 06/05/2019  . Moderate persistent asthma with acute exacerbation 04/03/2019  . Bipolar disorder, in partial remission, most recent episode mixed (HCC) 04/03/2019  . Lumbar radiculopathy 04/03/2019  . Internal derangement of multiple sites of knee 04/03/2019  . Morbid obesity with BMI of 50.0-59.9, adult (HCC) 01/19/2016  . History of total knee arthroplasty 12/23/2014  . Borderline diabetes 10/21/2014  . Transaminasemia 10/21/2014  . Infection of total left knee replacement (HCC) 05/23/2014  . Total knee replacement status 04/16/2014  . Aneurysmal bone cyst, 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  . Osteoarthritis of right knee 10/22/2013  . Complex tear of medial meniscus of right knee as current injury 10/22/2013  . Chronic insomnia 10/27/2012  .  Asthma, mild intermittent 10/26/2012  . Osteoarthritis 10/26/2012  . Personal history of colonic polyps 10/26/2012  . GERD (gastroesophageal reflux disease) 10/26/2012  . Essential hypertension, benign 10/26/2012  . Shortness of breath 10/02/2012  . Precordial pain 10/02/2012  . Depression   . High cholesterol   . Migraines    PCP:  [name], MD Pharmacy:   [id number] - [place], [place]  [address] Phone: [phone] Fax: [phone]  [facility] [place], [place] - [address] [place] [address] Phone: [phone] Fax: [phone]     Social Determinants of Health (SDOH) Interventions    Readmission Risk Interventions No flowsheet data found.

## 2019-09-20 NOTE — Discharge Instructions (Addendum)
INSTRUCTIONS AFTER KNEE SURGERY  o Remove items at home which could result in a fall. This includes throw rugs or furniture in walking pathways o ICE to the affected joint every three hours while awake for 30 minutes at a time, for at least the first 3-5 days, and then as needed for pain and swelling.  Continue to use ice for pain and swelling. You may notice swelling that will progress down to the foot and ankle.  This is normal after surgery.  Elevate your leg when you are not up walking on it.   o Continue to use the breathing machine you got in the hospital (incentive spirometer) which will help keep your temperature down.  It is common for your temperature to cycle up and down following surgery, especially at night when you are not up moving around and exerting yourself.  The breathing machine keeps your lungs expanded and your temperature down.   DIET:  As you were doing prior to hospitalization, we recommend a well-balanced diet.  DRESSING / WOUND CARE / SHOWERING  Keep the surgical dressing until follow up.  The dressing is water proof, so you can shower without any extra covering.  IF THE DRESSING FALLS OFF or the wound gets wet inside, change the dressing with sterile gauze.  Please use good hand washing techniques before changing the dressing.  Do not use any lotions or creams on the incision until instructed by your surgeon.    ACTIVITY  o Increase activity slowly as tolerated, but follow the weight bearing instructions below.   o No driving for 6 weeks or until further direction given by your physician.  You cannot drive while taking narcotics.  o No lifting or carrying greater than 10 lbs. until further directed by your surgeon. o Avoid periods of inactivity such as sitting longer than an hour when not asleep. This helps prevent blood clots.  o You may return to work once you are authorized by your doctor.    WEIGHT BEARING   Partial weight bearing with assist device as  directed.  .  CONSTIPATION  Constipation is defined medically as fewer than three stools per week and severe constipation as less than one stool per week.  Even if you have a regular bowel pattern at home, your normal regimen is likely to be disrupted due to multiple reasons following surgery.  Combination of anesthesia, postoperative narcotics, change in appetite and fluid intake all can affect your bowels.   YOU MUST use at least one of the following options; they are listed in order of increasing strength to get the job done.  They are all available over the counter, and you may need to use some, POSSIBLY even all of these options:    Drink plenty of fluids (prune juice may be helpful) and high fiber foods Colace 100 mg by mouth twice a day  Senokot for constipation as directed and as needed Dulcolax (bisacodyl), take with full glass of water  Miralax (polyethylene glycol) once or twice a day as needed.  If you have tried all these things and are unable to have a bowel movement in the first 3-4 days after surgery call either your surgeon or your primary doctor.    If you experience loose stools or diarrhea, hold the medications until you stool forms back up.  If your symptoms do not get better within 1 week or if they get worse, check with your doctor.  If you experience "the worst abdominal pain  ever" or develop nausea or vomiting, please contact the office immediately for further recommendations for treatment.   ITCHING:  If you experience itching with your medications, try taking only a single pain pill, or even half a pain pill at a time.  You can also use Benadryl over the counter for itching or also to help with sleep.   TED HOSE STOCKINGS:  Use stockings on both legs until for at least 2 weeks or as directed by physician office. They may be removed at night for sleeping.  MEDICATIONS:  See your medication summary on the "After Visit Summary" that nursing will review with you.  You may  have some home medications which will be placed on hold until you complete the course of blood thinner medication.  It is important for you to complete the blood thinner medication as prescribed.  PRECAUTIONS:  If you experience chest pain or shortness of breath - call 911 immediately for transfer to the hospital emergency department.   If you develop a fever greater that 101 F, purulent drainage from wound, increased redness or drainage from wound, foul odor from the wound/dressing, or calf pain - CONTACT YOUR SURGEON.                                                   FOLLOW-UP APPOINTMENTS:  If you do not already have a post-op appointment, please call the office for an appointment to be seen by your surgeon.  Guidelines for how soon to be seen are listed in your "After Visit Summary", but are typically between 1-4 weeks after surgery.  OTHER INSTRUCTIONS:   Knee Replacement:  Do not place pillow under knee, focus on keeping the knee straight while resting. CPM instructions: 0-90 degrees, 2 hours in the morning, 2 hours in the afternoon, and 2 hours in the evening. Place foam block, curve side up under heel at all times except when in CPM or when walking.  DO NOT modify, tear, cut, or change the foam block in any way.  MAKE SURE YOU:  . Understand these instructions.  . Get help right away if you are not doing well or get worse.    Thank you for letting us be a part of your medical care team.  It is a privilege we respect greatly.  We hope these instructions will help you stay on track for a fast and full recovery!

## 2019-09-21 NOTE — Brief Op Note (Signed)
09/19/2019  2:00 PM  PATIENT:  [name]  59 y.o. female  PRE-OPERATIVE DIAGNOSIS:  Left infected total knee arthroplasty  POST-OPERATIVE DIAGNOSIS:  unstable left knee total arthroplasty due to aseptic failure  PROCEDURE:  Procedure(s) with comments: EXCISIONAL  LEFT TOTAL KNEE ARTHROPLASTY WITH TOTAL KNEE REVISION (Left) - 2 hrs  SURGEON:  Surgeon(s) and Role:    Durene Romans, MD - Primary  PHYSICIAN ASSISTANT: Dennie Bible, PA-C  ANESTHESIA:   regional and spinal  EBL:  500 mL   BLOOD ADMINISTERED:none  DRAINS: none   LOCAL MEDICATIONS USED:  MARCAINE     SPECIMEN:  No Specimen  DISPOSITION OF SPECIMEN:  N/A  COUNTS:  YES  TOURNIQUET:   Total Tourniquet Time Documented: Thigh (Left) - 85 minutes Total: Thigh (Left) - 85 minutes   DICTATION: .Other Dictation: Dictation Number [id number]  PLAN OF CARE: Admit to inpatient   PATIENT DISPOSITION:  PACU - hemodynamically stable.   Delay start of Pharmacological VTE agent (>24hrs) due to surgical blood loss or risk of bleeding: no

## 2019-09-23 ENCOUNTER — Other Ambulatory Visit: Payer: Self-pay | Admitting: Family Medicine

## 2019-09-24 NOTE — Discharge Summary (Signed)
Physician Discharge Summary  Patient ID: [name] MRN: [id number] DOB/AGE: 57/06/1961 57 y.o.  Admit date: 09/19/2019 Discharge date: 09/20/2019   Procedures:  Procedure(s) (LRB): EXCISIONAL  LEFT TOTAL KNEE ARTHROPLASTY WITH TOTAL KNEE REVISION (Left)  Attending Physician:  Dr. 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 Anemia    hx of with pregnancy   . Anxiety   . Arthritis   . Asthma   . Colon polyps   . Depression   . GERD (gastroesophageal reflux disease)   . Headache    occasional headaches   . High cholesterol   . Hypertension   . Lithium toxicity 06/2019   has since resolved after discontinuing   . PONV (postoperative nausea and vomiting)   . Shortness of breath dyspnea    due to weight gain   . Sleep apnea    has not use CPAP machine in 2 yrs/ DOES NOT KNOW IF SHE NEEDS TO USE C - PAP  . Syncope and collapse    due to severe knee pain     HPI:    [name], 59 y.o. female, has a history of pain and functional disability in the left knee(s) due to inffection / failed previous arthroplasty and patient has failed non-surgical conservative treatments for greater than 12 weeks to include NSAID's and/or analgesics, use of assistive devices and activity modification. The indications for the revision of the total knee arthroplasty are possible infection / pain. Onset of symptoms was gradual starting 4-5 years ago with gradually worsening course since that time.  Prior procedures on the left knee(s) include arthroplasty and revision. Patient currently rates pain in the left knee(s) at 10 out of 10 with activity. There is night pain, worsening of pain with activity and weight bearing, pain  that interferes with activities of daily living, pain with passive range of motion, crepitus and joint swelling.  Patient has evidence of previous TKA by imaging studies. This condition presents safety issues increasing the risk of falls.  Risks, benefits and expectations were discussed with the patient.  Risks including but not limited to the risk of anesthesia, blood clots, nerve damage, blood vessel damage, failure of the prosthesis, infection and up to and including death.  Patient understand the risks, benefits and expectations and wishes to proceed with surgery.  PCP: [name], MD   Discharged Condition: good  Hospital Course:  Patient underwent the above stated procedure on 09/19/2019. Patient tolerated the procedure well and brought to the recovery room in good condition and subsequently to the floor.  POD #1 BP: 138/77 ; Pulse: 83 ; Temp: 97.7 F (36.5 C) ; Resp: 16 Patient reports pain as mild.  Had a good amount of pain yesterday, but already doing better this AM.  Other than pain, no reported events throughout the night. Dr. Charlann Boxer discussed the procedure, findings and expectations moving forward.  Ready to be discharged home. Dorsiflexion/plantar flexion intact, incision: dressing C/D/I, no cellulitis present and compartment soft.   LABS  Basename    HGB     10.2  HCT     31.7    Discharge Exam: General appearance: alert, cooperative and no distress Extremities: Homans sign is negative, no  sign of DVT, no edema, redness or tenderness in the calves or thighs and no ulcers, gangrene or trophic changes  Disposition: Home with follow up in 2 weeks   Follow-up Information    Paralee Cancel, MD. Schedule an appointment as soon as possible for a visit in 2 weeks.   Specialty: Orthopedic Surgery Contact information: [address] [place] [address] [phone]        [facility] Follow up.   Specialty: [place] Why: This is the home  heatlh agency that will be following up with you for PT Contact information: [place] [address] [phone]           Discharge Instructions    Call MD / Call 911   Complete by: As directed    If you experience chest pain or shortness of breath, CALL 911 and be transported to the hospital emergency room.  If you develope a fever above 101 F, pus (white drainage) or increased drainage or redness at the wound, or calf pain, call your surgeon's office.   Change dressing   Complete by: As directed    Maintain surgical dressing until follow up in the clinic. If the edges start to pull up, may reinforce with tape. If the dressing is no longer working, may remove and cover with gauze and tape, but must keep the area dry and clean.  Call with any questions or concerns.   Constipation Prevention   Complete by: As directed    Drink plenty of fluids.  Prune juice may be helpful.  You may use a stool softener, such as Colace (over the counter) 100 mg twice a day.  Use MiraLax (over the counter) for constipation as needed.   Diet - low sodium heart healthy   Complete by: As directed    Discharge instructions   Complete by: As directed    Maintain surgical dressing until follow up in the clinic. If the edges start to pull up, may reinforce with tape. If the dressing is no longer working, may remove and cover with gauze and tape, but must keep the area dry and clean.  Follow up in 2 weeks at [facility]. Call with any questions or concerns.   Increase activity slowly as tolerated   Complete by: As directed    Partial weight bearing with assist device as directed.  50% left leg.   TED hose   Complete by: As directed    Use stockings (TED hose) for 2 weeks on both leg(s).  You may remove them at night for sleeping.      Allergies as of 09/20/2019      Reactions   Adhesive [tape] Other (See Comments)   Peeled off skin   Ceclor [cefaclor] Other (See Comments)   Blisters  on hands   Chocolate Other (See Comments)   migraines   Venlafaxine Other (See Comments)   "refuses to take - makes me feel like I am dying"      Medication List    TAKE these medications   acetaminophen 500 MG tablet Commonly known as: TYLENOL Take 2 tablets (1,000 mg total) by mouth every 8 (eight) hours.   albuterol 108 (90 Base) MCG/ACT inhaler Commonly known as: VENTOLIN HFA Inhale 2 puffs into the lungs every 6 (six) hours as needed for wheezing or shortness of breath.   aspirin 81 MG chewable tablet Commonly known as: Aspirin Childrens Chew 1 tablet (81 mg  total) by mouth 2 (two) times daily. Take for 4 weeks, then resume regular dose.   Breo Ellipta 200-25 MCG/INH Aepb Generic drug: fluticasone furoate-vilanterol Inhale 1 puff into the lungs every morning.   cloNIDine 0.1 MG tablet Commonly known as: CATAPRES Take 1 tablet (0.1 mg total) by mouth 2 (two) times daily.   docusate sodium 100 MG capsule Commonly known as: Colace Take 1 capsule (100 mg total) by mouth 2 (two) times daily.   doxycycline 100 MG capsule Commonly known as: Vibramycin Take 1 capsule (100 mg total) by mouth 2 (two) times daily for 14 days.   ferrous sulfate 325 (65 FE) MG tablet Commonly known as: FerrouSul Take 1 tablet (325 mg total) by mouth 3 (three) times daily with meals for 14 days.   furosemide 20 MG tablet Commonly known as: LASIX Take 1 tablet (20 mg total) by mouth every morning.   levothyroxine 100 MCG tablet Commonly known as: SYNTHROID Take 1 tablet (100 mcg total) by mouth daily before breakfast.   lisinopril 20 MG tablet Commonly known as: ZESTRIL Take 1 tablet (20 mg total) by mouth daily.   methocarbamol 500 MG tablet Commonly known as: Robaxin Take 1 tablet (500 mg total) by mouth every 6 (six) hours as needed for muscle spasms.   montelukast 10 MG tablet Commonly known as: SINGULAIR TAKE 1 TABLET BY MOUTH EVERYDAY AT BEDTIME What changed: See the new  instructions.   Oxycodone HCl 10 MG Tabs Take 1-2 tablets (10-20 mg total) by mouth every 4 (four) hours as needed for moderate pain or severe pain.   polyethylene glycol 17 g packet Commonly known as: MIRALAX / GLYCOLAX Take 17 g by mouth 2 (two) times daily.   pravastatin 40 MG tablet Commonly known as: PRAVACHOL Take 1 tablet (40 mg total) by mouth at bedtime.   promethazine 25 MG tablet Commonly known as: PHENERGAN Take 25 mg by mouth every 6 (six) hours as needed for nausea or vomiting.   rizatriptan 10 MG tablet Commonly known as: MAXALT TAKE ONE TABLET AS NEEDED FOR MIGRAINE FOR UP TO 1 DOSE. MAY REPEAT IN 2 HOURS IF NEEDED   sertraline 50 MG tablet Commonly known as: ZOLOFT TAKE 1 TABLET BY MOUTH EVERYDAY AT BEDTIME   solifenacin 5 MG tablet Commonly known as: VESIcare Take 1 tablet (5 mg total) by mouth daily. For bladder control What changed:   when to take this  reasons to take this  additional instructions   trazodone 300 MG tablet Commonly known as: DESYREL Take 1 tablet (300 mg total) by mouth at bedtime.            Discharge Care Instructions  (From admission, onward)         Start     Ordered   09/20/19 0000  Change dressing    Comments: Maintain surgical dressing until follow up in the clinic. If the edges start to pull up, may reinforce with tape. If the dressing is no longer working, may remove and cover with gauze and tape, but must keep the area dry and clean.  Call with any questions or concerns.   09/20/19 0849           Signed: Anastasio Auerbach. Shina Wass   PA-C  09/24/2019, 8:29 AM

## 2019-09-24 NOTE — Anesthesia Postprocedure Evaluation (Signed)
Anesthesia Post Note  Patient: [name]  Procedure(s) Performed: EXCISIONAL  LEFT TOTAL KNEE ARTHROPLASTY WITH TOTAL KNEE REVISION (Left Knee)     Patient location during evaluation: PACU Anesthesia Type: General Level of consciousness: sedated Pain management: pain level controlled Vital Signs Assessment: post-procedure vital signs reviewed and stable Respiratory status: spontaneous breathing Cardiovascular status: stable Postop Assessment: no apparent nausea or vomiting Anesthetic complications: no    Last Vitals:  Vitals:   09/20/19 0838 09/20/19 0908  BP:  129/63  Pulse:  82  Resp:  14  Temp:  36.4 C  SpO2: 98% 94%    Last Pain:  Vitals:   09/20/19 1320  TempSrc:   PainSc: 8    Pain Goal: Patients Stated Pain Goal: 3 (09/20/19 1320)                 Caren Macadam

## 2019-09-28 ENCOUNTER — Other Ambulatory Visit: Payer: Self-pay | Admitting: Family Medicine

## 2019-10-02 ENCOUNTER — Other Ambulatory Visit: Payer: Self-pay | Admitting: *Deleted

## 2019-10-02 DIAGNOSIS — M25562 Pain in left knee: Secondary | ICD-10-CM

## 2019-10-02 DIAGNOSIS — G8929 Other chronic pain: Secondary | ICD-10-CM

## 2019-10-15 ENCOUNTER — Encounter: Payer: Self-pay | Admitting: Family Medicine

## 2019-10-15 ENCOUNTER — Other Ambulatory Visit: Payer: Self-pay

## 2019-10-15 ENCOUNTER — Ambulatory Visit (INDEPENDENT_AMBULATORY_CARE_PROVIDER_SITE_OTHER): Payer: BC Managed Care – PPO | Admitting: Family Medicine

## 2019-10-15 VITALS — BP 132/83 | HR 84 | Temp 98.5°F | Ht 63.0 in | Wt 300.2 lb

## 2019-10-15 DIAGNOSIS — R110 Nausea: Secondary | ICD-10-CM

## 2019-10-15 DIAGNOSIS — F5104 Psychophysiologic insomnia: Secondary | ICD-10-CM

## 2019-10-15 MED ORDER — PROMETHAZINE HCL 25 MG PO TABS: 25.0000 mg | ORAL_TABLET | Freq: Four times a day (QID) | ORAL | 1 refills | Status: DC | PRN

## 2019-10-15 NOTE — Progress Notes (Signed)
Subjective:  Patient ID: [name], female    DOB: [birth year]  Age: 59 y.o. MRN: [id number]  CC: Follow-up (6 week)   HPI [name]  presents for follow-up on insomnia.  She is sleeping far better now.  She is not using Ambien.  She is using trazodone as noted below 150 mg 2 at bedtime.  She has no complaints with regard to thyroid at this time.  Currently she is taking hydrocodone under the direction of Asencion Partridge, PA-C of [facility].  PDMP review shows appropriate compliance  Depression screen [facility] 2/9 10/15/2019 09/03/2019 08/20/2019  Decreased Interest 0 0 0  Down, Depressed, Hopeless 0 0 0  PHQ - 2 Score 0 0 0  Altered sleeping - - -  Tired, decreased energy - - -  Change in appetite - - -  Feeling bad or failure about yourself  - - -  Trouble concentrating - - -  Moving slowly or fidgety/restless - - -  Suicidal thoughts - - -  PHQ-9 Score - - -  Difficult doing work/chores - - -    History Deniesha has a past medical history of Anemia, Anxiety, Arthritis, Asthma, Colon polyps, Depression, GERD (gastroesophageal reflux disease), Headache, High cholesterol, Hypertension, Lithium toxicity (06/2019), PONV (postoperative nausea and vomiting), Shortness of breath dyspnea, Sleep apnea, and Syncope and collapse.   She has a past surgical history that includes Endometrial ablation w/ novasure; Carpal tunnel release (2007); trigger finger release surgeyr ; Tubal ligation; Knee arthroscopy; Knee arthroscopy with medial menisectomy (Right, 10/22/2013); Back surgery (2012); Colonoscopy; Excision metacarpal mass (Right, 03/10/2014); Total knee arthroplasty (Left, 04/16/2014); Irrigation and debridement knee (Left, 05/23/2014); Total knee arthroplasty (Right, 12/23/2014); and Excisional total knee arthroplasty with antibiotic spacers (Left, 09/19/2019).   Her family history includes Alcohol abuse in her mother; Arthritis in her mother; Cancer in her brother, daughter, and maternal  grandmother; Gout in her father; Heart disease in her mother; Heart disease (age of onset: 25) in her father; Hyperlipidemia in her father; Hypertension in her father and sister.She reports that she has never smoked. She has never used smokeless tobacco. She reports that she does not drink alcohol or use drugs.    ROS Review of Systems  Constitutional: Negative.   HENT: Negative.   Eyes: Negative for visual disturbance.  Respiratory: Negative for shortness of breath.   Cardiovascular: Negative for chest pain.  Gastrointestinal: Negative for abdominal pain.  Musculoskeletal: Negative for arthralgias.    Objective:  BP 132/83   Pulse 84   Temp 98.5 F (36.9 C) (Temporal)   Ht 5\' 3"  (1.6 m)   Wt (!) 300 lb 3.2 oz (136.2 kg)   BMI 53.18 kg/m   BP Readings from Last 3 Encounters:  10/15/19 132/83  09/20/19 129/63  09/11/19 (!) 149/98    Wt Readings from Last 3 Encounters:  10/15/19 (!) 300 lb 3.2 oz (136.2 kg)  09/19/19 (!) 370 lb 2 oz (167.9 kg)  09/11/19 (!) 370 lb 2 oz (167.9 kg)     Physical Exam Constitutional:      General: She is not in acute distress.    Appearance: She is well-developed.  Cardiovascular:     Rate and Rhythm: Normal rate and regular rhythm.  Pulmonary:     Breath sounds: Normal breath sounds.  Skin:    General: Skin is warm and dry.  Neurological:     Mental Status: She is alert and oriented to person, place, and time.  Assessment & Plan:   Arienne was seen today for follow-up.  Diagnoses and all orders for this visit:  Chronic insomnia  Nausea -     promethazine (PHENERGAN) 25 MG tablet; Take 1 tablet (25 mg total) by mouth every 6 (six) hours as needed. for nausea       I have discontinued Svetlana Bagby. Afzal's Oxycodone HCl. I have also changed her promethazine. Additionally, I am having her maintain her lisinopril, albuterol, cloNIDine, solifenacin, furosemide, Breo Ellipta, levothyroxine, sertraline, trazodone,  rizatriptan, aspirin, ferrous sulfate, docusate sodium, polyethylene glycol, methocarbamol, acetaminophen, montelukast, pravastatin, HYDROcodone-acetaminophen, Duexis, and traMADol.  Allergies as of 10/15/2019      Reactions   Adhesive [tape] Other (See Comments)   Peeled off skin   Ceclor [cefaclor] Other (See Comments)   Blisters on hands   Chocolate Other (See Comments)   migraines   Venlafaxine Other (See Comments)   "refuses to take - makes me feel like I am dying"      Medication List       Accurate as of October 15, 2019 11:59 PM. If you have any questions, ask your nurse or doctor.        STOP taking these medications   Oxycodone HCl 10 MG Tabs Stopped by: [name], MD     TAKE these medications   acetaminophen 500 MG tablet Commonly known as: TYLENOL Take 2 tablets (1,000 mg total) by mouth every 8 (eight) hours.   albuterol 108 (90 Base) MCG/ACT inhaler Commonly known as: VENTOLIN HFA Inhale 2 puffs into the lungs every 6 (six) hours as needed for wheezing or shortness of breath.   aspirin 81 MG chewable tablet Commonly known as: Aspirin Childrens Chew 1 tablet (81 mg total) by mouth 2 (two) times daily. Take for 4 weeks, then resume regular dose.   Breo Ellipta 200-25 MCG/INH Aepb Generic drug: fluticasone furoate-vilanterol Inhale 1 puff into the lungs every morning.   cloNIDine 0.1 MG tablet Commonly known as: CATAPRES Take 1 tablet (0.1 mg total) by mouth 2 (two) times daily.   docusate sodium 100 MG capsule Commonly known as: Colace Take 1 capsule (100 mg total) by mouth 2 (two) times daily.   Duexis 800-26.6 MG Tabs Generic drug: Ibuprofen-Famotidine Take 1 tablet by mouth 3 (three) times daily.   ferrous sulfate 325 (65 FE) MG tablet Commonly known as: FerrouSul Take 1 tablet (325 mg total) by mouth 3 (three) times daily with meals for 14 days.   furosemide 20 MG tablet Commonly known as: LASIX Take 1 tablet (20 mg total) by mouth every  morning.   HYDROcodone-acetaminophen 7.5-325 MG tablet Commonly known as: NORCO   levothyroxine 100 MCG tablet Commonly known as: SYNTHROID Take 1 tablet (100 mcg total) by mouth daily before breakfast.   lisinopril 20 MG tablet Commonly known as: ZESTRIL Take 1 tablet (20 mg total) by mouth daily.   methocarbamol 500 MG tablet Commonly known as: Robaxin Take 1 tablet (500 mg total) by mouth every 6 (six) hours as needed for muscle spasms.   montelukast 10 MG tablet Commonly known as: SINGULAIR TAKE 1 TABLET BY MOUTH EVERYDAY AT BEDTIME   polyethylene glycol 17 g packet Commonly known as: MIRALAX / GLYCOLAX Take 17 g by mouth 2 (two) times daily.   pravastatin 40 MG tablet Commonly known as: PRAVACHOL TAKE 1 TABLET BY MOUTH EVERYDAY AT BEDTIME   promethazine 25 MG tablet Commonly known as: PHENERGAN Take 1 tablet (25 mg total) by mouth every  6 (six) hours as needed. for nausea   rizatriptan 10 MG tablet Commonly known as: MAXALT TAKE ONE TABLET AS NEEDED FOR MIGRAINE FOR UP TO 1 DOSE. MAY REPEAT IN 2 HOURS IF NEEDED   sertraline 50 MG tablet Commonly known as: ZOLOFT TAKE 1 TABLET BY MOUTH EVERYDAY AT BEDTIME   solifenacin 5 MG tablet Commonly known as: VESIcare Take 1 tablet (5 mg total) by mouth daily. For bladder control What changed:   when to take this  reasons to take this  additional instructions   traMADol 50 MG tablet Commonly known as: ULTRAM tramadol 50 mg tablet   trazodone 300 MG tablet Commonly known as: DESYREL Take 1 tablet (300 mg total) by mouth at bedtime.        Follow-up: No follow-ups on file.  [name], M.D.

## 2019-10-16 ENCOUNTER — Encounter: Payer: Self-pay | Admitting: Family Medicine

## 2019-10-21 ENCOUNTER — Other Ambulatory Visit: Payer: Self-pay

## 2019-10-21 ENCOUNTER — Ambulatory Visit: Payer: BC Managed Care – PPO | Attending: Orthopedic Surgery | Admitting: Physical Therapy

## 2019-10-21 ENCOUNTER — Encounter: Payer: Self-pay | Admitting: Physical Therapy

## 2019-10-21 DIAGNOSIS — M25662 Stiffness of left knee, not elsewhere classified: Secondary | ICD-10-CM | POA: Diagnosis present

## 2019-10-21 DIAGNOSIS — R262 Difficulty in walking, not elsewhere classified: Secondary | ICD-10-CM | POA: Diagnosis present

## 2019-10-21 DIAGNOSIS — M25562 Pain in left knee: Secondary | ICD-10-CM | POA: Diagnosis present

## 2019-10-21 NOTE — Therapy (Signed)
[facility] [address] [place], [place], [address] Phone: [phone]   Fax:  [phone]  Physical Therapy Evaluation  Patient Details  Name: Morgan GaulMarie S Nesser MRN: [id number] Date of Birth: [birth year] Referring Provider (PT): Lanney GinsMatthew Babish, PA-C   Encounter Date: 10/21/2019  PT End of Session - 10/21/19 1937    Visit Number  1    Number of Visits  18    Date for PT Re-Evaluation  12/09/19    Authorization Type  FOTO, Progress note every 10th visit, KX modifier at 15th visit    PT Start Time  1300    PT Stop Time  1343    PT Time Calculation (min)  43 min    Equipment Utilized During Treatment  Other (comment)   Quad cane   Activity Tolerance  Patient tolerated treatment well    Behavior During Therapy  [facility] for tasks assessed/performed       Past Medical History:  Diagnosis Date  . Anemia    hx of with pregnancy   . Anxiety   . Arthritis   . Asthma   . Colon polyps   . Depression   . GERD (gastroesophageal reflux disease)   . Headache    occasional headaches   . High cholesterol   . Hypertension   . Lithium toxicity 06/2019   has since resolved after discontinuing   . PONV (postoperative nausea and vomiting)   . Shortness of breath dyspnea    due to weight gain   . Sleep apnea    has not use CPAP machine in 2 yrs/ DOES NOT KNOW IF SHE NEEDS TO USE C - PAP  . Syncope and collapse    due to severe knee pain     Past Surgical History:  Procedure Laterality Date  . BACK SURGERY  2012   lumb fusion  . CARPAL TUNNEL RELEASE  2007   both hands  . COLONOSCOPY    . ENDOMETRIAL ABLATION W/ NOVASURE    . EXCISION METACARPAL MASS Right 03/10/2014   Procedure: EXCISION MASS RIGHT HAND FIRST WEB SPACE DORSAL PALMAR INCISION ;  Surgeon: Cindee SaltGary Kuzma, MD;  Location: [facility];  Service: Orthopedics;  Laterality: Right;  . EXCISIONAL TOTAL KNEE ARTHROPLASTY WITH ANTIBIOTIC SPACERS Left 09/19/2019   Procedure:  EXCISIONAL  LEFT TOTAL KNEE ARTHROPLASTY WITH TOTAL KNEE REVISION;  Surgeon: Durene Romanslin, Matthew, MD;  Location: [facility];  Service: Orthopedics;  Laterality: Left;  2 hrs  . IRRIGATION AND DEBRIDEMENT KNEE Left 05/23/2014   Procedure: IRRIGATION AND DEBRIDEMENT KNEE;  Surgeon: Shelda PalMatthew D Olin, MD;  Location: [facility];  Service: Orthopedics;  Laterality: Left;  With POLYETHYLENE EXCHANGE  . KNEE ARTHROSCOPY     let knee 03/2013   . KNEE ARTHROSCOPY WITH MEDIAL MENISECTOMY Right 10/22/2013   Procedure: RIGHT KNEE ARTHROSCOPY WITH MEDIAL MENISECTOMY, abrasion chrondrplasty Brigid Re/CLEAN OUT/CORRECT CURETTEMENT/BONE GRAFT/PROXIMAL TIBIA ;  Surgeon: Jacki Conesonald A Gioffre, MD;  Location: [facility];  Service: Orthopedics;  Laterality: Right;  . TOTAL KNEE ARTHROPLASTY Left 04/16/2014   Procedure: LEFT TOTAL KNEE ARTHROPLASTY;  Surgeon: Jacki Conesonald A Gioffre, MD;  Location: [facility];  Service: Orthopedics;  Laterality: Left;  . TOTAL KNEE ARTHROPLASTY Right 12/23/2014   Procedure: TOTAL KNEE ARTHROPLASTY;  Surgeon: Ranee Gosselinonald Gioffre, MD;  Location: [facility];  Service: Orthopedics;  Laterality: Right;  . trigger finger release surgeyr      bilateral   . TUBAL LIGATION      There were no vitals filed  for this visit.   Subjective Assessment - 10/21/19 1930    Subjective  COVID-19 screening performed upon arrival.Patient arrives to physical therapy with reports of left knee pain, difficulty walking and decreased strength due to a left knee TKA revision on 09/19/2019. Patient reports having home health PT but has not been compliant with HEP provided. Patient reports ambulating around her home with a walker but when she is out in the community she ambulates with a large base quad care for ease. Patient requires intermittent assistance from husband for ADLs, dressing, and home activities. Patient reports pain at worst as 8/10 and pain at best as 0/10. Patient's goals are to decrease pain, improve movement, improve strength, and improve ability to  preform ADLs and home activities.    Pertinent History  left TKA revision 09/19/2019, history of lumbar surgery, depression, arthritis, GERD, Asthma    Limitations  Sitting;Standing;Walking;House hold activities    How long can you sit comfortably?  ~5 mins    How long can you stand comfortably?  10 mins    How long can you walk comfortably?  room to room    Patient Stated Goals  Improve function.    Currently in Pain?  Yes    Pain Score  4     Pain Location  Knee    Pain Orientation  Left    Pain Descriptors / Indicators  Aching;Throbbing    Pain Type  Surgical pain    Pain Onset  More than a month ago    Pain Frequency  Constant    Aggravating Factors   "over use"    Pain Relieving Factors  Ice & pain meds    Effect of Pain on Daily Activities  unable to perform all ADLs         [facility] PT Assessment - 10/21/19 0001      Assessment   Medical Diagnosis  Presence of left artificial knee joint; left total knee revision    Referring Provider (PT)  Lanney Gins, PA-C    Onset Date/Surgical Date  09/19/19    Next MD Visit  April 8. 2021    Prior Therapy  yes      Precautions   Precautions  Knee    Precaution Comments  Partial WB per referral      Restrictions   Weight Bearing Restrictions  Yes    LLE Weight Bearing  Partial weight bearing      Balance Screen   Has the patient fallen in the past 6 months  Yes    How many times?  1 (prior to surgery)    Has the patient had a decrease in activity level because of a fear of falling?   Yes    Is the patient reluctant to leave their home because of a fear of falling?   Yes      Home Environment   Living Environment  Private residence    Living Arrangements  Spouse/significant other    Type of Home  House      Prior Function   Level of Independence  Needs assistance with ADLs;Needs assistance with homemaking;Independent with household mobility with device    Comments  husband assists      Observation/Other Assessments    Focus on Therapeutic Outcomes (FOTO)   53% limitation      Observation/Other Assessments-Edema    Edema  Circumferential      Circumferential Edema   Circumferential - Right  48 cm at mid patella  Circumferential - Left   48 cm at mid patella      ROM / Strength   AROM / PROM / Strength  AROM;PROM      AROM   Overall AROM   Deficits    AROM Assessment Site  Knee    Right/Left Knee  Left    Left Knee Extension  14    Left Knee Flexion  115      PROM   Overall PROM   Deficits    PROM Assessment Site  Knee    Right/Left Knee  Left    Left Knee Extension  11    Left Knee Flexion  119      Palpation   Patella mobility  decreased superior and inferior patella mobilization      Transfers   Transfers  Independent with all Transfers      Ambulation/Gait   Assistive device  Small based quad cane    Gait Pattern  Step-through pattern;Decreased stride length;Decreased stance time - left;Decreased step length - right;Decreased hip/knee flexion - left;Decreased weight shift to left;Left flexed knee in stance    Ambulation Surface  Level;Indoor    Gait Comments  Does not follow PWB protocol; ambuated holding AD                Objective measurements completed on examination: See above findings.      [facility] Adult PT Treatment/Exercise - 10/21/19 0001      Modalities   Modalities  Vasopneumatic      Vasopneumatic   Number Minutes Vasopneumatic   10 minutes    Vasopnuematic Location   Knee    Vasopneumatic Pressure  Low    Vasopneumatic Temperature   42             PT Education - 10/21/19 1936    Education Details  heel prop, quad set, hamstring stretch    Person(s) Educated  Patient    Methods  Explanation;Demonstration;Handout    Comprehension  Verbalized understanding          PT Long Term Goals - 10/21/19 1951      PT LONG TERM GOAL #1   Title  Patient will be independent with HEP.    Time  6    Period  Weeks    Status  New      PT LONG  TERM GOAL #2   Title  Patient will demonstrate 5 degrees or less of left knee extension to improve gait mechanics.    Time  6    Period  Weeks    Status  New      PT LONG TERM GOAL #3   Title  Patient willl demonstrate 4+/5 left knee MMT in all planes to improve stability during functional tasks.    Time  6    Period  Weeks    Status  New      PT LONG TERM GOAL #4   Title  Patient will report ability to perform ADLs with left knee pain less than or equal to 2/10.    Time  6    Period  Weeks    Status  New      PT LONG TERM GOAL #5   Title  Patient will report ability to ambulate community distances with no AD and left knee pain less than or equal to 3/10.    Time  6    Period  Weeks    Status  New  Plan - 10/21/19 2005    Clinical Impression Statement  Patient 59 year old female who presents to physical therapy with left knee pain, decreased left knee flexion, and difficulty walking secondary to a left TKA revision on 09/19/2019. Patient ambulates holding a large base quad cane with WBAT despite partial weight bearing status. Patient and PT discussed the importance of WB status and how to determine the appropriate weight to apply to which patient reported understanding however patient still demonstrated WBAT ambulation at end of evaluation. Patient and PT discussed plan of care and importance of HEP to maximize PT benefit. Patient would benefit from skilled physical therapy to address deficits and goals.    Personal Factors and Comorbidities  Comorbidity 3+    Comorbidities  left TKA revision 09/19/2019, history of lumbar surgery, depression, arthritis, GERD, Asthma    Examination-Activity Limitations  Bed Mobility;Locomotion Level;Transfers;Stand    Stability/Clinical Decision Making  Stable/Uncomplicated    Clinical Decision Making  Low    Rehab Potential  Good    PT Frequency  3x / week    PT Duration  6 weeks    PT Treatment/Interventions  ADLs/Self Care Home  Management;Electrical Stimulation;Cryotherapy;Iontophoresis 4mg /ml Dexamethasone;Moist Heat;Neuromuscular re-education;Balance training;Therapeutic exercise;Therapeutic activities;Functional mobility training;Gait training;Stair training;Vasopneumatic Device;Passive range of motion;Manual techniques;Patient/family education    PT Next Visit Plan  Review partial weight bearing with scale and gait train,  nustep, knee extension TEs, modaliites PRN for pain relief.    PT Home Exercise Plan  see patient education section    Consulted and Agree with Plan of Care  Patient       Patient will benefit from skilled therapeutic intervention in order to improve the following deficits and impairments:  Decreased activity tolerance, Decreased balance, Decreased strength, Difficulty walking, Increased edema, Decreased range of motion, Pain, Decreased safety awareness, Decreased knowledge of precautions  Visit Diagnosis: Acute pain of left knee - Plan: PT plan of care cert/re-cert  Stiffness of left knee, not elsewhere classified - Plan: PT plan of care cert/re-cert  Difficulty in walking, not elsewhere classified - Plan: PT plan of care cert/re-cert     Problem List Patient Active Problem List   Diagnosis Date Noted  . S/P left TKA revision 09/19/2019  . Acquired absence of knee joint following explantation of joint prosthesis with presence of antibiotic-impregnated cement spacer 09/19/2019  . Overactive bladder 08/20/2019  . Primary insomnia 08/20/2019  . Hypothyroidism 06/05/2019  . Moderate persistent asthma with acute exacerbation 04/03/2019  . Bipolar disorder, in partial remission, most recent episode mixed ([place]) 04/03/2019  . Lumbar radiculopathy 04/03/2019  . Internal derangement of multiple sites of knee 04/03/2019  . Morbid obesity with BMI of 50.0-59.9, adult ([place]) 01/19/2016  . History of total knee arthroplasty 12/23/2014  . Borderline diabetes 10/21/2014  . Transaminasemia 10/21/2014   . Infection of total left knee replacement ([place]) 05/23/2014  . Total knee replacement status 04/16/2014  . Aneurysmal bone cyst, 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  . Osteoarthritis of right knee 10/22/2013  . Complex tear of medial meniscus of right knee as current injury 10/22/2013  . Chronic insomnia 10/27/2012  . Asthma, mild intermittent 10/26/2012  . Osteoarthritis 10/26/2012  . Personal history of colonic polyps 10/26/2012  . GERD (gastroesophageal reflux disease) 10/26/2012  . Essential hypertension, benign 10/26/2012  . Shortness of breath 10/02/2012  . Precordial pain 10/02/2012  . Depression   . High cholesterol   . Migraines     Gabriela Eves, PT, DPT 10/21/2019, 8:29 PM  [facility] [address] [place], [place], [address] Phone: [phone]   Fax:  [phone]  Name: ELDRED LIEVANOS MRN: [id number] Date of Birth: 27-Dec-1960

## 2019-10-24 ENCOUNTER — Ambulatory Visit: Payer: Medicare Other | Admitting: Physical Therapy

## 2019-10-28 ENCOUNTER — Encounter: Payer: Self-pay | Admitting: Family Medicine

## 2019-10-28 ENCOUNTER — Ambulatory Visit: Payer: Medicare Other | Admitting: Physical Therapy

## 2019-10-28 ENCOUNTER — Ambulatory Visit (INDEPENDENT_AMBULATORY_CARE_PROVIDER_SITE_OTHER): Payer: BC Managed Care – PPO | Admitting: Family Medicine

## 2019-10-28 DIAGNOSIS — R112 Nausea with vomiting, unspecified: Secondary | ICD-10-CM

## 2019-10-28 MED ORDER — ONDANSETRON 8 MG PO TBDP: 8.0000 mg | ORAL_TABLET | Freq: Four times a day (QID) | ORAL | 1 refills | Status: DC | PRN

## 2019-10-28 NOTE — Progress Notes (Signed)
Subjective:    Patient ID: [name], female    DOB: 1960-10-22, 59 y.o.   MRN: [id number]   HPI: [name] is a 59 y.o. female presenting for bouts of nausea. Using promethazine with god relief until today. When she took it today it didn't help.Feels it is the doxycycline. Taking it for prevention. Had a wound infection in the past after a surgery.    Depression screen [facility] 2/9 10/15/2019 09/03/2019 08/20/2019 07/15/2019 07/09/2019  Decreased Interest 0 0 0 0 0  Down, Depressed, Hopeless 0 0 0 0 0  PHQ - 2 Score 0 0 0 0 0  Altered sleeping - - - - -  Tired, decreased energy - - - - -  Change in appetite - - - - -  Feeling bad or failure about yourself  - - - - -  Trouble concentrating - - - - -  Moving slowly or fidgety/restless - - - - -  Suicidal thoughts - - - - -  PHQ-9 Score - - - - -  Difficult doing work/chores - - - - -     Relevant past medical, surgical, family and social history reviewed and updated as indicated.  Interim medical history since our last visit reviewed. Allergies and medications reviewed and updated.  ROS:  Review of Systems   Social History   Tobacco Use  Smoking Status Never Smoker  Smokeless Tobacco Never Used       Objective:     Wt Readings from Last 3 Encounters:  10/15/19 (!) 300 lb 3.2 oz (136.2 kg)  09/19/19 (!) 370 lb 2 oz (167.9 kg)  09/11/19 (!) 370 lb 2 oz (167.9 kg)     Exam deferred. Pt. Harboring due to COVID 19. Phone visit performed.   Assessment & Plan:   1. Non-intractable vomiting with nausea, unspecified vomiting type     Meds ordered this encounter  Medications  . ondansetron (ZOFRAN-ODT) 8 MG disintegrating tablet    Sig: Take 1 tablet (8 mg total) by mouth every 6 (six) hours as needed for nausea or vomiting.    Dispense:  20 tablet    Refill:  1    No orders of the defined types were placed in this encounter.   DC doxycycline. Has been taking for prevention for 5.5 weeks.   Diagnoses and  all orders for this visit:  Non-intractable vomiting with nausea, unspecified vomiting type  Other orders -     ondansetron (ZOFRAN-ODT) 8 MG disintegrating tablet; Take 1 tablet (8 mg total) by mouth every 6 (six) hours as needed for nausea or vomiting.    Virtual Visit via telephone Note  I discussed the limitations, risks, security and privacy concerns of performing an evaluation and management service by telephone and the availability of in person appointments. The patient was identified with two identifiers. Pt.expressed understanding and agreed to proceed. Pt. Is at home. Dr. Darlyn Read is in his office.  Follow Up Instructions:   I discussed the assessment and treatment plan with the patient. The patient was provided an opportunity to ask questions and all were answered. The patient agreed with the plan and demonstrated an understanding of the instructions.   The patient was advised to call back or seek an in-person evaluation if the symptoms worsen or if the condition fails to improve as anticipated.   Total minutes including chart review and phone contact time: 14   Follow up plan: Return if symptoms worsen or  fail to improve.  [name], MD [place]

## 2019-11-03 ENCOUNTER — Other Ambulatory Visit: Payer: Self-pay | Admitting: Family Medicine

## 2019-11-03 DIAGNOSIS — F331 Major depressive disorder, recurrent, moderate: Secondary | ICD-10-CM

## 2019-11-25 ENCOUNTER — Other Ambulatory Visit: Payer: Self-pay | Admitting: *Deleted

## 2019-11-25 ENCOUNTER — Telehealth: Payer: Self-pay | Admitting: *Deleted

## 2019-11-25 MED ORDER — DUEXIS 800-26.6 MG PO TABS: 1.0000 | ORAL_TABLET | Freq: Three times a day (TID) | ORAL | 1 refills | Status: DC

## 2019-11-25 NOTE — Telephone Encounter (Signed)
PA started today for Duexis 800-26.6MG  tablets Sent to plan  Key: [id number]

## 2019-11-26 NOTE — Telephone Encounter (Signed)
Prior Auth for Duexis-APPROVED till 11/24/20  Pharmacy notified.

## 2019-12-03 ENCOUNTER — Encounter: Payer: Self-pay | Admitting: Family Medicine

## 2019-12-03 ENCOUNTER — Other Ambulatory Visit: Payer: Self-pay

## 2019-12-03 ENCOUNTER — Ambulatory Visit (INDEPENDENT_AMBULATORY_CARE_PROVIDER_SITE_OTHER): Payer: BC Managed Care – PPO | Admitting: Family Medicine

## 2019-12-03 VITALS — BP 137/84 | HR 94 | Temp 97.3°F | Resp 20 | Ht 63.0 in | Wt 300.1 lb

## 2019-12-03 DIAGNOSIS — I10 Essential (primary) hypertension: Secondary | ICD-10-CM

## 2019-12-03 DIAGNOSIS — F5101 Primary insomnia: Secondary | ICD-10-CM

## 2019-12-03 DIAGNOSIS — E039 Hypothyroidism, unspecified: Secondary | ICD-10-CM | POA: Diagnosis not present

## 2019-12-03 DIAGNOSIS — E782 Mixed hyperlipidemia: Secondary | ICD-10-CM | POA: Diagnosis not present

## 2019-12-03 NOTE — Progress Notes (Signed)
Subjective:  Patient ID: [name], female    DOB: 08/31/1960  Age: 59 y.o. MRN: [id number]  CC: No chief complaint on file.   HPI [name] presents for follow-up of elevated cholesterol. Doing well without complaints on current medication. Denies side effects of statin including myalgia and arthralgia and nausea. Also in today for liver function testing. Currently no chest pain, shortness of breath or other cardiovascular related symptoms noted.   Patient presents for follow-up on  thyroid. The patient has a history of hypothyroidism for many years. It has been stable recently. Pt. denies any change in  voice, loss of hair, heat or cold intolerance. Energy level has been adequate to good. Patient denies constipation and diarrhea. No myxedema. Medication is as noted below. Verified that pt is taking it daily on an empty stomach. Well tolerated.   Follow-up of hypertension. Patient has no history of headache chest pain or shortness of breath or recent cough. Patient also denies symptoms of TIA such as numbness weakness lateralizing. Patient checks  blood pressure at home and has not had any elevated readings recently. Patient denies side effects from his medication. States taking it regularly.   Patient is getting some sleep with the trazodone.  She is waking up during the night son is having nightmares.  She is concerned about whether she can go back on the Ambien or not.  She wants to avoid lithium due to her hospitalization for toxicity. History [name] has a past medical history of Anemia, Anxiety, Arthritis, Asthma, Colon polyps, Depression, GERD (gastroesophageal reflux disease), Headache, High cholesterol, Hypertension, Lithium toxicity (06/2019), PONV (postoperative nausea and vomiting), Shortness of breath dyspnea, Sleep apnea, and Syncope and collapse.   She has a past surgical history that includes Endometrial ablation w/ novasure; Carpal tunnel release (2007); trigger finger  release surgeyr ; Tubal ligation; Knee arthroscopy; Knee arthroscopy with medial menisectomy (Right, 10/22/2013); Back surgery (2012); Colonoscopy; Excision metacarpal mass (Right, 03/10/2014); Total knee arthroplasty (Left, 04/16/2014); Irrigation and debridement knee (Left, 05/23/2014); Total knee arthroplasty (Right, 12/23/2014); and Excisional total knee arthroplasty with antibiotic spacers (Left, 09/19/2019).   Her family history includes Alcohol abuse in her mother; Arthritis in her mother; Cancer in her brother, daughter, and maternal grandmother; Gout in her father; Heart disease in her mother; Heart disease (age of onset: 48) in her father; Hyperlipidemia in her father; Hypertension in her father and sister.She reports that she has never smoked. She has never used smokeless tobacco. She reports that she does not drink alcohol or use drugs.  Current Outpatient Medications on File Prior to Visit  Medication Sig Dispense Refill  . acetaminophen (TYLENOL) 500 MG tablet Take 2 tablets (1,000 mg total) by mouth every 8 (eight) hours. 30 tablet 0  . albuterol (VENTOLIN HFA) 108 (90 Base) MCG/ACT inhaler Inhale 2 puffs into the lungs every 6 (six) hours as needed for wheezing or shortness of breath. 18 g 5  . cloNIDine (CATAPRES) 0.1 MG tablet Take 1 tablet (0.1 mg total) by mouth 2 (two) times daily. 60 tablet 2  . docusate sodium (COLACE) 100 MG capsule Take 1 capsule (100 mg total) by mouth 2 (two) times daily. 28 capsule 0  . fluticasone furoate-vilanterol (BREO ELLIPTA) 200-25 MCG/INH AEPB Inhale 1 puff into the lungs every morning. 1 each 11  . furosemide (LASIX) 20 MG tablet Take 1 tablet (20 mg total) by mouth every morning. 90 tablet 1  . HYDROcodone-acetaminophen (NORCO) 7.5-325 MG tablet     .  levothyroxine (SYNTHROID) 100 MCG tablet Take 1 tablet (100 mcg total) by mouth daily before breakfast. 90 tablet 1  . lisinopril (ZESTRIL) 20 MG tablet Take 1 tablet (20 mg total) by mouth daily. 90  tablet 1  . methocarbamol (ROBAXIN) 500 MG tablet Take 1 tablet (500 mg total) by mouth every 6 (six) hours as needed for muscle spasms. 40 tablet 0  . montelukast (SINGULAIR) 10 MG tablet TAKE 1 TABLET BY MOUTH EVERYDAY AT BEDTIME 90 tablet 1  . ondansetron (ZOFRAN-ODT) 8 MG disintegrating tablet Take 1 tablet (8 mg total) by mouth every 6 (six) hours as needed for nausea or vomiting. 20 tablet 1  . polyethylene glycol (MIRALAX / GLYCOLAX) 17 g packet Take 17 g by mouth 2 (two) times daily. 28 packet 0  . pravastatin (PRAVACHOL) 40 MG tablet TAKE 1 TABLET BY MOUTH EVERYDAY AT BEDTIME 90 tablet 1  . promethazine (PHENERGAN) 25 MG tablet Take 1 tablet (25 mg total) by mouth every 6 (six) hours as needed. for nausea 30 tablet 1  . rizatriptan (MAXALT) 10 MG tablet TAKE ONE TABLET AS NEEDED FOR MIGRAINE FOR UP TO 1 DOSE. MAY REPEAT IN 2 HOURS IF NEEDED 30 tablet 2  . sertraline (ZOLOFT) 50 MG tablet TAKE 1 TABLET BY MOUTH EVERYDAY AT BEDTIME 90 tablet 0  . solifenacin (VESICARE) 5 MG tablet Take 1 tablet (5 mg total) by mouth daily. For bladder control (Patient taking differently: Take 5 mg by mouth daily as needed (bladder control). ) 30 tablet 5  . trazodone (DESYREL) 300 MG tablet Take 1 tablet (300 mg total) by mouth at bedtime. 30 tablet 1  . DUEXIS 800-26.6 MG TABS Take 1 tablet by mouth 3 (three) times daily. (Patient not taking: Reported on 12/03/2019) 90 tablet 1  . ferrous sulfate (FERROUSUL) 325 (65 FE) MG tablet Take 1 tablet (325 mg total) by mouth 3 (three) times daily with meals for 14 days. 42 tablet 0  . traMADol (ULTRAM) 50 MG tablet tramadol 50 mg tablet     No current facility-administered medications on file prior to visit.    ROS Review of Systems  Constitutional: Negative.   HENT: Negative.   Eyes: Negative for visual disturbance.  Respiratory: Negative for shortness of breath.   Cardiovascular: Negative for chest pain.  Gastrointestinal: Negative for abdominal pain.   Musculoskeletal: Negative for arthralgias.  Psychiatric/Behavioral: Positive for sleep disturbance.    Objective:  BP 137/84   Pulse 94   Temp (!) 97.3 F (36.3 C) (Temporal)   Resp 20   Ht '5\' 3"'$  (1.6 m)   Wt (!) 300 lb 2 oz (136.1 kg)   SpO2 98%   BMI 53.16 kg/m   BP Readings from Last 3 Encounters:  12/03/19 137/84  10/15/19 132/83  09/20/19 129/63    Wt Readings from Last 3 Encounters:  12/03/19 (!) 300 lb 2 oz (136.1 kg)  10/15/19 (!) 300 lb 3.2 oz (136.2 kg)  09/19/19 (!) 370 lb 2 oz (167.9 kg)     Physical Exam Constitutional:      General: She is not in acute distress.    Appearance: She is well-developed.  Cardiovascular:     Rate and Rhythm: Normal rate and regular rhythm.  Pulmonary:     Breath sounds: Normal breath sounds.  Skin:    General: Skin is warm and dry.  Neurological:     Mental Status: She is alert and oriented to person, place, and time.  Lab Results  Component Value Date   HGBA1C 5.5 08/20/2019   HGBA1C 6.0 04/02/2019   HGBA1C 6.0 06/24/2015    Lab Results  Component Value Date   WBC 13.5 (H) 09/20/2019   HGB 10.2 (L) 09/20/2019   HCT 31.7 (L) 09/20/2019   PLT 237 09/20/2019   GLUCOSE 145 (H) 09/20/2019   CHOL 327 (H) 04/02/2019   TRIG 361 (H) 04/02/2019   HDL 33 (L) 04/02/2019   LDLCALC 218 (H) 04/02/2019   ALT 20 09/11/2019   AST 23 09/11/2019   NA 137 09/20/2019   K 4.6 09/20/2019   CL 104 09/20/2019   CREATININE 0.93 09/20/2019   BUN 21 (H) 09/20/2019   CO2 23 09/20/2019   TSH 1.910 08/20/2019   INR 1.1 09/11/2019   HGBA1C 5.5 08/20/2019    No results found.  Assessment & Plan:   Diagnoses and all orders for this visit:  Primary insomnia -     CBC with Differential/Platelet -     CMP14+EGFR  Hypothyroidism, unspecified type -     CBC with Differential/Platelet -     CMP14+EGFR -     TSH + free T4  Essential hypertension, benign -     CBC with Differential/Platelet -     CMP14+EGFR  Mixed  hyperlipidemia -     CBC with Differential/Platelet -     CMP14+EGFR -     Lipid panel   I am having [name]. Ginther maintain her lisinopril, albuterol, cloNIDine, solifenacin, furosemide, Breo Ellipta, levothyroxine, sertraline, trazodone, rizatriptan, ferrous sulfate, docusate sodium, polyethylene glycol, methocarbamol, acetaminophen, montelukast, pravastatin, HYDROcodone-acetaminophen, traMADol, promethazine, ondansetron, and Duexis.  No orders of the defined types were placed in this encounter.    Follow-up: Return in about 6 months (around 06/04/2020).  [name], M.D.

## 2019-12-08 ENCOUNTER — Other Ambulatory Visit: Payer: Self-pay | Admitting: Family Medicine

## 2019-12-08 DIAGNOSIS — F5101 Primary insomnia: Secondary | ICD-10-CM

## 2019-12-09 ENCOUNTER — Other Ambulatory Visit: Payer: Self-pay | Admitting: Family Medicine

## 2019-12-09 DIAGNOSIS — F331 Major depressive disorder, recurrent, moderate: Secondary | ICD-10-CM

## 2019-12-30 ENCOUNTER — Other Ambulatory Visit: Payer: Self-pay | Admitting: Family Medicine

## 2019-12-30 ENCOUNTER — Ambulatory Visit
Admission: RE | Admit: 2019-12-30 | Discharge: 2019-12-30 | Disposition: A | Discharge status: Home / Self Care | Payer: Medicare Other | Source: Ambulatory Visit | Attending: Allergy | Admitting: Allergy

## 2019-12-30 ENCOUNTER — Other Ambulatory Visit: Payer: Self-pay | Admitting: Allergy

## 2019-12-30 DIAGNOSIS — J4530 Mild persistent asthma, uncomplicated: Secondary | ICD-10-CM

## 2020-01-01 DIAGNOSIS — M7989 Other specified soft tissue disorders: Secondary | ICD-10-CM | POA: Insufficient documentation

## 2020-01-01 DIAGNOSIS — M79641 Pain in right hand: Secondary | ICD-10-CM | POA: Insufficient documentation

## 2020-01-02 ENCOUNTER — Other Ambulatory Visit: Payer: Self-pay | Admitting: Orthopedic Surgery

## 2020-01-02 DIAGNOSIS — M79641 Pain in right hand: Secondary | ICD-10-CM

## 2020-01-09 ENCOUNTER — Other Ambulatory Visit: Payer: Self-pay | Admitting: Family Medicine

## 2020-01-09 DIAGNOSIS — F5101 Primary insomnia: Secondary | ICD-10-CM

## 2020-01-15 ENCOUNTER — Other Ambulatory Visit: Payer: Self-pay | Admitting: *Deleted

## 2020-01-15 MED ORDER — DUEXIS 800-26.6 MG PO TABS: 1.0000 | ORAL_TABLET | Freq: Three times a day (TID) | ORAL | 1 refills | Status: DC

## 2020-01-21 ENCOUNTER — Other Ambulatory Visit: Payer: Self-pay | Admitting: Family Medicine

## 2020-01-21 ENCOUNTER — Ambulatory Visit (INDEPENDENT_AMBULATORY_CARE_PROVIDER_SITE_OTHER): Payer: BC Managed Care – PPO | Admitting: Family

## 2020-01-21 ENCOUNTER — Encounter: Payer: Self-pay | Admitting: Family

## 2020-01-21 DIAGNOSIS — J4520 Mild intermittent asthma, uncomplicated: Secondary | ICD-10-CM | POA: Diagnosis not present

## 2020-01-21 DIAGNOSIS — R110 Nausea: Secondary | ICD-10-CM

## 2020-01-21 DIAGNOSIS — R05 Cough: Secondary | ICD-10-CM

## 2020-01-21 DIAGNOSIS — Z6841 Body Mass Index (BMI) 40.0 and over, adult: Secondary | ICD-10-CM

## 2020-01-21 DIAGNOSIS — K219 Gastro-esophageal reflux disease without esophagitis: Secondary | ICD-10-CM

## 2020-01-21 DIAGNOSIS — R059 Cough, unspecified: Secondary | ICD-10-CM

## 2020-01-21 MED ORDER — BENZONATATE 200 MG PO CAPS: 200.0000 mg | ORAL_CAPSULE | Freq: Three times a day (TID) | ORAL | 1 refills | Status: DC | PRN

## 2020-01-21 MED ORDER — OMEPRAZOLE 40 MG PO CPDR: 40.0000 mg | DELAYED_RELEASE_CAPSULE | Freq: Every day | ORAL | 3 refills | Status: DC

## 2020-01-21 NOTE — Progress Notes (Signed)
Virtual Visit via telephone Note Due to COVID-19 pandemic this visit was conducted virtually. This visit type was conducted due to national recommendations for restrictions regarding the COVID-19 Pandemic (e.g. social distancing, sheltering in place) in an effort to limit this patient's exposure and mitigate transmission in our community. All issues noted in this document were discussed and addressed.  A physical exam was not performed with this format.  I connected with [name] on 01/21/20 at 9:07 AM by telephone and verified that I am speaking with the correct person using two identifiers. [name] is currently located at restaurant  and no one  is currently with her during visit. The provider, Jannifer Rodney, FNP is located in their office at time of visit.  I discussed the limitations, risks, security and privacy concerns of performing an evaluation and management service by telephone and the availability of in person appointments. I also discussed with the patient that there may be a patient responsible charge related to this service. The patient expressed understanding and agreed to proceed.   History and Present Illness:  Pt calls the office today with cough that has been on going for two months. She states she started Prilosec daily that has helped, but has continued. She has asthma and has used her albuterol when she has been coughing and it helps some times and some times not. She has continued her Breo and Singulair daily.  Cough This is a new problem. The current episode started more than 1 month ago. The problem has been waxing and waning. The problem occurs every few minutes. The cough is non-productive. Associated symptoms include a sore throat ("at times") and shortness of breath. Pertinent negatives include no chills, ear congestion, ear pain, fever, headaches, myalgias, nasal congestion, postnasal drip or wheezing. The symptoms are aggravated by lying down.    Gastroesophageal Reflux She complains of choking, coughing, dysphagia, nausea and a sore throat ("at times"). She reports no belching, no early satiety, no globus sensation or no wheezing. This is a recurrent problem. The current episode started more than 1 month ago. She has tried head elevation and a PPI for the symptoms. The treatment provided mild relief.      Review of Systems  Constitutional: Negative for chills and fever.  HENT: Positive for sore throat ("at times"). Negative for ear pain and postnasal drip.   Respiratory: Positive for cough, choking and shortness of breath. Negative for wheezing.   Gastrointestinal: Positive for dysphagia and nausea.  Musculoskeletal: Negative for myalgias.  Neurological: Negative for headaches.     Observations/Objective: No SOB or distress noted, no cough noted   Assessment and Plan: 1. Cough Will increase Prilosec to 40 mg from 20 mg, as it sounds like cough is caused by GERD She is taking Lisinopril, but has been taking for years with no complaints of cough. Cough worse when laying down and after meals. - benzonatate (TESSALON) 200 MG capsule; Take 1 capsule (200 mg total) by mouth 3 (three) times daily as needed.  Dispense: 90 capsule; Refill: 1  2. Gastroesophageal reflux disease, unspecified whether esophagitis present -Diet discussed- Avoid fried, spicy, citrus foods, caffeine and alcohol -Do not eat 2-3 hours before bedtime -Encouraged small frequent meals -Avoid NSAID's -RTO if symptoms worsen or do not improve  - omeprazole (PRILOSEC) 40 MG capsule; Take 1 capsule (40 mg total) by mouth daily.  Dispense: 90 capsule; Refill: 3 - benzonatate (TESSALON) 200 MG capsule; Take 1 capsule (200 mg total)  by mouth 3 (three) times daily as needed.  Dispense: 90 capsule; Refill: 1  3. Morbid obesity with BMI of 50.0-59.9, adult (HCC)  4. Mild intermittent asthma without complication Continue medications    I discussed the assessment  and treatment plan with the patient. The patient was provided an opportunity to ask questions and all were answered. The patient agreed with the plan and demonstrated an understanding of the instructions.   The patient was advised to call back or seek an in-person evaluation if the symptoms worsen or if the condition fails to improve as anticipated.  The above assessment and management plan was discussed with the patient. The patient verbalized understanding of and has agreed to the management plan. Patient is aware to call the clinic if symptoms persist or worsen. Patient is aware when to return to the clinic for a follow-up visit. Patient educated on when it is appropriate to go to the emergency department.   Time call ended:  9:21 AM  I provided 14 minutes of non-face-to-face time during this encounter.    Jannifer Rodney, FNP

## 2020-01-22 NOTE — Telephone Encounter (Signed)
Last rx'd 10/15/19 #30 with 1 refill  Ok to refill

## 2020-01-24 ENCOUNTER — Telehealth: Payer: Self-pay | Admitting: Family Medicine

## 2020-01-24 NOTE — Telephone Encounter (Signed)
Have her hold the lisinopril for 2 weeks to see if cough gets better.

## 2020-01-24 NOTE — Telephone Encounter (Signed)
Pt states she had a tele visit appt earlier this week for her cough. States that the medication is not helping and she was advised if the cough did not get better she may need to be taken off her lisinopril as it can cause a cough. Please advise.

## 2020-01-24 NOTE — Telephone Encounter (Signed)
Patient will expect provider to respond when he returns to work.

## 2020-01-24 NOTE — Telephone Encounter (Signed)
Pt aware of provider feedback and voiced understanding. She will let us know in 2 weeks if she still has the cough.

## 2020-02-10 ENCOUNTER — Other Ambulatory Visit: Payer: Self-pay | Admitting: Family Medicine

## 2020-02-10 DIAGNOSIS — E039 Hypothyroidism, unspecified: Secondary | ICD-10-CM

## 2020-02-10 DIAGNOSIS — N3281 Overactive bladder: Secondary | ICD-10-CM

## 2020-02-25 ENCOUNTER — Other Ambulatory Visit: Payer: Self-pay | Admitting: Family Medicine

## 2020-02-25 DIAGNOSIS — F331 Major depressive disorder, recurrent, moderate: Secondary | ICD-10-CM

## 2020-02-25 NOTE — Telephone Encounter (Signed)
Next OV 06/04/20

## 2020-02-28 ENCOUNTER — Other Ambulatory Visit: Payer: Self-pay | Admitting: Family Medicine

## 2020-03-13 ENCOUNTER — Other Ambulatory Visit: Payer: Self-pay | Admitting: Family Medicine

## 2020-03-13 DIAGNOSIS — R110 Nausea: Secondary | ICD-10-CM

## 2020-03-20 ENCOUNTER — Other Ambulatory Visit: Payer: Self-pay | Admitting: Family Medicine

## 2020-03-25 ENCOUNTER — Other Ambulatory Visit: Payer: BC Managed Care – PPO

## 2020-03-27 ENCOUNTER — Other Ambulatory Visit: Payer: Self-pay | Admitting: Family Medicine

## 2020-03-29 ENCOUNTER — Other Ambulatory Visit: Payer: Self-pay | Admitting: Family

## 2020-03-29 DIAGNOSIS — K219 Gastro-esophageal reflux disease without esophagitis: Secondary | ICD-10-CM

## 2020-03-29 DIAGNOSIS — R059 Cough, unspecified: Secondary | ICD-10-CM

## 2020-04-01 ENCOUNTER — Ambulatory Visit
Admission: RE | Admit: 2020-04-01 | Discharge: 2020-04-01 | Disposition: A | Discharge status: Home / Self Care | Payer: Medicare Other | Source: Ambulatory Visit | Attending: Orthopedic Surgery | Admitting: Orthopedic Surgery

## 2020-04-01 DIAGNOSIS — M79641 Pain in right hand: Secondary | ICD-10-CM

## 2020-04-07 ENCOUNTER — Telehealth: Payer: Self-pay | Admitting: Family Medicine

## 2020-04-08 ENCOUNTER — Ambulatory Visit (INDEPENDENT_AMBULATORY_CARE_PROVIDER_SITE_OTHER): Payer: BC Managed Care – PPO | Admitting: Family Medicine

## 2020-04-08 DIAGNOSIS — E7800 Pure hypercholesterolemia, unspecified: Secondary | ICD-10-CM | POA: Diagnosis not present

## 2020-04-08 DIAGNOSIS — T464X5A Adverse effect of angiotensin-converting-enzyme inhibitors, initial encounter: Secondary | ICD-10-CM

## 2020-04-08 DIAGNOSIS — R252 Cramp and spasm: Secondary | ICD-10-CM | POA: Diagnosis not present

## 2020-04-08 DIAGNOSIS — R05 Cough: Secondary | ICD-10-CM

## 2020-04-08 DIAGNOSIS — I10 Essential (primary) hypertension: Secondary | ICD-10-CM | POA: Diagnosis not present

## 2020-04-08 DIAGNOSIS — R058 Other specified cough: Secondary | ICD-10-CM

## 2020-04-08 MED ORDER — LOSARTAN POTASSIUM 50 MG PO TABS: 50.0000 mg | ORAL_TABLET | Freq: Every day | ORAL | 2 refills | Status: DC

## 2020-04-08 NOTE — Progress Notes (Signed)
Virtual Visit via Telephone Note  I connected with [name] on 04/08/20 at 4:16 PM by telephone and verified that I am speaking with the correct person using two identifiers. [name] is currently located at home and nobody is currently with her during this visit. The provider, Loman Brooklyn, FNP is located in their office at time of visit.  I discussed the limitations, risks, security and privacy concerns of performing an evaluation and management service by telephone and the availability of in person appointments. I also discussed with the patient that there may be a patient responsible charge related to this service. The patient expressed understanding and agreed to proceed.  Subjective: PCP: [name], MD  Chief Complaint  Patient presents with  . Cough   Patient complains of a dry hacking cough.  It does wake her up in the middle of the night.  Previously she was told to hold her lisinopril to see if her cough resolved, which it did.  When I asked why she was taking the lisinopril again she reported because her blood pressure was too high.  Patient is also concerned that every muscle in her body just feels weak and she has been having cramping in her lower extremities.  This has been going on for the past 2 months.   ROS: Per HPI  Current Outpatient Medications:  .  acetaminophen (TYLENOL) 500 MG tablet, Take 2 tablets (1,000 mg total) by mouth every 8 (eight) hours., Disp: 30 tablet, Rfl: 0 .  albuterol (VENTOLIN HFA) 108 (90 Base) MCG/ACT inhaler, Inhale 2 puffs into the lungs every 6 (six) hours as needed for wheezing or shortness of breath., Disp: 18 g, Rfl: 5 .  benzonatate (TESSALON) 200 MG capsule, Take 1 capsule (200 mg total) by mouth 3 (three) times daily as needed., Disp: 90 capsule, Rfl: 1 .  cloNIDine (CATAPRES) 0.1 MG tablet, Take 1 tablet (0.1 mg total) by mouth 2 (two) times daily., Disp: 60 tablet, Rfl: 2 .  docusate sodium (COLACE) 100 MG  capsule, Take 1 capsule (100 mg total) by mouth 2 (two) times daily., Disp: 28 capsule, Rfl: 0 .  DUEXIS 800-26.6 MG TABS, TAKE 1 TABLET BY MOUTH 3 TIMES A DAY, Disp: 90 tablet, Rfl: 0 .  ferrous sulfate (FERROUSUL) 325 (65 FE) MG tablet, Take 1 tablet (325 mg total) by mouth 3 (three) times daily with meals for 14 days., Disp: 42 tablet, Rfl: 0 .  fluticasone furoate-vilanterol (BREO ELLIPTA) 200-25 MCG/INH AEPB, Inhale 1 puff into the lungs every morning., Disp: 1 each, Rfl: 11 .  furosemide (LASIX) 20 MG tablet, Take 1 tablet (20 mg total) by mouth every morning., Disp: 90 tablet, Rfl: 1 .  HYDROcodone-acetaminophen (NORCO) 7.5-325 MG tablet, , Disp: , Rfl:  .  levothyroxine (SYNTHROID) 100 MCG tablet, TAKE 1 TABLET BY MOUTH DAILY BEFORE BREAKFAST., Disp: 90 tablet, Rfl: 1 .  lisinopril (ZESTRIL) 20 MG tablet, TAKE 1 TABLET BY MOUTH EVERY DAY, Disp: 90 tablet, Rfl: 1 .  methocarbamol (ROBAXIN) 500 MG tablet, Take 1 tablet (500 mg total) by mouth every 6 (six) hours as needed for muscle spasms., Disp: 40 tablet, Rfl: 0 .  montelukast (SINGULAIR) 10 MG tablet, TAKE 1 TABLET BY MOUTH EVERYDAY AT BEDTIME, Disp: 90 tablet, Rfl: 1 .  omeprazole (PRILOSEC) 40 MG capsule, Take 1 capsule (40 mg total) by mouth daily., Disp: 90 capsule, Rfl: 3 .  ondansetron (ZOFRAN-ODT) 8 MG disintegrating tablet, Take 1 tablet (8 mg  total) by mouth every 6 (six) hours as needed for nausea or vomiting., Disp: 20 tablet, Rfl: 1 .  polyethylene glycol (MIRALAX / GLYCOLAX) 17 g packet, Take 17 g by mouth 2 (two) times daily., Disp: 28 packet, Rfl: 0 .  pravastatin (PRAVACHOL) 40 MG tablet, TAKE 1 TABLET BY MOUTH EVERYDAY AT BEDTIME, Disp: 90 tablet, Rfl: 0 .  promethazine (PHENERGAN) 25 MG tablet, TAKE 1 TABLET (25 MG TOTAL) BY MOUTH EVERY 6 (SIX) HOURS AS NEEDED. FOR NAUSEA, Disp: 30 tablet, Rfl: 1 .  rizatriptan (MAXALT) 10 MG tablet, TAKE ONE TABLET AS NEEDED FOR MIGRAINE FOR UP TO 1 DOSE. MAY REPEAT IN 2 HOURS IF NEEDED,  Disp: 30 tablet, Rfl: 2 .  sertraline (ZOLOFT) 50 MG tablet, TAKE 1 TABLET BY MOUTH EVERYDAY AT BEDTIME, Disp: 90 tablet, Rfl: 0 .  solifenacin (VESICARE) 5 MG tablet, TAKE 1 TABLET (5 MG TOTAL) BY MOUTH DAILY. FOR BLADDER CONTROL, Disp: 90 tablet, Rfl: 0 .  traMADol (ULTRAM) 50 MG tablet, tramadol 50 mg tablet, Disp: , Rfl:  .  trazodone (DESYREL) 300 MG tablet, TAKE 1 TABLET BY MOUTH EVERYDAY AT BEDTIME, Disp: 90 tablet, Rfl: 1  Allergies  Allergen Reactions  . Adhesive [Tape] Other (See Comments)    Peeled off skin  . Ceclor [Cefaclor] Other (See Comments)    Blisters on hands  . Chocolate Other (See Comments)    migraines  . Venlafaxine Other (See Comments)    "refuses to take - makes me feel like I am dying"   Past Medical History:  Diagnosis Date  . Anemia    hx of with pregnancy   . Anxiety   . Arthritis   . Asthma   . Colon polyps   . Depression   . GERD (gastroesophageal reflux disease)   . Headache    occasional headaches   . High cholesterol   . Hypertension   . Lithium toxicity 06/2019   has since resolved after discontinuing   . PONV (postoperative nausea and vomiting)   . Shortness of breath dyspnea    due to weight gain   . Sleep apnea    has not use CPAP machine in 2 yrs/ DOES NOT KNOW IF SHE NEEDS TO USE C - PAP  . Syncope and collapse    due to severe knee pain     Observations/Objective: A&O  No respiratory distress or wheezing audible over the phone Mood, judgement, and thought processes all WNL  Assessment and Plan: 1. Cough due to ACE inhibitor - Lisinopril D/C'd and added to her list of allergies.  2. Essential hypertension, benign - Lisinopril D/C'd due to cough. Rx'd losartan 50 mg once daily.  Patient advised to monitor blood pressure at home, keep a log, and bring it with her to her next appointment. - losartan (COZAAR) 50 MG tablet; Take 1 tablet (50 mg total) by mouth daily.  Dispense: 30 tablet; Refill: 2  3. Muscle cramping -  Anemia Profile B; Future - CMP14+EGFR; Future - Thyroid Panel With TSH; Future - Magnesium; Future  4. High cholesterol - Ordered lipid panel since we are already doing the rest of her lab work. - Lipid panel; Future   Follow Up Instructions: Return in about 2 weeks (around 04/22/2020) for HTN.  I discussed the assessment and treatment plan with the patient. The patient was provided an opportunity to ask questions and all were answered. The patient agreed with the plan and demonstrated an understanding of the instructions.     The patient was advised to call back or seek an in-person evaluation if the symptoms worsen or if the condition fails to improve as anticipated.  The above assessment and management plan was discussed with the patient. The patient verbalized understanding of and has agreed to the management plan. Patient is aware to call the clinic if symptoms persist or worsen. Patient is aware when to return to the clinic for a follow-up visit. Patient educated on when it is appropriate to go to the emergency department.   Time call ended: 4:26 PM  I provided 12 minutes of non-face-to-face time during this encounter.  Britney Joyce, MSN, APRN, FNP-C [facility] 04/08/20 

## 2020-04-09 ENCOUNTER — Other Ambulatory Visit: Payer: BC Managed Care – PPO

## 2020-04-09 ENCOUNTER — Other Ambulatory Visit: Payer: Self-pay

## 2020-04-10 LAB — ANEMIA PROFILE B
Basophils Absolute: 0.1 10*3/uL (ref 0.0–0.2)
Basos: 1 %
EOS (ABSOLUTE): 0.2 10*3/uL (ref 0.0–0.4)
Eos: 3 %
Ferritin: 22 ng/mL (ref 15–150)
Folate: 2.7 ng/mL — ABNORMAL LOW (ref >3.0)
Hematocrit: 37.4 % (ref 34.0–46.6)
Hemoglobin: 12.2 g/dL (ref 11.1–15.9)
Immature Grans (Abs): 0 10*3/uL (ref 0.0–0.1)
Immature Granulocytes: 1 %
Iron Saturation: 10 % — ABNORMAL LOW (ref 15–55)
Iron: 45 ug/dL (ref 27–159)
Lymphocytes Absolute: 1.6 10*3/uL (ref 0.7–3.1)
Lymphs: 24 %
MCH: 27.3 pg (ref 26.6–33.0)
MCHC: 32.6 g/dL (ref 31.5–35.7)
MCV: 84 fL (ref 79–97)
Monocytes Absolute: 0.4 10*3/uL (ref 0.1–0.9)
Monocytes: 6 %
Neutrophils Absolute: 4.4 10*3/uL (ref 1.4–7.0)
Neutrophils: 65 %
Platelets: 232 10*3/uL (ref 150–450)
RBC: 4.47 x10E6/uL (ref 3.77–5.28)
RDW: 14.4 % (ref 11.7–15.4)
Retic Ct Pct: 2 % (ref 0.6–2.6)
Total Iron Binding Capacity: 429 ug/dL (ref 250–450)
UIBC: 384 ug/dL (ref 131–425)
Vitamin B-12: 317 pg/mL (ref 232–1245)
WBC: 6.7 10*3/uL (ref 3.4–10.8)

## 2020-04-10 LAB — CMP14+EGFR
ALT: 24 IU/L (ref 0–32)
AST: 18 IU/L (ref 0–40)
Albumin/Globulin Ratio: 1.7 (ref 1.2–2.2)
Albumin: 4.4 g/dL (ref 3.8–4.9)
Alkaline Phosphatase: 131 IU/L — ABNORMAL HIGH (ref 44–121)
BUN/Creatinine Ratio: 25 — ABNORMAL HIGH (ref 9–23)
BUN: 25 mg/dL — ABNORMAL HIGH (ref 6–24)
Bilirubin Total: <0.2 mg/dL (ref 0.0–1.2)
CO2: 21 mmol/L (ref 20–29)
Calcium: 9.2 mg/dL (ref 8.7–10.2)
Chloride: 100 mmol/L (ref 96–106)
Creatinine, Ser: 0.99 mg/dL (ref 0.57–1.00)
GFR calc Af Amer: 73 mL/min/{1.73_m2} (ref >59)
GFR calc non Af Amer: 63 mL/min/{1.73_m2} (ref >59)
Globulin, Total: 2.6 g/dL (ref 1.5–4.5)
Glucose: 127 mg/dL — ABNORMAL HIGH (ref 65–99)
Potassium: 4.1 mmol/L (ref 3.5–5.2)
Sodium: 136 mmol/L (ref 134–144)
Total Protein: 7 g/dL (ref 6.0–8.5)

## 2020-04-10 LAB — LIPID PANEL
Chol/HDL Ratio: 5.2 ratio — ABNORMAL HIGH (ref 0.0–4.4)
Cholesterol, Total: 196 mg/dL (ref 100–199)
HDL: 38 mg/dL — ABNORMAL LOW (ref >39)
LDL Chol Calc (NIH): 121 mg/dL — ABNORMAL HIGH (ref 0–99)
Triglycerides: 212 mg/dL — ABNORMAL HIGH (ref 0–149)
VLDL Cholesterol Cal: 37 mg/dL (ref 5–40)

## 2020-04-10 LAB — THYROID PANEL WITH TSH
Free Thyroxine Index: 1.9 (ref 1.2–4.9)
T3 Uptake Ratio: 25 % (ref 24–39)
T4, Total: 7.4 ug/dL (ref 4.5–12.0)
TSH: 1.64 u[IU]/mL (ref 0.450–4.500)

## 2020-04-10 LAB — MAGNESIUM: Magnesium: 2 mg/dL (ref 1.6–2.3)

## 2020-04-11 ENCOUNTER — Encounter: Payer: Self-pay | Admitting: Family Medicine

## 2020-04-14 ENCOUNTER — Telehealth: Payer: Self-pay | Admitting: Family Medicine

## 2020-04-14 NOTE — Telephone Encounter (Signed)
Pt informed of lab results. She knows to start 1mg  folic acid qd.

## 2020-04-19 ENCOUNTER — Other Ambulatory Visit: Payer: Self-pay | Admitting: Family Medicine

## 2020-04-19 DIAGNOSIS — R110 Nausea: Secondary | ICD-10-CM

## 2020-04-22 ENCOUNTER — Other Ambulatory Visit: Payer: Self-pay | Admitting: Family Medicine

## 2020-04-27 ENCOUNTER — Ambulatory Visit (INDEPENDENT_AMBULATORY_CARE_PROVIDER_SITE_OTHER): Payer: BC Managed Care – PPO | Admitting: Family Medicine

## 2020-04-27 ENCOUNTER — Other Ambulatory Visit: Payer: Self-pay

## 2020-04-27 ENCOUNTER — Encounter: Payer: Self-pay | Admitting: Family Medicine

## 2020-04-27 VITALS — BP 150/86 | HR 91 | Temp 97.7°F | Resp 20 | Ht 63.0 in | Wt 343.0 lb

## 2020-04-27 DIAGNOSIS — I10 Essential (primary) hypertension: Secondary | ICD-10-CM | POA: Diagnosis not present

## 2020-04-27 NOTE — Progress Notes (Signed)
Subjective:  Patient ID: [name], female    DOB: [birth year]  Age: 59 y.o. MRN: [id number]  CC: 2 Week Follow-up   HPI HARUE PRIBBLE presents for  follow-up of hypertension. Patient has no history of headache chest pain or shortness of breath.  She has had quite a bit of cough. As result she saw [place] in our practice who took her off of the ACE inhibitor and started her on the ARB medication, losartan.  Ms. Geraci was afraid to start medication because of potential interaction between that and furosemide.  She did not understand and the effect they had on potassium.  Patient also denies symptoms of TIA such as focal numbness or weakness.  As result of not taking his losartan her blood pressure is higher today.  History Maysie has a past medical history of Anemia, Anxiety, Arthritis, Asthma, Colon polyps, Depression, GERD (gastroesophageal reflux disease), Headache, High cholesterol, Hypertension, Lithium toxicity (06/2019), PONV (postoperative nausea and vomiting), Shortness of breath dyspnea, Sleep apnea, and Syncope and collapse.   She has a past surgical history that includes Endometrial ablation w/ novasure; Carpal tunnel release (2007); trigger finger release surgeyr ; Tubal ligation; Knee arthroscopy; Knee arthroscopy with medial menisectomy (Right, 10/22/2013); Back surgery (2012); Colonoscopy; Excision metacarpal mass (Right, 03/10/2014); Total knee arthroplasty (Left, 04/16/2014); Irrigation and debridement knee (Left, 05/23/2014); Total knee arthroplasty (Right, 12/23/2014); and Excisional total knee arthroplasty with antibiotic spacers (Left, 09/19/2019).   Her family history includes Alcohol abuse in her mother; Arthritis in her mother; Cancer in her brother, daughter, and maternal grandmother; Gout in her father; Heart disease in her mother; Heart disease (age of onset: 42) in her father; Hyperlipidemia in her father; Hypertension in her father and sister.She reports that she  has never smoked. She has never used smokeless tobacco. She reports that she does not drink alcohol and does not use drugs.  Current Outpatient Medications on File Prior to Visit  Medication Sig Dispense Refill  . acetaminophen (TYLENOL) 500 MG tablet Take 2 tablets (1,000 mg total) by mouth every 8 (eight) hours. 30 tablet 0  . albuterol (VENTOLIN HFA) 108 (90 Base) MCG/ACT inhaler TAKE 2 PUFFS BY MOUTH EVERY 6 HOURS AS NEEDED FOR WHEEZE OR SHORTNESS OF BREATH 18 each 0  . cloNIDine (CATAPRES) 0.1 MG tablet Take 1 tablet (0.1 mg total) by mouth 2 (two) times daily. 60 tablet 2  . docusate sodium (COLACE) 100 MG capsule Take 1 capsule (100 mg total) by mouth 2 (two) times daily. 28 capsule 0  . DUEXIS 800-26.6 MG TABS TAKE 1 TABLET BY MOUTH 3 TIMES A DAY 90 tablet 0  . fluticasone furoate-vilanterol (BREO ELLIPTA) 200-25 MCG/INH AEPB Inhale 1 puff into the lungs every morning. 1 each 11  . furosemide (LASIX) 20 MG tablet Take 1 tablet (20 mg total) by mouth every morning. 90 tablet 1  . levothyroxine (SYNTHROID) 100 MCG tablet TAKE 1 TABLET BY MOUTH DAILY BEFORE BREAKFAST. 90 tablet 1  . methocarbamol (ROBAXIN) 500 MG tablet Take 1 tablet (500 mg total) by mouth every 6 (six) hours as needed for muscle spasms. 40 tablet 0  . montelukast (SINGULAIR) 10 MG tablet TAKE 1 TABLET BY MOUTH EVERYDAY AT BEDTIME 90 tablet 1  . omeprazole (PRILOSEC) 40 MG capsule Take 1 capsule (40 mg total) by mouth daily. 90 capsule 3  . ondansetron (ZOFRAN-ODT) 8 MG disintegrating tablet Take 1 tablet (8 mg total) by mouth every 6 (six) hours as needed for nausea  or vomiting. 20 tablet 1  . pravastatin (PRAVACHOL) 40 MG tablet TAKE 1 TABLET BY MOUTH EVERYDAY AT BEDTIME 90 tablet 0  . promethazine (PHENERGAN) 25 MG tablet TAKE 1 TABLET (25 MG TOTAL) BY MOUTH EVERY 6 (SIX) HOURS AS NEEDED. FOR NAUSEA 30 tablet 1  . rizatriptan (MAXALT) 10 MG tablet TAKE ONE TABLET AS NEEDED FOR MIGRAINE FOR UP TO 1 DOSE. MAY REPEAT IN 2  HOURS IF NEEDED 30 tablet 2  . sertraline (ZOLOFT) 50 MG tablet TAKE 1 TABLET BY MOUTH EVERYDAY AT BEDTIME 90 tablet 0  . trazodone (DESYREL) 300 MG tablet TAKE 1 TABLET BY MOUTH EVERYDAY AT BEDTIME 90 tablet 1  . benzonatate (TESSALON) 200 MG capsule Take 1 capsule (200 mg total) by mouth 3 (three) times daily as needed. (Patient not taking: Reported on 04/27/2020) 90 capsule 1  . losartan (COZAAR) 50 MG tablet Take 1 tablet (50 mg total) by mouth daily. (Patient not taking: Reported on 04/27/2020) 30 tablet 2  . polyethylene glycol (MIRALAX / GLYCOLAX) 17 g packet Take 17 g by mouth 2 (two) times daily. (Patient not taking: Reported on 04/27/2020) 28 packet 0  . solifenacin (VESICARE) 5 MG tablet TAKE 1 TABLET (5 MG TOTAL) BY MOUTH DAILY. FOR BLADDER CONTROL (Patient not taking: Reported on 04/27/2020) 90 tablet 0  . [DISCONTINUED] promethazine (PHENERGAN) 25 MG tablet Take 1 tablet (25 mg total) by mouth every 6 (six) hours as needed. for nausea 30 tablet 1   No current facility-administered medications on file prior to visit.    ROS Review of Systems  Constitutional: Negative for activity change.  Neurological: Positive for headaches.    Objective:  BP (!) 150/86   Pulse 91   Temp 97.7 F (36.5 C) (Temporal)   Resp 20   Ht 5\' 3"  (1.6 m)   Wt (!) 343 lb (155.6 kg)   BMI 60.76 kg/m   BP Readings from Last 3 Encounters:  04/27/20 (!) 150/86  12/03/19 137/84  10/15/19 132/83    Wt Readings from Last 3 Encounters:  04/27/20 (!) 343 lb (155.6 kg)  12/03/19 (!) 300 lb 2 oz (136.1 kg)  10/15/19 (!) 300 lb 3.2 oz (136.2 kg)     Physical Exam Constitutional:      General: She is not in acute distress.    Appearance: She is well-developed.  Cardiovascular:     Rate and Rhythm: Normal rate and regular rhythm.  Pulmonary:     Breath sounds: Normal breath sounds.  Skin:    General: Skin is warm and dry.  Neurological:     Mental Status: She is alert and oriented to person,  place, and time.       Assessment & Plan:   Najat was seen today for 2 week follow-up.  Diagnoses and all orders for this visit:  Essential hypertension, benign   Allergies as of 04/27/2020      Reactions   Adhesive [tape] Other (See Comments)   Peeled off skin   Ceclor [cefaclor] Other (See Comments)   Blisters on hands   Chocolate Other (See Comments)   migraines   Venlafaxine Other (See Comments)   "refuses to take - makes me feel like I am dying"   Lisinopril Cough      Medication List       Accurate as of April 27, 2020  7:36 PM. If you have any questions, ask your nurse or doctor.        STOP taking these  medications   ferrous sulfate 325 (65 FE) MG tablet Commonly known as: FerrouSul Stopped by: Mechele ClaudeWarren Sayde Lish, MD   HYDROcodone-acetaminophen 7.5-325 MG tablet Commonly known as: NORCO Stopped by: Mechele ClaudeWarren Toy Samarin, MD   traMADol 50 MG tablet Commonly known as: ULTRAM Stopped by: Mechele ClaudeWarren Jarelyn Bambach, MD     TAKE these medications   acetaminophen 500 MG tablet Commonly known as: TYLENOL Take 2 tablets (1,000 mg total) by mouth every 8 (eight) hours.   albuterol 108 (90 Base) MCG/ACT inhaler Commonly known as: VENTOLIN HFA TAKE 2 PUFFS BY MOUTH EVERY 6 HOURS AS NEEDED FOR WHEEZE OR SHORTNESS OF BREATH   benzonatate 200 MG capsule Commonly known as: TESSALON Take 1 capsule (200 mg total) by mouth 3 (three) times daily as needed.   Breo Ellipta 200-25 MCG/INH Aepb Generic drug: fluticasone furoate-vilanterol Inhale 1 puff into the lungs every morning.   cloNIDine 0.1 MG tablet Commonly known as: CATAPRES Take 1 tablet (0.1 mg total) by mouth 2 (two) times daily.   docusate sodium 100 MG capsule Commonly known as: Colace Take 1 capsule (100 mg total) by mouth 2 (two) times daily.   Duexis 800-26.6 MG Tabs Generic drug: Ibuprofen-Famotidine TAKE 1 TABLET BY MOUTH 3 TIMES A DAY   furosemide 20 MG tablet Commonly known as: LASIX Take 1 tablet (20 mg  total) by mouth every morning.   levothyroxine 100 MCG tablet Commonly known as: SYNTHROID TAKE 1 TABLET BY MOUTH DAILY BEFORE BREAKFAST.   losartan 50 MG tablet Commonly known as: COZAAR Take 1 tablet (50 mg total) by mouth daily.   methocarbamol 500 MG tablet Commonly known as: Robaxin Take 1 tablet (500 mg total) by mouth every 6 (six) hours as needed for muscle spasms.   montelukast 10 MG tablet Commonly known as: SINGULAIR TAKE 1 TABLET BY MOUTH EVERYDAY AT BEDTIME   omeprazole 40 MG capsule Commonly known as: PRILOSEC Take 1 capsule (40 mg total) by mouth daily.   ondansetron 8 MG disintegrating tablet Commonly known as: ZOFRAN-ODT Take 1 tablet (8 mg total) by mouth every 6 (six) hours as needed for nausea or vomiting.   polyethylene glycol 17 g packet Commonly known as: MIRALAX / GLYCOLAX Take 17 g by mouth 2 (two) times daily.   pravastatin 40 MG tablet Commonly known as: PRAVACHOL TAKE 1 TABLET BY MOUTH EVERYDAY AT BEDTIME   promethazine 25 MG tablet Commonly known as: PHENERGAN TAKE 1 TABLET (25 MG TOTAL) BY MOUTH EVERY 6 (SIX) HOURS AS NEEDED. FOR NAUSEA   rizatriptan 10 MG tablet Commonly known as: MAXALT TAKE ONE TABLET AS NEEDED FOR MIGRAINE FOR UP TO 1 DOSE. MAY REPEAT IN 2 HOURS IF NEEDED   sertraline 50 MG tablet Commonly known as: ZOLOFT TAKE 1 TABLET BY MOUTH EVERYDAY AT BEDTIME   solifenacin 5 MG tablet Commonly known as: VESICARE TAKE 1 TABLET (5 MG TOTAL) BY MOUTH DAILY. FOR BLADDER CONTROL   trazodone 300 MG tablet Commonly known as: DESYREL TAKE 1 TABLET BY MOUTH EVERYDAY AT BEDTIME       No orders of the defined types were placed in this encounter.   Today the patient was reassured that the losartan was very similar to the lisinopril and would actually doing quite well with the furosemide and that the combination of the 2 medications would help balance the potassium.  She agrees to start the medicine right away.  Hopefully this  will bring her pressure down.  She will be checking it at home and let  me know if there are any problems.  Follow-up: Return in about 3 months (around 07/28/2020).  [name], M.D.

## 2020-04-28 ENCOUNTER — Other Ambulatory Visit: Payer: Self-pay | Admitting: Family

## 2020-04-28 DIAGNOSIS — R059 Cough, unspecified: Secondary | ICD-10-CM

## 2020-04-28 DIAGNOSIS — K219 Gastro-esophageal reflux disease without esophagitis: Secondary | ICD-10-CM

## 2020-05-10 ENCOUNTER — Other Ambulatory Visit: Payer: Self-pay | Admitting: Family Medicine

## 2020-05-10 DIAGNOSIS — N3281 Overactive bladder: Secondary | ICD-10-CM

## 2020-05-25 ENCOUNTER — Other Ambulatory Visit: Payer: Self-pay | Admitting: Family Medicine

## 2020-05-25 DIAGNOSIS — R110 Nausea: Secondary | ICD-10-CM

## 2020-06-01 ENCOUNTER — Other Ambulatory Visit: Payer: Self-pay | Admitting: Family Medicine

## 2020-06-02 ENCOUNTER — Other Ambulatory Visit: Payer: Self-pay | Admitting: Family Medicine

## 2020-06-02 DIAGNOSIS — F331 Major depressive disorder, recurrent, moderate: Secondary | ICD-10-CM

## 2020-06-04 ENCOUNTER — Ambulatory Visit: Payer: BC Managed Care – PPO | Admitting: Family Medicine

## 2020-06-09 ENCOUNTER — Other Ambulatory Visit: Payer: Self-pay | Admitting: Family Medicine

## 2020-06-09 DIAGNOSIS — I10 Essential (primary) hypertension: Secondary | ICD-10-CM

## 2020-06-19 ENCOUNTER — Other Ambulatory Visit: Payer: Self-pay | Admitting: Family Medicine

## 2020-06-25 ENCOUNTER — Other Ambulatory Visit: Payer: Self-pay | Admitting: Family Medicine

## 2020-06-26 ENCOUNTER — Other Ambulatory Visit: Payer: Self-pay | Admitting: Family Medicine

## 2020-07-10 ENCOUNTER — Ambulatory Visit (INDEPENDENT_AMBULATORY_CARE_PROVIDER_SITE_OTHER): Payer: BC Managed Care – PPO

## 2020-07-10 DIAGNOSIS — Z0000 Encounter for general adult medical examination without abnormal findings: Secondary | ICD-10-CM

## 2020-07-10 NOTE — Progress Notes (Signed)
MEDICARE ANNUAL WELLNESS VISIT  07/10/2020  Telephone Visit Disclaimer This Medicare AWV was conducted by telephone due to national recommendations for restrictions regarding the COVID-19 Pandemic (e.g. social distancing).  I verified, using two identifiers, that I am speaking with [name] or their authorized healthcare agent. I discussed the limitations, risks, security, and privacy concerns of performing an evaluation and management service by telephone and the potential availability of an in-person appointment in the future. The patient expressed understanding and agreed to proceed.  Location of Patient: Home Location of Provider (nurse):  [facility]  Subjective:    [name] is a 59 y.o. female patient of Stacks, Broadus John, MD who had a Medicare Annual Wellness Visit today via telephone. [name] is Disabled and lives with their spouse. She has two children. [name] that she is socially active and does interact with friends/family regularly. She is minimally physically active and enjoys watching television and reading her bible.  Patient Care Team: [name], MD as PCP - General (Family Medicine) Cottle, Steva Ready., MD as Attending Physician (Psychiatry) Ranee Gosselin, MD as Consulting Physician (Orthopedic Surgery)  Advanced Directives 07/10/2020 10/21/2019 09/19/2019 09/06/2019 07/09/2019 02/08/2019 12/23/2014  Does Patient Have a Medical Advance Directive? No No No No No No No  Copy of Healthcare Power of Attorney in Chart? - - - - - - -  Would patient like information on creating a medical advance directive? No - Patient declined - No - Patient declined No - Patient declined Yes (ED - Information included in AVS) - No - patient declined information    Hospital Utilization Over the Past 12 Months: # of hospitalizations or ER visits: 1 # of surgeries: 1  Review of Systems    Patient reports that her overall health is unchanged compared to last year.  History  obtained from chart review and the patient  Patient Reported Readings (BP, Pulse, CBG, Weight, etc) none  Pain Assessment Pain : 0-10 Pain Score: 4  Pain Type: Chronic pain Pain Location: Back Pain Orientation: Medial,Lower Pain Descriptors / Indicators: Aching,Constant,Discomfort Pain Onset: More than a month ago Pain Frequency: Constant Pain Relieving Factors: Ibuprofen  Pain Relieving Factors: Ibuprofen  Current Medications & Allergies (verified) Allergies as of 07/10/2020      Reactions   Adhesive [tape] Other (See Comments)   Peeled off skin   Ceclor [cefaclor] Other (See Comments)   Blisters on hands   Chocolate Other (See Comments)   migraines   Venlafaxine Other (See Comments)   "refuses to take - makes me feel like I am dying"   Lisinopril Cough      Medication List       Accurate as of July 10, 2020  9:43 AM. If you have any questions, ask your nurse or doctor.        STOP taking these medications   benzonatate 200 MG capsule Commonly known as: TESSALON   ondansetron 8 MG disintegrating tablet Commonly known as: ZOFRAN-ODT   polyethylene glycol 17 g packet Commonly known as: MIRALAX / GLYCOLAX     TAKE these medications   acetaminophen 500 MG tablet Commonly known as: TYLENOL Take 2 tablets (1,000 mg total) by mouth every 8 (eight) hours.   albuterol 108 (90 Base) MCG/ACT inhaler Commonly known as: VENTOLIN HFA TAKE 2 PUFFS BY MOUTH EVERY 6 HOURS AS NEEDED FOR WHEEZE OR SHORTNESS OF BREATH   Breo Ellipta 200-25 MCG/INH Aepb Generic drug: fluticasone furoate-vilanterol Inhale 1 puff into the  lungs every morning.   cloNIDine 0.1 MG tablet Commonly known as: CATAPRES Take 1 tablet (0.1 mg total) by mouth 2 (two) times daily.   docusate sodium 100 MG capsule Commonly known as: Colace Take 1 capsule (100 mg total) by mouth 2 (two) times daily.   furosemide 20 MG tablet Commonly known as: LASIX Take 1 tablet (20 mg total) by mouth every  morning.   Ibuprofen-Famotidine 800-26.6 MG Tabs TAKE 1 TABLET BY MOUTH 3 TIMES A DAY   levothyroxine 100 MCG tablet Commonly known as: SYNTHROID TAKE 1 TABLET BY MOUTH DAILY BEFORE BREAKFAST.   losartan 50 MG tablet Commonly known as: COZAAR TAKE 1 TABLET BY MOUTH EVERY DAY   methocarbamol 500 MG tablet Commonly known as: Robaxin Take 1 tablet (500 mg total) by mouth every 6 (six) hours as needed for muscle spasms.   montelukast 10 MG tablet Commonly known as: SINGULAIR TAKE 1 TABLET BY MOUTH EVERYDAY AT BEDTIME   omeprazole 40 MG capsule Commonly known as: PRILOSEC Take 1 capsule (40 mg total) by mouth daily.   pravastatin 40 MG tablet Commonly known as: PRAVACHOL TAKE 1 TABLET BY MOUTH EVERYDAY AT BEDTIME   rizatriptan 10 MG tablet Commonly known as: MAXALT TAKE ONE TABLET AS NEEDED FOR MIGRAINE FOR UP TO 1 DOSE. MAY REPEAT IN 2 HOURS IF NEEDED   sertraline 50 MG tablet Commonly known as: ZOLOFT TAKE 1 TABLET BY MOUTH EVERYDAY AT BEDTIME   solifenacin 5 MG tablet Commonly known as: VESICARE TAKE 1 TABLET (5 MG TOTAL) BY MOUTH DAILY. FOR BLADDER CONTROL   trazodone 300 MG tablet Commonly known as: DESYREL TAKE 1 TABLET BY MOUTH EVERYDAY AT BEDTIME       History (reviewed): Past Medical History:  Diagnosis Date  . Anemia    hx of with pregnancy   . Anxiety   . Arthritis   . Asthma   . Colon polyps   . Depression   . GERD (gastroesophageal reflux disease)   . Headache    occasional headaches   . High cholesterol   . Hypertension   . Lithium toxicity 06/2019   has since resolved after discontinuing   . PONV (postoperative nausea and vomiting)   . Shortness of breath dyspnea    due to weight gain   . Sleep apnea    has not use CPAP machine in 2 yrs/ DOES NOT KNOW IF SHE NEEDS TO USE C - PAP  . Syncope and collapse    due to severe knee pain    Past Surgical History:  Procedure Laterality Date  . BACK SURGERY  2012   lumb fusion  . CARPAL  TUNNEL RELEASE  2007   both hands  . COLONOSCOPY    . ENDOMETRIAL ABLATION W/ NOVASURE    . EXCISION METACARPAL MASS Right 03/10/2014   Procedure: EXCISION MASS RIGHT HAND FIRST WEB SPACE DORSAL PALMAR INCISION ;  Surgeon: Cindee SaltGary Kuzma, MD;  Location: [facility];  Service: Orthopedics;  Laterality: Right;  . EXCISIONAL TOTAL KNEE ARTHROPLASTY WITH ANTIBIOTIC SPACERS Left 09/19/2019   Procedure: EXCISIONAL  LEFT TOTAL KNEE ARTHROPLASTY WITH TOTAL KNEE REVISION;  Surgeon: Durene Romanslin, Matthew, MD;  Location: [facility];  Service: Orthopedics;  Laterality: Left;  2 hrs  . IRRIGATION AND DEBRIDEMENT KNEE Left 05/23/2014   Procedure: IRRIGATION AND DEBRIDEMENT KNEE;  Surgeon: Shelda PalMatthew D Olin, MD;  Location: [facility];  Service: Orthopedics;  Laterality: Left;  With POLYETHYLENE EXCHANGE  . KNEE ARTHROSCOPY  let knee 03/2013   . KNEE ARTHROSCOPY WITH MEDIAL MENISECTOMY Right 10/22/2013   Procedure: RIGHT KNEE ARTHROSCOPY WITH MEDIAL MENISECTOMY, abrasion chrondrplasty Brigid Re OUT/CORRECT CURETTEMENT/BONE GRAFT/PROXIMAL TIBIA ;  Surgeon: Jacki Cones, MD;  Location: [facility];  Service: Orthopedics;  Laterality: Right;  . TOTAL KNEE ARTHROPLASTY Left 04/16/2014   Procedure: LEFT TOTAL KNEE ARTHROPLASTY;  Surgeon: Jacki Cones, MD;  Location: [facility];  Service: Orthopedics;  Laterality: Left;  . TOTAL KNEE ARTHROPLASTY Right 12/23/2014   Procedure: TOTAL KNEE ARTHROPLASTY;  Surgeon: Ranee Gosselin, MD;  Location: [facility];  Service: Orthopedics;  Laterality: Right;  . trigger finger release surgeyr      bilateral   . TUBAL LIGATION     Family History  Problem Relation Age of Onset  . Alcohol abuse Mother   . Arthritis Mother   . Heart disease Mother   . Hyperlipidemia Father   . Hypertension Father   . Heart disease Father 92       CABG  . Gout Father   . Hypertension Sister   . Cancer Brother        colon, lung, liver  . Cancer Maternal Grandmother        breast  . Cancer Daughter         NEUROBLASTOMA   Social History   Socioeconomic History  . Marital status: Married    Spouse name: Trey Paula  . Number of children: 2  . Years of education: 23  . Highest education level: Some college, no degree  Occupational History    Comment: Disabled  Tobacco Use  . Smoking status: Never Smoker  . Smokeless tobacco: Never Used  Vaping Use  . Vaping Use: Never used  Substance and Sexual Activity  . Alcohol use: No  . Drug use: No  . Sexual activity: Not on file  Other Topics Concern  . Not on file  Social History Narrative   Lives with her husband who helps with grocery shopping and other transportation needs.  Has two children, Billey Gosling and Clydie Braun and grandchildren.  There are two outside dogs and several  Horses.    Social Determinants of Health   Financial Resource Strain: Not on file  Food Insecurity: Not on file  Transportation Needs: Not on file  Physical Activity: Not on file  Stress: Not on file  Social Connections: Not on file    Activities of Daily Living In your present state of health, do you have any difficulty performing the following activities: 07/10/2020 09/19/2019  Hearing? N -  Vision? N -  Difficulty concentrating or making decisions? N -  Walking or climbing stairs? N -  Dressing or bathing? N -  Doing errands, shopping? N N  Preparing Food and eating ? N -  Using the Toilet? N -  In the past six months, have you accidently leaked urine? N -  Do you have problems with loss of bowel control? N -  Managing your Medications? N -  Managing your Finances? N -  Housekeeping or managing your Housekeeping? N -  Some recent data might be hidden    Patient Education/ Literacy How often do you need to have someone help you when you read instructions, pamphlets, or other written materials from your doctor or pharmacy?: 1 - Never What is the last grade level you completed in school?: 12th grade  Exercise Current Exercise Habits: The patient does not  participate in regular exercise at present, Exercise limited by: orthopedic condition(s)  Diet Patient reports consuming 3 meals a day and 1 snack(s) a day Patient reports that her primary diet is: Regular Patient reports that she does have regular access to food.   Depression Screen PHQ 2/9 Scores 07/10/2020 04/27/2020 12/03/2019 10/15/2019 09/03/2019 08/20/2019 07/15/2019  PHQ - 2 Score 1 4 0 0 0 0 0  PHQ- 9 Score - 12 - - - - -  Exception Documentation - - - - - - -     Fall Risk Fall Risk  10/15/2019 09/03/2019 08/20/2019 07/15/2019 07/09/2019  Falls in the past year? 0 0 1 0 1  Number falls in past yr: 0 0 1 - 1  Injury with Fall? 0 0 0 - 0  Risk for fall due to : Impaired balance/gait Impaired balance/gait;Impaired mobility Impaired balance/gait;History of fall(s);Impaired mobility - History of fall(s)  Risk for fall due to: Comment - - - - obesity, knee and back pain  Follow up Falls evaluation completed Falls evaluation completed Education provided - -     Objective:  [name] seemed alert and oriented and she participated appropriately during our telephone visit.  Blood Pressure Weight BMI  BP Readings from Last 3 Encounters:  04/27/20 (!) 150/86  12/03/19 137/84  10/15/19 132/83   Wt Readings from Last 3 Encounters:  04/27/20 (!) 343 lb (155.6 kg)  12/03/19 (!) 300 lb 2 oz (136.1 kg)  10/15/19 (!) 300 lb 3.2 oz (136.2 kg)   BMI Readings from Last 1 Encounters:  04/27/20 60.76 kg/m    *Unable to obtain current vital signs, weight, and BMI due to telephone visit type  Hearing/Vision  . [name] did not seem to have difficulty with hearing/understanding during the telephone conversation . Reports that she has had a formal eye exam by an eye care professional within the past year . Reports that she has not had a formal hearing evaluation within the past year *Unable to fully assess hearing and vision during telephone visit type  Cognitive Function: 6CIT Screen  07/10/2020 07/09/2019  What Year? 0 points 0 points  What month? 0 points 0 points  What time? 0 points 0 points  Count back from 20 0 points 0 points  Months in reverse 0 points 2 points  Repeat phrase 0 points 2 points  Total Score 0 4   (Normal:0-7, Significant for Dysfunction: >8)  Normal Cognitive Function Screening: Yes   Immunization & Health Maintenance Record Immunization History  Administered Date(s) Administered  . DTaP 07/25/2013  . Influenza Split 05/08/2013  . Influenza, Seasonal, Injecte, Preservative Fre 07/07/2014  . Influenza,inj,Quad PF,6+ Mos 07/07/2014, 05/14/2018, 04/02/2019, 03/31/2020  . Influenza-Unspecified 02/23/2016  . Moderna Sars-Covid-2 Vaccination 11/14/2019, 12/12/2019, 06/13/2020  . Pneumococcal Conjugate-13 12/27/2010  . Pneumococcal Polysaccharide-23 05/04/2011  . Pneumococcal-Unspecified 05/04/2011  . Tdap 02/25/2013    Health Maintenance  Topic Date Due  . HIV Screening  Never done  . PAP SMEAR-Modifier  Never done  . MAMMOGRAM  06/19/2014  . COLONOSCOPY  09/25/2021  . TETANUS/TDAP  02/26/2023  . INFLUENZA VACCINE  Completed  . COVID-19 Vaccine  Completed  . Hepatitis C Screening  Completed       Assessment  This is a routine wellness examination for [name].  Health Maintenance: Due or Overdue Health Maintenance Due  Topic Date Due  . HIV Screening  Never done  . PAP SMEAR-Modifier  Never done  . MAMMOGRAM  06/19/2014    [name] does not need a referral for [facility]  Assistance: Care Management:   no Social Work:    no Prescription Assistance:  no Nutrition/Diabetes Education:  no   Plan:  Personalized Goals Goals Addressed            This Visit's Progress   . Patient Stated       07/10/2020 AWV Goal: Exercise for General Health   Patient will verbalize understanding of the benefits of increased physical activity:  Exercising regularly is important. It will improve your overall fitness,  flexibility, and endurance.  Regular exercise also will improve your overall health. It can help you control your weight, reduce stress, and improve your bone density.  Over the next year, patient will increase physical activity as tolerated with a goal of at least 150 minutes of moderate physical activity per week.   You can tell that you are exercising at a moderate intensity if your heart starts beating faster and you start breathing faster but can still hold a conversation.  Moderate-intensity exercise ideas include:  Walking 1 mile (1.6 km) in about 15 minutes  Biking  Hiking  Golfing  Dancing  Water aerobics  Patient will verbalize understanding of everyday activities that increase physical activity by providing examples like the following: ? Yard work, such as: ? Pushing a Surveyor, mining ? Raking and bagging leaves ? Washing your car ? Pushing a stroller ? Shoveling snow ? Gardening ? Washing windows or floors  Patient will be able to explain general safety guidelines for exercising:   Before you start a new exercise program, talk with your health care provider.  Do not exercise so much that you hurt yourself, feel dizzy, or get very short of breath.  Wear comfortable clothes and wear shoes with good support.  Drink plenty of water while you exercise to prevent dehydration or heat stroke.  Work out until your breathing and your heartbeat get faster.       Personalized Health Maintenance & Screening Recommendations  Screening mammography Screening Pap smear and pelvic exam  Bone densitometry screening  Lung Cancer Screening Recommended: no (Low Dose CT Chest recommended if Age 17-80 years, 30 pack-year currently smoking OR have quit w/in past 15 years) Hepatitis C Screening recommended: no HIV Screening recommended: yes  Advanced Directives: Written information was not prepared per patient's request.  Referrals & Orders No orders of the defined types were  placed in this encounter.   Follow-up Plan . Follow-up with [name], MD as planned . Schedule pap smear . Schedule mammogram . Schedule bone density screening   I have personally reviewed and noted the following in the patient's chart:   . Medical and social history . Use of alcohol, tobacco or illicit drugs  . Current medications and supplements . Functional ability and status . Nutritional status . Physical activity . Advanced directives . List of other physicians . Hospitalizations, surgeries, and ER visits in previous 12 months . Vitals . Screenings to include cognitive, depression, and falls . Referrals and appointments  In addition, I have reviewed and discussed with [name] certain preventive protocols, quality metrics, and best practice recommendations. A written personalized care plan for preventive services as well as general preventive health recommendations is available and can be mailed to the patient at her request.      Ulice Brilliant  07/10/2020   Patient declines after visit summary

## 2020-07-13 ENCOUNTER — Other Ambulatory Visit: Payer: Self-pay | Admitting: Family Medicine

## 2020-07-13 DIAGNOSIS — R110 Nausea: Secondary | ICD-10-CM

## 2020-07-16 ENCOUNTER — Other Ambulatory Visit: Payer: Self-pay | Admitting: Family Medicine

## 2020-07-16 DIAGNOSIS — F5101 Primary insomnia: Secondary | ICD-10-CM

## 2020-07-19 ENCOUNTER — Other Ambulatory Visit: Payer: Self-pay | Admitting: Family Medicine

## 2020-07-20 ENCOUNTER — Other Ambulatory Visit: Payer: Self-pay | Admitting: Family Medicine

## 2020-07-28 ENCOUNTER — Ambulatory Visit: Payer: BC Managed Care – PPO | Admitting: Family Medicine

## 2020-08-10 ENCOUNTER — Other Ambulatory Visit: Payer: Self-pay | Admitting: *Deleted

## 2020-08-10 MED ORDER — IBUPROFEN-FAMOTIDINE 800-26.6 MG PO TABS: 1.0000 | ORAL_TABLET | Freq: Three times a day (TID) | ORAL | 0 refills | Status: DC

## 2020-08-12 ENCOUNTER — Other Ambulatory Visit: Payer: Self-pay | Admitting: Family Medicine

## 2020-08-12 DIAGNOSIS — E039 Hypothyroidism, unspecified: Secondary | ICD-10-CM

## 2020-08-14 ENCOUNTER — Other Ambulatory Visit: Payer: Self-pay | Admitting: Family Medicine

## 2020-08-14 DIAGNOSIS — R110 Nausea: Secondary | ICD-10-CM

## 2020-08-18 ENCOUNTER — Encounter: Payer: Self-pay | Admitting: Family Medicine

## 2020-08-18 ENCOUNTER — Ambulatory Visit (INDEPENDENT_AMBULATORY_CARE_PROVIDER_SITE_OTHER): Payer: BC Managed Care – PPO | Admitting: Family Medicine

## 2020-08-18 ENCOUNTER — Other Ambulatory Visit: Payer: Self-pay

## 2020-08-18 ENCOUNTER — Other Ambulatory Visit: Payer: Self-pay | Admitting: Family Medicine

## 2020-08-18 VITALS — BP 111/72 | HR 81 | Temp 97.6°F | Ht 63.0 in | Wt 357.0 lb

## 2020-08-18 DIAGNOSIS — E039 Hypothyroidism, unspecified: Secondary | ICD-10-CM

## 2020-08-18 DIAGNOSIS — F331 Major depressive disorder, recurrent, moderate: Secondary | ICD-10-CM

## 2020-08-18 DIAGNOSIS — Z114 Encounter for screening for human immunodeficiency virus [HIV]: Secondary | ICD-10-CM

## 2020-08-18 DIAGNOSIS — I10 Essential (primary) hypertension: Secondary | ICD-10-CM

## 2020-08-18 DIAGNOSIS — J4541 Moderate persistent asthma with (acute) exacerbation: Secondary | ICD-10-CM

## 2020-08-18 MED ORDER — SERTRALINE HCL 50 MG PO TABS: 50.0000 mg | ORAL_TABLET | Freq: Every day | ORAL | 1 refills | Status: DC

## 2020-08-18 MED ORDER — LOSARTAN POTASSIUM 50 MG PO TABS: 50.0000 mg | ORAL_TABLET | Freq: Every day | ORAL | 3 refills | Status: DC

## 2020-08-18 MED ORDER — PRAVASTATIN SODIUM 40 MG PO TABS
ORAL_TABLET | ORAL | 3 refills | Status: DC
Start: 2020-08-18 — End: 2021-02-26

## 2020-08-18 MED ORDER — BREO ELLIPTA 200-25 MCG/INH IN AEPB: 1.0000 | INHALATION_SPRAY | RESPIRATORY_TRACT | 11 refills | Status: DC

## 2020-08-18 MED ORDER — ALBUTEROL SULFATE HFA 108 (90 BASE) MCG/ACT IN AERS
INHALATION_SPRAY | RESPIRATORY_TRACT | 0 refills | Status: DC
Start: 2020-08-18 — End: 2021-01-04

## 2020-08-18 MED ORDER — MONTELUKAST SODIUM 10 MG PO TABS
ORAL_TABLET | ORAL | 1 refills | Status: DC
Start: 2020-08-18 — End: 2021-02-26

## 2020-08-19 LAB — CBC WITH DIFFERENTIAL/PLATELET
Basophils Absolute: 0 10*3/uL (ref 0.0–0.2)
Basos: 0 %
EOS (ABSOLUTE): 0.2 10*3/uL (ref 0.0–0.4)
Eos: 2 %
Hematocrit: 38.5 % (ref 34.0–46.6)
Hemoglobin: 12.6 g/dL (ref 11.1–15.9)
Immature Grans (Abs): 0 10*3/uL (ref 0.0–0.1)
Immature Granulocytes: 0 %
Lymphocytes Absolute: 1.6 10*3/uL (ref 0.7–3.1)
Lymphs: 18 %
MCH: 27.6 pg (ref 26.6–33.0)
MCHC: 32.7 g/dL (ref 31.5–35.7)
MCV: 84 fL (ref 79–97)
Monocytes Absolute: 0.5 10*3/uL (ref 0.1–0.9)
Monocytes: 6 %
Neutrophils Absolute: 6.6 10*3/uL (ref 1.4–7.0)
Neutrophils: 74 %
Platelets: 282 10*3/uL (ref 150–450)
RBC: 4.57 x10E6/uL (ref 3.77–5.28)
RDW: 14.8 % (ref 11.7–15.4)
WBC: 9 10*3/uL (ref 3.4–10.8)

## 2020-08-19 LAB — CMP14+EGFR
ALT: 24 IU/L (ref 0–32)
AST: 23 IU/L (ref 0–40)
Albumin/Globulin Ratio: 1.5 (ref 1.2–2.2)
Albumin: 4.4 g/dL (ref 3.8–4.9)
Alkaline Phosphatase: 148 IU/L — ABNORMAL HIGH (ref 44–121)
BUN/Creatinine Ratio: 15 (ref 9–23)
BUN: 18 mg/dL (ref 6–24)
Bilirubin Total: 0.2 mg/dL (ref 0.0–1.2)
CO2: 24 mmol/L (ref 20–29)
Calcium: 9.3 mg/dL (ref 8.7–10.2)
Chloride: 96 mmol/L (ref 96–106)
Creatinine, Ser: 1.2 mg/dL — ABNORMAL HIGH (ref 0.57–1.00)
GFR calc Af Amer: 58 mL/min/{1.73_m2} — ABNORMAL LOW (ref >59)
GFR calc non Af Amer: 50 mL/min/{1.73_m2} — ABNORMAL LOW (ref >59)
Globulin, Total: 3 g/dL (ref 1.5–4.5)
Glucose: 184 mg/dL — ABNORMAL HIGH (ref 65–99)
Potassium: 4.3 mmol/L (ref 3.5–5.2)
Sodium: 136 mmol/L (ref 134–144)
Total Protein: 7.4 g/dL (ref 6.0–8.5)

## 2020-08-19 LAB — HIV ANTIBODY (ROUTINE TESTING W REFLEX): HIV Screen 4th Generation wRfx: NONREACTIVE

## 2020-08-19 LAB — TSH+FREE T4
Free T4: 1.35 ng/dL (ref 0.82–1.77)
TSH: 1.39 u[IU]/mL (ref 0.450–4.500)

## 2020-08-20 NOTE — Progress Notes (Signed)
Hello Amelianna,  Your lab result is normal and/or stable.Some minor variations that are not significant are commonly marked abnormal, but do not represent any medical problem for you.  Best regards, Anthonny Schiller, M.D.

## 2020-08-23 ENCOUNTER — Encounter: Payer: Self-pay | Admitting: Family Medicine

## 2020-08-23 NOTE — Progress Notes (Signed)
Subjective:  Patient ID: [name], female    DOB: 04-16-1961  Age: 60 y.o. MRN: [id number]  CC: Medical Management of Chronic Issues   HPI [name] presents for  follow-up of hypertension. Patient has no history of headache chest pain or shortness of breath or recent cough. Patient also denies symptoms of TIA such as focal numbness or weakness. Patient denies side effects from medication. States taking it regularly.  Patient presents for follow-up on  thyroid. The patient has a history of hypothyroidism for many years. It has been stable recently. Pt. denies any change in  voice, loss of hair, heat or cold intolerance. Energy level has been adequate to good. Patient denies constipation and diarrhea. No myxedema. Medication is as noted below. Verified that pt is taking it daily on an empty stomach. Well tolerated.  [name] is also followed for depression.  This seems to be stable at the current time.  See PHQ score below.  Depression screen [facility] 2/9 08/18/2020 07/10/2020 04/27/2020 12/03/2019 10/15/2019  Decreased Interest 0 0 1 0 0  Down, Depressed, Hopeless _0 0 0  PHQ - 2 Score _1 0 0  Altered sleeping - - 0 - -  Tired, decreased energy - - 3 - -  Change in appetite - - 2 - -  Feeling bad or failure about yourself  - - 3 - -  Trouble concentrating - - 0 - -  Moving slowly or fidgety/restless - - 0 - -  Suicidal thoughts - - 0 - -  PHQ-9 Score - - 12 - -  Difficult doing work/chores - - - - -  Some recent data might be hidden    History [name] has a past medical history of Anemia, Anxiety, Arthritis, Asthma, Colon polyps, Depression, GERD (gastroesophageal reflux disease), Headache, High cholesterol, Hypertension, Lithium toxicity (06/2019), PONV (postoperative nausea and vomiting), Shortness of breath dyspnea, Sleep apnea, and Syncope and collapse.   She has a past surgical history that includes Endometrial ablation w/ novasure; Carpal tunnel release (2007); trigger  finger release surgeyr ; Tubal ligation; Knee arthroscopy; Knee arthroscopy with medial menisectomy (Right, 10/22/2013); Back surgery (2012); Colonoscopy; Excision metacarpal mass (Right, 03/10/2014); Total knee arthroplasty (Left, 04/16/2014); Irrigation and debridement knee (Left, 05/23/2014); Total knee arthroplasty (Right, 12/23/2014); and Excisional total knee arthroplasty with antibiotic spacers (Left, 09/19/2019).   Her family history includes Alcohol abuse in her mother; Arthritis in her mother; Cancer in her brother, daughter, and maternal grandmother; Gout in her father; Heart disease in her mother; Heart disease (age of onset: 87) in her father; Hyperlipidemia in her father; Hypertension in her father and sister.She reports that she has never smoked. She has never used smokeless tobacco. She reports that she does not drink alcohol and does not use drugs.  Current Outpatient Medications on File Prior to Visit  Medication Sig Dispense Refill  . acetaminophen (TYLENOL) 500 MG tablet Take 2 tablets (1,000 mg total) by mouth every 8 (eight) hours. 30 tablet 0  . cyclobenzaprine (FLEXERIL) 5 MG tablet cyclobenzaprine 5 mg tablet  TAKE 1 TABLET BY MOUTH EVERY 8 HOURS AS NEEDED FOR MUSCLE SPASMS    . Ibuprofen-Famotidine 800-26.6 MG TABS Take 1 tablet by mouth 3 (three) times daily. 90 tablet 0  . levothyroxine (SYNTHROID) 100 MCG tablet TAKE 1 TABLET BY MOUTH EVERY DAY BEFORE BREAKFAST 90 tablet 2  . omeprazole (PRILOSEC) 40 MG capsule Take 1 capsule (40 mg total) by mouth daily.  90 capsule 3  . promethazine (PHENERGAN) 25 MG tablet TAKE 1 TABLET BY MOUTH EVERY 6 HOURS AS NEEDED FOR NAUSEA AND VOMITING 30 tablet 1  . trazodone (DESYREL) 300 MG tablet TAKE 1 TABLET BY MOUTH EVERYDAY AT BEDTIME 90 tablet 1   No current facility-administered medications on file prior to visit.    ROS Review of Systems  Constitutional: Negative.   HENT: Negative.   Eyes: Negative for visual disturbance.   Respiratory: Negative for shortness of breath.   Cardiovascular: Negative for chest pain.  Gastrointestinal: Negative for abdominal pain.  Musculoskeletal: Negative for arthralgias.    Objective:  BP 111/72   Pulse 81   Temp 97.6 F (36.4 C) (Temporal)   Ht 5' 3" (1.6 m)   Wt (!) 357 lb (161.9 kg)   BMI 63.24 kg/m   BP Readings from Last 3 Encounters:  08/18/20 111/72  04/27/20 (!) 150/86  12/03/19 137/84    Wt Readings from Last 3 Encounters:  08/18/20 (!) 357 lb (161.9 kg)  04/27/20 (!) 343 lb (155.6 kg)  12/03/19 (!) 300 lb 2 oz (136.1 kg)     Physical Exam Constitutional:      General: She is not in acute distress.    Appearance: She is well-developed and well-nourished.  Cardiovascular:     Rate and Rhythm: Normal rate and regular rhythm.  Pulmonary:     Breath sounds: Normal breath sounds.  Skin:    General: Skin is warm and dry.  Neurological:     Mental Status: She is alert and oriented to person, place, and time.  Psychiatric:        Mood and Affect: Mood and affect normal.       Assessment & Plan:   [name] was seen today for medical management of chronic issues.  Diagnoses and all orders for this visit:  Hypothyroidism, unspecified type -     CBC with Differential/Platelet -     CMP14+EGFR -     TSH + free T4  Moderate episode of recurrent major depressive disorder (HCC) -     sertraline (ZOLOFT) 50 MG tablet; Take 1 tablet (50 mg total) by mouth at bedtime. -     CBC with Differential/Platelet -     CMP14+EGFR -     TSH + free T4  Essential hypertension, benign -     losartan (COZAAR) 50 MG tablet; Take 1 tablet (50 mg total) by mouth daily. -     CBC with Differential/Platelet -     CMP14+EGFR -     TSH + free T4  Moderate persistent asthma with acute exacerbation -     fluticasone furoate-vilanterol (BREO ELLIPTA) 200-25 MCG/INH AEPB; Inhale 1 puff into the lungs every morning. -     CBC with Differential/Platelet -      CMP14+EGFR -     TSH + free T4  Encounter for screening for HIV -     HIV Antibody (routine testing w rflx)  Other orders -     pravastatin (PRAVACHOL) 40 MG tablet; TAKE 1 TABLET BY MOUTH EVERYDAY AT BEDTIME -     montelukast (SINGULAIR) 10 MG tablet; TAKE 1 TABLET BY MOUTH EVERYDAY AT BEDTIME -     albuterol (VENTOLIN HFA) 108 (90 Base) MCG/ACT inhaler; TAKE 2 PUFFS BY MOUTH EVERY 6 HOURS AS NEEDED FOR WHEEZE OR SHORTNESS OF BREATH   Allergies as of 08/18/2020      Reactions   Adhesive [tape] Other (See Comments)  Peeled off skin   Ceclor [cefaclor] Other (See Comments)   Blisters on hands   Chocolate Other (See Comments)   migraines   Venlafaxine Other (See Comments)   "refuses to take - makes me feel like I am dying"   Lisinopril Cough      Medication List       Accurate as of August 18, 2020 11:59 PM. If you have any questions, ask your nurse or doctor.        STOP taking these medications   cloNIDine 0.1 MG tablet Commonly known as: CATAPRES Stopped by: [name], MD   docusate sodium 100 MG capsule Commonly known as: Colace Stopped by: [name], MD   furosemide 20 MG tablet Commonly known as: LASIX Stopped by: [name], MD   methocarbamol 500 MG tablet Commonly known as: Robaxin Stopped by: [name], MD   rizatriptan 10 MG tablet Commonly known as: MAXALT Stopped by: [name], MD   solifenacin 5 MG tablet Commonly known as: VESICARE Stopped by: [name], MD     TAKE these medications   acetaminophen 500 MG tablet Commonly known as: TYLENOL Take 2 tablets (1,000 mg total) by mouth every 8 (eight) hours.   albuterol 108 (90 Base) MCG/ACT inhaler Commonly known as: VENTOLIN HFA TAKE 2 PUFFS BY MOUTH EVERY 6 HOURS AS NEEDED FOR WHEEZE OR SHORTNESS OF BREATH   Breo Ellipta 200-25 MCG/INH Aepb Generic drug: fluticasone furoate-vilanterol Inhale 1 puff into the lungs every morning.   cyclobenzaprine 5 MG  tablet Commonly known as: FLEXERIL cyclobenzaprine 5 mg tablet  TAKE 1 TABLET BY MOUTH EVERY 8 HOURS AS NEEDED FOR MUSCLE SPASMS   Ibuprofen-Famotidine 800-26.6 MG Tabs Take 1 tablet by mouth 3 (three) times daily.   levothyroxine 100 MCG tablet Commonly known as: SYNTHROID TAKE 1 TABLET BY MOUTH EVERY DAY BEFORE BREAKFAST   losartan 50 MG tablet Commonly known as: COZAAR Take 1 tablet (50 mg total) by mouth daily.   montelukast 10 MG tablet Commonly known as: SINGULAIR TAKE 1 TABLET BY MOUTH EVERYDAY AT BEDTIME   omeprazole 40 MG capsule Commonly known as: PRILOSEC Take 1 capsule (40 mg total) by mouth daily.   pravastatin 40 MG tablet Commonly known as: PRAVACHOL TAKE 1 TABLET BY MOUTH EVERYDAY AT BEDTIME   promethazine 25 MG tablet Commonly known as: PHENERGAN TAKE 1 TABLET BY MOUTH EVERY 6 HOURS AS NEEDED FOR NAUSEA AND VOMITING   sertraline 50 MG tablet Commonly known as: ZOLOFT TAKE 1 TABLET BY MOUTH EVERYDAY AT BEDTIME What changed: Another medication with the same name was added. Make sure you understand how and when to take each. Changed by: [name], MD   sertraline 50 MG tablet Commonly known as: ZOLOFT Take 1 tablet (50 mg total) by mouth at bedtime. What changed: You were already taking a medication with the same name, and this prescription was added. Make sure you understand how and when to take each. Changed by: [name], MD   trazodone 300 MG tablet Commonly known as: DESYREL TAKE 1 TABLET BY MOUTH EVERYDAY AT BEDTIME       Meds ordered this encounter  Medications  . sertraline (ZOLOFT) 50 MG tablet    Sig: Take 1 tablet (50 mg total) by mouth at bedtime.    Dispense:  90 tablet    Refill:  1  . pravastatin (PRAVACHOL) 40 MG tablet    Sig: TAKE 1 TABLET BY MOUTH EVERYDAY AT BEDTIME    Dispense:  90 tablet    Refill:  3  . montelukast (SINGULAIR) 10 MG tablet    Sig: TAKE 1 TABLET BY MOUTH EVERYDAY AT BEDTIME    Dispense:  90  tablet    Refill:  1  . losartan (COZAAR) 50 MG tablet    Sig: Take 1 tablet (50 mg total) by mouth daily.    Dispense:  90 tablet    Refill:  3  . fluticasone furoate-vilanterol (BREO ELLIPTA) 200-25 MCG/INH AEPB    Sig: Inhale 1 puff into the lungs every morning.    Dispense:  1 each    Refill:  11  . albuterol (VENTOLIN HFA) 108 (90 Base) MCG/ACT inhaler    Sig: TAKE 2 PUFFS BY MOUTH EVERY 6 HOURS AS NEEDED FOR WHEEZE OR SHORTNESS OF BREATH    Dispense:  18 each    Refill:  0      Follow-up: Return in about 6 months (around 02/15/2021).  [name], M.D.

## 2020-08-31 ENCOUNTER — Other Ambulatory Visit: Payer: Self-pay | Admitting: Family Medicine

## 2020-09-01 ENCOUNTER — Other Ambulatory Visit: Payer: Self-pay | Admitting: Family Medicine

## 2020-09-04 ENCOUNTER — Other Ambulatory Visit: Payer: Self-pay | Admitting: Family Medicine

## 2020-09-27 ENCOUNTER — Other Ambulatory Visit: Payer: Self-pay | Admitting: Family Medicine

## 2020-09-27 DIAGNOSIS — R110 Nausea: Secondary | ICD-10-CM

## 2020-11-10 ENCOUNTER — Other Ambulatory Visit: Payer: Self-pay | Admitting: Family Medicine

## 2020-11-10 DIAGNOSIS — R110 Nausea: Secondary | ICD-10-CM

## 2020-11-10 NOTE — Telephone Encounter (Signed)
Last office visit 08/18/20 Last refill 09/28/20, #30, 1 refill

## 2020-11-18 ENCOUNTER — Other Ambulatory Visit: Payer: Self-pay | Admitting: Family Medicine

## 2020-11-30 ENCOUNTER — Encounter: Payer: Self-pay | Admitting: Nurse Practitioner

## 2020-11-30 ENCOUNTER — Ambulatory Visit (INDEPENDENT_AMBULATORY_CARE_PROVIDER_SITE_OTHER): Payer: BC Managed Care – PPO | Admitting: Nurse Practitioner

## 2020-11-30 DIAGNOSIS — J0110 Acute frontal sinusitis, unspecified: Secondary | ICD-10-CM

## 2020-11-30 DIAGNOSIS — R110 Nausea: Secondary | ICD-10-CM | POA: Diagnosis not present

## 2020-11-30 MED ORDER — SALINE SPRAY 0.65 % NA SOLN: 1.0000 | NASAL | 0 refills | Status: DC | PRN

## 2020-11-30 MED ORDER — ONDANSETRON HCL 4 MG PO TABS: 4.0000 mg | ORAL_TABLET | Freq: Three times a day (TID) | ORAL | 0 refills | Status: DC | PRN

## 2020-11-30 MED ORDER — AZITHROMYCIN 250 MG PO TABS: ORAL_TABLET | ORAL | 0 refills | Status: AC

## 2020-11-30 MED ORDER — PREDNISONE 10 MG (21) PO TBPK: ORAL_TABLET | ORAL | 0 refills | Status: DC

## 2020-11-30 NOTE — Assessment & Plan Note (Signed)
Worsening sinus pressure with headache, nausea.  Patient has a history of asthma and seasonal allergies.  Advised patient to Take medication as prescribed Continue inhalers Tylenol/ibuprofen for headache/fever Prednisone taper Z-Pak Increase hydration  Coolmist humidifier

## 2020-11-30 NOTE — Progress Notes (Signed)
   Virtual Visit  Note Due to COVID-19 pandemic this visit was conducted virtually. This visit type was conducted due to national recommendations for restrictions regarding the COVID-19 Pandemic (e.g. social distancing, sheltering in place) in an effort to limit this patient's exposure and mitigate transmission in our community. All issues noted in this document were discussed and addressed.  A physical exam was not performed with this format.  I connected with [name] on 11/30/20 at  8:40 AM by telephone and verified that I am speaking with the correct person using two identifiers. [name] is currently located at home during visit. The provider, Daryll Drown, NP is located in their office at time of visit.  I discussed the limitations, risks, security and privacy concerns of performing an evaluation and management service by telephone and the availability of in person appointments. I also discussed with the patient that there may be a patient responsible charge related to this service. The patient expressed understanding and agreed to proceed.   History and Present Illness:  Sinusitis This is a new problem. Episode onset: In the past 3 days. The problem has been gradually worsening since onset. There has been no fever. The pain is moderate. Associated symptoms include congestion, coughing, headaches and a hoarse voice. Pertinent negatives include no chills, ear pain or shortness of breath. (Nausea) Past treatments include acetaminophen. The treatment provided no relief.      Review of Systems  Constitutional: Negative for chills.  HENT: Positive for congestion and hoarse voice. Negative for ear pain.   Respiratory: Positive for cough. Negative for shortness of breath.   Neurological: Positive for headaches.  All other systems reviewed and are negative.    Observations/Objective: Televisit patient did not sound to be in distress.  Assessment and Plan:  Subacute frontal  sinusitis Worsening sinus pressure with headache, nausea.  Patient has a history of asthma and seasonal allergies.  Advised patient to Take medication as prescribed Continue inhalers Tylenol/ibuprofen for headache/fever Prednisone taper Z-Pak Increase hydration  Coolmist humidifier   Nausea Sinusitis associated with nausea.  Zofran 4 mg tablet every 8 hours as needed.  Rx sent to pharmacy.  Patient declined COVID-19 test today.   Follow Up Instructions: Follow-up with worsening unresolved symptoms.   I discussed the assessment and treatment plan with the patient. The patient was provided an opportunity to ask questions and all were answered. The patient agreed with the plan and demonstrated an understanding of the instructions.   The patient was advised to call back or seek an in-person evaluation if the symptoms worsen or if the condition fails to improve as anticipated.  The above assessment and management plan was discussed with the patient. The patient verbalized understanding of and has agreed to the management plan. Patient is aware to call the clinic if symptoms persist or worsen. Patient is aware when to return to the clinic for a follow-up visit. Patient educated on when it is appropriate to go to the emergency department.   Time call ended: 8:49 AM  I provided 9 minutes of  non face-to-face time during this encounter.    Daryll Drown, NP

## 2020-11-30 NOTE — Assessment & Plan Note (Signed)
Sinusitis associated with nausea.  Zofran 4 mg tablet every 8 hours as needed.  Rx sent to pharmacy.  Patient declined COVID-19 test today.

## 2020-12-09 ENCOUNTER — Ambulatory Visit (INDEPENDENT_AMBULATORY_CARE_PROVIDER_SITE_OTHER): Payer: BC Managed Care – PPO | Admitting: Family Medicine

## 2020-12-09 ENCOUNTER — Other Ambulatory Visit: Payer: Self-pay

## 2020-12-09 ENCOUNTER — Encounter: Payer: Self-pay | Admitting: Family Medicine

## 2020-12-09 VITALS — BP 138/73 | HR 80 | Temp 98.0°F | Ht 63.0 in | Wt 353.8 lb

## 2020-12-09 DIAGNOSIS — J4541 Moderate persistent asthma with (acute) exacerbation: Secondary | ICD-10-CM | POA: Diagnosis not present

## 2020-12-09 DIAGNOSIS — F5101 Primary insomnia: Secondary | ICD-10-CM

## 2020-12-09 DIAGNOSIS — G4733 Obstructive sleep apnea (adult) (pediatric): Secondary | ICD-10-CM | POA: Diagnosis not present

## 2020-12-09 MED ORDER — TRELEGY ELLIPTA 100-62.5-25 MCG/INH IN AEPB: 1.0000 | INHALATION_SPRAY | Freq: Every day | RESPIRATORY_TRACT | 11 refills | Status: DC

## 2020-12-09 MED ORDER — PREDNISONE 10 MG PO TABS: ORAL_TABLET | ORAL | 0 refills | Status: DC

## 2020-12-09 MED ORDER — MOXIFLOXACIN HCL 400 MG PO TABS: 400.0000 mg | ORAL_TABLET | Freq: Every day | ORAL | 0 refills | Status: DC

## 2020-12-09 NOTE — Progress Notes (Signed)
Subjective:  Patient ID: [name], female    DOB: Feb 12, 1961  Age: 60 y.o. MRN: [id number]  CC: Shortness of Breath   HPI [name] presents for Dyspnea walking about 50 feet. Cough with yellow/ green sputum. No fever. Breathing hard. Onset 2 weeks ago.  On the ninth, 9 days ago, she was given a Z-Pak and a steroid taper by one of our nurse practitioners through a telephone office visit.  That record was reviewed.  She said the prednisone helped a little bit the Z-Pak was finished and she seemed better for a while but now it is actually worse than when she began.  Having apnea at night. In the pa PACU also to get an A1c currently thank you st had CPAP. Needs new sleep study. Off CPAP for many years.   GAining weight admits not eating right. Started going to a gym.   Depression screen [facility] 2/9 12/09/2020 08/18/2020 07/10/2020  Decreased Interest 2 0 0  Down, Depressed, Hopeless 1 1 1   PHQ - 2 Score 3 1 1   Altered sleeping 3 - -  Tired, decreased energy 3 - -  Change in appetite 1 - -  Feeling bad or failure about yourself  2 - -  Trouble concentrating 1 - -  Moving slowly or fidgety/restless 0 - -  Suicidal thoughts 0 - -  PHQ-9 Score 13 - -  Difficult doing work/chores Somewhat difficult - -  Some recent data might be hidden    History [name] has a past medical history of Anemia, Anxiety, Arthritis, Asthma, Colon polyps, Depression, GERD (gastroesophageal reflux disease), Headache, High cholesterol, Hypertension, Lithium toxicity (06/2019), PONV (postoperative nausea and vomiting), Shortness of breath dyspnea, Sleep apnea, and Syncope and collapse.   She has a past surgical history that includes Endometrial ablation w/ novasure; Carpal tunnel release (2007); trigger finger release surgeyr ; Tubal ligation; Knee arthroscopy; Knee arthroscopy with medial menisectomy (Right, 10/22/2013); Back surgery (2012); Colonoscopy; Excision metacarpal mass (Right, 03/10/2014); Total knee  arthroplasty (Left, 04/16/2014); Irrigation and debridement knee (Left, 05/23/2014); Total knee arthroplasty (Right, 12/23/2014); and Excisional total knee arthroplasty with antibiotic spacers (Left, 09/19/2019).   Her family history includes Alcohol abuse in her mother; Arthritis in her mother; Cancer in her brother, daughter, and maternal grandmother; Gout in her father; Heart disease in her mother; Heart disease (age of onset: 61) in her father; Hyperlipidemia in her father; Hypertension in her father and sister.She reports that she has never smoked. She has never used smokeless tobacco. She reports that she does not drink alcohol and does not use drugs.    ROS Review of Systems  Objective:  BP 138/73   Pulse 80   Temp 98 F (36.7 C)   Ht 5\' 3"  (1.6 m)   Wt (!) 353 lb 12.8 oz (160.5 kg)   SpO2 92%   BMI 62.67 kg/m   BP Readings from Last 3 Encounters:  12/09/20 138/73  08/18/20 111/72  04/27/20 (!) 150/86    Wt Readings from Last 3 Encounters:  12/09/20 (!) 353 lb 12.8 oz (160.5 kg)  08/18/20 (!) 357 lb (161.9 kg)  04/27/20 (!) 343 lb (155.6 kg)     Physical Exam    Assessment & Plan:   [name] was seen today for shortness of breath.  Diagnoses and all orders for this visit:  OSA (obstructive sleep apnea) -     Ambulatory referral to Sleep Studies  Moderate persistent asthma with acute exacerbation  Primary  insomnia  Other orders -     Fluticasone-Umeclidin-Vilant (TRELEGY ELLIPTA) 100-62.5-25 MCG/INH AEPB; Inhale 1 puff into the lungs daily. -     predniSONE (DELTASONE) 10 MG tablet; Take 5 daily for 3 days followed by 4,3,2 and 1 for 3 days each. -     moxifloxacin (AVELOX) 400 MG tablet; Take 1 tablet (400 mg total) by mouth daily. Take all of these, for infection       I have discontinued [name]. [name]'s predniSONE. I am also having her start on Trelegy Ellipta, predniSONE, and moxifloxacin. Additionally, I am having her maintain her acetaminophen,  omeprazole, trazodone, levothyroxine, sertraline, sertraline, pravastatin, montelukast, losartan, Breo Ellipta, albuterol, cyclobenzaprine, promethazine, Ibuprofen-Famotidine, sodium chloride, and ondansetron.  Allergies as of 12/09/2020      Reactions   Adhesive [tape] Other (See Comments)   Peeled off skin   Ceclor [cefaclor] Other (See Comments)   Blisters on hands   Chocolate Other (See Comments)   migraines   Venlafaxine Other (See Comments)   "refuses to take - makes me feel like I am dying"   Lisinopril Cough      Medication List       Accurate as of Dec 09, 2020  2:00 PM. If you have any questions, ask your nurse or doctor.        STOP taking these medications   predniSONE 10 MG (21) Tbpk tablet Commonly known as: STERAPRED UNI-PAK 21 TAB Replaced by: predniSONE 10 MG tablet Stopped by: [name], MD     TAKE these medications   acetaminophen 500 MG tablet Commonly known as: TYLENOL Take 2 tablets (1,000 mg total) by mouth every 8 (eight) hours.   albuterol 108 (90 Base) MCG/ACT inhaler Commonly known as: VENTOLIN HFA TAKE 2 PUFFS BY MOUTH EVERY 6 HOURS AS NEEDED FOR WHEEZE OR SHORTNESS OF BREATH   Breo Ellipta 200-25 MCG/INH Aepb Generic drug: fluticasone furoate-vilanterol Inhale 1 puff into the lungs every morning.   cyclobenzaprine 5 MG tablet Commonly known as: FLEXERIL cyclobenzaprine 5 mg tablet  TAKE 1 TABLET BY MOUTH EVERY 8 HOURS AS NEEDED FOR MUSCLE SPASMS   Ibuprofen-Famotidine 800-26.6 MG Tabs TAKE 1 TABLET BY MOUTH 3 TIMES A DAY   levothyroxine 100 MCG tablet Commonly known as: SYNTHROID TAKE 1 TABLET BY MOUTH EVERY DAY BEFORE BREAKFAST   losartan 50 MG tablet Commonly known as: COZAAR Take 1 tablet (50 mg total) by mouth daily.   montelukast 10 MG tablet Commonly known as: SINGULAIR TAKE 1 TABLET BY MOUTH EVERYDAY AT BEDTIME   moxifloxacin 400 MG tablet Commonly known as: Avelox Take 1 tablet (400 mg total) by mouth daily. Take  all of these, for infection Started by: [name], MD   omeprazole 40 MG capsule Commonly known as: PRILOSEC Take 1 capsule (40 mg total) by mouth daily.   ondansetron 4 MG tablet Commonly known as: Zofran Take 1 tablet (4 mg total) by mouth every 8 (eight) hours as needed for nausea or vomiting.   pravastatin 40 MG tablet Commonly known as: PRAVACHOL TAKE 1 TABLET BY MOUTH EVERYDAY AT BEDTIME   predniSONE 10 MG tablet Commonly known as: DELTASONE Take 5 daily for 3 days followed by 4,3,2 and 1 for 3 days each. Replaces: predniSONE 10 MG (21) Tbpk tablet Started by: [name], MD   promethazine 25 MG tablet Commonly known as: PHENERGAN TAKE 1 TABLET BY MOUTH EVERY 6 HOURS AS NEEDED FOR NAUSEA AND VOMITING   sertraline 50 MG tablet Commonly  known as: ZOLOFT TAKE 1 TABLET BY MOUTH EVERYDAY AT BEDTIME   sertraline 50 MG tablet Commonly known as: ZOLOFT Take 1 tablet (50 mg total) by mouth at bedtime.   sodium chloride 0.65 % Soln nasal spray Commonly known as: OCEAN Place 1 spray into both nostrils as needed for congestion.   trazodone 300 MG tablet Commonly known as: DESYREL TAKE 1 TABLET BY MOUTH EVERYDAY AT BEDTIME   Trelegy Ellipta 100-62.5-25 MCG/INH Aepb Generic drug: Fluticasone-Umeclidin-Vilant Inhale 1 puff into the lungs daily. Started by: [name], MD        Follow-up: No follow-ups on file.  [name], M.D.

## 2020-12-10 ENCOUNTER — Other Ambulatory Visit: Payer: Self-pay | Admitting: *Deleted

## 2020-12-10 ENCOUNTER — Other Ambulatory Visit: Payer: Self-pay | Admitting: Family

## 2020-12-10 ENCOUNTER — Telehealth: Payer: Self-pay | Admitting: Family Medicine

## 2020-12-10 DIAGNOSIS — K219 Gastro-esophageal reflux disease without esophagitis: Secondary | ICD-10-CM

## 2020-12-10 MED ORDER — TRELEGY ELLIPTA 100-62.5-25 MCG/INH IN AEPB
1.0000 | INHALATION_SPRAY | Freq: Every day | RESPIRATORY_TRACT | 11 refills | Status: DC
Start: 2020-12-10 — End: 2021-08-18

## 2020-12-10 NOTE — Telephone Encounter (Signed)
Medication sent to pharmacy  

## 2020-12-10 NOTE — Telephone Encounter (Signed)
Patient calling back checking her RX for Trelegy. Called this morning and left a message.

## 2020-12-16 ENCOUNTER — Telehealth: Payer: Self-pay | Admitting: Family Medicine

## 2020-12-16 DIAGNOSIS — M1711 Unilateral primary osteoarthritis, right knee: Secondary | ICD-10-CM

## 2020-12-18 NOTE — Telephone Encounter (Signed)
Pt wants update on this PA

## 2020-12-20 ENCOUNTER — Other Ambulatory Visit: Payer: Self-pay | Admitting: Family Medicine

## 2020-12-20 DIAGNOSIS — R110 Nausea: Secondary | ICD-10-CM

## 2020-12-20 DIAGNOSIS — F5101 Primary insomnia: Secondary | ICD-10-CM

## 2020-12-22 NOTE — Telephone Encounter (Signed)
Last office visit 12/09/20  Last refill Trazodone 07/20/20, #90, 1 refill Promethazine 11/10/20, #30, 1 refill

## 2020-12-22 NOTE — Telephone Encounter (Signed)
Approved today Your PA request has been approved. Additional information will be provided in the approval communication  Pt aware

## 2020-12-22 NOTE — Telephone Encounter (Addendum)
Sent to [facility] today Key: [id number] Ibuprofen-Famotidine 800-26.6MG  tablets Dx; M17.11  NOTE _ PA specifically asked " is the pts creatinine clearance less than 34ml/min" - answer is NO according to calculator she is >100 (130.75)

## 2020-12-23 ENCOUNTER — Telehealth: Payer: Self-pay | Admitting: Family Medicine

## 2020-12-23 DIAGNOSIS — F5101 Primary insomnia: Secondary | ICD-10-CM

## 2020-12-23 DIAGNOSIS — R110 Nausea: Secondary | ICD-10-CM

## 2020-12-23 NOTE — Telephone Encounter (Signed)
  Prescription Request  12/23/2020  What is the name of the medication or equipment? Promethazine 25 mg and Trazodone 300 mg. Stacks told her when she needed these to call in and asked for them  Have you contacted your pharmacy to request a refill? (if applicable) YES  Which pharmacy would you like this sent to? [facility] in [place]   Patient notified that their request is being sent to the clinical staff for review and that they should receive a response within 2 business days.

## 2020-12-23 NOTE — Telephone Encounter (Signed)
Pt aware refills sent to pharmacy, via electronic request

## 2021-01-02 ENCOUNTER — Other Ambulatory Visit: Payer: Self-pay | Admitting: Family Medicine

## 2021-01-11 ENCOUNTER — Other Ambulatory Visit: Payer: Self-pay | Admitting: Family Medicine

## 2021-01-18 ENCOUNTER — Other Ambulatory Visit: Payer: Self-pay | Admitting: Family Medicine

## 2021-01-18 DIAGNOSIS — R110 Nausea: Secondary | ICD-10-CM

## 2021-01-29 ENCOUNTER — Other Ambulatory Visit: Payer: Self-pay | Admitting: Family Medicine

## 2021-02-11 ENCOUNTER — Institutional Professional Consult (permissible substitution): Payer: BC Managed Care – PPO | Admitting: Neurology

## 2021-02-15 ENCOUNTER — Ambulatory Visit: Payer: Medicare Other | Admitting: Family Medicine

## 2021-02-22 ENCOUNTER — Ambulatory Visit (INDEPENDENT_AMBULATORY_CARE_PROVIDER_SITE_OTHER): Payer: BC Managed Care – PPO | Admitting: Family

## 2021-02-22 ENCOUNTER — Other Ambulatory Visit: Payer: Self-pay | Admitting: Family Medicine

## 2021-02-22 ENCOUNTER — Encounter: Payer: Self-pay | Admitting: Family

## 2021-02-22 DIAGNOSIS — J4541 Moderate persistent asthma with (acute) exacerbation: Secondary | ICD-10-CM

## 2021-02-22 DIAGNOSIS — R110 Nausea: Secondary | ICD-10-CM

## 2021-02-22 DIAGNOSIS — R059 Cough, unspecified: Secondary | ICD-10-CM

## 2021-02-22 MED ORDER — BENZONATATE 200 MG PO CAPS: 200.0000 mg | ORAL_CAPSULE | Freq: Three times a day (TID) | ORAL | 1 refills | Status: DC | PRN

## 2021-02-22 MED ORDER — PREDNISONE 10 MG (21) PO TBPK: ORAL_TABLET | ORAL | 0 refills | Status: DC

## 2021-02-22 NOTE — Progress Notes (Signed)
Virtual Visit  Note Due to COVID-19 pandemic this visit was conducted virtually. This visit type was conducted due to national recommendations for restrictions regarding the COVID-19 Pandemic (e.g. social distancing, sheltering in place) in an effort to limit this patient's exposure and mitigate transmission in our community. All issues noted in this document were discussed and addressed.  A physical exam was not performed with this format.  I connected with [name] on 02/22/21 at 1:49 pm  by telephone and verified that I am speaking with the correct person using two identifiers. [name] is currently located at home and no one is currently with her during visit. The provider, Jannifer Rodney, FNP is located in their office at time of visit.  I discussed the limitations, risks, security and privacy concerns of performing an evaluation and management service by telephone and the availability of in person appointments. I also discussed with the patient that there may be a patient responsible charge related to this service. The patient expressed understanding and agreed to proceed.  [name], [name] are scheduled for a virtual visit with your provider today.    Just as we do with appointments in the office, we must obtain your consent to participate.  Your consent will be active for this visit and any virtual visit you may have with one of our providers in the next 365 days.    If you have a MyChart account, I can also send a copy of this consent to you electronically.  All virtual visits are billed to your insurance company just like a traditional visit in the office.  As this is a virtual visit, video technology does not allow for your provider to perform a traditional examination.  This may limit your provider's ability to fully assess your condition.  If your provider identifies any concerns that need to be evaluated in person or the need to arrange testing such as labs, EKG, etc, we will  make arrangements to do so.    Although advances in technology are sophisticated, we cannot ensure that it will always work on either your end or our end.  If the connection with a video visit is poor, we may have to switch to a telephone visit.  With either a video or telephone visit, we are not always able to ensure that we have a secure connection.   I need to obtain your verbal consent now.   Are you willing to proceed with your visit today?   [name] has provided verbal consent on 02/22/2021 for a virtual visit (video or telephone).   Jannifer Rodney, [place] 02/22/2021  1:54 PM     History and Present Illness:  Cough This is a new problem. The current episode started 1 to 4 weeks ago. The problem has been waxing and waning. The problem occurs every few minutes. The cough is Non-productive. Associated symptoms include nasal congestion, postnasal drip, shortness of breath and wheezing. Pertinent negatives include no chills, ear congestion, ear pain, fever, headaches or sore throat. She has tried rest and ipratropium inhaler for the symptoms. The treatment provided mild relief. Her past medical history is significant for asthma.     Review of Systems  Constitutional:  Negative for chills and fever.  HENT:  Positive for postnasal drip. Negative for ear pain and sore throat.   Respiratory:  Positive for cough, shortness of breath and wheezing.   Neurological:  Negative for headaches.    Observations/Objective: No SOB or distress  noted, hoarse voice  Assessment and Plan: 1. Moderate persistent asthma with acute exacerbation - Take meds as prescribed - Use a cool mist humidifier  -Use saline nose sprays frequently -Force fluids -For any cough or congestion  Use plain Mucinex- regular strength or max strength is fine -For fever or aces or pains- take tylenol or ibuprofen. -Throat lozenges if help -COVID test pending  RTO if symptoms worsen or do not improve  - predniSONE  (STERAPRED UNI-PAK 21 TAB) 10 MG (21) TBPK tablet; Use as directed  Dispense: 21 tablet; Refill: 0 - benzonatate (TESSALON) 200 MG capsule; Take 1 capsule (200 mg total) by mouth 3 (three) times daily as needed.  Dispense: 30 capsule; Refill: 1  2. Cough - Novel Coronavirus, NAA ([facility])     I discussed the assessment and treatment plan with the patient. The patient was provided an opportunity to ask questions and all were answered. The patient agreed with the plan and demonstrated an understanding of the instructions.   The patient was advised to call back or seek an in-person evaluation if the symptoms worsen or if the condition fails to improve as anticipated.  The above assessment and management plan was discussed with the patient. The patient verbalized understanding of and has agreed to the management plan. Patient is aware to call the clinic if symptoms persist or worsen. Patient is aware when to return to the clinic for a follow-up visit. Patient educated on when it is appropriate to go to the emergency department.   Time call ended: 2:01 pm   I provided 12 minutes of  non face-to-face time during this encounter.    Jannifer Rodney, FNP

## 2021-02-23 ENCOUNTER — Other Ambulatory Visit: Payer: Self-pay | Admitting: Family Medicine

## 2021-02-23 LAB — NOVEL CORONAVIRUS, NAA: SARS-CoV-2, NAA: NOT DETECTED

## 2021-02-23 LAB — SARS-COV-2, NAA 2 DAY TAT

## 2021-02-26 ENCOUNTER — Ambulatory Visit (INDEPENDENT_AMBULATORY_CARE_PROVIDER_SITE_OTHER): Payer: BC Managed Care – PPO | Admitting: Family Medicine

## 2021-02-26 ENCOUNTER — Other Ambulatory Visit: Payer: Self-pay

## 2021-02-26 ENCOUNTER — Encounter: Payer: Self-pay | Admitting: Family Medicine

## 2021-02-26 VITALS — BP 133/74 | HR 86 | Temp 97.4°F | Ht 63.0 in | Wt 344.0 lb

## 2021-02-26 DIAGNOSIS — F331 Major depressive disorder, recurrent, moderate: Secondary | ICD-10-CM

## 2021-02-26 DIAGNOSIS — E7800 Pure hypercholesterolemia, unspecified: Secondary | ICD-10-CM | POA: Diagnosis not present

## 2021-02-26 DIAGNOSIS — F5101 Primary insomnia: Secondary | ICD-10-CM

## 2021-02-26 DIAGNOSIS — E039 Hypothyroidism, unspecified: Secondary | ICD-10-CM

## 2021-02-26 DIAGNOSIS — I10 Essential (primary) hypertension: Secondary | ICD-10-CM | POA: Diagnosis not present

## 2021-02-26 MED ORDER — MONTELUKAST SODIUM 10 MG PO TABS: ORAL_TABLET | ORAL | 3 refills | Status: DC

## 2021-02-26 MED ORDER — PRAVASTATIN SODIUM 40 MG PO TABS: ORAL_TABLET | ORAL | 3 refills | Status: DC

## 2021-02-26 MED ORDER — LEVOTHYROXINE SODIUM 100 MCG PO TABS
ORAL_TABLET | ORAL | 3 refills | Status: DC
Start: 2021-02-26 — End: 2021-04-05

## 2021-02-26 MED ORDER — IBUPROFEN-FAMOTIDINE 800-26.6 MG PO TABS: 1.0000 | ORAL_TABLET | Freq: Three times a day (TID) | ORAL | 3 refills | Status: DC

## 2021-02-26 MED ORDER — LEVOFLOXACIN 500 MG PO TABS: 500.0000 mg | ORAL_TABLET | Freq: Every day | ORAL | 0 refills | Status: DC

## 2021-02-26 MED ORDER — LOSARTAN POTASSIUM 50 MG PO TABS: 50.0000 mg | ORAL_TABLET | Freq: Every day | ORAL | 3 refills | Status: DC

## 2021-02-26 MED ORDER — SERTRALINE HCL 50 MG PO TABS: 50.0000 mg | ORAL_TABLET | Freq: Every day | ORAL | 3 refills | Status: DC

## 2021-02-26 MED ORDER — PREDNISONE 10 MG PO TABS: ORAL_TABLET | ORAL | 0 refills | Status: DC

## 2021-02-26 MED ORDER — TRAZODONE HCL 300 MG PO TABS: ORAL_TABLET | ORAL | 3 refills | Status: DC

## 2021-02-26 NOTE — Progress Notes (Signed)
Subjective:  Patient ID: [name], female    DOB: May 17, 1961  Age: 60 y.o. MRN: [id number]  CC: Medical Management of Chronic Issues   HPI [name] presents for follow-up of elevated cholesterol. Doing well without complaints on current medication. Denies side effects of statin including myalgia and arthralgia and nausea. Also in today for liver function testing. Currently no chest pain, shortness of breath or other cardiovascular related symptoms noted.  Has a cough with dyspnea for about a week.  History [name] has a past medical history of Anemia, Anxiety, Arthritis, Asthma, Colon polyps, Depression, GERD (gastroesophageal reflux disease), Headache, High cholesterol, Hypertension, Lithium toxicity (06/2019), PONV (postoperative nausea and vomiting), Shortness of breath dyspnea, Sleep apnea, and Syncope and collapse.   [name] has a past surgical history that includes Endometrial ablation w/ novasure; Carpal tunnel release (2007); trigger finger release surgeyr ; Tubal ligation; Knee arthroscopy; Knee arthroscopy with medial menisectomy (Right, 10/22/2013); Back surgery (2012); Colonoscopy; Excision metacarpal mass (Right, 03/10/2014); Total knee arthroplasty (Left, 04/16/2014); Irrigation and debridement knee (Left, 05/23/2014); Total knee arthroplasty (Right, 12/23/2014); and Excisional total knee arthroplasty with antibiotic spacers (Left, 09/19/2019).   Her family history includes Alcohol abuse in her mother; Arthritis in her mother; Cancer in her brother, daughter, and maternal grandmother; Gout in her father; Heart disease in her mother; Heart disease (age of onset: 64) in her father; Hyperlipidemia in her father; Hypertension in her father and sister.[name] reports that [name] has never smoked. [name] has never used smokeless tobacco. [name] reports that [name] does not drink alcohol and does not use drugs.  Current Outpatient Medications on File Prior to Visit  Medication Sig Dispense Refill  .  acetaminophen (TYLENOL) 500 MG tablet Take 2 tablets (1,000 mg total) by mouth every 8 (eight) hours. 30 tablet 0  . albuterol (VENTOLIN HFA) 108 (90 Base) MCG/ACT inhaler TAKE 2 PUFFS BY MOUTH EVERY 6 HOURS AS NEEDED FOR WHEEZE OR SHORTNESS OF BREATH 18 each 0  . benzonatate (TESSALON) 200 MG capsule Take 1 capsule (200 mg total) by mouth 3 (three) times daily as needed. 30 capsule 1  . cyclobenzaprine (FLEXERIL) 5 MG tablet cyclobenzaprine 5 mg tablet  TAKE 1 TABLET BY MOUTH EVERY 8 HOURS AS NEEDED FOR MUSCLE SPASMS    . fluticasone furoate-vilanterol (BREO ELLIPTA) 200-25 MCG/INH AEPB Inhale 1 puff into the lungs every morning. 1 each 11  . Fluticasone-Umeclidin-Vilant (TRELEGY ELLIPTA) 100-62.5-25 MCG/INH AEPB Inhale 1 puff into the lungs daily. 28 each 11  . omeprazole (PRILOSEC) 40 MG capsule TAKE 1 CAPSULE BY MOUTH EVERY DAY 90 capsule 3  . ondansetron (ZOFRAN) 4 MG tablet Take 1 tablet (4 mg total) by mouth every 8 (eight) hours as needed for nausea or vomiting. 20 tablet 0  . promethazine (PHENERGAN) 25 MG tablet TAKE 1 TABLET BY MOUTH EVERY 6 HOURS AS NEEDED FOR NAUSEA AND VOMITING 30 tablet 2  . sodium chloride (OCEAN) 0.65 % SOLN nasal spray Place 1 spray into both nostrils as needed for congestion. 30 mL 0   No current facility-administered medications on file prior to visit.    ROS Review of Systems  Constitutional:  Positive for fatigue. Negative for fever.  HENT: Negative.    Eyes:  Negative for visual disturbance.  Respiratory:  Positive for cough and shortness of breath.   Cardiovascular:  Negative for chest pain.  Gastrointestinal:  Negative for abdominal pain.  Musculoskeletal:  Negative for arthralgias.  Psychiatric/Behavioral:  Positive for sleep disturbance (prednisone is  being taaken through the day, including dinner and bedtime).    Objective:  BP 133/74   Pulse 86   Temp (!) 97.4 F (36.3 C)   Ht $R'5\' 3"'gN$  (1.6 m)   Wt (!) 344 lb (156 kg)   SpO2 94%   BMI 60.94  kg/m   BP Readings from Last 3 Encounters:  02/26/21 133/74  12/09/20 138/73  08/18/20 111/72    Wt Readings from Last 3 Encounters:  02/26/21 (!) 344 lb (156 kg)  12/09/20 (!) 353 lb 12.8 oz (160.5 kg)  08/18/20 (!) 357 lb (161.9 kg)     Physical Exam Constitutional:      General: [name] is not in acute distress.    Appearance: [name] is well-developed. [name] is obese. [name] is not ill-appearing or diaphoretic.  HENT:     Head: Normocephalic.     Right Ear: Tympanic membrane, ear canal and external ear normal.     Left Ear: Ear canal and external ear normal.     Ears:     Comments: Left TM has fluid, "glue ear"    Nose: Congestion present.     Mouth/Throat:     Mouth: Mucous membranes are moist.  Eyes:     Extraocular Movements: Extraocular movements intact.     Pupils: Pupils are equal, round, and reactive to light.  Cardiovascular:     Rate and Rhythm: Normal rate and regular rhythm.  Pulmonary:     Breath sounds: Normal breath sounds.  Musculoskeletal:        General: Normal range of motion.  Skin:    General: Skin is warm and dry.  Neurological:     Mental Status: [name] is alert and oriented to person, place, and time.    Lab Results  Component Value Date   HGBA1C 5.5 08/20/2019   HGBA1C 6.0 04/02/2019   HGBA1C 6.0 06/24/2015    Lab Results  Component Value Date   WBC 9.0 08/18/2020   HGB 12.6 08/18/2020   HCT 38.5 08/18/2020   PLT 282 08/18/2020   GLUCOSE 184 (H) 08/18/2020   CHOL 196 04/09/2020   TRIG 212 (H) 04/09/2020   HDL 38 (L) 04/09/2020   LDLCALC 121 (H) 04/09/2020   ALT 24 08/18/2020   AST 23 08/18/2020   NA 136 08/18/2020   K 4.3 08/18/2020   CL 96 08/18/2020   CREATININE 1.20 (H) 08/18/2020   BUN 18 08/18/2020   CO2 24 08/18/2020   TSH 1.390 08/18/2020   INR 1.1 09/11/2019   HGBA1C 5.5 08/20/2019    [place] LIMITED JOINT SPACE STRUCTURES UP RIGHT  Result Date: 04/01/2020 CLINICAL DATA:  Dorsal right hand soft tissue swelling which weaning  the second and third metacarpal heads for the past 2 months. EXAM: ULTRASOUND RIGHT UPPER EXTREMITY LIMITED TECHNIQUE: Ultrasound examination of the upper extremity soft tissues was performed in the area of clinical concern. COMPARISON:  None. FINDINGS: Focused ultrasound of the dorsal right hand between the second and third metacarpal heads demonstrates mild soft tissue fullness but no discrete soft tissue mass or fluid collection. IMPRESSION: 1. Mild soft tissue fullness in the area of concern. No discrete soft tissue mass or fluid collection. Electronically Signed   By: Titus Dubin M.D.   On: 04/01/2020 17:01    Assessment & Plan:   Sade was seen today for medical management of chronic issues.  Diagnoses and all orders for this visit:  High cholesterol -     Lipid panel  Hypothyroidism,  unspecified type -     levothyroxine (SYNTHROID) 100 MCG tablet; TAKE 1 TABLET BY MOUTH EVERY DAY BEFORE BREAKFAST -     CBC with Differential/Platelet -     CMP14+EGFR -     TSH + free T4  Essential hypertension, benign -     losartan (COZAAR) 50 MG tablet; Take 1 tablet (50 mg total) by mouth daily. -     CBC with Differential/Platelet -     CMP14+EGFR  Primary insomnia -     trazodone (DESYREL) 300 MG tablet; TAKE 1 TABLET BY MOUTH EVERYDAY AT BEDTIME -     CBC with Differential/Platelet -     CMP14+EGFR  Moderate episode of recurrent major depressive disorder (HCC) -     sertraline (ZOLOFT) 50 MG tablet; Take 1 tablet (50 mg total) by mouth at bedtime. -     CBC with Differential/Platelet -     CMP14+EGFR  Other orders -     montelukast (SINGULAIR) 10 MG tablet; TAKE 1 TABLET BY MOUTH EVERYDAY AT BEDTIME -     pravastatin (PRAVACHOL) 40 MG tablet; TAKE 1 TABLET BY MOUTH EVERYDAY AT BEDTIME -     levofloxacin (LEVAQUIN) 500 MG tablet; Take 1 tablet (500 mg total) by mouth daily. For 10 days -     predniSONE (DELTASONE) 10 MG tablet; Take 5 daily for 2 days followed by 4,3,2 and 1 for 2  days each. -     Ibuprofen-Famotidine 800-26.6 MG TABS; Take 1 tablet by mouth 3 (three) times daily.  I have discontinued Euretha Najarro. [place]'s moxifloxacin and predniSONE. I have also changed her Ibuprofen-Famotidine. Additionally, I am having her start on levofloxacin and predniSONE. Lastly, I am having her maintain her acetaminophen, Breo Ellipta, cyclobenzaprine, sodium chloride, ondansetron, omeprazole, Trelegy Ellipta, benzonatate, promethazine, albuterol, levothyroxine, losartan, montelukast, trazodone, sertraline, and pravastatin.  Meds ordered this encounter  Medications  . levothyroxine (SYNTHROID) 100 MCG tablet    Sig: TAKE 1 TABLET BY MOUTH EVERY DAY BEFORE BREAKFAST    Dispense:  90 tablet    Refill:  3  . losartan (COZAAR) 50 MG tablet    Sig: Take 1 tablet (50 mg total) by mouth daily.    Dispense:  90 tablet    Refill:  3  . montelukast (SINGULAIR) 10 MG tablet    Sig: TAKE 1 TABLET BY MOUTH EVERYDAY AT BEDTIME    Dispense:  90 tablet    Refill:  3  . trazodone (DESYREL) 300 MG tablet    Sig: TAKE 1 TABLET BY MOUTH EVERYDAY AT BEDTIME    Dispense:  90 tablet    Refill:  3  . sertraline (ZOLOFT) 50 MG tablet    Sig: Take 1 tablet (50 mg total) by mouth at bedtime.    Dispense:  90 tablet    Refill:  3  . pravastatin (PRAVACHOL) 40 MG tablet    Sig: TAKE 1 TABLET BY MOUTH EVERYDAY AT BEDTIME    Dispense:  90 tablet    Refill:  3  . levofloxacin (LEVAQUIN) 500 MG tablet    Sig: Take 1 tablet (500 mg total) by mouth daily. For 10 days    Dispense:  10 tablet    Refill:  0  . predniSONE (DELTASONE) 10 MG tablet    Sig: Take 5 daily for 2 days followed by 4,3,2 and 1 for 2 days each.    Dispense:  30 tablet    Refill:  0  . Ibuprofen-Famotidine 800-26.6 MG  TABS    Sig: Take 1 tablet by mouth 3 (three) times daily.    Dispense:  270 tablet    Refill:  3    This prescription was filled on 01/11/2021. Any refills authorized will be placed on file.     Follow-up:  Return in about 6 months (around 08/29/2021), or if symptoms worsen or fail to improve.  [name], M.D.

## 2021-02-27 LAB — CMP14+EGFR
ALT: 21 IU/L (ref 0–32)
AST: 20 IU/L (ref 0–40)
Albumin/Globulin Ratio: 1.7 (ref 1.2–2.2)
Albumin: 4.8 g/dL (ref 3.8–4.9)
Alkaline Phosphatase: 122 IU/L — ABNORMAL HIGH (ref 44–121)
BUN/Creatinine Ratio: 18 (ref 9–23)
BUN: 21 mg/dL (ref 6–24)
Bilirubin Total: 0.2 mg/dL (ref 0.0–1.2)
CO2: 26 mmol/L (ref 20–29)
Calcium: 9.6 mg/dL (ref 8.7–10.2)
Chloride: 94 mmol/L — ABNORMAL LOW (ref 96–106)
Creatinine, Ser: 1.14 mg/dL — ABNORMAL HIGH (ref 0.57–1.00)
Globulin, Total: 2.9 g/dL (ref 1.5–4.5)
Glucose: 170 mg/dL — ABNORMAL HIGH (ref 65–99)
Potassium: 4.5 mmol/L (ref 3.5–5.2)
Sodium: 136 mmol/L (ref 134–144)
Total Protein: 7.7 g/dL (ref 6.0–8.5)
eGFR: 56 mL/min/{1.73_m2} — ABNORMAL LOW (ref >59)

## 2021-02-27 LAB — CBC WITH DIFFERENTIAL/PLATELET
Basophils Absolute: 0 10*3/uL (ref 0.0–0.2)
Basos: 0 %
EOS (ABSOLUTE): 0 10*3/uL (ref 0.0–0.4)
Eos: 0 %
Hematocrit: 38.5 % (ref 34.0–46.6)
Hemoglobin: 12 g/dL (ref 11.1–15.9)
Immature Grans (Abs): 0.1 10*3/uL (ref 0.0–0.1)
Immature Granulocytes: 1 %
Lymphocytes Absolute: 1.5 10*3/uL (ref 0.7–3.1)
Lymphs: 13 %
MCH: 26.2 pg — ABNORMAL LOW (ref 26.6–33.0)
MCHC: 31.2 g/dL — ABNORMAL LOW (ref 31.5–35.7)
MCV: 84 fL (ref 79–97)
Monocytes Absolute: 0.7 10*3/uL (ref 0.1–0.9)
Monocytes: 7 %
Neutrophils Absolute: 8.7 10*3/uL — ABNORMAL HIGH (ref 1.4–7.0)
Neutrophils: 79 %
Platelets: 319 10*3/uL (ref 150–450)
RBC: 4.58 x10E6/uL (ref 3.77–5.28)
RDW: 15 % (ref 11.7–15.4)
WBC: 11.1 10*3/uL — ABNORMAL HIGH (ref 3.4–10.8)

## 2021-02-27 LAB — TSH+FREE T4
Free T4: 1.04 ng/dL (ref 0.82–1.77)
TSH: 0.603 u[IU]/mL (ref 0.450–4.500)

## 2021-02-27 LAB — LIPID PANEL
Chol/HDL Ratio: 4.9 ratio — ABNORMAL HIGH (ref 0.0–4.4)
Cholesterol, Total: 188 mg/dL (ref 100–199)
HDL: 38 mg/dL — ABNORMAL LOW (ref >39)
LDL Chol Calc (NIH): 115 mg/dL — ABNORMAL HIGH (ref 0–99)
Triglycerides: 200 mg/dL — ABNORMAL HIGH (ref 0–149)
VLDL Cholesterol Cal: 35 mg/dL (ref 5–40)

## 2021-03-01 ENCOUNTER — Other Ambulatory Visit: Payer: Self-pay | Admitting: Family Medicine

## 2021-03-16 ENCOUNTER — Other Ambulatory Visit: Payer: Self-pay | Admitting: Family

## 2021-03-16 DIAGNOSIS — J4541 Moderate persistent asthma with (acute) exacerbation: Secondary | ICD-10-CM

## 2021-03-17 ENCOUNTER — Other Ambulatory Visit: Payer: Self-pay | Admitting: Family Medicine

## 2021-03-17 NOTE — Progress Notes (Signed)
Synopsis: Referred for cough, dyspnea, possible history of asthma by Morgan Claude, MD  Subjective:   PATIENT ID: [name] GENDER: female DOB: 02-25-1961, MRN: [id number]  Chief Complaint  Patient presents with   Consult    States DOE, productive cough, at times clear sputum, green sputum in mornings, wheezing   [name] with history of GERD on ppi nightly, OSA (not supported by 2013 sleep study but she weighed ~270 lb at the time), asthma dx as a child never has had PFTs, never smoker, who is seen for cough, dyspnea.  Seen by PCP 8/5. Maintained on singulair, trelegy and started on prednisone taper, levaquin. She doesn't note much difference with trelegy as opposed to breo.   She says she has DOE that's been on and off, but lately it has been progressive over last 3 months. DOE to 50 ft currently. She has some accompanying chest pain that is worse with coughing. Pleuritic after a bad coughing spell. Had bronchitis about 3 weeks ago given above tx, she feels tessalon perles were probably more helpful than prednisone. She never took the levaquin. She doesn't note any worsening orthopnea - she always feels smothered when she lies on her back. Some worsening swelling in R>L foot.   Otherwise pertinent review of systems is negative.  Smoked very little when she was in middle school. She works for [employer] in [place] division. 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 She has lived in [place] for a period. She has a cat. No pet bird or hot tub.  No first degree relatives with lung disease.   Past Medical History:  Diagnosis Date   Anemia    hx of with pregnancy    Anxiety    Arthritis    Asthma    Colon polyps    Depression    GERD (gastroesophageal reflux disease)    Headache    occasional headaches    High cholesterol    Hypertension    Lithium toxicity 06/2019   has since resolved after discontinuing    PONV (postoperative nausea  and vomiting)    Shortness of breath dyspnea    due to weight gain    Sleep apnea    has not use CPAP machine in 2 yrs/ DOES NOT KNOW IF SHE NEEDS TO USE C - PAP   Syncope and collapse    due to severe knee pain      Family History  Problem Relation Age of Onset   Alcohol abuse Mother    Arthritis Mother    Heart disease Mother    Hyperlipidemia Father    Hypertension Father    Heart disease Father 44       CABG   Gout Father    Hypertension Sister    Cancer Brother        colon, lung, liver   Cancer Maternal Grandmother        breast   Cancer Daughter        NEUROBLASTOMA     Past Surgical History:  Procedure Laterality Date   BACK SURGERY  2012   lumb fusion   CARPAL TUNNEL RELEASE  2007   both hands   COLONOSCOPY     ENDOMETRIAL ABLATION W/ NOVASURE     EXCISION METACARPAL MASS Right 03/10/2014   Procedure: EXCISION MASS RIGHT HAND FIRST WEB SPACE DORSAL PALMAR INCISION ;  Surgeon: Cindee Salt, MD;  Location: [facility];  Service: Orthopedics;  Laterality: Right;   EXCISIONAL TOTAL KNEE ARTHROPLASTY WITH ANTIBIOTIC SPACERS Left 09/19/2019   Procedure: EXCISIONAL  LEFT TOTAL KNEE ARTHROPLASTY WITH TOTAL KNEE REVISION;  Surgeon: Durene Romanslin, Matthew, MD;  Location: [facility];  Service: Orthopedics;  Laterality: Left;  2 hrs   IRRIGATION AND DEBRIDEMENT KNEE Left 05/23/2014   Procedure: IRRIGATION AND DEBRIDEMENT KNEE;  Surgeon: Shelda PalMatthew D Olin, MD;  Location: [facility];  Service: Orthopedics;  Laterality: Left;  With POLYETHYLENE EXCHANGE   KNEE ARTHROSCOPY     let knee 03/2013    KNEE ARTHROSCOPY WITH MEDIAL MENISECTOMY Right 10/22/2013   Procedure: RIGHT KNEE ARTHROSCOPY WITH MEDIAL MENISECTOMY, abrasion chrondrplasty Brigid Re/CLEAN OUT/CORRECT CURETTEMENT/BONE GRAFT/PROXIMAL TIBIA ;  Surgeon: Jacki Conesonald A Gioffre, MD;  Location: [facility];  Service: Orthopedics;  Laterality: Right;   TOTAL KNEE ARTHROPLASTY Left 04/16/2014   Procedure: LEFT TOTAL KNEE ARTHROPLASTY;  Surgeon: Jacki Conesonald A  Gioffre, MD;  Location: [facility];  Service: Orthopedics;  Laterality: Left;   TOTAL KNEE ARTHROPLASTY Right 12/23/2014   Procedure: TOTAL KNEE ARTHROPLASTY;  Surgeon: Ranee Gosselinonald Gioffre, MD;  Location: [facility];  Service: Orthopedics;  Laterality: Right;   trigger finger release surgeyr      bilateral    TUBAL LIGATION      Social History   Socioeconomic History   Marital status: Married    Spouse name: [name]   Number of children: 2   Years of education: 12   Highest education level: Some college, no degree  Occupational History    Comment: Disabled  Tobacco Use   Smoking status: Never   Smokeless tobacco: Never  Vaping Use   Vaping Use: Never used  Substance and Sexual Activity   Alcohol use: No   Drug use: No   Sexual activity: Not on file  Other Topics Concern   Not on file  Social History Narrative   Lives with her husband who helps with grocery shopping and other transportation needs.  Has two children, Morgan GoslingCharlie and [name] and grandchildren.  There are two outside dogs and several  Horses.    Social Determinants of Health   Financial Resource Strain: Not on file  Food Insecurity: Not on file  Transportation Needs: Not on file  Physical Activity: Not on file  Stress: Not on file  Social Connections: Not on file  Intimate Partner Violence: Not on file     Allergies  Allergen Reactions   Adhesive [Tape] Other (See Comments)    Peeled off skin   Ceclor [Cefaclor] Other (See Comments)    Blisters on hands   Chocolate Other (See Comments)    migraines   Venlafaxine Other (See Comments)    "refuses to take - makes me feel like I am dying"   Lisinopril Cough     Outpatient Medications Prior to Visit  Medication Sig Dispense Refill   acetaminophen (TYLENOL) 500 MG tablet Take 2 tablets (1,000 mg total) by mouth every 8 (eight) hours. 30 tablet 0   albuterol (VENTOLIN HFA) 108 (90 Base) MCG/ACT inhaler TAKE 2 PUFFS BY MOUTH EVERY 6 HOURS AS NEEDED FOR WHEEZE OR SHORTNESS OF  BREATH 18 each 2   Fluticasone-Umeclidin-Vilant (TRELEGY ELLIPTA) 100-62.5-25 MCG/INH AEPB Inhale 1 puff into the lungs daily. 28 each 11   furosemide (LASIX) 20 MG tablet Take 20 mg by mouth.     Ibuprofen-Famotidine 800-26.6 MG TABS Take 1 tablet by mouth 3 (three) times daily. 270 tablet 3   levofloxacin (LEVAQUIN) 500 MG tablet Take 1 tablet (500  mg total) by mouth daily. For 10 days 10 tablet 0   levothyroxine (SYNTHROID) 100 MCG tablet TAKE 1 TABLET BY MOUTH EVERY DAY BEFORE BREAKFAST 90 tablet 3   losartan (COZAAR) 50 MG tablet Take 1 tablet (50 mg total) by mouth daily. 90 tablet 3   methocarbamol (ROBAXIN) 500 MG tablet Take 500 mg by mouth every 6 (six) hours as needed for muscle spasms.     montelukast (SINGULAIR) 10 MG tablet TAKE 1 TABLET BY MOUTH EVERYDAY AT BEDTIME 90 tablet 3   omeprazole (PRILOSEC) 40 MG capsule TAKE 1 CAPSULE BY MOUTH EVERY DAY 90 capsule 3   pravastatin (PRAVACHOL) 40 MG tablet TAKE 1 TABLET BY MOUTH EVERYDAY AT BEDTIME 90 tablet 3   pregabalin (LYRICA) 75 MG capsule Take 75 mg by mouth 2 (two) times daily.     promethazine (PHENERGAN) 25 MG tablet TAKE 1 TABLET BY MOUTH EVERY 6 HOURS AS NEEDED FOR NAUSEA AND VOMITING 30 tablet 2   sertraline (ZOLOFT) 50 MG tablet Take 1 tablet (50 mg total) by mouth at bedtime. 90 tablet 3   sodium chloride (OCEAN) 0.65 % SOLN nasal spray Place 1 spray into both nostrils as needed for congestion. 30 mL 0   trazodone (DESYREL) 300 MG tablet TAKE 1 TABLET BY MOUTH EVERYDAY AT BEDTIME 90 tablet 3   ibuprofen (ADVIL) 800 MG tablet Take 800 mg by mouth every 8 (eight) hours as needed.     benzonatate (TESSALON) 200 MG capsule Take 1 capsule (200 mg total) by mouth 3 (three) times daily as needed. (Patient not taking: Reported on 03/18/2021) 30 capsule 1   cyclobenzaprine (FLEXERIL) 5 MG tablet cyclobenzaprine 5 mg tablet  TAKE 1 TABLET BY MOUTH EVERY 8 HOURS AS NEEDED FOR MUSCLE SPASMS     fluticasone furoate-vilanterol (BREO  ELLIPTA) 200-25 MCG/INH AEPB Inhale 1 puff into the lungs every morning. 1 each 11   ondansetron (ZOFRAN) 4 MG tablet Take 1 tablet (4 mg total) by mouth every 8 (eight) hours as needed for nausea or vomiting. 20 tablet 0   predniSONE (DELTASONE) 10 MG tablet Take 5 daily for 2 days followed by 4,3,2 and 1 for 2 days each. 30 tablet 0   No facility-administered medications prior to visit.       Objective:   Physical Exam:  General appearance: 60 y.o., female, NAD, conversant  Eyes: anicteric sclerae, moist conjunctivae; no lid-lag; PERRL, tracking appropriately HENT: NCAT; oropharynx, MMM, no mucosal ulcerations; normal hard and soft palate. Purulent material behind L TM.  Neck: Trachea midline; no lymphadenopathy, no JVD Lungs: CTAB, no crackles, no wheeze, with normal respiratory effort CV: RRR, no MRGs  Abdomen: Soft, non-tender; non-distended, BS present  Extremities: No peripheral edema, radial and DP pulses present bilaterally  Skin: Normal temperature, turgor and texture; no rash Psych: Appropriate affect Neuro: Alert and oriented to person and place, no focal deficit    Vitals:   03/18/21 1544  BP: (!) 148/82  Pulse: 96  SpO2: 95%  Weight: (!) 350 lb 12.8 oz (159.1 kg)  Height: 5\' 5"  (1.651 m)   95% on RA BMI Readings from Last 3 Encounters:  03/18/21 58.38 kg/m  02/26/21 60.94 kg/m  12/09/20 62.67 kg/m   Wt Readings from Last 3 Encounters:  03/18/21 (!) 350 lb 12.8 oz (159.1 kg)  02/26/21 (!) 344 lb (156 kg)  12/09/20 (!) 353 lb 12.8 oz (160.5 kg)     CBC    Component Value Date/Time   WBC  11.1 (H) 02/26/2021 1634   WBC 13.5 (H) 09/20/2019 0231   RBC 4.58 02/26/2021 1634   RBC 3.36 (L) 09/20/2019 0231   HGB 12.0 02/26/2021 1634   HCT 38.5 02/26/2021 1634   PLT 319 02/26/2021 1634   MCV 84 02/26/2021 1634   MCH 26.2 (L) 02/26/2021 1634   MCH 30.4 09/20/2019 0231   MCHC 31.2 (L) 02/26/2021 1634   MCHC 32.2 09/20/2019 0231   RDW 15.0 02/26/2021  1634   LYMPHSABS 1.5 02/26/2021 1634   MONOABS 0.6 09/11/2019 1143   EOSABS 0.0 02/26/2021 1634   BASOSABS 0.0 02/26/2021 1634    Low level eosinophilia historically  Chest Imaging: CTA Chest 01/2019 reviewed by me and remarkable for 64mm nodule near minor fissure, no LAD, some likely dependent atelectasis.   CXR 12/2019 reviewed by me and unremarkable  06/2019 NM perfusion: no perfusion defect  Pulmonary Functions Testing Results: No flowsheet data found.    Echocardiogram:   TTE 2021: mildly dilated LAD, LV hypertrophy, normal EF.  TTE 2014: G1DD  [facility] stress 2014 report essentially normal       Assessment & Plan:   # Dyspnea on exertion: # Chronic cough: Could well be multifactorial. May have deconditioning and prior TTEs suggestive of diastolic dysfunction but she looks euvolemic today essentially. If there is underlying asthma then chief modifiable factor per our discussion may be post nasal drainage. GERD is well managed with her ppi although her technique could be improved (needs to take every morning before eating).   # Acute otitis media:  Noted on TM examination today.  # Excessive daytime sleepiness: # Snoring: High risk for sleep disordered breathing   Plan: - PFTs in 2 weeks to let effect of steroids wear off - stop trelegy - she says inhalers haven't made much difference and could mask obstruction/BD response on PFTs - augmentin for 10 days for AOM - flonase for component of postnasal drainage, discussed optimal technique and dosing - home sleep study     Omar Person, MD [facility] 03/18/2021 4:31 PM

## 2021-03-18 ENCOUNTER — Encounter: Payer: Self-pay | Admitting: Student

## 2021-03-18 ENCOUNTER — Ambulatory Visit (INDEPENDENT_AMBULATORY_CARE_PROVIDER_SITE_OTHER): Payer: BC Managed Care – PPO | Admitting: Student

## 2021-03-18 ENCOUNTER — Other Ambulatory Visit: Payer: Self-pay

## 2021-03-18 VITALS — BP 148/82 | HR 96 | Ht 65.0 in | Wt 350.8 lb

## 2021-03-18 DIAGNOSIS — R053 Chronic cough: Secondary | ICD-10-CM

## 2021-03-18 DIAGNOSIS — J4541 Moderate persistent asthma with (acute) exacerbation: Secondary | ICD-10-CM | POA: Diagnosis not present

## 2021-03-18 DIAGNOSIS — G4719 Other hypersomnia: Secondary | ICD-10-CM | POA: Diagnosis not present

## 2021-03-18 MED ORDER — AMOXICILLIN-POT CLAVULANATE 875-125 MG PO TABS: 1.0000 | ORAL_TABLET | Freq: Two times a day (BID) | ORAL | 0 refills | Status: DC

## 2021-03-18 MED ORDER — BENZONATATE 200 MG PO CAPS: 200.0000 mg | ORAL_CAPSULE | Freq: Three times a day (TID) | ORAL | 1 refills | Status: DC | PRN

## 2021-03-18 MED ORDER — FLUTICASONE PROPIONATE 50 MCG/ACT NA SUSP: 1.0000 | Freq: Every day | NASAL | 2 refills | Status: DC

## 2021-03-18 NOTE — Patient Instructions (Addendum)
-   Breathing tests (PFTs) in 2 weeks - We will schedule a home sleep study  - augmentin for ear infection 1 tablet twice daily for 10 days - flutter valve 10 slow but firm puffs twice daily  - After your shower, do Flonase - 1 spray on each side of your nose twice a day for first week, then 1 spray on each side.   Instructions for use: If you also use a saline nasal spray or rinse, use that first. Position the head with the chin slightly tucked. Use the right hand to spray into the left nostril and the right hand to spray into the left nostril.   Point the bottle away from the septum of your nose (cartilage that divides the two sides of your nose).  Hold the nostril closed on the opposite side from where you will spray Spray once and gently sniff to pull the medicine into the higher parts of your nose.  Don't sniff too hard as the medicine will drain down the back of your throat instead. Repeat with a second spray on the same side if prescribed. Repeat on the other side of your nose.

## 2021-03-25 ENCOUNTER — Other Ambulatory Visit: Payer: Self-pay | Admitting: Family Medicine

## 2021-03-25 DIAGNOSIS — R110 Nausea: Secondary | ICD-10-CM

## 2021-04-01 ENCOUNTER — Other Ambulatory Visit: Payer: Self-pay

## 2021-04-01 ENCOUNTER — Ambulatory Visit (INDEPENDENT_AMBULATORY_CARE_PROVIDER_SITE_OTHER): Payer: BC Managed Care – PPO | Admitting: Student

## 2021-04-01 DIAGNOSIS — R053 Chronic cough: Secondary | ICD-10-CM

## 2021-04-01 NOTE — Progress Notes (Signed)
Full PFT performed today. °

## 2021-04-01 NOTE — Patient Instructions (Signed)
Full PFT performed today. °

## 2021-04-02 ENCOUNTER — Telehealth: Payer: Self-pay | Admitting: Student

## 2021-04-02 DIAGNOSIS — R0609 Other forms of dyspnea: Secondary | ICD-10-CM

## 2021-04-02 DIAGNOSIS — R0600 Dyspnea, unspecified: Secondary | ICD-10-CM

## 2021-04-02 LAB — PULMONARY FUNCTION TEST
DL/VA % pred: 127 %
DL/VA: 5.36 ml/min/mmHg/L
DLCO cor % pred: 96 %
DLCO cor: 20.4 ml/min/mmHg
DLCO unc % pred: 91 %
DLCO unc: 19.46 ml/min/mmHg
FEF 25-75 Post: 3.4 L/sec
FEF 25-75 Pre: 3.02 L/sec
FEF2575-%Change-Post: 12 %
FEF2575-%Pred-Post: 133 %
FEF2575-%Pred-Pre: 119 %
FEV1-%Change-Post: 3 %
FEV1-%Pred-Post: 82 %
FEV1-%Pred-Pre: 79 %
FEV1-Post: 2.25 L
FEV1-Pre: 2.18 L
FEV1FVC-%Change-Post: 5 %
FEV1FVC-%Pred-Pre: 109 %
FEV6-%Change-Post: -1 %
FEV6-%Pred-Post: 73 %
FEV6-%Pred-Pre: 74 %
FEV6-Post: 2.5 L
FEV6-Pre: 2.54 L
FEV6FVC-%Pred-Post: 103 %
FEV6FVC-%Pred-Pre: 103 %
FVC-%Change-Post: -1 %
FVC-%Pred-Post: 71 %
FVC-%Pred-Pre: 72 %
FVC-Post: 2.5 L
FVC-Pre: 2.54 L
Post FEV1/FVC ratio: 90 %
Post FEV6/FVC ratio: 100 %
Pre FEV1/FVC ratio: 86 %
Pre FEV6/FVC Ratio: 100 %
RV % pred: 72 %
RV: 1.44 L
TLC % pred: 87 %
TLC: 4.53 L

## 2021-04-02 NOTE — Telephone Encounter (Signed)
Called and discussed PFTs which showed nonspecific ventilatory defect, equivocal results for air trapping and elevated DLCO for alveolar volume. We will obtain methacholine challenge and redouble efforts at weight loss.  [facility]

## 2021-04-03 ENCOUNTER — Other Ambulatory Visit: Payer: Self-pay | Admitting: Student

## 2021-04-03 DIAGNOSIS — J4541 Moderate persistent asthma with (acute) exacerbation: Secondary | ICD-10-CM

## 2021-04-04 ENCOUNTER — Other Ambulatory Visit: Payer: Self-pay | Admitting: Family Medicine

## 2021-04-04 ENCOUNTER — Other Ambulatory Visit: Payer: Self-pay | Admitting: Nurse Practitioner

## 2021-04-04 DIAGNOSIS — R110 Nausea: Secondary | ICD-10-CM

## 2021-04-04 DIAGNOSIS — E039 Hypothyroidism, unspecified: Secondary | ICD-10-CM

## 2021-04-06 ENCOUNTER — Institutional Professional Consult (permissible substitution): Payer: Medicare Other | Admitting: Neurology

## 2021-04-14 ENCOUNTER — Other Ambulatory Visit: Payer: Self-pay | Admitting: Family Medicine

## 2021-04-14 DIAGNOSIS — R110 Nausea: Secondary | ICD-10-CM

## 2021-04-20 ENCOUNTER — Other Ambulatory Visit: Payer: Self-pay | Admitting: Family Medicine

## 2021-04-25 ENCOUNTER — Other Ambulatory Visit: Payer: Self-pay | Admitting: Student

## 2021-04-25 DIAGNOSIS — J4541 Moderate persistent asthma with (acute) exacerbation: Secondary | ICD-10-CM

## 2021-05-19 ENCOUNTER — Other Ambulatory Visit: Payer: Self-pay | Admitting: Student

## 2021-05-19 DIAGNOSIS — J4541 Moderate persistent asthma with (acute) exacerbation: Secondary | ICD-10-CM

## 2021-05-20 NOTE — Telephone Encounter (Signed)
Do you approve this refill?

## 2021-06-04 ENCOUNTER — Other Ambulatory Visit: Payer: Self-pay | Admitting: Family Medicine

## 2021-06-04 DIAGNOSIS — R110 Nausea: Secondary | ICD-10-CM

## 2021-06-22 ENCOUNTER — Other Ambulatory Visit: Payer: Self-pay | Admitting: Student

## 2021-06-22 DIAGNOSIS — J4541 Moderate persistent asthma with (acute) exacerbation: Secondary | ICD-10-CM

## 2021-07-07 ENCOUNTER — Other Ambulatory Visit: Payer: Self-pay | Admitting: Family Medicine

## 2021-07-13 ENCOUNTER — Ambulatory Visit (INDEPENDENT_AMBULATORY_CARE_PROVIDER_SITE_OTHER): Payer: Medicare Other

## 2021-07-13 VITALS — Ht 65.0 in | Wt 350.0 lb

## 2021-07-13 DIAGNOSIS — Z0000 Encounter for general adult medical examination without abnormal findings: Secondary | ICD-10-CM

## 2021-07-13 NOTE — Patient Instructions (Signed)
[name] , Thank you for taking time to come for your Medicare Wellness Visit. I appreciate your ongoing commitment to your health goals. Please review the following plan we discussed and let me know if I can assist you in the future.   Screening recommendations/referrals: Colonoscopy: Done 09/26/2011 - Repeat in 10 years - sent referral order to [facility] Mammogram: Due - made appointment for 08/23/21 @ 1:50 Bone Density: Due at age 61 Recommended yearly ophthalmology/optometry visit for glaucoma screening and checkup Recommended yearly dental visit for hygiene and checkup  Vaccinations: Influenza vaccine: Done 04/29/2021 - Repeat annually  Pneumococcal vaccine: Done 12/27/2010, 05/04/2011, & 05/24/2021 Tdap vaccine: Done 02/25/2013 - Repeat in 10 years  Shingles vaccine: Due. Should be covered next year - check at next office visit.  Covid-19: Done 11/14/2019, 12/12/2019, 06/13/2020, 12/15/2020, & 06/01/2021  Advanced directives: Please bring a copy of your health care power of attorney and living will to the office to be added to your chart at your convenience.   Conditions/risks identified: Aim for 30 minutes of exercise or brisk walking each day, drink 6-8 glasses of water and eat lots of fruits and vegetables.   Next appointment: Follow up in one year for your annual wellness visit.   Preventive Care 40-64 Years, Female Preventive care refers to lifestyle choices and visits with your health care provider that can promote health and wellness. What does preventive care include? A yearly physical exam. This is also called an annual well check. Dental exams once or twice a year. Routine eye exams. Ask your health care provider how often you should have your eyes checked. Personal lifestyle choices, including: Daily care of your teeth and gums. Regular physical activity. Eating a healthy diet. Avoiding tobacco and drug use. Limiting alcohol use. Practicing safe sex. Taking low-dose aspirin  daily starting at age 32. Taking vitamin and mineral supplements as recommended by your health care provider. What happens during an annual well check? The services and screenings done by your health care provider during your annual well check will depend on your age, overall health, lifestyle risk factors, and family history of disease. Counseling  Your health care provider may ask you questions about your: Alcohol use. Tobacco use. Drug use. Emotional well-being. Home and relationship well-being. Sexual activity. Eating habits. Work and work Statistician. Method of birth control. Menstrual cycle. Pregnancy history. Screening  You may have the following tests or measurements: Height, weight, and BMI. Blood pressure. Lipid and cholesterol levels. These may be checked every 5 years, or more frequently if you are over 67 years old. Skin check. Lung cancer screening. You may have this screening every year starting at age 29 if you have a 30-pack-year history of smoking and currently smoke or have quit within the past 15 years. Fecal occult blood test (FOBT) of the stool. You may have this test every year starting at age 30. Flexible sigmoidoscopy or colonoscopy. You may have a sigmoidoscopy every 5 years or a colonoscopy every 10 years starting at age 6. Hepatitis C blood test. Hepatitis B blood test. Sexually transmitted disease (STD) testing. Diabetes screening. This is done by checking your blood sugar (glucose) after you have not eaten for a while (fasting). You may have this done every 1-3 years. Mammogram. This may be done every 1-2 years. Talk to your health care provider about when you should start having regular mammograms. This may depend on whether you have a family history of breast cancer. BRCA-related cancer screening. This may  be done if you have a family history of breast, ovarian, tubal, or peritoneal cancers. Pelvic exam and Pap test. This may be done every 3 years  starting at age 23. Starting at age 26, this may be done every 5 years if you have a Pap test in combination with an HPV test. Bone density scan. This is done to screen for osteoporosis. You may have this scan if you are at high risk for osteoporosis. Discuss your test results, treatment options, and if necessary, the need for more tests with your health care provider. Vaccines  Your health care provider may recommend certain vaccines, such as: Influenza vaccine. This is recommended every year. Tetanus, diphtheria, and acellular pertussis (Tdap, Td) vaccine. You may need a Td booster every 10 years. Zoster vaccine. You may need this after age 37. Pneumococcal 13-valent conjugate (PCV13) vaccine. You may need this if you have certain conditions and were not previously vaccinated. Pneumococcal polysaccharide (PPSV23) vaccine. You may need one or two doses if you smoke cigarettes or if you have certain conditions. Talk to your health care provider about which screenings and vaccines you need and how often you need them. This information is not intended to replace advice given to you by your health care provider. Make sure you discuss any questions you have with your health care provider. Document Released: 08/07/2015 Document Revised: 03/30/2016 Document Reviewed: 05/12/2015 Elsevier Interactive Patient Education  2017 [place] Prevention in the Home Falls can cause injuries. They can happen to people of all ages. There are many things you can do to make your home safe and to help prevent falls. What can I do on the outside of my home? Regularly fix the edges of walkways and driveways and fix any cracks. Remove anything that might make you trip as you walk through a door, such as a raised step or threshold. Trim any bushes or trees on the path to your home. Use bright outdoor lighting. Clear any walking paths of anything that might make someone trip, such as rocks or  tools. Regularly check to see if handrails are loose or broken. Make sure that both sides of any steps have handrails. Any raised decks and porches should have guardrails on the edges. Have any leaves, snow, or ice cleared regularly. Use sand or salt on walking paths during winter. Clean up any spills in your garage right away. This includes oil or grease spills. What can I do in the bathroom? Use night lights. Install grab bars by the toilet and in the tub and shower. Do not use towel bars as grab bars. Use non-skid mats or decals in the tub or shower. If you need to sit down in the shower, use a plastic, non-slip stool. Keep the floor dry. Clean up any water that spills on the floor as soon as it happens. Remove soap buildup in the tub or shower regularly. Attach bath mats securely with double-sided non-slip rug tape. Do not have throw rugs and other things on the floor that can make you trip. What can I do in the bedroom? Use night lights. Make sure that you have a light by your bed that is easy to reach. Do not use any sheets or blankets that are too big for your bed. They should not hang down onto the floor. Have a firm chair that has side arms. You can use this for support while you get dressed. Do not have throw rugs and other things  on the floor that can make you trip. What can I do in the kitchen? Clean up any spills right away. Avoid walking on wet floors. Keep items that you use a lot in easy-to-reach places. If you need to reach something above you, use a strong step stool that has a grab bar. Keep electrical cords out of the way. Do not use floor polish or wax that makes floors slippery. If you must use wax, use non-skid floor wax. Do not have throw rugs and other things on the floor that can make you trip. What can I do with my stairs? Do not leave any items on the stairs. Make sure that there are handrails on both sides of the stairs and use them. Fix handrails that are  broken or loose. Make sure that handrails are as long as the stairways. Check any carpeting to make sure that it is firmly attached to the stairs. Fix any carpet that is loose or worn. Avoid having throw rugs at the top or bottom of the stairs. If you do have throw rugs, attach them to the floor with carpet tape. Make sure that you have a light switch at the top of the stairs and the bottom of the stairs. If you do not have them, ask someone to add them for you. What else can I do to help prevent falls? Wear shoes that: Do not have high heels. Have rubber bottoms. Are comfortable and fit you well. Are closed at the toe. Do not wear sandals. If you use a stepladder: Make sure that it is fully opened. Do not climb a closed stepladder. Make sure that both sides of the stepladder are locked into place. Ask someone to hold it for you, if possible. Clearly mark and make sure that you can see: Any grab bars or handrails. First and last steps. Where the edge of each step is. Use tools that help you move around (mobility aids) if they are needed. These include: Canes. Walkers. Scooters. Crutches. Turn on the lights when you go into a dark area. Replace any light bulbs as soon as they burn out. Set up your furniture so you have a clear path. Avoid moving your furniture around. If any of your floors are uneven, fix them. If there are any pets around you, be aware of where they are. Review your medicines with your doctor. Some medicines can make you feel dizzy. This can increase your chance of falling. Ask your doctor what other things that you can do to help prevent falls. This information is not intended to replace advice given to you by your health care provider. Make sure you discuss any questions you have with your health care provider. Document Released: 05/07/2009 Document Revised: 12/17/2015 Document Reviewed: 08/15/2014 Elsevier Interactive Patient Education  2017 [employer].

## 2021-07-13 NOTE — Progress Notes (Signed)
Subjective:   [name] is a 60 y.o. female who presents for Medicare Annual (Subsequent) preventive examination.  Virtual Visit via Telephone Note  I connected with  [name] on 07/13/21 at 10:30 AM EST by telephone and verified that I am speaking with the correct person using two identifiers.  Location: Patient: Home Provider: [facility] Persons participating in the virtual visit: patient/Nurse Health Advisor   I discussed the limitations, risks, security and privacy concerns of performing an evaluation and management service by telephone and the availability of in person appointments. The patient expressed understanding and agreed to proceed.  Interactive audio and video telecommunications were attempted between this nurse and patient, however failed, due to patient having technical difficulties OR patient did not have access to video capability.  We continued and completed visit with audio only.  Some vital signs may be absent or patient reported.   [name] E Morgan Bradsher, LPN   Review of Systems     Cardiac Risk Factors include: sedentary lifestyle;obesity (BMI >30kg/m2);dyslipidemia;hypertension;Other (see comment), Risk factor comments: asthma     Objective:    Today's Vitals   07/13/21 1032  Weight: (!) 350 lb (158.8 kg)  Height: 5\' 5"  (1.651 m)  PainSc: 3    Body mass index is 58.24 kg/m.  Advanced Directives 07/13/2021 07/10/2020 10/21/2019 09/19/2019 09/06/2019 07/09/2019 02/08/2019  Does Patient Have a Medical Advance Directive? No No No No No No No  Copy of Healthcare Power of Attorney in Chart? - - - - - - -  Would patient like information on creating a medical advance directive? No - Patient declined No - Patient declined - No - Patient declined No - Patient declined Yes (ED - Information included in AVS) -    Current Medications (verified) Outpatient Encounter Medications as of 07/13/2021  Medication Sig   acetaminophen (TYLENOL) 500 MG tablet Take 2 tablets  (1,000 mg total) by mouth every 8 (eight) hours.   albuterol (VENTOLIN HFA) 108 (90 Base) MCG/ACT inhaler TAKE 2 PUFFS BY MOUTH EVERY 6 HOURS AS NEEDED FOR WHEEZE OR SHORTNESS OF BREATH   benzonatate (TESSALON) 200 MG capsule TAKE 1 CAPSULE BY MOUTH THREE TIMES A DAY AS NEEDED   fluocinonide cream (LIDEX) 0.05 % Apply 1 application topically 2 (two) times daily.   fluticasone (FLONASE) 50 MCG/ACT nasal spray Place 1 spray into both nostrils daily.   Ibuprofen-Famotidine 800-26.6 MG TABS Take 1 tablet by mouth 3 (three) times daily.   levothyroxine (SYNTHROID) 100 MCG tablet TAKE 1 TABLET BY MOUTH EVERY DAY BEFORE BREAKFAST   losartan (COZAAR) 50 MG tablet Take 1 tablet (50 mg total) by mouth daily.   methocarbamol (ROBAXIN) 500 MG tablet Take 500 mg by mouth every 6 (six) hours as needed for muscle spasms.   montelukast (SINGULAIR) 10 MG tablet TAKE 1 TABLET BY MOUTH EVERYDAY AT BEDTIME   omeprazole (PRILOSEC) 40 MG capsule TAKE 1 CAPSULE BY MOUTH EVERY DAY   pravastatin (PRAVACHOL) 40 MG tablet TAKE 1 TABLET BY MOUTH EVERYDAY AT BEDTIME   pregabalin (LYRICA) 75 MG capsule Take 75 mg by mouth 2 (two) times daily.   promethazine (PHENERGAN) 25 MG tablet TAKE 1 TABLET BY MOUTH EVERY 6 HOURS AS NEEDED FOR NAUSEA AND VOMITING   sertraline (ZOLOFT) 50 MG tablet Take 1 tablet (50 mg total) by mouth at bedtime.   sodium chloride (OCEAN) 0.65 % SOLN nasal spray Place 1 spray into both nostrils as needed for congestion.   trazodone (DESYREL) 300 MG  tablet TAKE 1 TABLET BY MOUTH EVERYDAY AT BEDTIME   Fluticasone-Umeclidin-Vilant (TRELEGY ELLIPTA) 100-62.5-25 MCG/INH AEPB Inhale 1 puff into the lungs daily. (Patient not taking: Reported on 07/13/2021)   furosemide (LASIX) 20 MG tablet Take 20 mg by mouth. (Patient not taking: Reported on 07/13/2021)   [DISCONTINUED] amoxicillin-clavulanate (AUGMENTIN) 875-125 MG tablet Take 1 tablet by mouth 2 (two) times daily.   [DISCONTINUED] levofloxacin (LEVAQUIN) 500  MG tablet Take 1 tablet (500 mg total) by mouth daily. For 10 days (Patient not taking: Reported on 07/13/2021)   No facility-administered encounter medications on file as of 07/13/2021.    Allergies (verified) Adhesive [tape], Ceclor [cefaclor], Chocolate, Venlafaxine, and Lisinopril   History: Past Medical History:  Diagnosis Date   Anemia    hx of with pregnancy    Anxiety    Arthritis    Asthma    Colon polyps    Depression    GERD (gastroesophageal reflux disease)    Headache    occasional headaches    High cholesterol    Hypertension    Lithium toxicity 06/2019   has since resolved after discontinuing    PONV (postoperative nausea and vomiting)    Shortness of breath dyspnea    due to weight gain    Sleep apnea    has not use CPAP machine in 2 yrs/ DOES NOT KNOW IF SHE NEEDS TO USE C - PAP   Syncope and collapse    due to severe knee pain    Past Surgical History:  Procedure Laterality Date   BACK SURGERY  2012   lumb fusion   CARPAL TUNNEL RELEASE  2007   both hands   COLONOSCOPY     ENDOMETRIAL ABLATION W/ NOVASURE     EXCISION METACARPAL MASS Right 03/10/2014   Procedure: EXCISION MASS RIGHT HAND FIRST WEB SPACE DORSAL PALMAR INCISION ;  Surgeon: Cindee Salt, MD;  Location: [facility];  Service: Orthopedics;  Laterality: Right;   EXCISIONAL TOTAL KNEE ARTHROPLASTY WITH ANTIBIOTIC SPACERS Left 09/19/2019   Procedure: EXCISIONAL  LEFT TOTAL KNEE ARTHROPLASTY WITH TOTAL KNEE REVISION;  Surgeon: Durene Romans, MD;  Location: [facility];  Service: Orthopedics;  Laterality: Left;  2 hrs   IRRIGATION AND DEBRIDEMENT KNEE Left 05/23/2014   Procedure: IRRIGATION AND DEBRIDEMENT KNEE;  Surgeon: [name], MD;  Location: [facility];  Service: Orthopedics;  Laterality: Left;  With POLYETHYLENE EXCHANGE   KNEE ARTHROSCOPY     let knee 03/2013    KNEE ARTHROSCOPY WITH MEDIAL MENISECTOMY Right 10/22/2013   Procedure: RIGHT KNEE ARTHROSCOPY WITH MEDIAL MENISECTOMY,  abrasion chrondrplasty Brigid Re OUT/CORRECT CURETTEMENT/BONE GRAFT/PROXIMAL TIBIA ;  Surgeon: Jacki Cones, MD;  Location: [facility];  Service: Orthopedics;  Laterality: Right;   TOTAL KNEE ARTHROPLASTY Left 04/16/2014   Procedure: LEFT TOTAL KNEE ARTHROPLASTY;  Surgeon: Jacki Cones, MD;  Location: [facility];  Service: Orthopedics;  Laterality: Left;   TOTAL KNEE ARTHROPLASTY Right 12/23/2014   Procedure: TOTAL KNEE ARTHROPLASTY;  Surgeon: Ranee Gosselin, MD;  Location: [facility];  Service: Orthopedics;  Laterality: Right;   trigger finger release surgeyr      bilateral    TUBAL LIGATION     Family History  Problem Relation Age of Onset   Alcohol abuse Mother    Arthritis Mother    Heart disease Mother    Hyperlipidemia Father    Hypertension Father    Heart disease Father 27       CABG   Gout  Father    Hypertension Sister    Cancer Brother        colon, lung, liver   Cancer Maternal Grandmother        breast   Cancer Daughter        NEUROBLASTOMA   Social History   Socioeconomic History   Marital status: Married    Spouse name: Trey Paula   Number of children: 2   Years of education: 12   Highest education level: Some college, no degree  Occupational History    Comment: Disabled  Tobacco Use   Smoking status: Never   Smokeless tobacco: Never  Vaping Use   Vaping Use: Never used  Substance and Sexual Activity   Alcohol use: No   Drug use: No   Sexual activity: Not on file  Other Topics Concern   Not on file  Social History Narrative   Lives with her husband who helps with grocery shopping and other transportation needs.     Has two children, Billey Gosling and Clydie Braun and grandchildren - they all live nearby.   There are two outside dogs and several  Horses.    Social Determinants of Health   Financial Resource Strain: Low Risk    Difficulty of Paying Living Expenses: Not hard at all  Food Insecurity: No Food Insecurity   Worried About Programme researcher, broadcasting/film/video in the Last Year:  Never true   Ran Out of Food in the Last Year: Never true  Transportation Needs: No Transportation Needs   Lack of Transportation (Medical): No   Lack of Transportation (Non-Medical): No  Physical Activity: Inactive   Days of Exercise per Week: 0 days   Minutes of Exercise per Session: 0 min  Stress: Stress Concern Present   Feeling of Stress : To some extent  Social Connections: Moderately Integrated   Frequency of Communication with Friends and Family: More than three times a week   Frequency of Social Gatherings with Friends and Family: More than three times a week   Attends Religious Services: Never   Database administrator or Organizations: Yes   Attends Engineer, structural: 1 to 4 times per year   Marital Status: Married    Tobacco Counseling Counseling given: Not Answered   Clinical Intake:  Pre-visit preparation completed: Yes  Pain : 0-10 Pain Score: 3  Pain Type: Chronic pain Pain Location: Back Pain Orientation: Lower Pain Descriptors / Indicators: Aching, Discomfort, Sharp, Sore Pain Onset: More than a month ago Pain Frequency: Intermittent     BMI - recorded: 58.24 Nutritional Status: BMI > 30  Obese Nutritional Risks: None Diabetes: No  How often do you need to have someone help you when you read instructions, pamphlets, or other written materials from your doctor or pharmacy?: 1 - Never  Diabetic? no  Interpreter Needed?: No  Information entered by :: Kalliope Riesen, LPN   Activities of Daily Living In your present state of health, do you have any difficulty performing the following activities: 07/13/2021  Hearing? N  Vision? N  Difficulty concentrating or making decisions? Y  Walking or climbing stairs? Y  Dressing or bathing? N  Doing errands, shopping? N  Preparing Food and eating ? N  Using the Toilet? N  In the past six months, have you accidently leaked urine? Y  Comment mild  Do you have problems with loss of bowel control?  N  Managing your Medications? Y  Comment forgets to take Synthroid several days per week  Managing your Finances? N  Housekeeping or managing your Housekeeping? Y  Some recent data might be hidden    Patient Care Team: [name], MD as PCP - General (Family Medicine) Cottle, Steva Ready., MD as Attending Physician (Psychiatry) Ranee Gosselin, MD as Consulting Physician (Orthopedic Surgery)  Indicate any recent Medical Services you may have received from other than [facility] providers in the past year (date may be approximate).     Assessment:   This is a routine wellness examination for [name].  Hearing/Vision screen Hearing Screening - Comments:: C/o mild hearing difficulties - declines hearing aids Vision Screening - Comments:: Wears rx glasses - up to date with annual eye exams with [facility] in [place]  Dietary issues and exercise activities discussed: Current Exercise Habits: The patient does not participate in regular exercise at present, Exercise limited by: orthopedic condition(s);psychological condition(s);respiratory conditions(s)   Goals Addressed             This Visit's Progress    DIET - INCREASE WATER INTAKE   Not on track    Prevent falls   On track      Depression Screen PHQ 2/9 Scores 07/13/2021 02/26/2021 02/26/2021 12/09/2020 08/18/2020 07/10/2020 04/27/2020  PHQ - 2 Score 2 3 0 PHQ- 9 Score 9 10 - 13 - - 12  Exception Documentation - - - - - - -    Fall Risk Fall Risk  07/13/2021 02/26/2021 02/26/2021 12/09/2020 08/18/2020  Falls in the past year? 1 1 0 1 0  Number falls in past yr: 0 1 - 1 0  Injury with Fall? 1 0 - 0 0  Risk for fall due to : History of fall(s);Medication side effect;Orthopedic patient History of fall(s) - History of fall(s) No Fall Risks  Risk for fall due to: Comment - - - - -  Follow up Falls prevention discussed;Education provided Falls evaluation completed - Falls evaluation completed Falls evaluation completed    FALL RISK  PREVENTION PERTAINING TO THE HOME:  Any stairs in or around the home? No  If so, are there any without handrails? No  Home free of loose throw rugs in walkways, pet beds, electrical cords, etc? Yes  Adequate lighting in your home to reduce risk of falls? Yes   ASSISTIVE DEVICES UTILIZED TO PREVENT FALLS:  Life alert? No  Use of a cane, walker or w/c? No  Grab bars in the bathroom? Yes  Shower chair or bench in shower? Yes  Elevated toilet seat or a handicapped toilet? Yes   TIMED UP AND GO:  Was the test performed? No . Telephonic visit  Cognitive Function:     6CIT Screen 07/13/2021 07/10/2020 07/09/2019  What Year? 0 points 0 points 0 points  What month? 0 points 0 points 0 points  What time? 0 points 0 points 0 points  Count back from 20 0 points 0 points 0 points  Months in reverse 0 points 0 points 2 points  Repeat phrase 4 points 0 points 2 points  Total Score 4 0 4    Immunizations Immunization History  Administered Date(s) Administered   DTaP 07/25/2013   Influenza Inj Mdck Quad Pf 04/29/2021   Influenza Split 05/08/2013   Influenza, Seasonal, Injecte, Preservative Fre 07/07/2014   Influenza,inj,Quad PF,6+ Mos 07/07/2014, 05/14/2018, 04/02/2019, 03/31/2020   Influenza-Unspecified 02/23/2016, 05/25/2021   Moderna Covid-19 Vaccine Bivalent Booster 55yrs & up 06/01/2021   Moderna SARS-COV2 Booster Vaccination 12/15/2020   Moderna Sars-Covid-2 Vaccination  11/14/2019, 12/12/2019, 06/13/2020   PNEUMOCOCCAL CONJUGATE-20 05/24/2021   Pneumococcal Conjugate-13 12/27/2010   Pneumococcal Polysaccharide-23 05/04/2011   Pneumococcal-Unspecified 05/04/2011, 05/25/2021   Tdap 02/25/2013    TDAP status: Up to date  Flu Vaccine status: Up to date  Pneumococcal vaccine status: Up to date  Covid-19 vaccine status: Completed vaccines  Qualifies for Shingles Vaccine? Yes   Zostavax completed Yes   Shingrix Completed?: No.    Education has been provided regarding the  importance of this vaccine. Patient has been advised to call insurance company to determine out of pocket expense if they have not yet received this vaccine. Advised may also receive vaccine at local pharmacy or Health Dept. Verbalized acceptance and understanding.  Screening Tests Health Maintenance  Topic Date Due   PAP SMEAR-Modifier  Never done   Zoster Vaccines- Shingrix (1 of 2) Never done   MAMMOGRAM  06/19/2014   COLONOSCOPY (Pts 45-48yrs Insurance coverage will need to be confirmed)  09/25/2021   TETANUS/TDAP  02/26/2023   Pneumococcal Vaccine 29-79 Years old  Completed   INFLUENZA VACCINE  Completed   COVID-19 Vaccine  Completed   Hepatitis C Screening  Completed   HIV Screening  Completed   HPV VACCINES  Aged Out    Health Maintenance  Health Maintenance Due  Topic Date Due   PAP SMEAR-Modifier  Never done   Zoster Vaccines- Shingrix (1 of 2) Never done   MAMMOGRAM  06/19/2014    Colorectal cancer screening: Referral to GI placed 06/2021. Pt aware the office will call re: appt.  Mammogram status: Ordered 07/13/21. Pt provided with contact info and advised to call to schedule appt.   Bone Density due at age 49  Lung Cancer Screening: (Low Dose CT Chest recommended if Age 61-80 years, 30 pack-year currently smoking OR have quit w/in 15years.) does not qualify.  Additional Screening:  Hepatitis C Screening: does qualify; Completed 06/24/2015  Vision Screening: Recommended annual ophthalmology exams for early detection of glaucoma and other disorders of the eye. Is the patient up to date with their annual eye exam?  Yes  Who is the provider or what is the name of the office in which the patient attends annual eye exams? [facility] in [place] If pt is not established with a provider, would they like to be referred to a provider to establish care? No .   Dental Screening: Recommended annual dental exams for proper oral hygiene  Community Resource Referral /  Chronic Care Management: CRR required this visit?  No   CCM required this visit?  No      Plan:     I have personally reviewed and noted the following in the patients chart:   Medical and social history Use of alcohol, tobacco or illicit drugs  Current medications and supplements including opioid prescriptions.  Functional ability and status Nutritional status Physical activity Advanced directives List of other physicians Hospitalizations, surgeries, and ER visits in previous 12 months Vitals Screenings to include cognitive, depression, and falls Referrals and appointments  In addition, I have reviewed and discussed with patient certain preventive protocols, quality metrics, and best practice recommendations. A written personalized care plan for preventive services as well as general preventive health recommendations were provided to patient.     [place] Constable, LPN   [id number]/04/[id number]   Nurse Notes: She admits to forgetting to take Levothyroxine several days per week - we discussed sitting it on bedside table with water so she can take before getting  out of bed.

## 2021-07-14 NOTE — Telephone Encounter (Signed)
Ibuprofen-Famotidine 800-26.6MG  tablets PA started   Key: [id number] Sent to Plan today  (Without DX)

## 2021-07-16 NOTE — Telephone Encounter (Signed)
Resent to plan today with clinical questions answered

## 2021-07-21 MED ORDER — IBUPROFEN-FAMOTIDINE 800-26.6 MG PO TABS: 1.0000 | ORAL_TABLET | Freq: Three times a day (TID) | ORAL | 3 refills | Status: DC

## 2021-07-21 NOTE — Addendum Note (Signed)
Addended by: Julious Payer D on: 07/21/2021 11:42 AM   Modules accepted: Orders

## 2021-07-21 NOTE — Telephone Encounter (Signed)
Refill failed. resent °

## 2021-07-21 NOTE — Telephone Encounter (Signed)
RX resent with note to pharm - aware PA approved    Called and on VM as well

## 2021-07-23 ENCOUNTER — Other Ambulatory Visit: Payer: Self-pay | Admitting: Family Medicine

## 2021-07-23 DIAGNOSIS — Z1231 Encounter for screening mammogram for malignant neoplasm of breast: Secondary | ICD-10-CM

## 2021-07-28 ENCOUNTER — Other Ambulatory Visit: Payer: Self-pay | Admitting: Family Medicine

## 2021-07-28 DIAGNOSIS — R110 Nausea: Secondary | ICD-10-CM

## 2021-08-17 NOTE — Progress Notes (Signed)
Synopsis: Referred for cough, dyspnea, possible history of asthma by Mechele ClaudeStacks, [place], MD  Subjective:   PATIENT ID: [name] GENDER: female DOB: 05-27-1961, MRN: [id number]  Chief Complaint  Patient presents with   Follow-up    Cough comes and goes. She states it starts as a tickle in her throat. She occ will cough up some yellow/green sputum. She rarely uses her albuterol. Tessalon was helping and she would like refill.    [name] with history of GERD on ppi nightly, OSA (not supported by 2013 sleep study but she weighed ~270 lb at the time), asthma dx as a child never has had PFTs, never smoker, who is seen for cough, dyspnea.  Seen by PCP 8/5. Maintained on singulair, trelegy and started on prednisone taper, levaquin. She doesn't note much difference with trelegy as opposed to breo.   She says she has DOE that's been on and off, but lately it has been progressive over last 3 months. DOE to 50 ft currently. She has some accompanying chest pain that is worse with coughing. Pleuritic after a bad coughing spell. Had bronchitis about 3 weeks ago given above tx, she feels tessalon perles were probably more helpful than prednisone. She never took the levaquin. She doesn't note any worsening orthopnea - she always feels smothered when she lies on her back. Some worsening swelling in R>L foot.   Smoked very little when she was in middle school. She works for [employer] in [employer] division. 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 [place] a period. She has a cat. No pet bird or hot tub.  No first degree relatives with lung disease.   Interval HPI: Last seen by me 03/18/21 at which point ordered PFTs (nonspecific ventilatory defect, equivocal for air trapping), home sleep study (not yet performed), augmentin for AOM, and flonase for PND. Tried flonase for <1 week, just kind of a nuisance to try it. Some PND in the morning most mornings  though.   She took herself off of ppi due to nausea when she was on it. She notes no difference with regard to reflux. Takes ibuprofen-famotidine every 2 days or so.   Otherwise pertinent review of systems is negative.  Past Medical History:  Diagnosis Date   Anemia    hx of with pregnancy    Anxiety    Arthritis    Asthma    Colon polyps    Depression    GERD (gastroesophageal reflux disease)    Headache    occasional headaches    High cholesterol    Hypertension    Lithium toxicity 06/2019   has since resolved after discontinuing    PONV (postoperative nausea and vomiting)    Shortness of breath dyspnea    due to weight gain    Sleep apnea    has not use CPAP machine in 2 yrs/ DOES NOT KNOW IF SHE NEEDS TO USE C - PAP   Syncope and collapse    due to severe knee pain      Family History  Problem Relation Age of Onset   Alcohol abuse Mother    Arthritis Mother    Heart disease Mother    Hyperlipidemia Father    Hypertension Father    Heart disease Father 175       CABG   Gout Father    Hypertension Sister    Cancer Brother  colon, lung, liver   Cancer Maternal Grandmother        breast   Cancer Daughter        NEUROBLASTOMA     Past Surgical History:  Procedure Laterality Date   BACK SURGERY  2012   lumb fusion   CARPAL TUNNEL RELEASE  2007   both hands   COLONOSCOPY     ENDOMETRIAL ABLATION W/ NOVASURE     EXCISION METACARPAL MASS Right 03/10/2014   Procedure: EXCISION MASS RIGHT HAND FIRST WEB SPACE DORSAL PALMAR INCISION ;  Surgeon: Cindee Salt, MD;  Location: [facility];  Service: Orthopedics;  Laterality: Right;   EXCISIONAL TOTAL KNEE ARTHROPLASTY WITH ANTIBIOTIC SPACERS Left 09/19/2019   Procedure: EXCISIONAL  LEFT TOTAL KNEE ARTHROPLASTY WITH TOTAL KNEE REVISION;  Surgeon: Durene Romans, MD;  Location: [facility];  Service: Orthopedics;  Laterality: Left;  2 hrs   IRRIGATION AND DEBRIDEMENT KNEE Left 05/23/2014   Procedure:  IRRIGATION AND DEBRIDEMENT KNEE;  Surgeon: [name], MD;  Location: [facility];  Service: Orthopedics;  Laterality: Left;  With POLYETHYLENE EXCHANGE   KNEE ARTHROSCOPY     let knee 03/2013    KNEE ARTHROSCOPY WITH MEDIAL MENISECTOMY Right 10/22/2013   Procedure: RIGHT KNEE ARTHROSCOPY WITH MEDIAL MENISECTOMY, abrasion chrondrplasty Brigid Re OUT/CORRECT CURETTEMENT/BONE GRAFT/PROXIMAL TIBIA ;  Surgeon: Jacki Cones, MD;  Location: [facility];  Service: Orthopedics;  Laterality: Right;   TOTAL KNEE ARTHROPLASTY Left 04/16/2014   Procedure: LEFT TOTAL KNEE ARTHROPLASTY;  Surgeon: Jacki Cones, MD;  Location: [facility];  Service: Orthopedics;  Laterality: Left;   TOTAL KNEE ARTHROPLASTY Right 12/23/2014   Procedure: TOTAL KNEE ARTHROPLASTY;  Surgeon: Ranee Gosselin, MD;  Location: [facility];  Service: Orthopedics;  Laterality: Right;   trigger finger release surgeyr      bilateral    TUBAL LIGATION      Social History   Socioeconomic History   Marital status: Married    Spouse name: Trey Paula   Number of children: 2   Years of education: 12   Highest education level: Some college, no degree  Occupational History    Comment: Disabled  Tobacco Use   Smoking status: Never   Smokeless tobacco: Never  Vaping Use   Vaping Use: Never used  Substance and Sexual Activity   Alcohol use: No   Drug use: No   Sexual activity: Not on file  Other Topics Concern   Not on file  Social History Narrative   Lives with her husband who helps with grocery shopping and other transportation needs.     Has two children, Billey Gosling and [name] and grandchildren - they all live nearby.   There are two outside dogs and several  Horses.    Social Determinants of Health   Financial Resource Strain: Low Risk    Difficulty of Paying Living Expenses: Not hard at all  Food Insecurity: No Food Insecurity   Worried About Programme researcher, broadcasting/film/video in the Last Year: Never true   Ran Out of Food in the Last Year: Never true   Transportation Needs: No Transportation Needs   Lack of Transportation (Medical): No   Lack of Transportation (Non-Medical): No  Physical Activity: Inactive   Days of Exercise per Week: 0 days   Minutes of Exercise per Session: 0 min  Stress: Stress Concern Present   Feeling of Stress : To some extent  Social Connections: Moderately Integrated   Frequency of Communication with Friends and Family: More than  three times a week   Frequency of Social Gatherings with Friends and Family: More than three times a week   Attends Religious Services: Never   Database administratorActive Member of Clubs or Organizations: Yes   Attends Engineer, structuralClub or Organization Meetings: 1 to 4 times per year   Marital Status: Married  Catering managerntimate Partner Violence: Not At Risk   Fear of Current or Ex-Partner: No   Emotionally Abused: No   Physically Abused: No   Sexually Abused: No     Allergies  Allergen Reactions   Adhesive [Tape] Other (See Comments)    Peeled off skin   Ceclor [Cefaclor] Other (See Comments)    Blisters on hands   Chocolate Other (See Comments)    migraines   Venlafaxine Other (See Comments)    "refuses to take - makes me feel like I am dying"   Lisinopril Cough     Outpatient Medications Prior to Visit  Medication Sig Dispense Refill   acetaminophen (TYLENOL) 500 MG tablet Take 2 tablets (1,000 mg total) by mouth every 8 (eight) hours. 30 tablet 0   albuterol (VENTOLIN HFA) 108 (90 Base) MCG/ACT inhaler TAKE 2 PUFFS BY MOUTH EVERY 6 HOURS AS NEEDED FOR WHEEZE OR SHORTNESS OF BREATH 18 each 2   fluocinonide cream (LIDEX) 0.05 % Apply 1 application topically 2 (two) times daily.     Ibuprofen-Famotidine 800-26.6 MG TABS Take 1 tablet by mouth 3 (three) times daily. 270 tablet 3   levothyroxine (SYNTHROID) 100 MCG tablet TAKE 1 TABLET BY MOUTH EVERY DAY BEFORE BREAKFAST 90 tablet 3   losartan (COZAAR) 50 MG tablet Take 1 tablet (50 mg total) by mouth daily. 90 tablet 3   methocarbamol (ROBAXIN) 500 MG tablet  Take 500 mg by mouth every 6 (six) hours as needed for muscle spasms.     montelukast (SINGULAIR) 10 MG tablet TAKE 1 TABLET BY MOUTH EVERYDAY AT BEDTIME 90 tablet 3   omeprazole (PRILOSEC) 40 MG capsule TAKE 1 CAPSULE BY MOUTH EVERY DAY 90 capsule 3   pravastatin (PRAVACHOL) 40 MG tablet TAKE 1 TABLET BY MOUTH EVERYDAY AT BEDTIME 90 tablet 3   pregabalin (LYRICA) 75 MG capsule Take 75 mg by mouth 2 (two) times daily.     promethazine (PHENERGAN) 25 MG tablet TAKE 1 TABLET BY MOUTH EVERY 6 HOURS AS NEEDED FOR NAUSEA AND VOMITING 30 tablet 2   sertraline (ZOLOFT) 50 MG tablet Take 1 tablet (50 mg total) by mouth at bedtime. 90 tablet 3   trazodone (DESYREL) 300 MG tablet TAKE 1 TABLET BY MOUTH EVERYDAY AT BEDTIME 90 tablet 3   benzonatate (TESSALON) 200 MG capsule TAKE 1 CAPSULE BY MOUTH THREE TIMES A DAY AS NEEDED (Patient not taking: Reported on 08/18/2021) 30 capsule 1   fluticasone (FLONASE) 50 MCG/ACT nasal spray Place 1 spray into both nostrils daily. 16 g 2   Fluticasone-Umeclidin-Vilant (TRELEGY ELLIPTA) 100-62.5-25 MCG/INH AEPB Inhale 1 puff into the lungs daily. (Patient not taking: Reported on 07/13/2021) 28 each 11   furosemide (LASIX) 20 MG tablet Take 20 mg by mouth. (Patient not taking: Reported on 07/13/2021)     sodium chloride (OCEAN) 0.65 % SOLN nasal spray Place 1 spray into both nostrils as needed for congestion. 30 mL 0   No facility-administered medications prior to visit.       Objective:   Physical Exam:  General appearance: 61 y.o., female, NAD, conversant  Eyes: anicteric sclerae; PERRL, tracking appropriately HENT: NCAT; MMM Neck: Trachea midline; no lymphadenopathy,  no JVD Lungs: CTAB, no crackles, no wheeze, with normal respiratory effort CV: RRR, no murmur  Abdomen: Soft, non-tender; non-distended, BS present  Extremities: No peripheral edema, warm Skin: Normal turgor and texture; no rash Psych: Appropriate affect Neuro: Alert and oriented to person and  place, no focal deficit     Vitals:   08/18/21 1418  BP: 138/74  Pulse: 82  Temp: 98.2 F (36.8 C)  TempSrc: Oral  SpO2: 95%  Weight: (!) 337 lb (152.9 kg)  Height: 5\' 5"  (1.651 m)    95% on RA BMI Readings from Last 3 Encounters:  08/18/21 56.08 kg/m  07/13/21 58.24 kg/m  03/18/21 58.38 kg/m   Wt Readings from Last 3 Encounters:  08/18/21 (!) 337 lb (152.9 kg)  07/13/21 (!) 350 lb (158.8 kg)  03/18/21 (!) 350 lb 12.8 oz (159.1 kg)     CBC    Component Value Date/Time   WBC 11.1 (H) 02/26/2021 1634   WBC 13.5 (H) 09/20/2019 0231   RBC 4.58 02/26/2021 1634   RBC 3.36 (L) 09/20/2019 0231   HGB 12.0 02/26/2021 1634   HCT 38.5 02/26/2021 1634   PLT 319 02/26/2021 1634   MCV 84 02/26/2021 1634   MCH 26.2 (L) 02/26/2021 1634   MCH 30.4 09/20/2019 0231   MCHC 31.2 (L) 02/26/2021 1634   MCHC 32.2 09/20/2019 0231   RDW 15.0 02/26/2021 1634   LYMPHSABS 1.5 02/26/2021 1634   MONOABS 0.6 09/11/2019 1143   EOSABS 0.0 02/26/2021 1634   BASOSABS 0.0 02/26/2021 1634    Low level eosinophilia historically  Chest Imaging: CTA Chest 01/2019 reviewed by me and remarkable for 90mm nodule near minor fissure, no LAD, some likely dependent atelectasis.   CXR 12/2019 reviewed by me and unremarkable  06/2019 NM perfusion: no perfusion defect  Pulmonary Functions Testing Results: PFT Results Latest Ref Rng & Units 04/01/2021  FVC-Pre L 2.54  FVC-Predicted Pre % 72  FVC-Post L 2.50  FVC-Predicted Post % 71  Pre FEV1/FVC % % 86  Post FEV1/FCV % % 90  FEV1-Pre L 2.18  FEV1-Predicted Pre % 79  FEV1-Post L 2.25  DLCO uncorrected ml/min/mmHg 19.46  DLCO UNC% % 91  DLCO corrected ml/min/mmHg 20.40  DLCO COR %Predicted % 96  DLVA Predicted % 127  TLC L 4.53  TLC % Predicted % 87  RV % Predicted % 72    PFTs reviewed by me showing nonspecific ventilatory defect, equivocal results for air trapping and elevated DLCO for alveolar volume  Echocardiogram:   TTE 2021: mildly  dilated LAD, LV hypertrophy, normal EF.  TTE 2014: G1DD  [facility] stress 2014 report essentially normal       Assessment & Plan:   # Chronic cough: PND and gerd are probably still not fully addressed for her. Needs to restart flonase. Perhaps intolerant of ppi (says she had nausea).   # Dyspnea on exertion: Could well be multifactorial. May have deconditioning and prior TTEs suggestive of diastolic dysfunction but she looks euvolemic today essentially. Asthma possible but less likely based on recent PFT.  # Excessive daytime sleepiness: # Snoring: High risk for sleep disordered breathing   Plan: - flonase for component of postnasal drainage, discussed optimal technique and dosing - famotidine 20 BID 30 min before meals on days she doesn't take her ibuprofen-famotidine - tessalon perles - RTC 6 weeks if no improvement then pursue methacholine challenge - home sleep study     2015, MD [facility]  08/18/2021 2:23 PM

## 2021-08-18 ENCOUNTER — Encounter: Payer: Self-pay | Admitting: Student

## 2021-08-18 ENCOUNTER — Other Ambulatory Visit: Payer: Self-pay

## 2021-08-18 ENCOUNTER — Ambulatory Visit: Payer: Medicare HMO | Admitting: Student

## 2021-08-18 VITALS — BP 138/74 | HR 82 | Temp 98.2°F | Ht 65.0 in | Wt 337.0 lb

## 2021-08-18 DIAGNOSIS — J4541 Moderate persistent asthma with (acute) exacerbation: Secondary | ICD-10-CM

## 2021-08-18 DIAGNOSIS — R0609 Other forms of dyspnea: Secondary | ICD-10-CM | POA: Diagnosis not present

## 2021-08-18 DIAGNOSIS — R053 Chronic cough: Secondary | ICD-10-CM

## 2021-08-18 MED ORDER — FAMOTIDINE 20 MG PO TABS: 20.0000 mg | ORAL_TABLET | Freq: Two times a day (BID) | ORAL | 3 refills | Status: DC

## 2021-08-18 MED ORDER — FLUTICASONE PROPIONATE 50 MCG/ACT NA SUSP: 1.0000 | Freq: Every day | NASAL | 2 refills | Status: DC

## 2021-08-18 MED ORDER — BENZONATATE 200 MG PO CAPS: ORAL_CAPSULE | ORAL | 3 refills | Status: DC

## 2021-08-18 NOTE — Patient Instructions (Signed)
-   On days you do not take your ibuprofen-famotidine, then take famotidine 20 mg twice daily 30 minutes before breakfast, dinner - flonase 1 spray each nostril daily - tessalon perles refilled - see you in 6 weeks

## 2021-08-23 ENCOUNTER — Inpatient Hospital Stay: Admission: RE | Admit: 2021-08-23 | Payer: Medicare Other | Source: Ambulatory Visit

## 2021-08-24 ENCOUNTER — Encounter: Payer: Self-pay | Admitting: Student

## 2021-08-25 ENCOUNTER — Encounter: Payer: Self-pay | Admitting: Family Medicine

## 2021-08-25 ENCOUNTER — Ambulatory Visit (INDEPENDENT_AMBULATORY_CARE_PROVIDER_SITE_OTHER): Payer: Medicare HMO | Admitting: Family Medicine

## 2021-08-25 VITALS — BP 142/74 | HR 79 | Temp 97.6°F | Ht 65.0 in | Wt 337.4 lb

## 2021-08-25 DIAGNOSIS — E039 Hypothyroidism, unspecified: Secondary | ICD-10-CM

## 2021-08-25 DIAGNOSIS — I10 Essential (primary) hypertension: Secondary | ICD-10-CM | POA: Diagnosis not present

## 2021-08-25 DIAGNOSIS — Z23 Encounter for immunization: Secondary | ICD-10-CM

## 2021-08-25 DIAGNOSIS — M170 Bilateral primary osteoarthritis of knee: Secondary | ICD-10-CM

## 2021-08-25 DIAGNOSIS — E782 Mixed hyperlipidemia: Secondary | ICD-10-CM

## 2021-08-25 DIAGNOSIS — K219 Gastro-esophageal reflux disease without esophagitis: Secondary | ICD-10-CM | POA: Diagnosis not present

## 2021-08-25 NOTE — Progress Notes (Addendum)
Subjective:  Patient ID: [name], female    DOB: [birth year]  Age: 61 y.o. MRN: [id number]  CC: Medical Management of Chronic Issues   HPI [name] presents for  presents for  follow-up of hypertension. Patient has no history of headache chest pain or shortness of breath or recent cough. Patient also denies symptoms of TIA such as focal numbness or weakness. Patient denies side effects from medication. States taking it regularly.  follow-up on  thyroid. The patient has a history of hypothyroidism for many years. It has been stable recently. Pt. denies any change in  voice, loss of hair, heat or cold intolerance. Energy level has been adequate to good. Patient denies constipation and diarrhea. No myxedema. Medication is as noted below. Verified that pt is taking it daily on an empty stomach. Well tolerated.  in for follow-up of elevated cholesterol. Doing well without complaints on current medication. Denies side effects of statin including myalgia and arthralgia and nausea. Currently no chest pain, shortness of breath or other cardiovascular related symptoms noted.      Depression screen [facility] 2/9 08/30/2021 08/25/2021 08/25/2021  Decreased Interest 2 2 0  Down, Depressed, Hopeless 2 2 0  PHQ - 2 Score 4 4 0  Altered sleeping 2 2 -  Tired, decreased energy 2 3 -  Change in appetite 2 3 -  Feeling bad or failure about yourself  3 3 -  Trouble concentrating 1 2 -  Moving slowly or fidgety/restless 0 1 -  Suicidal thoughts 0 0 -  PHQ-9 Score 14 18 -  Difficult doing work/chores Not difficult at all Somewhat difficult -  Some recent data might be hidden    History [name] has a past medical history of Anemia, Anxiety, ARF (acute renal failure) ([place]) (07/16/2019), Arthritis, Asthma, Colon polyps, Depression, GERD (gastroesophageal reflux disease), Headache, High cholesterol, Hypertension, Lithium toxicity (06/2019), PONV (postoperative nausea and vomiting), Shortness of breath dyspnea,  Sleep apnea, and Syncope and collapse.   She has a past surgical history that includes Endometrial ablation w/ novasure; Carpal tunnel release (2007); trigger finger release surgeyr ; Tubal ligation; Knee arthroscopy; Knee arthroscopy with medial menisectomy (Right, 10/22/2013); Back surgery (2012); Colonoscopy; Excision metacarpal mass (Right, 03/10/2014); Total knee arthroplasty (Left, 04/16/2014); Irrigation and debridement knee (Left, 05/23/2014); Total knee arthroplasty (Right, 12/23/2014); and Excisional total knee arthroplasty with antibiotic spacers (Left, 09/19/2019).   Her family history includes Alcohol abuse in her mother; Arthritis in her mother; Cancer in her brother, daughter, and maternal grandmother; Gout in her father; Heart disease in her mother; Heart disease (age of onset: 33) in her father; Hyperlipidemia in her father; Hypertension in her father and sister.She reports that she has never smoked. She has never used smokeless tobacco. She reports that she does not drink alcohol and does not use drugs.    ROS Review of Systems  Constitutional: Negative.   HENT: Negative.    Eyes:  Negative for visual disturbance.  Respiratory:  Negative for shortness of breath.   Cardiovascular:  Negative for chest pain.  Gastrointestinal:  Negative for abdominal pain.  Musculoskeletal:  Positive for arthralgias (knee pain).   Objective:  BP (!) 142/74    Pulse 79    Temp 97.6 F (36.4 C)    Ht $R'5\' 5"'JL$  (1.651 m)    Wt (!) 337 lb 6.4 oz (153 kg)    SpO2 94%    BMI 56.15 kg/m   BP Readings from Last 3 Encounters:  08/30/21 (!) 188/88  08/25/21 (!) 142/74  08/18/21 138/74    Wt Readings from Last 3 Encounters:  08/30/21 (!) 327 lb (148.3 kg)  08/25/21 (!) 337 lb 6.4 oz (153 kg)  08/18/21 (!) 337 lb (152.9 kg)     Physical Exam Constitutional:      General: She is not in acute distress.    Appearance: She is well-developed.  Cardiovascular:     Rate and Rhythm: Normal rate and regular  rhythm.  Pulmonary:     Breath sounds: Normal breath sounds.  Musculoskeletal:        General: Normal range of motion.  Skin:    General: Skin is warm and dry.  Neurological:     Mental Status: She is alert and oriented to person, place, and time.      Assessment & Plan:   [name] was seen today for medical management of chronic issues.  Diagnoses and all orders for this visit:  Hypothyroidism, unspecified type -     TSH + free T4  Essential hypertension, benign -     CBC with Differential/Platelet -     CMP14+EGFR  Mixed hyperlipidemia -     Lipid panel  Gastroesophageal reflux disease, unspecified whether esophagitis present -     omeprazole (PRILOSEC) 40 MG capsule; TAKE 1 CAPSULE BY MOUTH EVERY DAY  Need for shingles vaccine -     Varicella-zoster vaccine IM (Shingrix)  Primary osteoarthritis of both knees  Other orders -     nabumetone (RELAFEN) 500 MG tablet; Take 2 tablets (1,000 mg total) by mouth 2 (two) times daily. For muscle and joint pain       I have discontinued [name]. [name]'s Ibuprofen-Famotidine. I am also having her start on nabumetone. Additionally, I am having her maintain her acetaminophen, losartan, montelukast, sertraline, pravastatin, albuterol, pregabalin, methocarbamol, levothyroxine, fluocinonide cream, promethazine, benzonatate, fluticasone, famotidine, and omeprazole.  Allergies as of 08/25/2021       Reactions   Adhesive [tape] Other (See Comments)   Peeled off skin   Ceclor [cefaclor] Other (See Comments)   Blisters on hands   Chocolate Other (See Comments)   migraines   Venlafaxine Other (See Comments)   "refuses to take - makes me feel like I am dying"   Lisinopril Cough        Medication List        Accurate as of August 25, 2021 11:59 PM. If you have any questions, ask your nurse or doctor.          STOP taking these medications    Ibuprofen-Famotidine 800-26.6 MG Tabs Stopped by: [name], MD        TAKE these medications    acetaminophen 500 MG tablet Commonly known as: TYLENOL Take 2 tablets (1,000 mg total) by mouth every 8 (eight) hours.   albuterol 108 (90 Base) MCG/ACT inhaler Commonly known as: VENTOLIN HFA TAKE 2 PUFFS BY MOUTH EVERY 6 HOURS AS NEEDED FOR WHEEZE OR SHORTNESS OF BREATH   benzonatate 200 MG capsule Commonly known as: TESSALON TAKE 1 CAPSULE BY MOUTH THREE TIMES A DAY AS NEEDED   famotidine 20 MG tablet Commonly known as: PEPCID Take 1 tablet (20 mg total) by mouth 2 (two) times daily.   fluocinonide cream 0.05 % Commonly known as: LIDEX Apply 1 application topically 2 (two) times daily.   fluticasone 50 MCG/ACT nasal spray Commonly known as: FLONASE Place 1 spray into both nostrils daily.   levothyroxine 100 MCG tablet  Commonly known as: SYNTHROID TAKE 1 TABLET BY MOUTH EVERY DAY BEFORE BREAKFAST   losartan 50 MG tablet Commonly known as: COZAAR Take 1 tablet (50 mg total) by mouth daily.   methocarbamol 500 MG tablet Commonly known as: ROBAXIN Take 500 mg by mouth every 6 (six) hours as needed for muscle spasms.   montelukast 10 MG tablet Commonly known as: SINGULAIR TAKE 1 TABLET BY MOUTH EVERYDAY AT BEDTIME   nabumetone 500 MG tablet Commonly known as: RELAFEN Take 2 tablets (1,000 mg total) by mouth 2 (two) times daily. For muscle and joint pain Started by: [name], MD   omeprazole 40 MG capsule Commonly known as: PRILOSEC TAKE 1 CAPSULE BY MOUTH EVERY DAY Started by: [name], MD   pravastatin 40 MG tablet Commonly known as: PRAVACHOL TAKE 1 TABLET BY MOUTH EVERYDAY AT BEDTIME   pregabalin 75 MG capsule Commonly known as: LYRICA Take 75 mg by mouth 2 (two) times daily.   promethazine 25 MG tablet Commonly known as: PHENERGAN TAKE 1 TABLET BY MOUTH EVERY 6 HOURS AS NEEDED FOR NAUSEA AND VOMITING   sertraline 50 MG tablet Commonly known as: ZOLOFT Take 1 tablet (50 mg total) by mouth at bedtime.    trazodone 300 MG tablet Commonly known as: DESYREL TAKE 1 TABLET BY MOUTH EVERYDAY AT BEDTIME         Follow-up: Return in about 6 months (around 02/22/2022).  [name], M.D.

## 2021-08-27 ENCOUNTER — Encounter: Payer: Self-pay | Admitting: Family Medicine

## 2021-08-27 MED ORDER — NABUMETONE 500 MG PO TABS: 1000.0000 mg | ORAL_TABLET | Freq: Two times a day (BID) | ORAL | 1 refills | Status: DC

## 2021-08-27 MED ORDER — OMEPRAZOLE 40 MG PO CPDR: DELAYED_RELEASE_CAPSULE | ORAL | 3 refills | Status: DC

## 2021-08-30 ENCOUNTER — Ambulatory Visit (INDEPENDENT_AMBULATORY_CARE_PROVIDER_SITE_OTHER): Payer: Medicare HMO | Admitting: Nurse Practitioner

## 2021-08-30 ENCOUNTER — Other Ambulatory Visit: Payer: Self-pay | Admitting: *Deleted

## 2021-08-30 ENCOUNTER — Encounter: Payer: Self-pay | Admitting: Nurse Practitioner

## 2021-08-30 ENCOUNTER — Telehealth: Payer: Self-pay | Admitting: Family Medicine

## 2021-08-30 ENCOUNTER — Ambulatory Visit: Admission: RE | Admit: 2021-08-30 | Payer: Medicare HMO | Source: Ambulatory Visit

## 2021-08-30 VITALS — BP 188/88 | HR 92 | Temp 99.0°F | Ht 65.0 in | Wt 327.0 lb

## 2021-08-30 DIAGNOSIS — R051 Acute cough: Secondary | ICD-10-CM

## 2021-08-30 DIAGNOSIS — J0110 Acute frontal sinusitis, unspecified: Secondary | ICD-10-CM | POA: Diagnosis not present

## 2021-08-30 DIAGNOSIS — H9202 Otalgia, left ear: Secondary | ICD-10-CM

## 2021-08-30 DIAGNOSIS — F5101 Primary insomnia: Secondary | ICD-10-CM

## 2021-08-30 MED ORDER — DOXYCYCLINE HYCLATE 100 MG PO TABS: 100.0000 mg | ORAL_TABLET | Freq: Two times a day (BID) | ORAL | 0 refills | Status: DC

## 2021-08-30 MED ORDER — TRAZODONE HCL 300 MG PO TABS: ORAL_TABLET | ORAL | 3 refills | Status: DC

## 2021-08-30 MED ORDER — DM-GUAIFENESIN ER 30-600 MG PO TB12: 1.0000 | ORAL_TABLET | Freq: Two times a day (BID) | ORAL | 0 refills | Status: DC

## 2021-08-30 NOTE — Telephone Encounter (Signed)
done

## 2021-08-30 NOTE — Patient Instructions (Signed)
Cough, Adult Coughing is a reflex that clears your throat and your airways (respiratory system). Coughing helps to heal and protect your lungs. It is normal to cough occasionally, but a cough that happens with other symptoms or lasts a long time may be a sign of a condition that needs treatment. An acute cough may only last 2-3 weeks, while a chronic cough may last 8 or more weeks. Coughing is commonly caused by: Infection of the respiratory systemby viruses or bacteria. Breathing in substances that irritate your lungs. Allergies. Asthma. Mucus that runs down the back of your throat (postnasal drip). Smoking. Acid backing up from the stomach into the esophagus (gastroesophageal reflux). Certain medicines. Chronic lung problems. Other medical conditions such as heart failure or a blood clot in the lung (pulmonary embolism). Follow these instructions at home: Medicines Take over-the-counter and prescription medicines only as told by your health care provider. Talk with your health care provider before you take a cough suppressant medicine. Lifestyle  Avoid cigarette smoke. Do not use any products that contain nicotine or tobacco, such as cigarettes, e-cigarettes, and chewing tobacco. If you need help quitting, ask your health care provider. Drink enough fluid to keep your urine pale yellow. Avoid caffeine. Do not drink alcohol if your health care provider tells you not to drink. General instructions  Pay close attention to changes in your cough. Tell your health care provider about them. Always cover your mouth when you cough. Avoid things that make you cough, such as perfume, candles, cleaning products, or campfire or tobacco smoke. If the air is dry, use a cool mist vaporizer or humidifier in your bedroom or your home to help loosen secretions. If your cough is worse at night, try to sleep in a semi-upright position. Rest as needed. Keep all follow-up visits as told by your health care  provider. This is important. Contact a health care provider if you: Have new symptoms. Cough up pus. Have a cough that does not get better after 2-3 weeks or gets worse. Cannot control your cough with cough suppressant medicines and you are losing sleep. Have pain that gets worse or pain that is not helped with medicine. Have a fever. Have unexplained weight loss. Have night sweats. Get help right away if: You cough up blood. You have difficulty breathing. Your heartbeat is very fast. These symptoms may represent a serious problem that is an emergency. Do not wait to see if the symptoms will go away. Get medical help right away. Call your local emergency services (911 in the [place] Do not drive yourself to the hospital. Summary Coughing is a reflex that clears your throat and your airways. It is normal to cough occasionally, but a cough that happens with other symptoms or lasts a long time may be a sign of a condition that needs treatment. Take over-the-counter and prescription medicines only as told by your health care provider. Always cover your mouth when you cough. Contact a health care provider if you have new symptoms or a cough that does not get better after 2-3 weeks or gets worse. This information is not intended to replace advice given to you by your health care provider. Make sure you discuss any questions you have with your health care provider. Document Revised: 07/30/2018 Document Reviewed: 07/30/2018 Elsevier Patient Education  2022 [employer]. Sinusitis, Adult Sinusitis is inflammation of your sinuses. Sinuses are hollow spaces in the bones around your face. Your sinuses are located: Around your eyes. In the middle  of your forehead. Behind your nose. In your cheekbones. Mucus normally drains out of your sinuses. When your nasal tissues become inflamed or swollen, mucus can become trapped or blocked. This allows bacteria, viruses, and fungi to grow, which leads to  infection. Most infections of the sinuses are caused by a virus. Sinusitis can develop quickly. It can last for up to 4 weeks (acute) or for more than 12 weeks (chronic). Sinusitis often develops after a cold. What are the causes? This condition is caused by anything that creates swelling in the sinuses or stops mucus from draining. This includes: Allergies. Asthma. Infection from bacteria or viruses. Deformities or blockages in your nose or sinuses. Abnormal growths in the nose (nasal polyps). Pollutants, such as chemicals or irritants in the air. Infection from fungi (rare). What increases the risk? You are more likely to develop this condition if you: Have a weak body defense system (immune system). Do a lot of swimming or diving. Overuse nasal sprays. Smoke. What are the signs or symptoms? The main symptoms of this condition are pain and a feeling of pressure around the affected sinuses. Other symptoms include: Stuffy nose or congestion. Thick drainage from your nose. Swelling and warmth over the affected sinuses. Headache. Upper toothache. A cough that may get worse at night. Extra mucus that collects in the throat or the back of the nose (postnasal drip). Decreased sense of smell and taste. Fatigue. A fever. Sore throat. Bad breath. How is this diagnosed? This condition is diagnosed based on: Your symptoms. Your medical history. A physical exam. Tests to find out if your condition is acute or chronic. This may include: Checking your nose for nasal polyps. Viewing your sinuses using a device that has a light (endoscope). Testing for allergies or bacteria. Imaging tests, such as an MRI or CT scan. In rare cases, a bone biopsy may be done to rule out more serious types of fungal sinus disease. How is this treated? Treatment for sinusitis depends on the cause and whether your condition is chronic or acute. If caused by a virus, your symptoms should go away on their own  within 10 days. You may be given medicines to relieve symptoms. They include: Medicines that shrink swollen nasal passages (topical intranasal decongestants). Medicines that treat allergies (antihistamines). A spray that eases inflammation of the nostrils (topical intranasal corticosteroids). Rinses that help get rid of thick mucus in your nose (nasal saline washes). If caused by bacteria, your health care provider may recommend waiting to see if your symptoms improve. Most bacterial infections will get better without antibiotic medicine. You may be given antibiotics if you have: A severe infection. A weak immune system. If caused by narrow nasal passages or nasal polyps, you may need to have surgery. Follow these instructions at home: Medicines Take, use, or apply over-the-counter and prescription medicines only as told by your health care provider. These may include nasal sprays. If you were prescribed an antibiotic medicine, take it as told by your health care provider. Do not stop taking the antibiotic even if you start to feel better. Hydrate and humidify  Drink enough fluid to keep your urine pale yellow. Staying hydrated will help to thin your mucus. Use a cool mist humidifier to keep the humidity level in your home above 50%. Inhale steam for 10-15 minutes, 3-4 times a day, or as told by your health care provider. You can do this in the bathroom while a hot shower is running. Limit your exposure to  cool or dry air. Rest Rest as much as possible. Sleep with your head raised (elevated). Make sure you get enough sleep each night. General instructions  Apply a warm, moist washcloth to your face 3-4 times a day or as told by your health care provider. This will help with discomfort. Wash your hands often with soap and water to reduce your exposure to germs. If soap and water are not available, use hand sanitizer. Do not smoke. Avoid being around people who are smoking (secondhand  smoke). Keep all follow-up visits as told by your health care provider. This is important. Contact a health care provider if: You have a fever. Your symptoms get worse. Your symptoms do not improve within 10 days. Get help right away if: You have a severe headache. You have persistent vomiting. You have severe pain or swelling around your face or eyes. You have vision problems. You develop confusion. Your neck is stiff. You have trouble breathing. Summary Sinusitis is soreness and inflammation of your sinuses. Sinuses are hollow spaces in the bones around your face. This condition is caused by nasal tissues that become inflamed or swollen. The swelling traps or blocks the flow of mucus. This allows bacteria, viruses, and fungi to grow, which leads to infection. If you were prescribed an antibiotic medicine, take it as told by your health care provider. Do not stop taking the antibiotic even if you start to feel better. Keep all follow-up visits as told by your health care provider. This is important. This information is not intended to replace advice given to you by your health care provider. Make sure you discuss any questions you have with your health care provider. Document Revised: 12/11/2017 Document Reviewed: 12/11/2017 Elsevier Patient Education  2022 [employer].

## 2021-08-30 NOTE — Telephone Encounter (Signed)
°  Prescription Request  08/30/2021  Is this a "Controlled Substance" medicine? no  Have you seen your PCP in the last 2 weeks? yes  If YES, route message to pool  -  If NO, patient needs to be scheduled for appointment.  What is the name of the medication or equipment? trazodone (DESYREL) 300 MG tablet  Have you contacted your pharmacy to request a refill? yes   Which [facility] [place]   Needs it called in for 100 mg for 3 times a day - per insurance    Patient notified that their request is being sent to the clinical staff for review and that they should receive a response within 2 business days.

## 2021-08-30 NOTE — Progress Notes (Signed)
Acute Office Visit  Subjective:    Patient ID: [name], female    DOB: 10-27-1960, 61 y.o.   MRN: [id number]  Chief Complaint  Patient presents with   Cough    Congestion (head and chest) Headache Ears plugged     Cough Episode onset: in the past 4 days. The problem has been unchanged. The problem occurs constantly. The cough is Non-productive. Associated symptoms include ear pain, nasal congestion and shortness of breath. Pertinent negatives include no chills, fever, rash or sore throat. Nothing aggravates the symptoms. Risk factors for lung disease include animal exposure.  Otalgia  There is pain in the left ear. This is a new problem. The problem occurs constantly. The problem has been unchanged. There has been no fever. Associated symptoms include coughing. Pertinent negatives include no rash or sore throat. She has tried nothing for the symptoms.  Sinusitis This is a new problem. Episode onset: in the past 4 days. The problem has been gradually worsening since onset. There has been no fever. The pain is moderate. Associated symptoms include congestion, coughing, ear pain, shortness of breath and sinus pressure. Pertinent negatives include no chills or sore throat. Past treatments include nothing.    Past Medical History:  Diagnosis Date   Anemia    hx of with pregnancy    Anxiety    ARF (acute renal failure) ([place]) 07/16/2019   Arthritis    Asthma    Colon polyps    Depression    GERD (gastroesophageal reflux disease)    Headache    occasional headaches    High cholesterol    Hypertension    Lithium toxicity 06/2019   has since resolved after discontinuing    PONV (postoperative nausea and vomiting)    Shortness of breath dyspnea    due to weight gain    Sleep apnea    has not use CPAP machine in 2 yrs/ DOES NOT KNOW IF SHE NEEDS TO USE C - PAP   Syncope and collapse    due to severe knee pain     Past Surgical History:  Procedure Laterality Date   BACK  SURGERY  2012   lumb fusion   CARPAL TUNNEL RELEASE  2007   both hands   COLONOSCOPY     ENDOMETRIAL ABLATION W/ NOVASURE     EXCISION METACARPAL MASS Right 03/10/2014   Procedure: EXCISION MASS RIGHT HAND FIRST WEB SPACE DORSAL PALMAR INCISION ;  Surgeon: Daryll Brod, MD;  Location: [facility];  Service: Orthopedics;  Laterality: Right;   EXCISIONAL TOTAL KNEE ARTHROPLASTY WITH ANTIBIOTIC SPACERS Left 09/19/2019   Procedure: EXCISIONAL  LEFT TOTAL KNEE ARTHROPLASTY WITH TOTAL KNEE REVISION;  Surgeon: Paralee Cancel, MD;  Location: [facility];  Service: Orthopedics;  Laterality: Left;  2 hrs   IRRIGATION AND DEBRIDEMENT KNEE Left 05/23/2014   Procedure: IRRIGATION AND DEBRIDEMENT KNEE;  Surgeon: Mauri Pole, MD;  Location: [facility];  Service: Orthopedics;  Laterality: Left;  With POLYETHYLENE EXCHANGE   KNEE ARTHROSCOPY     let knee 03/2013    KNEE ARTHROSCOPY WITH MEDIAL MENISECTOMY Right 10/22/2013   Procedure: RIGHT KNEE ARTHROSCOPY WITH MEDIAL MENISECTOMY, abrasion chrondrplasty Rickard Patience OUT/CORRECT CURETTEMENT/BONE GRAFT/PROXIMAL TIBIA ;  Surgeon: Tobi Bastos, MD;  Location: [facility];  Service: Orthopedics;  Laterality: Right;   TOTAL KNEE ARTHROPLASTY Left 04/16/2014   Procedure: LEFT TOTAL KNEE ARTHROPLASTY;  Surgeon: Tobi Bastos, MD;  Location: [facility];  Service: Orthopedics;  Laterality:  Left;   TOTAL KNEE ARTHROPLASTY Right 12/23/2014   Procedure: TOTAL KNEE ARTHROPLASTY;  Surgeon: Latanya Maudlin, MD;  Location: [facility];  Service: Orthopedics;  Laterality: Right;   trigger finger release surgeyr      bilateral    TUBAL LIGATION      Family History  Problem Relation Age of Onset   Alcohol abuse Mother    Arthritis Mother    Heart disease Mother    Hyperlipidemia Father    Hypertension Father    Heart disease Father 2       CABG   Gout Father    Hypertension Sister    Cancer Brother        colon, lung, liver   Cancer Maternal Grandmother        breast   Cancer  Daughter        NEUROBLASTOMA    Social History   Socioeconomic History   Marital status: Married    Spouse name: Merry Proud   Number of children: 2   Years of education: 12   Highest education level: Some college, no degree  Occupational History    Comment: Disabled  Tobacco Use   Smoking status: Never   Smokeless tobacco: Never  Vaping Use   Vaping Use: Never used  Substance and Sexual Activity   Alcohol use: No   Drug use: No   Sexual activity: Not on file  Other Topics Concern   Not on file  Social History Narrative   Lives with her husband who helps with grocery shopping and other transportation needs.     Has two children, Eduard Clos and Santiago Glad and grandchildren - they all live nearby.   There are two outside dogs and several  Horses.    Social Determinants of Health   Financial Resource Strain: Low Risk    Difficulty of Paying Living Expenses: Not hard at all  Food Insecurity: No Food Insecurity   Worried About Charity fundraiser in the Last Year: Never true   [place] in the Last Year: Never true  Transportation Needs: No Transportation Needs   Lack of Transportation (Medical): No   Lack of Transportation (Non-Medical): No  Physical Activity: Inactive   Days of Exercise per Week: 0 days   Minutes of Exercise per Session: 0 min  Stress: Stress Concern Present   Feeling of Stress : To some extent  Social Connections: Moderately Integrated   Frequency of Communication with Friends and Family: More than three times a week   Frequency of Social Gatherings with Friends and Family: More than three times a week   Attends Religious Services: Never   Marine scientist or Organizations: Yes   Attends Music therapist: 1 to 4 times per year   Marital Status: Married  Human resources officer Violence: Not At Risk   Fear of Current or Ex-Partner: No   Emotionally Abused: No   Physically Abused: No   Sexually Abused: No    Outpatient Medications Prior to  Visit  Medication Sig Dispense Refill   acetaminophen (TYLENOL) 500 MG tablet Take 2 tablets (1,000 mg total) by mouth every 8 (eight) hours. 30 tablet 0   albuterol (VENTOLIN HFA) 108 (90 Base) MCG/ACT inhaler TAKE 2 PUFFS BY MOUTH EVERY 6 HOURS AS NEEDED FOR WHEEZE OR SHORTNESS OF BREATH 18 each 2   benzonatate (TESSALON) 200 MG capsule TAKE 1 CAPSULE BY MOUTH THREE TIMES A DAY AS NEEDED 30 capsule 3   famotidine (  PEPCID) 20 MG tablet Take 1 tablet (20 mg total) by mouth 2 (two) times daily. 60 tablet 3   fluocinonide cream (LIDEX) 1.49 % Apply 1 application topically 2 (two) times daily.     fluticasone (FLONASE) 50 MCG/ACT nasal spray Place 1 spray into both nostrils daily. 16 g 2   levothyroxine (SYNTHROID) 100 MCG tablet TAKE 1 TABLET BY MOUTH EVERY DAY BEFORE BREAKFAST 90 tablet 3   losartan (COZAAR) 50 MG tablet Take 1 tablet (50 mg total) by mouth daily. 90 tablet 3   methocarbamol (ROBAXIN) 500 MG tablet Take 500 mg by mouth every 6 (six) hours as needed for muscle spasms.     montelukast (SINGULAIR) 10 MG tablet TAKE 1 TABLET BY MOUTH EVERYDAY AT BEDTIME 90 tablet 3   nabumetone (RELAFEN) 500 MG tablet Take 2 tablets (1,000 mg total) by mouth 2 (two) times daily. For muscle and joint pain 360 tablet 1   omeprazole (PRILOSEC) 40 MG capsule TAKE 1 CAPSULE BY MOUTH EVERY DAY 90 capsule 3   pravastatin (PRAVACHOL) 40 MG tablet TAKE 1 TABLET BY MOUTH EVERYDAY AT BEDTIME 90 tablet 3   pregabalin (LYRICA) 75 MG capsule Take 75 mg by mouth 2 (two) times daily.     promethazine (PHENERGAN) 25 MG tablet TAKE 1 TABLET BY MOUTH EVERY 6 HOURS AS NEEDED FOR NAUSEA AND VOMITING 30 tablet 2   sertraline (ZOLOFT) 50 MG tablet Take 1 tablet (50 mg total) by mouth at bedtime. 90 tablet 3   trazodone (DESYREL) 300 MG tablet TAKE 1 TABLET BY MOUTH EVERYDAY AT BEDTIME 90 tablet 3   No facility-administered medications prior to visit.    Allergies  Allergen Reactions   Adhesive [Tape] Other (See  Comments)    Peeled off skin   Ceclor [Cefaclor] Other (See Comments)    Blisters on hands   Chocolate Other (See Comments)    migraines   Venlafaxine Other (See Comments)    "refuses to take - makes me feel like I am dying"   Lisinopril Cough    Review of Systems  Constitutional: Negative.  Negative for chills and fever.  HENT:  Positive for congestion, ear pain, sinus pressure and sinus pain. Negative for sore throat.   Eyes: Negative.   Respiratory:  Positive for cough and shortness of breath.   Gastrointestinal: Negative.   Musculoskeletal: Negative.   Skin: Negative.  Negative for rash.  All other systems reviewed and are negative.     Objective:    Physical Exam Vitals and nursing note reviewed.  Constitutional:      Appearance: Normal appearance.  HENT:     Right Ear: External ear normal.     Left Ear: External ear normal.     Nose: Congestion present.     Mouth/Throat:     Pharynx: Oropharynx is clear.  Eyes:     Conjunctiva/sclera: Conjunctivae normal.  Cardiovascular:     Pulses: Normal pulses.     Heart sounds: Normal heart sounds.  Pulmonary:     Effort: Pulmonary effort is normal.     Breath sounds: Normal breath sounds.  Abdominal:     General: Bowel sounds are normal.  Skin:    General: Skin is warm.     Findings: No rash.  Neurological:     Mental Status: She is alert and oriented to person, place, and time.  Psychiatric:        Behavior: Behavior normal.    BP (!) 188/88  Pulse 92    Temp 99 F (37.2 C)    Ht _0  (1.651 m)    Wt (!) 327 lb (148.3 kg)    SpO2 92%    BMI 54.42 kg/m  Wt Readings from Last 3 Encounters:  08/30/21 (!) 327 lb (148.3 kg)  08/25/21 (!) 337 lb 6.4 oz (153 kg)  08/18/21 (!) 337 lb (152.9 kg)    Health Maintenance Due  Topic Date Due   PAP SMEAR-Modifier  Never done   MAMMOGRAM  06/19/2014    There are no preventive care reminders to display for this patient.   Lab Results  Component Value Date   TSH  0.603 02/26/2021   Lab Results  Component Value Date   WBC 11.1 (H) 02/26/2021   HGB 12.0 02/26/2021   HCT 38.5 02/26/2021   MCV 84 02/26/2021   PLT 319 02/26/2021   Lab Results  Component Value Date   NA 136 02/26/2021   K 4.5 02/26/2021   CO2 26 02/26/2021   GLUCOSE 170 (H) 02/26/2021   BUN 21 02/26/2021   CREATININE 1.14 (H) 02/26/2021   BILITOT 0.2 02/26/2021   ALKPHOS 122 (H) 02/26/2021   AST 20 02/26/2021   ALT 21 02/26/2021   PROT 7.7 02/26/2021   ALBUMIN 4.8 02/26/2021   CALCIUM 9.6 02/26/2021   ANIONGAP 10 09/20/2019   EGFR 56 (L) 02/26/2021   GFR 77.95 10/16/2014   Lab Results  Component Value Date   CHOL 188 02/26/2021   Lab Results  Component Value Date   HDL 38 (L) 02/26/2021   Lab Results  Component Value Date   LDLCALC 115 (H) 02/26/2021   Lab Results  Component Value Date   TRIG 200 (H) 02/26/2021   Lab Results  Component Value Date   CHOLHDL 4.9 (H) 02/26/2021   Lab Results  Component Value Date   HGBA1C 5.5 08/20/2019       Assessment & Plan:  Take meds as prescribed - Use a cool mist humidifier  -Use saline nose sprays frequently -Force fluids -For fever or aches or pains- take Tylenol or ibuprofen. -Doxycycline 100 mg tablet by mouth for 7 days. -If symptoms do not improve, she may need to be COVID tested to rule this out Follow up with worsening unresolved symptoms  Problem List Items Addressed This Visit       Respiratory   Subacute frontal sinusitis - Primary   Relevant Medications   doxycycline (VIBRA-TABS) 100 MG tablet   dextromethorphan-guaiFENesin (MUCINEX DM) 30-600 MG 12hr tablet   Other Relevant Orders   COVID-19, Flu A+B and RSV   Other Visit Diagnoses     Acute cough       Relevant Medications   doxycycline (VIBRA-TABS) 100 MG tablet   Other Relevant Orders   COVID-19, Flu A+B and RSV   Left ear pain            Meds ordered this encounter  Medications   doxycycline (VIBRA-TABS) 100 MG tablet     Sig: Take 1 tablet (100 mg total) by mouth 2 (two) times daily.    Dispense:  14 tablet    Refill:  0    Order Specific Question:   Supervising Provider    Answer:   Jeneen Rinks   dextromethorphan-guaiFENesin (MUCINEX DM) 30-600 MG 12hr tablet    Sig: Take 1 tablet by mouth 2 (two) times daily.    Dispense:  30 tablet    Refill:  0  Order Specific Question:   Supervising Provider    Answer:   [name] [[id number]]     Ivy Lynn, NP

## 2021-08-31 ENCOUNTER — Telehealth: Payer: Self-pay

## 2021-08-31 LAB — COVID-19, FLU A+B AND RSV
Influenza A, NAA: NOT DETECTED
Influenza B, NAA: NOT DETECTED
RSV, NAA: NOT DETECTED
SARS-CoV-2, NAA: NOT DETECTED

## 2021-08-31 MED ORDER — TRAZODONE HCL 100 MG PO TABS: 300.0000 mg | ORAL_TABLET | Freq: Every day | ORAL | 1 refills | Status: DC

## 2021-08-31 NOTE — Telephone Encounter (Signed)
Prescription sent to pharmacy.

## 2021-08-31 NOTE — Telephone Encounter (Signed)
Dr. Darlyn Read prescribed Trazodone 300 mg at bedtime at her visit last week.  Insurance will not cover the 300 mg tablet but will cover 100 mg take 3 at bedtime.  Please re-send this way to [facility].

## 2021-09-07 ENCOUNTER — Other Ambulatory Visit: Payer: Self-pay | Admitting: Family Medicine

## 2021-09-07 DIAGNOSIS — R110 Nausea: Secondary | ICD-10-CM

## 2021-09-15 ENCOUNTER — Ambulatory Visit
Admission: RE | Admit: 2021-09-15 | Discharge: 2021-09-15 | Disposition: A | Discharge status: Home / Self Care | Payer: Medicare HMO | Source: Ambulatory Visit | Attending: Family Medicine | Admitting: Family Medicine

## 2021-09-15 ENCOUNTER — Other Ambulatory Visit: Payer: Self-pay

## 2021-09-15 DIAGNOSIS — Z1231 Encounter for screening mammogram for malignant neoplasm of breast: Secondary | ICD-10-CM

## 2021-09-16 ENCOUNTER — Ambulatory Visit: Payer: Medicare HMO | Admitting: *Deleted

## 2021-09-16 ENCOUNTER — Telehealth: Payer: Self-pay | Admitting: *Deleted

## 2021-09-16 VITALS — BP 158/85 | HR 93

## 2021-09-16 DIAGNOSIS — I10 Essential (primary) hypertension: Secondary | ICD-10-CM

## 2021-09-16 NOTE — Telephone Encounter (Signed)
Pt calling today c/o headache for over a week and her BP readings have been elevated. Her reading last night was 174/99 with a HR of 77 and this morning BP was 169/97 with HR of 77 and pt took BP while on the phone with me and had a reading of 128/112 so we repeated the reading and it was 180/98 with a HR of 78 but pt says her BP cuff is old. Pt denies chest pain, diaphoresis, SOB, numbness/tingling, weakness and was advised to bring her cuff into the office at 3:30 today with nurse to have a BP check to compare. Advised pt if she starts to experience any of the above symptoms to go to ED for evaluation. Pt voiced understanding.

## 2021-09-16 NOTE — Progress Notes (Signed)
Pt came to get b/p recheck with our machine compared reading with pt's machine  Our machine:171/91 w/hr 100  rechck : 158/85 whr 93 after .  Pt's machine: 185/93 w/hr 100  Pt's last five home readings were;  180/98-71 174/96-80 174/99-77 183/100-90 169/97-77   Will let provider know.   Advise pt to keep track of her b/p daily checking it twice/day on the same arm. Would you like pt to follow up with you or nurse recheck bp ? Or medication change as pt is taking her Losartan 50mg ? Please advise.

## 2021-09-24 ENCOUNTER — Other Ambulatory Visit: Payer: Self-pay | Admitting: Family

## 2021-09-24 NOTE — Telephone Encounter (Signed)
Last office visit 08/25/21 ?Last refill 08/31/21, #90, 1 refill ?

## 2021-10-11 NOTE — Progress Notes (Deleted)
? ?Synopsis: Referred for cough, dyspnea, possible history of asthma by [name], MD ? ?Subjective:  ? ?PATIENT ID: [name] GENDER: female DOB: 1961/07/15, MRN: [id number] ? ?No chief complaint on file. ? ?[name] with history of GERD on ppi nightly, OSA (not supported by 2013 sleep study but she weighed ~270 lb at the time), asthma dx as a child never has had PFTs, never smoker, who is seen for cough, dyspnea. ? ?Seen by PCP 8/5. Maintained on singulair, trelegy and started on prednisone taper, levaquin. She doesn't note much difference with trelegy as opposed to breo.  ? ?She says she has DOE that's been on and off, but lately it has been progressive over last 3 months. DOE to 50 ft currently. She has some accompanying chest pain that is worse with coughing. Pleuritic after a bad coughing spell. Had bronchitis about 3 weeks ago given above tx, she feels tessalon perles were probably more helpful than prednisone. She never took the levaquin. She doesn't note any worsening orthopnea - she always feels smothered when she lies on her back. Some worsening swelling in R>L foot.  ? ?Smoked very little when she was in middle school. She works for [employer] in [place] division. 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 She has lived in [place] for a period. She has a cat. No pet bird or hot tub. ? ?No first degree relatives with lung disease.  ? ?Interval HPI: ?Last seen by me 07/2021 at which point started on famotidine on days she's not taking her NSAID-famotidine combo, flonase, tessalon perles.  ? ?Seen by family medicine 2/6 for sinusitis, acute cough and given doxycycline and mucinex ? ?Otherwise pertinent review of systems is negative. ? ?Past Medical History:  ?Diagnosis Date  ? Anemia   ? hx of with pregnancy   ? Anxiety   ? ARF (acute renal failure) (HCC) 07/16/2019  ? Arthritis   ? Asthma   ? Colon polyps   ? Depression   ? GERD (gastroesophageal reflux  disease)   ? Headache   ? occasional headaches   ? High cholesterol   ? Hypertension   ? Lithium toxicity 06/2019  ? has since resolved after discontinuing   ? PONV (postoperative nausea and vomiting)   ? Shortness of breath dyspnea   ? due to weight gain   ? Sleep apnea   ? has not use CPAP machine in 2 yrs/ DOES NOT KNOW IF SHE NEEDS TO USE C - PAP  ? Syncope and collapse   ? due to severe knee pain   ?  ? ?Family History  ?Problem Relation Age of Onset  ? Alcohol abuse Mother   ? Arthritis Mother   ? Heart disease Mother   ? Hyperlipidemia Father   ? Hypertension Father   ? Heart disease Father 97  ?     CABG  ? Gout Father   ? Hypertension Sister   ? Cancer Daughter   ?     NEUROBLASTOMA  ? Breast cancer Maternal Grandmother   ? Cancer Maternal Grandmother   ?     breast  ? Cancer Brother   ?     colon, lung, liver  ?  ? ?Past Surgical History:  ?Procedure Laterality Date  ? BACK SURGERY  2012  ? lumb fusion  ? CARPAL TUNNEL RELEASE  2007  ? both hands  ? COLONOSCOPY    ? ENDOMETRIAL ABLATION W/  NOVASURE    ? EXCISION METACARPAL MASS Right 03/10/2014  ? Procedure: EXCISION MASS RIGHT HAND FIRST WEB SPACE DORSAL PALMAR INCISION ;  Surgeon: Cindee SaltGary Kuzma, MD;  Location: [facility];  Service: Orthopedics;  Laterality: Right;  ? EXCISIONAL TOTAL KNEE ARTHROPLASTY WITH ANTIBIOTIC SPACERS Left 09/19/2019  ? Procedure: EXCISIONAL  LEFT TOTAL KNEE ARTHROPLASTY WITH TOTAL KNEE REVISION;  Surgeon: Durene Romanslin, Matthew, MD;  Location: [facility];  Service: Orthopedics;  Laterality: Left;  2 hrs  ? IRRIGATION AND DEBRIDEMENT KNEE Left 05/23/2014  ? Procedure: IRRIGATION AND DEBRIDEMENT KNEE;  Surgeon: Shelda PalMatthew D Olin, MD;  Location: [facility];  Service: Orthopedics;  Laterality: Left;  With POLYETHYLENE EXCHANGE  ? KNEE ARTHROSCOPY    ? let knee 03/2013   ? KNEE ARTHROSCOPY WITH MEDIAL MENISECTOMY Right 10/22/2013  ? Procedure: RIGHT KNEE ARTHROSCOPY WITH MEDIAL MENISECTOMY, abrasion chrondrplasty Brigid Re/CLEAN OUT/CORRECT  CURETTEMENT/BONE GRAFT/PROXIMAL TIBIA ;  Surgeon: Jacki Conesonald A Gioffre, MD;  Location: [facility];  Service: Orthopedics;  Laterality: Right;  ? TOTAL KNEE ARTHROPLASTY Left 04/16/2014  ? Procedure: LEFT TOTAL KNEE ARTHROPLASTY;  Surgeon: Jacki Conesonald A Gioffre, MD;  Location: [facility];  Service: Orthopedics;  Laterality: Left;  ? TOTAL KNEE ARTHROPLASTY Right 12/23/2014  ? Procedure: TOTAL KNEE ARTHROPLASTY;  Surgeon: Ranee Gosselinonald Gioffre, MD;  Location: [facility];  Service: Orthopedics;  Laterality: Right;  ? trigger finger release surgeyr     ? bilateral   ? TUBAL LIGATION    ? ? ?Social History  ? ?Socioeconomic History  ? Marital status: Married  ?  Spouse name: Trey PaulaJeff  ? Number of children: 2  ? Years of education: 6912  ? Highest education level: Some college, no degree  ?Occupational History  ?  Comment: Disabled  ?Tobacco Use  ? Smoking status: Never  ? Smokeless tobacco: Never  ?Vaping Use  ? Vaping Use: Never used  ?Substance and Sexual Activity  ? Alcohol use: No  ? Drug use: No  ? Sexual activity: Not on file  ?Other Topics Concern  ? Not on file  ?Social History Narrative  ? Lives with her husband who helps with grocery shopping and other transportation needs.    ? Has two children, Billey GoslingCharlie and Clydie BraunKaren and grandchildren - they all live nearby.  ? There are two outside dogs and several  Horses.   ? ?Social Determinants of Health  ? ?Financial Resource Strain: Low Risk   ? Difficulty of Paying Living Expenses: Not hard at all  ?Food Insecurity: No Food Insecurity  ? Worried About Programme researcher, broadcasting/film/videounning Out of Food in the Last Year: Never true  ? Ran Out of Food in the Last Year: Never true  ?Transportation Needs: No Transportation Needs  ? Lack of Transportation (Medical): No  ? Lack of Transportation (Non-Medical): No  ?Physical Activity: Inactive  ? Days of Exercise per Week: 0 days  ? Minutes of Exercise per Session: 0 min  ?Stress: Stress Concern Present  ? Feeling of Stress : To some extent  ?Social Connections: Moderately Integrated  ? Frequency  of Communication with Friends and Family: More than three times a week  ? Frequency of Social Gatherings with Friends and Family: More than three times a week  ? Attends Religious Services: Never  ? Active Member of Clubs or Organizations: Yes  ? Attends BankerClub or Organization Meetings: 1 to 4 times per year  ? Marital Status: Married  ?Intimate Partner Violence: Not At Risk  ? Fear of Current or Ex-Partner: No  ?  Emotionally Abused: No  ? Physically Abused: No  ? Sexually Abused: No  ?  ? ?Allergies  ?Allergen Reactions  ? Adhesive [Tape] Other (See Comments)  ?  Peeled off skin  ? Ceclor [Cefaclor] Other (See Comments)  ?  Blisters on hands  ? Chocolate Other (See Comments)  ?  migraines  ? Venlafaxine Other (See Comments)  ?  "refuses to take - makes me feel like I am dying"  ? Lisinopril Cough  ?  ? ?Outpatient Medications Prior to Visit  ?Medication Sig Dispense Refill  ? acetaminophen (TYLENOL) 500 MG tablet Take 2 tablets (1,000 mg total) by mouth every 8 (eight) hours. 30 tablet 0  ? albuterol (VENTOLIN HFA) 108 (90 Base) MCG/ACT inhaler TAKE 2 PUFFS BY MOUTH EVERY 6 HOURS AS NEEDED FOR WHEEZE OR SHORTNESS OF BREATH 18 each 2  ? benzonatate (TESSALON) 200 MG capsule TAKE 1 CAPSULE BY MOUTH THREE TIMES A DAY AS NEEDED 30 capsule 3  ? dextromethorphan-guaiFENesin (MUCINEX DM) 30-600 MG 12hr tablet Take 1 tablet by mouth 2 (two) times daily. 30 tablet 0  ? doxycycline (VIBRA-TABS) 100 MG tablet Take 1 tablet (100 mg total) by mouth 2 (two) times daily. 14 tablet 0  ? famotidine (PEPCID) 20 MG tablet Take 1 tablet (20 mg total) by mouth 2 (two) times daily. 60 tablet 3  ? fluocinonide cream (LIDEX) 0.05 % Apply 1 application topically 2 (two) times daily.    ? fluticasone (FLONASE) 50 MCG/ACT nasal spray Place 1 spray into both nostrils daily. 16 g 2  ? levothyroxine (SYNTHROID) 100 MCG tablet TAKE 1 TABLET BY MOUTH EVERY DAY BEFORE BREAKFAST 90 tablet 3  ? losartan (COZAAR) 50 MG tablet Take 1 tablet (50 mg  total) by mouth daily. 90 tablet 3  ? methocarbamol (ROBAXIN) 500 MG tablet Take 500 mg by mouth every 6 (six) hours as needed for muscle spasms.    ? montelukast (SINGULAIR) 10 MG tablet TAKE 1 TABLET BY MOUTH EVERYDAY AT BE

## 2021-10-12 ENCOUNTER — Ambulatory Visit: Payer: Medicare HMO | Admitting: Student

## 2021-10-19 ENCOUNTER — Other Ambulatory Visit: Payer: Self-pay | Admitting: Family

## 2021-11-26 ENCOUNTER — Telehealth: Payer: Self-pay | Admitting: Family Medicine

## 2021-11-26 NOTE — Telephone Encounter (Signed)
I do not see where Dr Darlyn Read has provided either of these meds to this patient.  Can wait until his return on Monday.  Luckily, both of these meds are available OTC, so would consider getting OTC. Ibuprofen and nabumetone are in the same class. Would NOT use both as they will shut her kidneys down. ?

## 2021-11-29 ENCOUNTER — Other Ambulatory Visit: Payer: Self-pay | Admitting: Family Medicine

## 2021-11-29 MED ORDER — FAMOTIDINE 20 MG PO TABS: 20.0000 mg | ORAL_TABLET | Freq: Two times a day (BID) | ORAL | 3 refills | Status: DC

## 2021-11-29 NOTE — Telephone Encounter (Signed)
CALLED PATIENT, NO ANSWER °

## 2021-11-29 NOTE — Telephone Encounter (Signed)
I sent in the famotidine.  ? ?I prescribed nabumetone for her. She should not take ibuprofen. These two are not compatible. ?

## 2021-11-30 NOTE — Telephone Encounter (Signed)
PATIENT AWARE

## 2021-12-16 ENCOUNTER — Telehealth: Payer: Self-pay | Admitting: Family Medicine

## 2021-12-16 NOTE — Telephone Encounter (Signed)
Davina called from [facility] to see if PCP can separate pts Ibuprofen and Famotidine Rx. Says the combination drug is costing pt over $100 and will be much cheaper for pt if they are separated.

## 2021-12-16 NOTE — Telephone Encounter (Signed)
She should be taking nabumetone instead of the ibuprofen and famotidine

## 2021-12-16 NOTE — Telephone Encounter (Signed)
PATIENT AWARE

## 2021-12-19 ENCOUNTER — Other Ambulatory Visit: Payer: Self-pay | Admitting: Family Medicine

## 2022-01-03 ENCOUNTER — Other Ambulatory Visit: Payer: Self-pay | Admitting: Family Medicine

## 2022-01-03 DIAGNOSIS — R110 Nausea: Secondary | ICD-10-CM

## 2022-01-05 ENCOUNTER — Other Ambulatory Visit: Payer: Self-pay | Admitting: Family Medicine

## 2022-01-15 ENCOUNTER — Other Ambulatory Visit: Payer: Self-pay | Admitting: Family Medicine

## 2022-01-15 DIAGNOSIS — F331 Major depressive disorder, recurrent, moderate: Secondary | ICD-10-CM

## 2022-01-18 ENCOUNTER — Encounter: Payer: Self-pay | Admitting: Family Medicine

## 2022-01-18 DIAGNOSIS — L718 Other rosacea: Secondary | ICD-10-CM | POA: Diagnosis not present

## 2022-01-18 DIAGNOSIS — L309 Dermatitis, unspecified: Secondary | ICD-10-CM | POA: Diagnosis not present

## 2022-01-24 DIAGNOSIS — M79672 Pain in left foot: Secondary | ICD-10-CM | POA: Diagnosis not present

## 2022-01-27 ENCOUNTER — Encounter: Payer: Self-pay | Admitting: Family Medicine

## 2022-01-27 ENCOUNTER — Ambulatory Visit (INDEPENDENT_AMBULATORY_CARE_PROVIDER_SITE_OTHER): Payer: Medicare HMO | Admitting: Family Medicine

## 2022-01-27 VITALS — BP 119/74 | HR 72 | Temp 98.0°F | Ht 65.0 in | Wt 328.2 lb

## 2022-01-27 DIAGNOSIS — E782 Mixed hyperlipidemia: Secondary | ICD-10-CM | POA: Diagnosis not present

## 2022-01-27 DIAGNOSIS — E039 Hypothyroidism, unspecified: Secondary | ICD-10-CM | POA: Diagnosis not present

## 2022-01-27 DIAGNOSIS — I10 Essential (primary) hypertension: Secondary | ICD-10-CM | POA: Diagnosis not present

## 2022-01-27 DIAGNOSIS — R0683 Snoring: Secondary | ICD-10-CM | POA: Diagnosis not present

## 2022-01-27 DIAGNOSIS — Z23 Encounter for immunization: Secondary | ICD-10-CM | POA: Diagnosis not present

## 2022-01-27 MED ORDER — LOSARTAN POTASSIUM 50 MG PO TABS: 50.0000 mg | ORAL_TABLET | Freq: Every day | ORAL | 3 refills | Status: DC

## 2022-01-27 MED ORDER — LEVOTHYROXINE SODIUM 100 MCG PO TABS: ORAL_TABLET | ORAL | 3 refills | Status: DC

## 2022-01-27 NOTE — Progress Notes (Signed)
Subjective:  Patient ID: [name], female    DOB: 06-22-1961  Age: 61 y.o. MRN: [id number]  CC: Medical Management of Chronic Issues   HPI JIMIA GENTLES presents for  follow-up on  thyroid. The patient has a history of hypothyroidism for many years. It has been stable recently. Pt. denies any change in  voice, loss of hair, heat or cold intolerance. Energy level has been adequate to good. Patient denies constipation and diarrhea. No myxedema. Medication is as noted below. Verified that pt is taking it daily on an empty stomach. Well tolerated.  Lives with a "Negative Izora Gala" never says anything positive, causes her to lose interest in the relationship.  Patient in for follow-up of GERD. Currently asymptomatic taking  PPI daily. There is no chest pain or heartburn. No hematemesis and no melena. No dysphagia or choking. Onset is remote. Progression is stable. Complicating factors, none.      01/27/2022    3:19 PM 01/27/2022    3:02 PM 08/30/2021   12:38 PM  Depression screen PHQ 2/9  Decreased Interest 3 0 2  Down, Depressed, Hopeless 1 0 2  PHQ - 2 Score 4 0 4  Altered sleeping 0  2  Tired, decreased energy 1  2  Change in appetite 0  2  Feeling bad or failure about yourself  0  3  Trouble concentrating 0  1  Moving slowly or fidgety/restless 0  0  Suicidal thoughts 0  0  PHQ-9 Score 5  14  Difficult doing work/chores Not difficult at all  Not difficult at all    History Conley has a past medical history of Anemia, Anxiety, ARF (acute renal failure) ([place]) (07/16/2019), Arthritis, Asthma, Colon polyps, Depression, GERD (gastroesophageal reflux disease), Headache, High cholesterol, Hypertension, Lithium toxicity (06/2019), PONV (postoperative nausea and vomiting), Shortness of breath dyspnea, Sleep apnea, and Syncope and collapse.   She has a past surgical history that includes Endometrial ablation w/ novasure; Carpal tunnel release (2007); trigger finger release surgeyr ; Tubal  ligation; Knee arthroscopy; Knee arthroscopy with medial menisectomy (Right, 10/22/2013); Back surgery (2012); Colonoscopy; Excision metacarpal mass (Right, 03/10/2014); Total knee arthroplasty (Left, 04/16/2014); Irrigation and debridement knee (Left, 05/23/2014); Total knee arthroplasty (Right, 12/23/2014); and Excisional total knee arthroplasty with antibiotic spacers (Left, 09/19/2019).   Her family history includes Alcohol abuse in her mother; Arthritis in her mother; Breast cancer in her maternal grandmother; Cancer in her brother, daughter, and maternal grandmother; Gout in her father; Heart disease in her mother; Heart disease (age of onset: 50) in her father; Hyperlipidemia in her father; Hypertension in her father and sister.She reports that she has never smoked. She has never used smokeless tobacco. She reports that she does not drink alcohol and does not use drugs.    ROS Review of Systems  Constitutional: Negative.   HENT: Negative.    Eyes:  Negative for visual disturbance.  Respiratory:  Negative for shortness of breath.   Cardiovascular:  Negative for chest pain.  Gastrointestinal:  Negative for abdominal pain.  Musculoskeletal:  Negative for arthralgias.    Objective:  BP 119/74   Pulse 72   Temp 98 F (36.7 C)   Ht _0  (1.651 m)   Wt (!) 328 lb 3.2 oz (148.9 kg)   SpO2 92%   BMI 54.62 kg/m   BP Readings from Last 3 Encounters:  01/27/22 119/74  09/16/21 (!) 158/85  08/30/21 (!) 188/88    Wt Readings from Last 3  Encounters:  01/27/22 (!) 328 lb 3.2 oz (148.9 kg)  08/30/21 (!) 327 lb (148.3 kg)  08/25/21 (!) 337 lb 6.4 oz (153 kg)     Physical Exam Constitutional:      General: She is not in acute distress.    Appearance: She is well-developed. She is obese.  Cardiovascular:     Rate and Rhythm: Normal rate and regular rhythm.  Pulmonary:     Breath sounds: Normal breath sounds.  Musculoskeletal:        General: Normal range of motion.  Skin:     General: Skin is warm and dry.  Neurological:     Mental Status: She is alert and oriented to person, place, and time.       Assessment & Plan:   Tiahna was seen today for medical management of chronic issues.  Diagnoses and all orders for this visit:  Essential hypertension, benign -     CBC with Differential/Platelet -     CMP14+EGFR -     losartan (COZAAR) 50 MG tablet; Take 1 tablet (50 mg total) by mouth daily.  Hypothyroidism, unspecified type -     TSH + free T4 -     levothyroxine (SYNTHROID) 100 MCG tablet; TAKE 1 TABLET BY MOUTH EVERY DAY BEFORE BREAKFAST  Mixed hyperlipidemia -     Lipid panel  Need for shingles vaccine -     Varicella-zoster vaccine IM (Shingrix)  Snores -     Ambulatory referral to Sleep Studies       I have discontinued Levester Fresh. [place]agen's pregabalin, benzonatate, doxycycline, and dextromethorphan-guaiFENesin. I am also having her maintain her acetaminophen, albuterol, methocarbamol, fluocinonide cream, fluticasone, nabumetone, omeprazole, famotidine, promethazine, traZODone, sertraline, montelukast, pravastatin, gabapentin, metroNIDAZOLE, losartan, and levothyroxine.  Allergies as of 01/27/2022       Reactions   Chocolate Flavor    Other reaction(s): headache   Other Itching, Rash   Other reaction(s): other   Adhesive [tape] Other (See Comments)   Peeled off skin   Ceclor [cefaclor] Other (See Comments)   Blisters on hands   Chocolate Other (See Comments)   migraines   Venlafaxine Other (See Comments)   "refuses to take - makes me feel like I am dying"   Lisinopril Cough        Medication List        Accurate as of January 27, 2022  3:38 PM. If you have any questions, ask your nurse or doctor.          STOP taking these medications    benzonatate 200 MG capsule Commonly known as: TESSALON Stopped by: [name], MD   dextromethorphan-guaiFENesin 30-600 MG 12hr tablet Commonly known as: [place] DM Stopped by: [name], MD   doxycycline 100 MG tablet Commonly known as: VIBRA-TABS Stopped by: [name], MD   pregabalin 75 MG capsule Commonly known as: LYRICA Stopped by: [name], MD       TAKE these medications    acetaminophen 500 MG tablet Commonly known as: TYLENOL Take 2 tablets (1,000 mg total) by mouth every 8 (eight) hours.   albuterol 108 (90 Base) MCG/ACT inhaler Commonly known as: VENTOLIN HFA TAKE 2 PUFFS BY MOUTH EVERY 6 HOURS AS NEEDED FOR WHEEZE OR SHORTNESS OF BREATH   famotidine 20 MG tablet Commonly known as: PEPCID Take 1 tablet (20 mg total) by mouth 2 (two) times daily.   fluocinonide cream 0.05 % Commonly known as: LIDEX Apply 1 application topically 2 (two)  times daily.   fluticasone 50 MCG/ACT nasal spray Commonly known as: FLONASE Place 1 spray into both nostrils daily.   gabapentin 300 MG capsule Commonly known as: NEURONTIN Take 1 capsule by mouth 2 (two) times daily.   levothyroxine 100 MCG tablet Commonly known as: SYNTHROID TAKE 1 TABLET BY MOUTH EVERY DAY BEFORE BREAKFAST   losartan 50 MG tablet Commonly known as: COZAAR Take 1 tablet (50 mg total) by mouth daily.   methocarbamol 500 MG tablet Commonly known as: ROBAXIN Take 500 mg by mouth every 6 (six) hours as needed for muscle spasms.   metroNIDAZOLE 0.75 % gel Commonly known as: METROGEL Apply topically.   montelukast 10 MG tablet Commonly known as: SINGULAIR TAKE 1 TABLET BY MOUTH EVERYDAY AT BEDTIME   nabumetone 500 MG tablet Commonly known as: RELAFEN Take 2 tablets (1,000 mg total) by mouth 2 (two) times daily. For muscle and joint pain   omeprazole 40 MG capsule Commonly known as: PRILOSEC TAKE 1 CAPSULE BY MOUTH EVERY DAY   pravastatin 40 MG tablet Commonly known as: PRAVACHOL TAKE 1 TABLET BY MOUTH EVERYDAY AT BEDTIME   promethazine 25 MG tablet Commonly known as: PHENERGAN TAKE 1 TABLET BY MOUTH EVERY 6 HOURS AS NEEDED FOR NAUSEA AND VOMITING    sertraline 50 MG tablet Commonly known as: ZOLOFT TAKE 1 TABLET BY MOUTH EVERYDAY AT BEDTIME   traZODone 100 MG tablet Commonly known as: DESYREL TAKE 3 TABLETS (300 MG TOTAL) BY MOUTH AT BEDTIME         Follow-up: No follow-ups on file.  [name], M.D.

## 2022-01-28 LAB — CMP14+EGFR
ALT: 28 IU/L (ref 0–32)
AST: 29 IU/L (ref 0–40)
Albumin/Globulin Ratio: 1.5 (ref 1.2–2.2)
Albumin: 4.3 g/dL (ref 3.8–4.9)
Alkaline Phosphatase: 146 IU/L — ABNORMAL HIGH (ref 44–121)
BUN/Creatinine Ratio: 15 (ref 9–23)
BUN: 15 mg/dL (ref 6–24)
Bilirubin Total: 0.2 mg/dL (ref 0.0–1.2)
CO2: 26 mmol/L (ref 20–29)
Calcium: 9.7 mg/dL (ref 8.7–10.2)
Chloride: 100 mmol/L (ref 96–106)
Creatinine, Ser: 0.98 mg/dL (ref 0.57–1.00)
Globulin, Total: 2.9 g/dL (ref 1.5–4.5)
Glucose: 84 mg/dL (ref 70–99)
Potassium: 5.4 mmol/L — ABNORMAL HIGH (ref 3.5–5.2)
Sodium: 140 mmol/L (ref 134–144)
Total Protein: 7.2 g/dL (ref 6.0–8.5)
eGFR: 67 mL/min/{1.73_m2} (ref >59)

## 2022-01-28 LAB — CBC WITH DIFFERENTIAL/PLATELET
Basophils Absolute: 0.1 10*3/uL (ref 0.0–0.2)
Basos: 1 %
EOS (ABSOLUTE): 0.2 10*3/uL (ref 0.0–0.4)
Eos: 3 %
Hematocrit: 37.9 % (ref 34.0–46.6)
Hemoglobin: 12.2 g/dL (ref 11.1–15.9)
Immature Grans (Abs): 0 10*3/uL (ref 0.0–0.1)
Immature Granulocytes: 0 %
Lymphocytes Absolute: 1.8 10*3/uL (ref 0.7–3.1)
Lymphs: 24 %
MCH: 26.8 pg (ref 26.6–33.0)
MCHC: 32.2 g/dL (ref 31.5–35.7)
MCV: 83 fL (ref 79–97)
Monocytes Absolute: 0.6 10*3/uL (ref 0.1–0.9)
Monocytes: 8 %
Neutrophils Absolute: 4.9 10*3/uL (ref 1.4–7.0)
Neutrophils: 64 %
Platelets: 230 10*3/uL (ref 150–450)
RBC: 4.56 x10E6/uL (ref 3.77–5.28)
RDW: 15.7 % — ABNORMAL HIGH (ref 11.7–15.4)
WBC: 7.5 10*3/uL (ref 3.4–10.8)

## 2022-01-28 LAB — TSH+FREE T4
Free T4: 1.2 ng/dL (ref 0.82–1.77)
TSH: 2.54 u[IU]/mL (ref 0.450–4.500)

## 2022-01-28 LAB — LIPID PANEL
Chol/HDL Ratio: 6.4 ratio — ABNORMAL HIGH (ref 0.0–4.4)
Cholesterol, Total: 210 mg/dL — ABNORMAL HIGH (ref 100–199)
HDL: 33 mg/dL — ABNORMAL LOW (ref >39)
LDL Chol Calc (NIH): 122 mg/dL — ABNORMAL HIGH (ref 0–99)
Triglycerides: 313 mg/dL — ABNORMAL HIGH (ref 0–149)
VLDL Cholesterol Cal: 55 mg/dL — ABNORMAL HIGH (ref 5–40)

## 2022-02-01 ENCOUNTER — Other Ambulatory Visit: Payer: Self-pay | Admitting: Family Medicine

## 2022-02-01 MED ORDER — ROSUVASTATIN CALCIUM 40 MG PO TABS: 40.0000 mg | ORAL_TABLET | Freq: Every day | ORAL | 3 refills | Status: DC

## 2022-03-15 ENCOUNTER — Encounter: Payer: Self-pay | Admitting: Neurology

## 2022-03-15 ENCOUNTER — Institutional Professional Consult (permissible substitution): Payer: Medicare HMO | Admitting: Neurology

## 2022-04-04 ENCOUNTER — Other Ambulatory Visit: Payer: Self-pay | Admitting: Family Medicine

## 2022-04-04 DIAGNOSIS — F5101 Primary insomnia: Secondary | ICD-10-CM

## 2022-04-04 DIAGNOSIS — F331 Major depressive disorder, recurrent, moderate: Secondary | ICD-10-CM

## 2022-04-05 ENCOUNTER — Other Ambulatory Visit: Payer: Self-pay | Admitting: Family Medicine

## 2022-04-05 DIAGNOSIS — F5101 Primary insomnia: Secondary | ICD-10-CM

## 2022-04-06 NOTE — Telephone Encounter (Signed)
To pharmacy: **PLEASE RESEND REFILL FOR THIS RX. PATIENT HAS RX FOR TRAZODONE 300MG  HS BUT THAT SINGLE TAB IS MUCH MORE COSTLY THAN THREE 100MG  TRAZODONE

## 2022-04-06 NOTE — Telephone Encounter (Signed)
Pharmacy comment: Alternative Requested:PLEASE RESEND AS 100MG  ; TAKE 3 TABS AT HS; MUCH LESS EXPENSIVE.

## 2022-05-01 ENCOUNTER — Other Ambulatory Visit: Payer: Self-pay | Admitting: Family Medicine

## 2022-06-22 ENCOUNTER — Other Ambulatory Visit: Payer: Self-pay | Admitting: Family Medicine

## 2022-06-22 DIAGNOSIS — F331 Major depressive disorder, recurrent, moderate: Secondary | ICD-10-CM

## 2022-07-03 ENCOUNTER — Other Ambulatory Visit: Payer: Self-pay | Admitting: Family Medicine

## 2022-07-03 DIAGNOSIS — F5101 Primary insomnia: Secondary | ICD-10-CM

## 2022-07-14 ENCOUNTER — Ambulatory Visit (INDEPENDENT_AMBULATORY_CARE_PROVIDER_SITE_OTHER): Payer: Medicare HMO

## 2022-07-14 VITALS — Ht 64.0 in | Wt 330.0 lb

## 2022-07-14 DIAGNOSIS — Z1211 Encounter for screening for malignant neoplasm of colon: Secondary | ICD-10-CM

## 2022-07-14 DIAGNOSIS — Z0000 Encounter for general adult medical examination without abnormal findings: Secondary | ICD-10-CM

## 2022-07-14 NOTE — Patient Instructions (Signed)
Ms. [name] , Thank you for taking time to come for your Medicare Wellness Visit. I appreciate your ongoing commitment to your health goals. Please review the following plan we discussed and let me know if I can assist you in the future.   These are the goals we discussed:  Goals      DIET - EAT MORE FRUITS AND VEGETABLES     DIET - INCREASE WATER INTAKE     Patient Stated     07/10/2020 AWV Goal: Exercise for General Health  Patient will verbalize understanding of the benefits of increased physical activity: Exercising regularly is important. It will improve your overall fitness, flexibility, and endurance. Regular exercise also will improve your overall health. It can help you control your weight, reduce stress, and improve your bone density. Over the next year, patient will increase physical activity as tolerated with a goal of at least 150 minutes of moderate physical activity per week.  You can tell that you are exercising at a moderate intensity if your heart starts beating faster and you start breathing faster but can still hold a conversation. Moderate-intensity exercise ideas include: Walking 1 mile (1.6 km) in about 15 minutes Biking Hiking Golfing Dancing Water aerobics Patient will verbalize understanding of everyday activities that increase physical activity by providing examples like the following: Yard work, such as: Sales promotion account executive Gardening Washing windows or floors Patient will be able to explain general safety guidelines for exercising:  Before you start a new exercise program, talk with your health care provider. Do not exercise so much that you hurt yourself, feel dizzy, or get very short of breath. Wear comfortable clothes and wear shoes with good support. Drink plenty of water while you exercise to prevent dehydration or heat stroke. Work out until your breathing and your  heartbeat get faster.      Prevent falls        This is a list of the screening recommended for you and due dates:  Health Maintenance  Topic Date Due   Pap Smear  Never done   Colon Cancer Screening  09/25/2021   COVID-19 Vaccine (7 - 2023-24 season) 06/21/2022   Mammogram  09/15/2022   Medicare Annual Wellness Visit  07/15/2023   DTaP/Tdap/Td vaccine (3 - Td or Tdap) 07/26/2023   Flu Shot  Completed   Hepatitis C Screening: USPSTF Recommendation to screen - Ages 18-79 yo.  Completed   HIV Screening  Completed   Zoster (Shingles) Vaccine  Completed   HPV Vaccine  Aged Out    Advanced directives: Advance directive discussed with you today. I have provided a copy for you to complete at home and have notarized. Once this is complete please bring a copy in to our office so we can scan it into your chart.   Conditions/risks identified: Aim for 30 minutes of exercise or brisk walking, 6-8 glasses of water, and 5 servings of fruits and vegetables each day.   Next appointment: Follow up in one year for your annual wellness visit    Preventive Care 65 Years and Older, Female Preventive care refers to lifestyle choices and visits with your health care provider that can promote health and wellness. What does preventive care include? A yearly physical exam. This is also called an annual well check. Dental exams once or twice a year. Routine eye exams. Ask your health care provider how often you should have  your eyes checked. Personal lifestyle choices, including: Daily care of your teeth and gums. Regular physical activity. Eating a healthy diet. Avoiding tobacco and drug use. Limiting alcohol use. Practicing safe sex. Taking low-dose aspirin every day. Taking vitamin and mineral supplements as recommended by your health care provider. What happens during an annual well check? The services and screenings done by your health care provider during your annual well check will depend on  your age, overall health, lifestyle risk factors, and family history of disease. Counseling  Your health care provider may ask you questions about your: Alcohol use. Tobacco use. Drug use. Emotional well-being. Home and relationship well-being. Sexual activity. Eating habits. History of falls. Memory and ability to understand (cognition). Work and work Astronomer. Reproductive health. Screening  You may have the following tests or measurements: Height, weight, and BMI. Blood pressure. Lipid and cholesterol levels. These may be checked every 5 years, or more frequently if you are over 35 years old. Skin check. Lung cancer screening. You may have this screening every year starting at age 38 if you have a 30-pack-year history of smoking and currently smoke or have quit within the past 15 years. Fecal occult blood test (FOBT) of the stool. You may have this test every year starting at age 60. Flexible sigmoidoscopy or colonoscopy. You may have a sigmoidoscopy every 5 years or a colonoscopy every 10 years starting at age 76. Hepatitis C blood test. Hepatitis B blood test. Sexually transmitted disease (STD) testing. Diabetes screening. This is done by checking your blood sugar (glucose) after you have not eaten for a while (fasting). You may have this done every 1-3 years. Bone density scan. This is done to screen for osteoporosis. You may have this done starting at age 3. Mammogram. This may be done every 1-2 years. Talk to your health care provider about how often you should have regular mammograms. Talk with your health care provider about your test results, treatment options, and if necessary, the need for more tests. Vaccines  Your health care provider may recommend certain vaccines, such as: Influenza vaccine. This is recommended every year. Tetanus, diphtheria, and acellular pertussis (Tdap, Td) vaccine. You may need a Td booster every 10 years. Zoster vaccine. You may need this  after age 59. Pneumococcal 13-valent conjugate (PCV13) vaccine. One dose is recommended after age 60. Pneumococcal polysaccharide (PPSV23) vaccine. One dose is recommended after age 20. Talk to your health care provider about which screenings and vaccines you need and how often you need them. This information is not intended to replace advice given to you by your health care provider. Make sure you discuss any questions you have with your health care provider. Document Released: 08/07/2015 Document Revised: 03/30/2016 Document Reviewed: 05/12/2015 Elsevier Interactive Patient Education  2017 [employer].  Fall Prevention in the Home Falls can cause injuries. They can happen to people of all ages. There are many things you can do to make your home safe and to help prevent falls. What can I do on the outside of my home? Regularly fix the edges of walkways and driveways and fix any cracks. Remove anything that might make you trip as you walk through a door, such as a raised step or threshold. Trim any bushes or trees on the path to your home. Use bright outdoor lighting. Clear any walking paths of anything that might make someone trip, such as rocks or tools. Regularly check to see if handrails are loose or broken. Make sure that  both sides of any steps have handrails. Any raised decks and porches should have guardrails on the edges. Have any leaves, snow, or ice cleared regularly. Use sand or salt on walking paths during winter. Clean up any spills in your garage right away. This includes oil or grease spills. What can I do in the bathroom? Use night lights. Install grab bars by the toilet and in the tub and shower. Do not use towel bars as grab bars. Use non-skid mats or decals in the tub or shower. If you need to sit down in the shower, use a plastic, non-slip stool. Keep the floor dry. Clean up any water that spills on the floor as soon as it happens. Remove soap buildup in the tub or  shower regularly. Attach bath mats securely with double-sided non-slip rug tape. Do not have throw rugs and other things on the floor that can make you trip. What can I do in the bedroom? Use night lights. Make sure that you have a light by your bed that is easy to reach. Do not use any sheets or blankets that are too big for your bed. They should not hang down onto the floor. Have a firm chair that has side arms. You can use this for support while you get dressed. Do not have throw rugs and other things on the floor that can make you trip. What can I do in the kitchen? Clean up any spills right away. Avoid walking on wet floors. Keep items that you use a lot in easy-to-reach places. If you need to reach something above you, use a strong step stool that has a grab bar. Keep electrical cords out of the way. Do not use floor polish or wax that makes floors slippery. If you must use wax, use non-skid floor wax. Do not have throw rugs and other things on the floor that can make you trip. What can I do with my stairs? Do not leave any items on the stairs. Make sure that there are handrails on both sides of the stairs and use them. Fix handrails that are broken or loose. Make sure that handrails are as long as the stairways. Check any carpeting to make sure that it is firmly attached to the stairs. Fix any carpet that is loose or worn. Avoid having throw rugs at the top or bottom of the stairs. If you do have throw rugs, attach them to the floor with carpet tape. Make sure that you have a light switch at the top of the stairs and the bottom of the stairs. If you do not have them, ask someone to add them for you. What else can I do to help prevent falls? Wear shoes that: Do not have high heels. Have rubber bottoms. Are comfortable and fit you well. Are closed at the toe. Do not wear sandals. If you use a stepladder: Make sure that it is fully opened. Do not climb a closed stepladder. Make  sure that both sides of the stepladder are locked into place. Ask someone to hold it for you, if possible. Clearly mark and make sure that you can see: Any grab bars or handrails. First and last steps. Where the edge of each step is. Use tools that help you move around (mobility aids) if they are needed. These include: Canes. Walkers. Scooters. Crutches. Turn on the lights when you go into a dark area. Replace any light bulbs as soon as they burn out. Set up your furniture so  you have a clear path. Avoid moving your furniture around. If any of your floors are uneven, fix them. If there are any pets around you, be aware of where they are. Review your medicines with your doctor. Some medicines can make you feel dizzy. This can increase your chance of falling. Ask your doctor what other things that you can do to help prevent falls. This information is not intended to replace advice given to you by your health care provider. Make sure you discuss any questions you have with your health care provider. Document Released: 05/07/2009 Document Revised: 12/17/2015 Document Reviewed: 08/15/2014 Elsevier Interactive Patient Education  2017 [employer].

## 2022-07-14 NOTE — Progress Notes (Signed)
Subjective:   [name] is a 61 y.o. female who presents for Medicare Annual (Subsequent) preventive examination. I connected with  [name] on 07/14/22 by a audio enabled telemedicine application and verified that I am speaking with the correct person using two identifiers.  Patient Location: Home  Provider Location: Home Office  I discussed the limitations of evaluation and management by telemedicine. The patient expressed understanding and agreed to proceed.  Review of Systems     Cardiac Risk Factors include: advanced age (>5255men, 57>65 women);hypertension;dyslipidemia     Objective:    Today's Vitals   07/14/22 1037  Weight: (!) 330 lb (149.7 kg)  Height: 5\' 4"  (1.626 m)   Body mass index is 56.64 kg/m.     07/14/2022   10:40 AM 07/13/2021   10:42 AM 07/10/2020    9:37 AM 10/21/2019    7:37 PM 09/19/2019    5:00 PM 09/06/2019   10:23 AM 07/09/2019   11:41 AM  Advanced Directives  Does Patient Have a Medical Advance Directive? No No No No No No No  Would patient like information on creating a medical advance directive? No - Patient declined No - Patient declined No - Patient declined  No - Patient declined No - Patient declined Yes (ED - Information included in AVS)    Current Medications (verified) Outpatient Encounter Medications as of 07/14/2022  Medication Sig   acetaminophen (TYLENOL) 500 MG tablet Take 2 tablets (1,000 mg total) by mouth every 8 (eight) hours.   albuterol (VENTOLIN HFA) 108 (90 Base) MCG/ACT inhaler TAKE 2 PUFFS BY MOUTH EVERY 6 HOURS AS NEEDED FOR WHEEZE OR SHORTNESS OF BREATH   famotidine (PEPCID) 20 MG tablet Take 1 tablet (20 mg total) by mouth 2 (two) times daily.   fluocinonide cream (LIDEX) 0.05 % Apply 1 application topically 2 (two) times daily.   fluticasone (FLONASE) 50 MCG/ACT nasal spray Place 1 spray into both nostrils daily.   gabapentin (NEURONTIN) 300 MG capsule Take 1 capsule by mouth 2 (two) times daily.    levothyroxine (SYNTHROID) 100 MCG tablet TAKE 1 TABLET BY MOUTH EVERY DAY BEFORE BREAKFAST   losartan (COZAAR) 50 MG tablet Take 1 tablet (50 mg total) by mouth daily.   methocarbamol (ROBAXIN) 500 MG tablet Take 500 mg by mouth every 6 (six) hours as needed for muscle spasms.   metroNIDAZOLE (METROGEL) 0.75 % gel Apply topically.   montelukast (SINGULAIR) 10 MG tablet TAKE 1 TABLET BY MOUTH EVERYDAY AT BEDTIME   nabumetone (RELAFEN) 500 MG tablet TAKE 2 TABLETS (1,000 MG TOTAL) BY MOUTH 2 (TWO) TIMES DAILY. FOR MUSCLE AND JOINT PAIN   omeprazole (PRILOSEC) 40 MG capsule TAKE 1 CAPSULE BY MOUTH EVERY DAY   promethazine (PHENERGAN) 25 MG tablet TAKE 1 TABLET BY MOUTH EVERY 6 HOURS AS NEEDED FOR NAUSEA AND VOMITING   rosuvastatin (CRESTOR) 40 MG tablet Take 1 tablet (40 mg total) by mouth daily. For cholesterol   sertraline (ZOLOFT) 50 MG tablet TAKE 1 TABLET BY MOUTH EVERYDAY AT BEDTIME   traZODone (DESYREL) 100 MG tablet TAKE 3 TABLETS (300 MG TOTAL) BY MOUTH AT BEDTIME   traZODone (DESYREL) 100 MG tablet TAKE 3 TABLETS (300MG ) BY MOUTH EVERY DAY AT BEDTIME   No facility-administered encounter medications on file as of 07/14/2022.    Allergies (verified) Chocolate flavor, Other, Adhesive [tape], Ceclor [cefaclor], Chocolate, Venlafaxine, and Lisinopril   History: Past Medical History:  Diagnosis Date   Anemia    hx of  with pregnancy    Anxiety    ARF (acute renal failure) (HCC) 07/16/2019   Arthritis    Asthma    Colon polyps    Depression    GERD (gastroesophageal reflux disease)    Headache    occasional headaches    High cholesterol    Hypertension    Lithium toxicity 06/2019   has since resolved after discontinuing    PONV (postoperative nausea and vomiting)    Shortness of breath dyspnea    due to weight gain    Sleep apnea    has not use CPAP machine in 2 yrs/ DOES NOT KNOW IF SHE NEEDS TO USE C - PAP   Syncope and collapse    due to severe knee pain    Past  Surgical History:  Procedure Laterality Date   BACK SURGERY  2012   lumb fusion   CARPAL TUNNEL RELEASE  2007   both hands   COLONOSCOPY     ENDOMETRIAL ABLATION W/ NOVASURE     EXCISION METACARPAL MASS Right 03/10/2014   Procedure: EXCISION MASS RIGHT HAND FIRST WEB SPACE DORSAL PALMAR INCISION ;  Surgeon: Cindee Salt, MD;  Location: [facility];  Service: Orthopedics;  Laterality: Right;   EXCISIONAL TOTAL KNEE ARTHROPLASTY WITH ANTIBIOTIC SPACERS Left 09/19/2019   Procedure: EXCISIONAL  LEFT TOTAL KNEE ARTHROPLASTY WITH TOTAL KNEE REVISION;  Surgeon: Durene Romans, MD;  Location: [facility];  Service: Orthopedics;  Laterality: Left;  2 hrs   IRRIGATION AND DEBRIDEMENT KNEE Left 05/23/2014   Procedure: IRRIGATION AND DEBRIDEMENT KNEE;  Surgeon: [name], MD;  Location: [facility];  Service: Orthopedics;  Laterality: Left;  With POLYETHYLENE EXCHANGE   KNEE ARTHROSCOPY     let knee 03/2013    KNEE ARTHROSCOPY WITH MEDIAL MENISECTOMY Right 10/22/2013   Procedure: RIGHT KNEE ARTHROSCOPY WITH MEDIAL MENISECTOMY, abrasion chrondrplasty Brigid Re OUT/CORRECT CURETTEMENT/BONE GRAFT/PROXIMAL TIBIA ;  Surgeon: Jacki Cones, MD;  Location: [facility];  Service: Orthopedics;  Laterality: Right;   TOTAL KNEE ARTHROPLASTY Left 04/16/2014   Procedure: LEFT TOTAL KNEE ARTHROPLASTY;  Surgeon: Jacki Cones, MD;  Location: [facility];  Service: Orthopedics;  Laterality: Left;   TOTAL KNEE ARTHROPLASTY Right 12/23/2014   Procedure: TOTAL KNEE ARTHROPLASTY;  Surgeon: Ranee Gosselin, MD;  Location: [facility];  Service: Orthopedics;  Laterality: Right;   trigger finger release surgeyr      bilateral    TUBAL LIGATION     Family History  Problem Relation Age of Onset   Alcohol abuse Mother    Arthritis Mother    Heart disease Mother    Hyperlipidemia Father    Hypertension Father    Heart disease Father 75       CABG   Gout Father    Hypertension Sister    Cancer Daughter        NEUROBLASTOMA    Breast cancer Maternal Grandmother    Cancer Maternal Grandmother        breast   Cancer Brother        colon, lung, liver   Social History   Socioeconomic History   Marital status: Married    Spouse name: Trey Paula   Number of children: 2   Years of education: 12   Highest education level: Some college, no degree  Occupational History    Comment: Disabled  Tobacco Use   Smoking status: Never   Smokeless tobacco: Never  Vaping Use   Vaping Use: Never used  Substance and Sexual Activity   Alcohol use: No   Drug use: No   Sexual activity: Not on file  Other Topics Concern   Not on file  Social History Narrative   Lives with her husband who helps with grocery shopping and other transportation needs.     Has two children, Billey Gosling and Clydie Braun and grandchildren - they all live nearby.   There are two outside dogs and several  Horses.    Social Determinants of Health   Financial Resource Strain: Low Risk  (07/14/2022)   Overall Financial Resource Strain (CARDIA)    Difficulty of Paying Living Expenses: Not hard at all  Food Insecurity: No Food Insecurity (07/14/2022)   Hunger Vital Sign    Worried About Running Out of Food in the Last Year: Never true    Ran Out of Food in the Last Year: Never true  Transportation Needs: No Transportation Needs (07/13/2021)   PRAPARE - Administrator, Civil Service (Medical): No    Lack of Transportation (Non-Medical): No  Physical Activity: Inactive (07/14/2022)   Exercise Vital Sign    Days of Exercise per Week: 0 days    Minutes of Exercise per Session: 0 min  Stress: No Stress Concern Present (07/14/2022)   [employer] of Occupational Health - Occupational Stress Questionnaire    Feeling of Stress : Not at all  Social Connections: Moderately Integrated (07/14/2022)   Social Connection and Isolation Panel [NHANES]    Frequency of Communication with Friends and Family: More than three times a week    Frequency of Social  Gatherings with Friends and Family: More than three times a week    Attends Religious Services: More than 4 times per year    Active Member of [employer] or Organizations: No    Attends Engineer, structural: Never    Marital Status: Married    Tobacco Counseling Counseling given: Not Answered   Clinical Intake:  Pre-visit preparation completed: Yes  Pain : No/denies pain     Nutritional Risks: None Diabetes: No  How often do you need to have someone help you when you read instructions, pamphlets, or other written materials from your doctor or pharmacy?: 1 - Never  Diabetic?no   Interpreter Needed?: No  Information entered by :: Renie Ora, LPN   Activities of Daily Living    07/14/2022   10:40 AM  In your present state of health, do you have any difficulty performing the following activities:  Hearing? 0  Vision? 0  Difficulty concentrating or making decisions? 0  Walking or climbing stairs? 0  Dressing or bathing? 0  Doing errands, shopping? 0  Preparing Food and eating ? N  Using the Toilet? N  In the past six months, have you accidently leaked urine? N  Do you have problems with loss of bowel control? N  Managing your Medications? N  Managing your Finances? N  Housekeeping or managing your Housekeeping? N    Patient Care Team: [name], MD as PCP - General (Family Medicine) Cottle, Steva Ready., MD as Attending Physician (Psychiatry) Ranee Gosselin, MD as Consulting Physician (Orthopedic Surgery)  Indicate any recent Medical Services you may have received from other than [facility] providers in the past year (date may be approximate).     Assessment:   This is a routine wellness examination for [name].  Hearing/Vision screen Vision Screening - Comments:: Wears rx glasses - up to date with routine eye exams with  Dr.Lee  Dietary issues and exercise activities discussed: Current Exercise Habits: The patient does not participate in regular  exercise at present   Goals Addressed             This Visit's Progress    DIET - EAT MORE FRUITS AND VEGETABLES   On track      Depression Screen    07/14/2022   10:40 AM 01/27/2022    3:19 PM 01/27/2022    3:02 PM 08/30/2021   12:38 PM 08/25/2021    4:13 PM 08/25/2021    4:09 PM 07/13/2021   10:41 AM  PHQ 2/9 Scores  PHQ - 2 Score 0 4 0 4 4 0 2  PHQ- 9 Score  5  14 18  9     Fall Risk    07/14/2022   10:38 AM 01/27/2022    3:11 PM 01/27/2022    3:02 PM 08/25/2021    4:09 PM 07/13/2021   10:42 AM  Fall Risk   Falls in the past year? 0 1 0 1 1  Number falls in past yr: 0 0  0 0  Injury with Fall? 0 0  1 1  Risk for fall due to : No Fall Risks History of fall(s)  History of fall(s);Medication side effect;Orthopedic patient History of fall(s);Medication side effect;Orthopedic patient  Follow up Falls prevention discussed Falls evaluation completed  Falls evaluation completed;Education provided Falls prevention discussed;Education provided    FALL RISK PREVENTION PERTAINING TO THE HOME:  Any stairs in or around the home? Yes  If so, are there any without handrails? No  Home free of loose throw rugs in walkways, pet beds, electrical cords, etc? Yes  Adequate lighting in your home to reduce risk of falls? Yes   ASSISTIVE DEVICES UTILIZED TO PREVENT FALLS:  Life alert? No  Use of a cane, walker or w/c? No  Grab bars in the bathroom? Yes  Shower chair or bench in shower? Yes  Elevated toilet seat or a handicapped toilet? Yes          07/14/2022   10:41 AM 07/13/2021   10:50 AM 07/10/2020    9:41 AM 07/09/2019   11:09 AM  6CIT Screen  What Year? 0 points 0 points 0 points 0 points  What month? 0 points 0 points 0 points 0 points  What time? 0 points 0 points 0 points 0 points  Count back from 20 0 points 0 points 0 points 0 points  Months in reverse 0 points 0 points 0 points 2 points  Repeat phrase 0 points 4 points 0 points 2 points  Total Score 0 points 4 points 0  points 4 points    Immunizations Immunization History  Administered Date(s) Administered   DTaP 07/25/2013   Influenza Inj Mdck Quad Pf 04/29/2021, 03/25/2022   Influenza Split 05/08/2013   Influenza, Seasonal, Injecte, Preservative Fre 07/07/2014   Influenza,inj,Quad PF,6+ Mos 07/07/2014, 05/14/2018, 04/02/2019, 03/31/2020   Influenza-Unspecified 02/23/2016, 05/25/2021   Moderna Covid-19 Vaccine Bivalent Booster 36yrs & up 06/01/2021   Moderna SARS-COV2 Booster Vaccination 12/15/2020   Moderna Sars-Covid-2 Vaccination 11/14/2019, 12/12/2019, 06/13/2020   PNEUMOCOCCAL CONJUGATE-20 05/24/2021   Pneumococcal Conjugate-13 12/27/2010   Pneumococcal Polysaccharide-23 05/04/2011   Pneumococcal-Unspecified 05/04/2011, 05/25/2021   Tdap 02/25/2013   Unspecified SARS-COV-2 Vaccination 04/26/2022   Zoster Recombinat (Shingrix) 08/25/2021, 01/27/2022    TDAP status: Up to date  Flu Vaccine status: Up to date  Pneumococcal vaccine status: Up to date  Covid-19 vaccine status: Completed vaccines  Qualifies for Shingles Vaccine? Yes   Zostavax completed Yes   Shingrix Completed?: Yes  Screening Tests Health Maintenance  Topic Date Due   PAP SMEAR-Modifier  Never done   COLONOSCOPY (Pts 45-32yrs Insurance coverage will need to be confirmed)  09/25/2021   COVID-19 Vaccine (7 - 2023-24 season) 06/21/2022   MAMMOGRAM  09/15/2022   Medicare Annual Wellness (AWV)  07/15/2023   DTaP/Tdap/Td (3 - Td or Tdap) 07/26/2023   INFLUENZA VACCINE  Completed   Hepatitis C Screening  Completed   HIV Screening  Completed   Zoster Vaccines- Shingrix  Completed   HPV VACCINES  Aged Out    Health Maintenance  Health Maintenance Due  Topic Date Due   PAP SMEAR-Modifier  Never done   COLONOSCOPY (Pts 45-107yrs Insurance coverage will need to be confirmed)  09/25/2021   COVID-19 Vaccine (7 - 2023-24 season) 06/21/2022    Colorectal cancer screening: Referral to GI placed 07/14/2022. Pt aware the  office will call re: appt.  Mammogram status: Completed 09/15/2021. Repeat every year  Bone Density status: Ordered not of age . Pt provided with contact info and advised to call to schedule appt.  Lung Cancer Screening: (Low Dose CT Chest recommended if Age 76-80 years, 30 pack-year currently smoking OR have quit w/in 15years.) does not qualify.   Lung Cancer Screening Referral: n/a  Additional Screening:  Hepatitis C Screening: does not qualify;   Vision Screening: Recommended annual ophthalmology exams for early detection of glaucoma and other disorders of the eye. Is the patient up to date with their annual eye exam?  Yes  Who is the provider or what is the name of the office in which the patient attends annual eye exams? Dr.Lee  If pt is not established with a provider, would they like to be referred to a provider to establish care? No .   Dental Screening: Recommended annual dental exams for proper oral hygiene  Community Resource Referral / Chronic Care Management: CRR required this visit?  No   CCM required this visit?  No      Plan:     I have personally reviewed and noted the following in the patient's chart:   Medical and social history Use of alcohol, tobacco or illicit drugs  Current medications and supplements including opioid prescriptions. Patient is not currently taking opioid prescriptions. Functional ability and status Nutritional status Physical activity Advanced directives List of other physicians Hospitalizations, surgeries, and ER visits in previous 12 months Vitals Screenings to include cognitive, depression, and falls Referrals and appointments  In addition, I have reviewed and discussed with patient certain preventive protocols, quality metrics, and best practice recommendations. A written personalized care plan for preventive services as well as general preventive health recommendations were provided to patient.     Lorrene Reid,  LPN   [id number]   Nurse Notes: Referral Colonoscopy 07/14/2022

## 2022-07-18 ENCOUNTER — Other Ambulatory Visit: Payer: Self-pay | Admitting: Family Medicine

## 2022-07-18 DIAGNOSIS — R110 Nausea: Secondary | ICD-10-CM

## 2022-08-01 ENCOUNTER — Encounter: Payer: Self-pay | Admitting: Family Medicine

## 2022-08-01 ENCOUNTER — Ambulatory Visit (INDEPENDENT_AMBULATORY_CARE_PROVIDER_SITE_OTHER): Payer: Medicare HMO | Admitting: Family Medicine

## 2022-08-01 VITALS — BP 144/80 | HR 95 | Temp 97.7°F | Ht 64.0 in | Wt 346.0 lb

## 2022-08-01 DIAGNOSIS — I10 Essential (primary) hypertension: Secondary | ICD-10-CM

## 2022-08-01 DIAGNOSIS — K219 Gastro-esophageal reflux disease without esophagitis: Secondary | ICD-10-CM

## 2022-08-01 DIAGNOSIS — E039 Hypothyroidism, unspecified: Secondary | ICD-10-CM | POA: Diagnosis not present

## 2022-08-01 DIAGNOSIS — Z6841 Body Mass Index (BMI) 40.0 and over, adult: Secondary | ICD-10-CM | POA: Diagnosis not present

## 2022-08-01 DIAGNOSIS — F5101 Primary insomnia: Secondary | ICD-10-CM

## 2022-08-01 DIAGNOSIS — E782 Mixed hyperlipidemia: Secondary | ICD-10-CM | POA: Diagnosis not present

## 2022-08-01 DIAGNOSIS — F331 Major depressive disorder, recurrent, moderate: Secondary | ICD-10-CM

## 2022-08-01 DIAGNOSIS — R69 Illness, unspecified: Secondary | ICD-10-CM | POA: Diagnosis not present

## 2022-08-01 MED ORDER — OMEPRAZOLE 40 MG PO CPDR: DELAYED_RELEASE_CAPSULE | ORAL | 3 refills | Status: DC

## 2022-08-01 MED ORDER — SERTRALINE HCL 50 MG PO TABS: ORAL_TABLET | ORAL | 3 refills | Status: DC

## 2022-08-01 MED ORDER — LOSARTAN POTASSIUM 50 MG PO TABS: 50.0000 mg | ORAL_TABLET | Freq: Every day | ORAL | 3 refills | Status: DC

## 2022-08-01 MED ORDER — TRAZODONE HCL 100 MG PO TABS: ORAL_TABLET | ORAL | 3 refills | Status: DC

## 2022-08-01 MED ORDER — METRONIDAZOLE 0.75 % EX GEL: Freq: Every day | CUTANEOUS | 5 refills | Status: DC

## 2022-08-01 MED ORDER — AMOXICILLIN 875 MG PO TABS: 875.0000 mg | ORAL_TABLET | Freq: Two times a day (BID) | ORAL | 0 refills | Status: AC

## 2022-08-01 NOTE — Progress Notes (Signed)
Subjective:  Patient ID: [name], female    DOB: March 01, 1961  Age: 62 y.o. MRN: [id number]  CC: Medical Management of Chronic Issues   HPI [name] presents for  follow-up on  thyroid. The patient has a history of hypothyroidism for many years. It has been stable recently. Pt. denies any change in  voice, loss of hair, heat or cold intolerance. Energy level has been poor. Patient has chronic constipation and diarrhea. No myxedema. Medication is as noted below. Verified that pt is taking it daily on an empty stomach. Well tolerated.  Off BP med. No reason. Just off track.Home readings [id number] systolic.     08/01/2022    3:08 PM 08/01/2022    2:54 PM 07/14/2022   10:40 AM  Depression screen PHQ 2/9  Decreased Interest 1 0 0  Down, Depressed, Hopeless 1 0 0  PHQ - 2 Score 2 0 0  Altered sleeping 1    Tired, decreased energy 1    Change in appetite 2    Feeling bad or failure about yourself  2    Trouble concentrating 0    Moving slowly or fidgety/restless 0    Suicidal thoughts 0    PHQ-9 Score 8    Difficult doing work/chores Not difficult at all      History [name] has a past medical history of Anemia, Anxiety, ARF (acute renal failure) ([place]) (07/16/2019), Arthritis, Asthma, Colon polyps, Depression, GERD (gastroesophageal reflux disease), Headache, High cholesterol, Hypertension, Lithium toxicity (06/2019), PONV (postoperative nausea and vomiting), Shortness of breath dyspnea, Sleep apnea, and Syncope and collapse.   She has a past surgical history that includes Endometrial ablation w/ novasure; Carpal tunnel release (2007); trigger finger release surgeyr ; Tubal ligation; Knee arthroscopy; Knee arthroscopy with medial menisectomy (Right, 10/22/2013); Back surgery (2012); Colonoscopy; Excision metacarpal mass (Right, 03/10/2014); Total knee arthroplasty (Left, 04/16/2014); Irrigation and debridement knee (Left, 05/23/2014); Total knee arthroplasty (Right, 12/23/2014); and  Excisional total knee arthroplasty with antibiotic spacers (Left, 09/19/2019).   Her family history includes Alcohol abuse in her mother; Arthritis in her mother; Breast cancer in her maternal grandmother; Cancer in her brother, daughter, and maternal grandmother; Gout in her father; Heart disease in her mother; Heart disease (age of onset: 21) in her father; Hyperlipidemia in her father; Hypertension in her father and sister.She reports that she has never smoked. She has never used smokeless tobacco. She reports that she does not drink alcohol and does not use drugs.    ROS Review of Systems  Constitutional:  Positive for fatigue.  HENT: Negative.    Eyes:  Negative for visual disturbance.  Respiratory:  Negative for shortness of breath.   Cardiovascular:  Negative for chest pain.  Gastrointestinal:  Negative for abdominal pain.  Musculoskeletal:  Negative for arthralgias.    Objective:  BP (!) 144/80   Pulse 95   Temp 97.7 F (36.5 C)   Ht 5\' 4"  (1.626 m)   Wt (!) 346 lb (156.9 kg)   SpO2 91%   BMI 59.39 kg/m   BP Readings from Last 3 Encounters:  08/01/22 (!) 144/80  01/27/22 119/74  09/16/21 (!) 158/85    Wt Readings from Last 3 Encounters:  08/01/22 (!) 346 lb (156.9 kg)  07/14/22 (!) 330 lb (149.7 kg)  01/27/22 (!) 328 lb 3.2 oz (148.9 kg)     Physical Exam Constitutional:      General: She is not in acute distress.    Appearance: She  is well-developed. She is obese.  Cardiovascular:     Rate and Rhythm: Normal rate and regular rhythm.  Pulmonary:     Breath sounds: Normal breath sounds.  Musculoskeletal:        General: Normal range of motion.  Skin:    General: Skin is warm and dry.  Neurological:     Mental Status: She is alert and oriented to person, place, and time.       Assessment & Plan:   [name] was seen today for medical management of chronic issues.  Diagnoses and all orders for this visit:  Hypothyroidism, unspecified type -     TSH +  free T4  Mixed hyperlipidemia -     Lipid panel  Essential hypertension, benign -     CBC with Differential/Platelet -     CMP14+EGFR -     losartan (COZAAR) 50 MG tablet; Take 1 tablet (50 mg total) by mouth daily.  Gastroesophageal reflux disease, unspecified whether esophagitis present -     omeprazole (PRILOSEC) 40 MG capsule; TAKE 1 CAPSULE BY MOUTH EVERY DAY  Moderate episode of recurrent major depressive disorder (HCC) -     sertraline (ZOLOFT) 50 MG tablet; TAKE 1 TABLET BY MOUTH EVERYDAY AT BEDTIME  Primary insomnia -     traZODone (DESYREL) 100 MG tablet; TAKE 3 TABLETS (300MG ) BY MOUTH EVERY DAY AT BEDTIME  Morbid obesity with BMI of 50.0-59.9, adult (HCC)  Other orders -     metroNIDAZOLE (METROGEL) 0.75 % gel; Apply topically daily. -     amoxicillin (AMOXIL) 875 MG tablet; Take 1 tablet (875 mg total) by mouth 2 (two) times daily for 10 days.       I have changed 11-20-1994. [name]'s metroNIDAZOLE. I am also having her start on amoxicillin. Additionally, I am having her maintain her acetaminophen, albuterol, methocarbamol, fluocinonide cream, fluticasone, famotidine, gabapentin, levothyroxine, rosuvastatin, montelukast, nabumetone, promethazine, omeprazole, sertraline, traZODone, and losartan.  Allergies as of 08/01/2022       Reactions   Chocolate Flavor    Other reaction(s): headache   Other Itching, Rash   Other reaction(s): other   Adhesive [tape] Other (See Comments)   Peeled off skin   Ceclor [cefaclor] Other (See Comments)   Blisters on hands   Chocolate Other (See Comments)   migraines   Venlafaxine Other (See Comments)   "refuses to take - makes me feel like I am dying"   Lisinopril Cough        Medication List        Accurate as of August 01, 2022  3:32 PM. If you have any questions, ask your nurse or doctor.          acetaminophen 500 MG tablet Commonly known as: TYLENOL Take 2 tablets (1,000 mg total) by mouth every 8 (eight)  hours.   albuterol 108 (90 Base) MCG/ACT inhaler Commonly known as: VENTOLIN HFA TAKE 2 PUFFS BY MOUTH EVERY 6 HOURS AS NEEDED FOR WHEEZE OR SHORTNESS OF BREATH   amoxicillin 875 MG tablet Commonly known as: AMOXIL Take 1 tablet (875 mg total) by mouth 2 (two) times daily for 10 days. Started by: August 03, 2022, MD   famotidine 20 MG tablet Commonly known as: PEPCID Take 1 tablet (20 mg total) by mouth 2 (two) times daily.   fluocinonide cream 0.05 % Commonly known as: LIDEX Apply 1 application topically 2 (two) times daily.   fluticasone 50 MCG/ACT nasal spray Commonly known as: FLONASE Place 1 spray  into both nostrils daily.   gabapentin 300 MG capsule Commonly known as: NEURONTIN Take 1 capsule by mouth 2 (two) times daily.   levothyroxine 100 MCG tablet Commonly known as: SYNTHROID TAKE 1 TABLET BY MOUTH EVERY DAY BEFORE BREAKFAST   losartan 50 MG tablet Commonly known as: COZAAR Take 1 tablet (50 mg total) by mouth daily.   methocarbamol 500 MG tablet Commonly known as: ROBAXIN Take 500 mg by mouth every 6 (six) hours as needed for muscle spasms.   metroNIDAZOLE 0.75 % gel Commonly known as: METROGEL Apply topically daily. What changed: when to take this Changed by: [name], MD   montelukast 10 MG tablet Commonly known as: SINGULAIR TAKE 1 TABLET BY MOUTH EVERYDAY AT BEDTIME   nabumetone 500 MG tablet Commonly known as: RELAFEN TAKE 2 TABLETS (1,000 MG TOTAL) BY MOUTH 2 (TWO) TIMES DAILY. FOR MUSCLE AND JOINT PAIN   omeprazole 40 MG capsule Commonly known as: PRILOSEC TAKE 1 CAPSULE BY MOUTH EVERY DAY   promethazine 25 MG tablet Commonly known as: PHENERGAN TAKE 1 TABLET BY MOUTH EVERY 6 HOURS AS NEEDED FOR NAUSEA AND VOMITING   rosuvastatin 40 MG tablet Commonly known as: Crestor Take 1 tablet (40 mg total) by mouth daily. For cholesterol   sertraline 50 MG tablet Commonly known as: ZOLOFT TAKE 1 TABLET BY MOUTH EVERYDAY AT BEDTIME    traZODone 100 MG tablet Commonly known as: DESYREL TAKE 3 TABLETS (300MG ) BY MOUTH EVERY DAY AT BEDTIME         Follow-up: No follow-ups on file.  , M.D.

## 2022-08-02 LAB — CBC WITH DIFFERENTIAL/PLATELET
Basophils Absolute: 0 10*3/uL (ref 0.0–0.2)
Basos: 1 %
EOS (ABSOLUTE): 0.2 10*3/uL (ref 0.0–0.4)
Eos: 3 %
Hematocrit: 41.5 % (ref 34.0–46.6)
Hemoglobin: 13.5 g/dL (ref 11.1–15.9)
Immature Grans (Abs): 0 10*3/uL (ref 0.0–0.1)
Immature Granulocytes: 0 %
Lymphocytes Absolute: 1.7 10*3/uL (ref 0.7–3.1)
Lymphs: 22 %
MCH: 28.5 pg (ref 26.6–33.0)
MCHC: 32.5 g/dL (ref 31.5–35.7)
MCV: 88 fL (ref 79–97)
Monocytes Absolute: 0.4 10*3/uL (ref 0.1–0.9)
Monocytes: 5 %
Neutrophils Absolute: 5.3 10*3/uL (ref 1.4–7.0)
Neutrophils: 69 %
Platelets: 205 10*3/uL (ref 150–450)
RBC: 4.73 x10E6/uL (ref 3.77–5.28)
RDW: 14.8 % (ref 11.7–15.4)
WBC: 7.7 10*3/uL (ref 3.4–10.8)

## 2022-08-02 LAB — CMP14+EGFR
ALT: 22 IU/L (ref 0–32)
AST: 28 IU/L (ref 0–40)
Albumin/Globulin Ratio: 1.9 (ref 1.2–2.2)
Albumin: 4.7 g/dL (ref 3.8–4.9)
Alkaline Phosphatase: 133 IU/L — ABNORMAL HIGH (ref 44–121)
BUN/Creatinine Ratio: 15 (ref 9–23)
BUN: 17 mg/dL (ref 6–24)
Bilirubin Total: 0.4 mg/dL (ref 0.0–1.2)
CO2: 24 mmol/L (ref 20–29)
Calcium: 9.6 mg/dL (ref 8.7–10.2)
Chloride: 100 mmol/L (ref 96–106)
Creatinine, Ser: 1.11 mg/dL — ABNORMAL HIGH (ref 0.57–1.00)
Globulin, Total: 2.5 g/dL (ref 1.5–4.5)
Glucose: 173 mg/dL — ABNORMAL HIGH (ref 70–99)
Potassium: 4.3 mmol/L (ref 3.5–5.2)
Sodium: 141 mmol/L (ref 134–144)
Total Protein: 7.2 g/dL (ref 6.0–8.5)
eGFR: 57 mL/min/{1.73_m2} — ABNORMAL LOW (ref >59)

## 2022-08-02 LAB — LIPID PANEL
Chol/HDL Ratio: 4.2 ratio (ref 0.0–4.4)
Cholesterol, Total: 143 mg/dL (ref 100–199)
HDL: 34 mg/dL — ABNORMAL LOW (ref >39)
LDL Chol Calc (NIH): 73 mg/dL (ref 0–99)
Triglycerides: 215 mg/dL — ABNORMAL HIGH (ref 0–149)
VLDL Cholesterol Cal: 36 mg/dL (ref 5–40)

## 2022-08-02 LAB — TSH+FREE T4
Free T4: 1.19 ng/dL (ref 0.82–1.77)
TSH: 2.85 u[IU]/mL (ref 0.450–4.500)

## 2022-08-02 NOTE — Progress Notes (Signed)
Hello Joella,  Your lab result is normal and/or stable.Some minor variations that are not significant are commonly marked abnormal, but do not represent any medical problem for you.  Best regards, Theophile Harvie, M.D.

## 2022-08-14 ENCOUNTER — Other Ambulatory Visit: Payer: Self-pay | Admitting: Family Medicine

## 2022-08-21 ENCOUNTER — Other Ambulatory Visit: Payer: Self-pay | Admitting: Family Medicine

## 2022-08-21 DIAGNOSIS — R110 Nausea: Secondary | ICD-10-CM

## 2022-10-04 ENCOUNTER — Other Ambulatory Visit: Payer: Self-pay | Admitting: Family Medicine

## 2022-10-04 DIAGNOSIS — R110 Nausea: Secondary | ICD-10-CM

## 2022-11-14 NOTE — Progress Notes (Signed)
Synopsis: Referred for cough, dyspnea, possible history of asthma by [name], MD  Subjective:   PATIENT ID: [name] GENDER: female DOB: [birth year], MRN: [id number]  Chief Complaint  Patient presents with   Follow-up    Breathing has been worse over the past 6 months, esp worse x 3 wks. She is winded walking room to room. She has been coughing- prod with green/yellow sputum, occ blood tinged.    [name] with history of GERD on ppi nightly, OSA (not supported by 2013 sleep study but she weighed ~270 lb at the time), asthma dx as a child never has had PFTs, never smoker, who is seen for cough, dyspnea.  Seen by PCP 8/5. Maintained on singulair, trelegy and started on prednisone taper, levaquin. She doesn't note much difference with trelegy as opposed to breo.   She says she has DOE that's been on and off, but lately it has been progressive over last 3 months. DOE to 50 ft currently. She has some accompanying chest pain that is worse with coughing. Pleuritic after a bad coughing spell. Had bronchitis about 3 weeks ago given above tx, she feels tessalon perles were probably more helpful than prednisone. She never took the levaquin. She doesn't note any worsening orthopnea - she always feels smothered when she lies on her back. Some worsening swelling in R>L foot.   Smoked very little when she was in middle school. She works for [employer] in [place] division. 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 She has lived in [place] for a period. She has a cat. No pet bird or hot tub.  No first degree relatives with lung disease.   Interval HPI:  Worsening DOE over last 6 months. Winded with minimal exertion. She does feel CP that is mild that is central when she exerts herself and it is relieved when she stops. She does not however feel like it's angina.   Frequent productive cough.   She gets partial relief from albuterol.   Very sleepy during  the day. Snoring. Occasional PND.   Otherwise pertinent review of systems is negative.  Past Medical History:  Diagnosis Date   Anemia    hx of with pregnancy    Anxiety    ARF (acute renal failure) 07/16/2019   Arthritis    Asthma    Colon polyps    Depression    GERD (gastroesophageal reflux disease)    Headache    occasional headaches    High cholesterol    Hypertension    Lithium toxicity 06/2019   has since resolved after discontinuing    PONV (postoperative nausea and vomiting)    Shortness of breath dyspnea    due to weight gain    Sleep apnea    has not use CPAP machine in 2 yrs/ DOES NOT KNOW IF SHE NEEDS TO USE C - PAP   Syncope and collapse    due to severe knee pain      Family History  Problem Relation Age of Onset   Alcohol abuse Mother    Arthritis Mother    Heart disease Mother    Hyperlipidemia Father    Hypertension Father    Heart disease Father 11       CABG   Gout Father    Hypertension Sister    Cancer Daughter        NEUROBLASTOMA   Breast cancer Maternal Grandmother    Cancer  Maternal Grandmother        breast   Cancer Brother        colon, lung, liver     Past Surgical History:  Procedure Laterality Date   BACK SURGERY  2012   lumb fusion   CARPAL TUNNEL RELEASE  2007   both hands   COLONOSCOPY     ENDOMETRIAL ABLATION W/ NOVASURE     EXCISION METACARPAL MASS Right 03/10/2014   Procedure: EXCISION MASS RIGHT HAND FIRST WEB SPACE DORSAL PALMAR INCISION ;  Surgeon: Cindee Salt, MD;  Location: [facility];  Service: Orthopedics;  Laterality: Right;   EXCISIONAL TOTAL KNEE ARTHROPLASTY WITH ANTIBIOTIC SPACERS Left 09/19/2019   Procedure: EXCISIONAL  LEFT TOTAL KNEE ARTHROPLASTY WITH TOTAL KNEE REVISION;  Surgeon: Durene Romans, MD;  Location: [facility];  Service: Orthopedics;  Laterality: Left;  2 hrs   IRRIGATION AND DEBRIDEMENT KNEE Left 05/23/2014   Procedure: IRRIGATION AND DEBRIDEMENT KNEE;  Surgeon: [name], MD;   Location: [facility];  Service: Orthopedics;  Laterality: Left;  With POLYETHYLENE EXCHANGE   KNEE ARTHROSCOPY     let knee 03/2013    KNEE ARTHROSCOPY WITH MEDIAL MENISECTOMY Right 10/22/2013   Procedure: RIGHT KNEE ARTHROSCOPY WITH MEDIAL MENISECTOMY, abrasion chrondrplasty Brigid Re OUT/CORRECT CURETTEMENT/BONE GRAFT/PROXIMAL TIBIA ;  Surgeon: Jacki Cones, MD;  Location: [facility];  Service: Orthopedics;  Laterality: Right;   TOTAL KNEE ARTHROPLASTY Left 04/16/2014   Procedure: LEFT TOTAL KNEE ARTHROPLASTY;  Surgeon: Jacki Cones, MD;  Location: [facility];  Service: Orthopedics;  Laterality: Left;   TOTAL KNEE ARTHROPLASTY Right 12/23/2014   Procedure: TOTAL KNEE ARTHROPLASTY;  Surgeon: Ranee Gosselin, MD;  Location: [facility];  Service: Orthopedics;  Laterality: Right;   trigger finger release surgeyr      bilateral    TUBAL LIGATION      Social History   Socioeconomic History   Marital status: Married    Spouse name: Trey Paula   Number of children: 2   Years of education: 12   Highest education level: Some college, no degree  Occupational History    Comment: Disabled  Tobacco Use   Smoking status: Never   Smokeless tobacco: Never  Vaping Use   Vaping Use: Never used  Substance and Sexual Activity   Alcohol use: No   Drug use: No   Sexual activity: Not on file  Other Topics Concern   Not on file  Social History Narrative   Lives with her husband who helps with grocery shopping and other transportation needs.     Has two children, Billey Gosling and Clydie Braun and grandchildren - they all live nearby.   There are two outside dogs and several  Horses.    Social Determinants of Health   Financial Resource Strain: Low Risk  (07/14/2022)   Overall Financial Resource Strain (CARDIA)    Difficulty of Paying Living Expenses: Not hard at all  Food Insecurity: No Food Insecurity (07/14/2022)   Hunger Vital Sign    Worried About Running Out of Food in the Last Year: Never true    Ran Out of Food in the  Last Year: Never true  Transportation Needs: No Transportation Needs (07/13/2021)   PRAPARE - Administrator, Civil Service (Medical): No    Lack of Transportation (Non-Medical): No  Physical Activity: Inactive (07/14/2022)   Exercise Vital Sign    Days of Exercise per Week: 0 days    Minutes of Exercise per Session: 0 min  Stress: No Stress Concern Present (07/14/2022)   [employer] of Occupational Health - Occupational Stress Questionnaire    Feeling of Stress : Not at all  Social Connections: Moderately Integrated (07/14/2022)   Social Connection and Isolation Panel [NHANES]    Frequency of Communication with Friends and Family: More than three times a week    Frequency of Social Gatherings with Friends and Family: More than three times a week    Attends Religious Services: More than 4 times per year    Active Member of [employer] or Organizations: No    Attends Banker Meetings: Never    Marital Status: Married  Catering manager Violence: Not At Risk (07/14/2022)   Humiliation, Afraid, Rape, and Kick questionnaire    Fear of Current or Ex-Partner: No    Emotionally Abused: No    Physically Abused: No    Sexually Abused: No     Allergies  Allergen Reactions   Chocolate Flavor     Other reaction(s): headache   Other Itching and Rash    Other reaction(s): other   Adhesive [Tape] Other (See Comments)    Peeled off skin   Ceclor [Cefaclor] Other (See Comments)    Blisters on hands   Chocolate Other (See Comments)    migraines   Venlafaxine Other (See Comments)    "refuses to take - makes me feel like I am dying"   Lisinopril Cough     Outpatient Medications Prior to Visit  Medication Sig Dispense Refill   fluocinonide cream (LIDEX) 0.05 % Apply 1 application topically 2 (two) times daily.     gabapentin (NEURONTIN) 300 MG capsule Take 1 capsule by mouth 2 (two) times daily.     levothyroxine (SYNTHROID) 100 MCG tablet TAKE 1 TABLET BY MOUTH  EVERY DAY BEFORE BREAKFAST 90 tablet 3   losartan (COZAAR) 50 MG tablet Take 1 tablet (50 mg total) by mouth daily. 90 tablet 3   methocarbamol (ROBAXIN) 500 MG tablet Take 500 mg by mouth every 6 (six) hours as needed for muscle spasms.     metroNIDAZOLE (METROGEL) 0.75 % gel Apply topically daily. 45 g 5   montelukast (SINGULAIR) 10 MG tablet TAKE 1 TABLET BY MOUTH EVERYDAY AT BEDTIME 90 tablet 1   nabumetone (RELAFEN) 500 MG tablet TAKE 2 TABLETS (1,000 MG TOTAL) BY MOUTH 2 (TWO) TIMES DAILY. FOR MUSCLE AND JOINT PAIN 360 tablet 1   omeprazole (PRILOSEC) 40 MG capsule TAKE 1 CAPSULE BY MOUTH EVERY DAY 90 capsule 3   promethazine (PHENERGAN) 25 MG tablet TAKE 1 TABLET BY MOUTH EVERY 6 HOURS AS NEEDED FOR NAUSEA AND VOMITING 90 tablet 0   rosuvastatin (CRESTOR) 40 MG tablet Take 1 tablet (40 mg total) by mouth daily. For cholesterol 90 tablet 3   sertraline (ZOLOFT) 50 MG tablet TAKE 1 TABLET BY MOUTH EVERYDAY AT BEDTIME 90 tablet 3   traZODone (DESYREL) 100 MG tablet TAKE 3 TABLETS (300MG ) BY MOUTH EVERY DAY AT BEDTIME 270 tablet 3   albuterol (VENTOLIN HFA) 108 (90 Base) MCG/ACT inhaler TAKE 2 PUFFS BY MOUTH EVERY 6 HOURS AS NEEDED FOR WHEEZE OR SHORTNESS OF BREATH 18 each 2   acetaminophen (TYLENOL) 500 MG tablet Take 2 tablets (1,000 mg total) by mouth every 8 (eight) hours. 30 tablet 0   famotidine (PEPCID) 20 MG tablet Take 1 tablet (20 mg total) by mouth 2 (two) times daily. 60 tablet 3   fluticasone (FLONASE) 50 MCG/ACT nasal spray Place 1 spray into both  nostrils daily. 16 g 2   No facility-administered medications prior to visit.       Objective:   Physical Exam:  General appearance: 62 y.o., female, NAD, conversant  Eyes: anicteric sclerae; PERRL, tracking appropriately HENT: NCAT; MMM Neck: Trachea midline; no lymphadenopathy, no JVD Lungs: CTAB, no crackles, no wheeze, with normal respiratory effort CV: RRR, no murmur  Abdomen: Soft, non-tender; non-distended, BS present   Extremities: No peripheral edema, warm Skin: Normal turgor and texture; no rash Psych: Appropriate affect Neuro: Alert and oriented to [name] and place, no focal deficit     Vitals:   11/15/22 1535  BP: (!) 148/84  Pulse: 75  Temp: 97.7 F (36.5 C)  TempSrc: Oral  SpO2: 91%  Weight: (!) 352 lb (159.7 kg)  Height: 5\' 4"  (1.626 m)     91% on RA BMI Readings from Last 3 Encounters:  11/15/22 60.42 kg/m  08/01/22 59.39 kg/m  07/14/22 56.64 kg/m   Wt Readings from Last 3 Encounters:  11/15/22 (!) 352 lb (159.7 kg)  08/01/22 (!) 346 lb (156.9 kg)  07/14/22 (!) 330 lb (149.7 kg)     CBC    Component Value Date/Time   WBC 7.7 08/01/2022 1535   WBC 13.5 (H) 09/20/2019 0231   RBC 4.73 08/01/2022 1535   RBC 3.36 (L) 09/20/2019 0231   HGB 13.5 08/01/2022 1535   HCT 41.5 08/01/2022 1535   PLT 205 08/01/2022 1535   MCV 88 08/01/2022 1535   MCH 28.5 08/01/2022 1535   MCH 30.4 09/20/2019 0231   MCHC 32.5 08/01/2022 1535   MCHC 32.2 09/20/2019 0231   RDW 14.8 08/01/2022 1535   LYMPHSABS 1.7 08/01/2022 1535   MONOABS 0.6 09/11/2019 1143   EOSABS 0.2 08/01/2022 1535   BASOSABS 0.0 08/01/2022 1535    Low level eosinophilia historically  Chest Imaging: CTA Chest 01/2019 reviewed by me and remarkable for 8mm nodule near minor fissure, no LAD, some likely dependent atelectasis.   CXR 12/2019 reviewed by me and unremarkable  06/2019 NM perfusion: no perfusion defect  Pulmonary Functions Testing Results:    Latest Ref Rng & Units 04/01/2021   11:33 AM  PFT Results  FVC-Pre L 2.54   FVC-Predicted Pre % 72   FVC-Post L 2.50   FVC-Predicted Post % 71   Pre FEV1/FVC % % 86   Post FEV1/FCV % % 90   FEV1-Pre L 2.18   FEV1-Predicted Pre % 79   FEV1-Post L 2.25   DLCO uncorrected ml/min/mmHg 19.46   DLCO UNC% % 91   DLCO corrected ml/min/mmHg 20.40   DLCO COR %Predicted % 96   DLVA Predicted % 127   TLC L 4.53   TLC % Predicted % 87   RV % Predicted % 72      PFTs reviewed by me showing nonspecific ventilatory defect, equivocal results for air trapping and elevated DLCO for alveolar volume  Echocardiogram:   TTE 2021: mildly dilated LAD, LV hypertrophy, normal EF.  TTE 2014: G1DD  [facility] stress 2014 report essentially normal       Assessment & Plan:   # Dyspnea on exertion: # Exertional CP: Could well be multifactorial. May have deconditioning and prior TTEs suggestive of diastolic dysfunction but she looks euvolemic today essentially. Asthma possible but less likely based on recent PFT. She thinks her chest pain/heaviness with exertion is simply weight related but could alternatively be angina.   # Chronic cough: PND and gerd are probably still not fully  addressed for her. Needs to restart flonase. Perhaps intolerant of ppi (says she had nausea) and does not take consistently.   # Excessive daytime sleepiness: # Snoring: High risk for sleep disordered breathing   Plan: - labs and x ray today - you'll be called to schedule sleep study - try breztri 2 puff twice daily, stop it 4 weeks before PFT - would stay on singulair 10 mg daily for now - PFTs on same day as clinic visit in 8 weeks - if CP with exertion unchanged and not responsive to breztri/albuterol, refer to cardiology for workup of possible angina      Morgan Person, MD [facility] 11/15/2022 3:41 PM

## 2022-11-15 ENCOUNTER — Ambulatory Visit: Payer: Medicare HMO | Admitting: Student

## 2022-11-15 ENCOUNTER — Ambulatory Visit (INDEPENDENT_AMBULATORY_CARE_PROVIDER_SITE_OTHER): Payer: Medicare HMO

## 2022-11-15 ENCOUNTER — Encounter: Payer: Self-pay | Admitting: Student

## 2022-11-15 VITALS — BP 148/84 | HR 75 | Temp 97.7°F | Ht 64.0 in | Wt 352.0 lb

## 2022-11-15 DIAGNOSIS — G4719 Other hypersomnia: Secondary | ICD-10-CM | POA: Diagnosis not present

## 2022-11-15 DIAGNOSIS — R0609 Other forms of dyspnea: Secondary | ICD-10-CM

## 2022-11-15 DIAGNOSIS — R053 Chronic cough: Secondary | ICD-10-CM | POA: Diagnosis not present

## 2022-11-15 DIAGNOSIS — J811 Chronic pulmonary edema: Secondary | ICD-10-CM | POA: Diagnosis not present

## 2022-11-15 DIAGNOSIS — R0600 Dyspnea, unspecified: Secondary | ICD-10-CM | POA: Diagnosis not present

## 2022-11-15 LAB — D-DIMER, QUANTITATIVE: D-Dimer, Quant: 0.59 mcg/mL FEU — ABNORMAL HIGH (ref <0.50)

## 2022-11-15 MED ORDER — BREZTRI AEROSPHERE 160-9-4.8 MCG/ACT IN AERO: 2.0000 | INHALATION_SPRAY | Freq: Two times a day (BID) | RESPIRATORY_TRACT | 0 refills | Status: DC

## 2022-11-15 MED ORDER — ALBUTEROL SULFATE HFA 108 (90 BASE) MCG/ACT IN AERS: 1.0000 | INHALATION_SPRAY | Freq: Four times a day (QID) | RESPIRATORY_TRACT | 11 refills | Status: DC | PRN

## 2022-11-15 NOTE — Patient Instructions (Addendum)
-   labs and x ray today - you'll be called to schedule sleep study - try breztri 2 puff twice daily, stop it 4 weeks before PFT - would stay on singulair 10 mg daily for now - PFTs on same day as clinic visit in 8 weeks

## 2022-11-15 NOTE — Addendum Note (Signed)
Addended by: Christen Butter on: 11/15/2022 04:18 PM   Modules accepted: Orders

## 2022-11-16 LAB — CBC WITH DIFFERENTIAL/PLATELET
Basophils Absolute: 0.1 10*3/uL (ref 0.0–0.1)
Basophils Relative: 1 % (ref 0.0–3.0)
Eosinophils Absolute: 0.2 10*3/uL (ref 0.0–0.7)
Eosinophils Relative: 2.9 % (ref 0.0–5.0)
HCT: 39.5 % (ref 36.0–46.0)
Hemoglobin: 13.2 g/dL (ref 12.0–15.0)
Lymphocytes Relative: 19.5 % (ref 12.0–46.0)
Lymphs Abs: 1.5 10*3/uL (ref 0.7–4.0)
MCHC: 33.5 g/dL (ref 30.0–36.0)
MCV: 91.6 fl (ref 78.0–100.0)
Monocytes Absolute: 0.5 10*3/uL (ref 0.1–1.0)
Monocytes Relative: 7 % (ref 3.0–12.0)
Neutro Abs: 5.2 10*3/uL (ref 1.4–7.7)
Neutrophils Relative %: 69.6 % (ref 43.0–77.0)
Platelets: 201 10*3/uL (ref 150.0–400.0)
RBC: 4.32 Mil/uL (ref 3.87–5.11)
RDW: 15.6 % — ABNORMAL HIGH (ref 11.5–15.5)
WBC: 7.5 10*3/uL (ref 4.0–10.5)

## 2022-11-16 LAB — BRAIN NATRIURETIC PEPTIDE: Pro B Natriuretic peptide (BNP): 40 pg/mL (ref 0.0–100.0)

## 2022-11-18 ENCOUNTER — Other Ambulatory Visit: Payer: Self-pay | Admitting: Family Medicine

## 2022-11-18 DIAGNOSIS — R110 Nausea: Secondary | ICD-10-CM

## 2022-12-22 ENCOUNTER — Other Ambulatory Visit: Payer: Self-pay | Admitting: Family Medicine

## 2022-12-23 ENCOUNTER — Ambulatory Visit (INDEPENDENT_AMBULATORY_CARE_PROVIDER_SITE_OTHER): Payer: Medicare HMO | Admitting: Family Medicine

## 2022-12-23 ENCOUNTER — Encounter: Payer: Self-pay | Admitting: Family Medicine

## 2022-12-23 VITALS — BP 133/74 | HR 83 | Temp 98.3°F | Resp 20 | Ht 64.0 in | Wt 358.0 lb

## 2022-12-23 DIAGNOSIS — M7989 Other specified soft tissue disorders: Secondary | ICD-10-CM

## 2022-12-23 DIAGNOSIS — E039 Hypothyroidism, unspecified: Secondary | ICD-10-CM | POA: Diagnosis not present

## 2022-12-23 DIAGNOSIS — R6889 Other general symptoms and signs: Secondary | ICD-10-CM | POA: Diagnosis not present

## 2022-12-23 LAB — CBC WITH DIFFERENTIAL/PLATELET
Eos: 5 %
Hemoglobin: 12.8 g/dL (ref 11.1–15.9)
Lymphs: 25 %
RDW: 13.9 % (ref 11.7–15.4)
WBC: 6 10*3/uL (ref 3.4–10.8)

## 2022-12-23 LAB — BMP8+EGFR

## 2022-12-23 MED ORDER — FUROSEMIDE 40 MG PO TABS
40.0000 mg | ORAL_TABLET | Freq: Every day | ORAL | 3 refills | Status: DC
Start: 2022-12-23 — End: 2023-02-20

## 2022-12-23 NOTE — Progress Notes (Signed)
Subjective:  Patient ID: [name], female    DOB: 18-Apr-1961, 62 y.o.   MRN: [id number]  Patient Care Team: [name], MD as PCP - General (Family Medicine) Cottle, Steva Ready., MD as Attending Physician (Psychiatry) Ranee Gosselin, MD as Consulting Physician (Orthopedic Surgery)   Chief Complaint:  bilateral feet swelling (Painful at times/)   HPI: [name] is a 62 y.o. female presenting on 12/23/2022 for bilateral feet swelling (Painful at times/)   Pt presents today for evaluation of bilateral feet swelling. States has been ongoing for several weeks but has worsened over the last 2 weeks. She does report minimal shortness of breath with exertion but this is not new or worsening, she is seeing pulmonology. She denies PND, orthopnea, chest pain, palpitations, fatigue, weakness, or syncope. She does admit to excessive sodium intake and a sedentary lifestyle. She does not wear compression hose or prop feet when sitting.       Relevant past medical, surgical, family, and social history reviewed and updated as indicated.  Allergies and medications reviewed and updated. Data reviewed: Chart in Epic.   Past Medical History:  Diagnosis Date   Anemia    hx of with pregnancy    Anxiety    ARF (acute renal failure) (HCC) 07/16/2019   Arthritis    Asthma    Colon polyps    Depression    GERD (gastroesophageal reflux disease)    Headache    occasional headaches    High cholesterol    Hypertension    Lithium toxicity 06/2019   has since resolved after discontinuing    PONV (postoperative nausea and vomiting)    Shortness of breath dyspnea    due to weight gain    Sleep apnea    has not use CPAP machine in 2 yrs/ DOES NOT KNOW IF SHE NEEDS TO USE C - PAP   Syncope and collapse    due to severe knee pain     Past Surgical History:  Procedure Laterality Date   BACK SURGERY  2012   lumb fusion   CARPAL TUNNEL RELEASE  2007   both hands   COLONOSCOPY      ENDOMETRIAL ABLATION W/ NOVASURE     EXCISION METACARPAL MASS Right 03/10/2014   Procedure: EXCISION MASS RIGHT HAND FIRST WEB SPACE DORSAL PALMAR INCISION ;  Surgeon: Cindee Salt, MD;  Location: [facility];  Service: Orthopedics;  Laterality: Right;   EXCISIONAL TOTAL KNEE ARTHROPLASTY WITH ANTIBIOTIC SPACERS Left 09/19/2019   Procedure: EXCISIONAL  LEFT TOTAL KNEE ARTHROPLASTY WITH TOTAL KNEE REVISION;  Surgeon: Durene Romans, MD;  Location: [facility];  Service: Orthopedics;  Laterality: Left;  2 hrs   IRRIGATION AND DEBRIDEMENT KNEE Left 05/23/2014   Procedure: IRRIGATION AND DEBRIDEMENT KNEE;  Surgeon: [name], MD;  Location: [facility];  Service: Orthopedics;  Laterality: Left;  With POLYETHYLENE EXCHANGE   KNEE ARTHROSCOPY     let knee 03/2013    KNEE ARTHROSCOPY WITH MEDIAL MENISECTOMY Right 10/22/2013   Procedure: RIGHT KNEE ARTHROSCOPY WITH MEDIAL MENISECTOMY, abrasion chrondrplasty Brigid Re OUT/CORRECT CURETTEMENT/BONE GRAFT/PROXIMAL TIBIA ;  Surgeon: Jacki Cones, MD;  Location: [facility];  Service: Orthopedics;  Laterality: Right;   TOTAL KNEE ARTHROPLASTY Left 04/16/2014   Procedure: LEFT TOTAL KNEE ARTHROPLASTY;  Surgeon: Jacki Cones, MD;  Location: [facility];  Service: Orthopedics;  Laterality: Left;   TOTAL KNEE ARTHROPLASTY Right 12/23/2014   Procedure: TOTAL KNEE ARTHROPLASTY;  Surgeon: Ranee Gosselin, MD;  Location: [facility];  Service: Orthopedics;  Laterality: Right;   trigger finger release surgeyr      bilateral    TUBAL LIGATION      Social History   Socioeconomic History   Marital status: Married    Spouse name: Trey Paula   Number of children: 2   Years of education: 12   Highest education level: Some college, no degree  Occupational History    Comment: Disabled  Tobacco Use   Smoking status: Never   Smokeless tobacco: Never  Vaping Use   Vaping Use: Never used  Substance and Sexual Activity   Alcohol use: No   Drug use: No   Sexual activity: Not on  file  Other Topics Concern   Not on file  Social History Narrative   Lives with her husband who helps with grocery shopping and other transportation needs.     Has two children, Billey Gosling and Clydie Braun and grandchildren - they all live nearby.   There are two outside dogs and several  Horses.    Social Determinants of Health   Financial Resource Strain: Low Risk  (12/22/2022)   Overall Financial Resource Strain (CARDIA)    Difficulty of Paying Living Expenses: Not very hard  Food Insecurity: No Food Insecurity (12/22/2022)   Hunger Vital Sign    Worried About Running Out of Food in the Last Year: Never true    Ran Out of Food in the Last Year: Never true  Transportation Needs: No Transportation Needs (12/22/2022)   PRAPARE - Administrator, Civil Service (Medical): No    Lack of Transportation (Non-Medical): No  Physical Activity: Inactive (12/22/2022)   Exercise Vital Sign    Days of Exercise per Week: 0 days    Minutes of Exercise per Session: 0 min  Stress: No Stress Concern Present (12/22/2022)   [employer] of Occupational Health - Occupational Stress Questionnaire    Feeling of Stress : Only a little  Social Connections: Moderately Integrated (12/22/2022)   Social Connection and Isolation Panel [NHANES]    Frequency of Communication with Friends and Family: More than three times a week    Frequency of Social Gatherings with Friends and Family: Twice a week    Attends Religious Services: More than 4 times per year    Active Member of [employer] or Organizations: No    Attends Banker Meetings: Never    Marital Status: Married  Catering manager Violence: Not At Risk (07/14/2022)   Humiliation, Afraid, Rape, and Kick questionnaire    Fear of Current or Ex-Partner: No    Emotionally Abused: No    Physically Abused: No    Sexually Abused: No    Outpatient Encounter Medications as of 12/23/2022  Medication Sig   albuterol (VENTOLIN HFA) 108 (90 Base) MCG/ACT  inhaler Inhale 1-2 puffs into the lungs every 6 (six) hours as needed for wheezing or shortness of breath.   fluocinonide cream (LIDEX) 0.05 % Apply 1 application topically 2 (two) times daily.   furosemide (LASIX) 40 MG tablet Take 1 tablet (40 mg total) by mouth daily.   gabapentin (NEURONTIN) 300 MG capsule Take 1 capsule by mouth 2 (two) times daily.   levothyroxine (SYNTHROID) 100 MCG tablet TAKE 1 TABLET BY MOUTH EVERY DAY BEFORE BREAKFAST   losartan (COZAAR) 50 MG tablet Take 1 tablet (50 mg total) by mouth daily.   methocarbamol (ROBAXIN) 500 MG tablet Take 500 mg by mouth every  6 (six) hours as needed for muscle spasms.   metroNIDAZOLE (METROGEL) 0.75 % gel Apply topically daily.   montelukast (SINGULAIR) 10 MG tablet TAKE 1 TABLET BY MOUTH EVERYDAY AT BEDTIME   nabumetone (RELAFEN) 500 MG tablet TAKE 2 TABLETS (1,000 MG TOTAL) BY MOUTH 2 (TWO) TIMES DAILY. FOR MUSCLE AND JOINT PAIN   omeprazole (PRILOSEC) 40 MG capsule TAKE 1 CAPSULE BY MOUTH EVERY DAY   promethazine (PHENERGAN) 25 MG tablet TAKE 1 TABLET BY MOUTH EVERY 6 HOURS AS NEEDED FOR NAUSEA AND VOMITING   rosuvastatin (CRESTOR) 40 MG tablet Take 1 tablet (40 mg total) by mouth daily. For cholesterol   sertraline (ZOLOFT) 50 MG tablet TAKE 1 TABLET BY MOUTH EVERYDAY AT BEDTIME   traZODone (DESYREL) 100 MG tablet TAKE 3 TABLETS (300MG ) BY MOUTH EVERY DAY AT BEDTIME   Budeson-Glycopyrrol-Formoterol (BREZTRI AEROSPHERE) 160-9-4.8 MCG/ACT AERO Inhale 2 puffs into the lungs in the morning and at bedtime. (Patient not taking: Reported on 12/23/2022)   No facility-administered encounter medications on file as of 12/23/2022.    Allergies  Allergen Reactions   Chocolate Flavor     Other reaction(s): headache   Other Itching and Rash    Other reaction(s): other   Adhesive [Tape] Other (See Comments)    Peeled off skin   Ceclor [Cefaclor] Other (See Comments)    Blisters on hands   Chocolate Other (See Comments)    migraines    Venlafaxine Other (See Comments)    "refuses to take - makes me feel like I am dying"   Lisinopril Cough    Review of Systems  Constitutional:  Negative for activity change, appetite change, chills, diaphoresis, fatigue, fever and unexpected weight change.  HENT: Negative.    Eyes: Negative.   Respiratory:  Positive for shortness of breath (minimal with exertion, does have underlying lung disease, not new or worsening.). Negative for apnea, cough, choking, chest tightness, wheezing and stridor.   Cardiovascular:  Positive for leg swelling. Negative for chest pain and palpitations.  Gastrointestinal:  Negative for abdominal pain, blood in stool, constipation, diarrhea, nausea and vomiting.  Endocrine: Negative.   Genitourinary:  Negative for decreased urine volume, difficulty urinating, dysuria, frequency and urgency.  Musculoskeletal:  Negative for arthralgias and myalgias.  Skin: Negative.   Allergic/Immunologic: Negative.   Neurological:  Negative for dizziness, tremors, seizures, syncope, facial asymmetry, speech difficulty, weakness, numbness and headaches.  Hematological: Negative.   Psychiatric/Behavioral:  Negative for confusion, hallucinations, sleep disturbance and suicidal ideas.   All other systems reviewed and are negative.       Objective:  BP 133/74   Pulse 83   Temp 98.3 F (36.8 C) (Temporal)   Resp 20   Ht 5\' 4"  (1.626 m)   Wt (!) 358 lb (162.4 kg)   SpO2 90%   BMI 61.45 kg/m    Wt Readings from Last 3 Encounters:  12/23/22 (!) 358 lb (162.4 kg)  11/15/22 (!) 352 lb (159.7 kg)  08/01/22 (!) 346 lb (156.9 kg)    Physical Exam Vitals and nursing note reviewed.  Constitutional:      General: She is not in acute distress.    Appearance: Normal appearance. She is well-developed and well-groomed. She is morbidly obese. She is not ill-appearing, toxic-appearing or diaphoretic.  HENT:     Head: Normocephalic and atraumatic.     Jaw: There is normal jaw  occlusion.     Right Ear: Hearing normal.     Left Ear: Hearing normal.  Nose: Nose normal.     Mouth/Throat:     Lips: Pink.     Mouth: Mucous membranes are moist.     Pharynx: Uvula midline.  Eyes:     General: Lids are normal.     Conjunctiva/sclera: Conjunctivae normal.     Pupils: Pupils are equal, round, and reactive to light.  Neck:     Thyroid: No thyroid mass, thyromegaly or thyroid tenderness.     Vascular: No carotid bruit or JVD.     Trachea: Trachea and phonation normal.  Cardiovascular:     Rate and Rhythm: Normal rate and regular rhythm.     Chest Wall: PMI is not displaced.     Pulses: Normal pulses.     Heart sounds: Normal heart sounds. No murmur heard.    No friction rub. No gallop.     Comments: 3+ edema to bilateral feet, 1+ to shins, nonpitting  Pulmonary:     Effort: Pulmonary effort is normal. No respiratory distress.     Breath sounds: Normal breath sounds. No wheezing.  Abdominal:     General: Bowel sounds are normal. There is no distension or abdominal bruit.     Palpations: Abdomen is soft. There is no hepatomegaly or splenomegaly.  Musculoskeletal:        General: Normal range of motion.     Cervical back: Normal range of motion and neck supple.     Right lower leg: No edema.     Left lower leg: No edema.  Lymphadenopathy:     Cervical: No cervical adenopathy.  Skin:    General: Skin is warm and dry.     Capillary Refill: Capillary refill takes less than 2 seconds.     Coloration: Skin is not cyanotic, jaundiced or pale.     Findings: No rash.  Neurological:     General: No focal deficit present.     Mental Status: She is alert and oriented to person, place, and time.     Sensory: Sensation is intact.     Motor: Motor function is intact.     Coordination: Coordination is intact.     Gait: Gait is intact.     Deep Tendon Reflexes: Reflexes are normal and symmetric.  Psychiatric:        Attention and Perception: Attention and perception  normal.        Mood and Affect: Mood and affect normal.        Speech: Speech normal.        Behavior: Behavior normal. Behavior is cooperative.        Thought Content: Thought content normal.        Cognition and Memory: Cognition and memory normal.        Judgment: Judgment normal.     Results for orders placed or performed in visit on 11/15/22  D-Dimer, Quantitative  Result Value Ref Range   D-Dimer, Quant 0.59 (H) <0.50 mcg/mL FEU  CBC w/Diff  Result Value Ref Range   WBC 7.5 4.0 - 10.5 K/uL   RBC 4.32 3.87 - 5.11 Mil/uL   Hemoglobin 13.2 12.0 - 15.0 g/dL   HCT 78.2 95.6 - 21.3 %   MCV 91.6 78.0 - 100.0 fl   MCHC 33.5 30.0 - 36.0 g/dL   RDW 08.6 (H) 57.8 - 46.9 %   Platelets 201.0 150.0 - 400.0 K/uL   Neutrophils Relative % 69.6 43.0 - 77.0 %   Lymphocytes Relative 19.5 12.0 - 46.0 %  Monocytes Relative 7.0 3.0 - 12.0 %   Eosinophils Relative 2.9 0.0 - 5.0 %   Basophils Relative 1.0 0.0 - 3.0 %   Neutro Abs 5.2 1.4 - 7.7 K/uL   Lymphs Abs 1.5 0.7 - 4.0 K/uL   Monocytes Absolute 0.5 0.1 - 1.0 K/uL   Eosinophils Absolute 0.2 0.0 - 0.7 K/uL   Basophils Absolute 0.1 0.0 - 0.1 K/uL  B Nat Peptide  Result Value Ref Range   Pro B Natriuretic peptide (BNP) 40.0 0.0 - 100.0 pg/mL       Pertinent labs & imaging results that were available during my care of the patient were reviewed by me and considered in my medical decision making.  Assessment & Plan:  [name] was seen today for bilateral feet swelling.  Diagnoses and all orders for this visit:  Bilateral swelling of feet Likely due to excessive sodium intake and lack of exercise. Will check labs for potential underlying causes such as thyroid dysfunction, anemia, fluid overload, or renal insufficiency. Will start lasix as prescribed. Symptomatic care and sodium restriction discussed in detail. No red flags concerning for heart failure. Pt aware of red flags which require emergent evaluation and treatment. Follow up with PCP  in 2 weeks for reevaluation, sooner if worsening.  -     BMP8+EGFR -     CBC with Differential/Platelet -     Thyroid Panel With TSH -     Brain natriuretic peptide -     furosemide (LASIX) 40 MG tablet; Take 1 tablet (40 mg total) by mouth daily.     Continue all other maintenance medications.  Follow up plan: Return in about 2 weeks (around 01/06/2023), or if symptoms worsen or fail to improve, for feet swelling, repeat BMP.   Continue healthy lifestyle choices, including diet (rich in fruits, vegetables, and lean proteins, and low in salt and simple carbohydrates) and exercise (at least 30 minutes of moderate physical activity daily).  Educational handout given for edema  The above assessment and management plan was discussed with the patient. The patient verbalized understanding of and has agreed to the management plan. Patient is aware to call the clinic if they develop any new symptoms or if symptoms persist or worsen. Patient is aware when to return to the clinic for a follow-up visit. Patient educated on when it is appropriate to go to the emergency department.   Kari Baars, FNP-C [facility] [phone]

## 2022-12-24 LAB — CBC WITH DIFFERENTIAL/PLATELET
Basophils Absolute: 0 10*3/uL (ref 0.0–0.2)
Basos: 1 %
EOS (ABSOLUTE): 0.3 10*3/uL (ref 0.0–0.4)
Hematocrit: 38.7 % (ref 34.0–46.6)
Immature Grans (Abs): 0 10*3/uL (ref 0.0–0.1)
Immature Granulocytes: 0 %
Lymphocytes Absolute: 1.5 10*3/uL (ref 0.7–3.1)
MCH: 31.3 pg (ref 26.6–33.0)
MCHC: 33.1 g/dL (ref 31.5–35.7)
MCV: 95 fL (ref 79–97)
Monocytes Absolute: 0.4 10*3/uL (ref 0.1–0.9)
Monocytes: 7 %
Neutrophils Absolute: 3.8 10*3/uL (ref 1.4–7.0)
Neutrophils: 62 %
Platelets: 165 10*3/uL (ref 150–450)
RBC: 4.09 x10E6/uL (ref 3.77–5.28)

## 2022-12-24 LAB — BMP8+EGFR
BUN: 18 mg/dL (ref 6–24)
CO2: 24 mmol/L (ref 20–29)
Calcium: 9.2 mg/dL (ref 8.7–10.2)
Creatinine, Ser: 1.18 mg/dL — ABNORMAL HIGH (ref 0.57–1.00)
Glucose: 87 mg/dL (ref 70–99)
Potassium: 5 mmol/L (ref 3.5–5.2)
eGFR: 53 mL/min/{1.73_m2} — ABNORMAL LOW (ref >59)

## 2022-12-24 LAB — THYROID PANEL WITH TSH
Free Thyroxine Index: 2.7 (ref 1.2–4.9)
T3 Uptake Ratio: 29 % (ref 24–39)
T4, Total: 9.2 ug/dL (ref 4.5–12.0)
TSH: 1.28 u[IU]/mL (ref 0.450–4.500)

## 2022-12-24 LAB — BRAIN NATRIURETIC PEPTIDE: BNP: 14.8 pg/mL (ref 0.0–100.0)

## 2023-01-09 ENCOUNTER — Ambulatory Visit: Payer: Medicare HMO | Admitting: Family Medicine

## 2023-01-13 ENCOUNTER — Other Ambulatory Visit: Payer: Self-pay | Admitting: Family Medicine

## 2023-01-15 NOTE — Progress Notes (Signed)
Synopsis: Referred for cough, dyspnea, possible history of asthma by [name], MD  Subjective:   PATIENT ID: [name] GENDER: female DOB: 24-Jun-1961, MRN: [id number]  No chief complaint on file.  62yF with history of GERD on ppi nightly, OSA (not supported by 2013 sleep study but she weighed ~270 lb at the time), asthma dx as a child never has had PFTs, never smoker, who is seen for cough, dyspnea.  Seen by PCP 8/5. Maintained on singulair, trelegy and started on prednisone taper, levaquin. She doesn't note much difference with trelegy as opposed to breo.   She says she has DOE that's been on and off, but lately it has been progressive over last 3 months. DOE to 50 ft currently. She has some accompanying chest pain that is worse with coughing. Pleuritic after a bad coughing spell. Had bronchitis about 3 weeks ago given above tx, she feels tessalon perles were probably more helpful than prednisone. She never took the levaquin. She doesn't note any worsening orthopnea - she always feels smothered when she lies on her back. Some worsening swelling in R>L foot.   Smoked very little when she was in middle school. She works for [employer] in [place] division. 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 She has lived in [place] for a period. She has a cat. No pet bird or hot tub.  No first degree relatives with lung disease.   Interval HPI:  Worsening DOE over last 6 months. Winded with minimal exertion. She does feel CP that is mild that is central when she exerts herself and it is relieved when she stops. She does not however feel like it's angina.   Frequent productive cough.   She gets partial relief from albuterol.   Very sleepy during the day. Snoring. Occasional PND.  -------------------------------- PSG not scheduled, PFTs  Otherwise pertinent review of systems is negative.  Past Medical History:  Diagnosis Date   Anemia    hx  of with pregnancy    Anxiety    ARF (acute renal failure) (HCC) 07/16/2019   Arthritis    Asthma    Colon polyps    Depression    GERD (gastroesophageal reflux disease)    Headache    occasional headaches    High cholesterol    Hypertension    Lithium toxicity 06/2019   has since resolved after discontinuing    PONV (postoperative nausea and vomiting)    Shortness of breath dyspnea    due to weight gain    Sleep apnea    has not use CPAP machine in 2 yrs/ DOES NOT KNOW IF SHE NEEDS TO USE C - PAP   Syncope and collapse    due to severe knee pain      Family History  Problem Relation Age of Onset   Alcohol abuse Mother    Arthritis Mother    Heart disease Mother    Hyperlipidemia Father    Hypertension Father    Heart disease Father 54       CABG   Gout Father    Hypertension Sister    Cancer Daughter        NEUROBLASTOMA   Breast cancer Maternal Grandmother    Cancer Maternal Grandmother        breast   Cancer Brother        colon, lung, liver     Past Surgical History:  Procedure Laterality Date  BACK SURGERY  2012   lumb fusion   CARPAL TUNNEL RELEASE  2007   both hands   COLONOSCOPY     ENDOMETRIAL ABLATION W/ NOVASURE     EXCISION METACARPAL MASS Right 03/10/2014   Procedure: EXCISION MASS RIGHT HAND FIRST WEB SPACE DORSAL PALMAR INCISION ;  Surgeon: Cindee Salt, MD;  Location: [facility];  Service: Orthopedics;  Laterality: Right;   EXCISIONAL TOTAL KNEE ARTHROPLASTY WITH ANTIBIOTIC SPACERS Left 09/19/2019   Procedure: EXCISIONAL  LEFT TOTAL KNEE ARTHROPLASTY WITH TOTAL KNEE REVISION;  Surgeon: Durene Romans, MD;  Location: [facility];  Service: Orthopedics;  Laterality: Left;  2 hrs   IRRIGATION AND DEBRIDEMENT KNEE Left 05/23/2014   Procedure: IRRIGATION AND DEBRIDEMENT KNEE;  Surgeon: [name], MD;  Location: [facility];  Service: Orthopedics;  Laterality: Left;  With POLYETHYLENE EXCHANGE   KNEE ARTHROSCOPY     let knee 03/2013    KNEE  ARTHROSCOPY WITH MEDIAL MENISECTOMY Right 10/22/2013   Procedure: RIGHT KNEE ARTHROSCOPY WITH MEDIAL MENISECTOMY, abrasion chrondrplasty Brigid Re OUT/CORRECT CURETTEMENT/BONE GRAFT/PROXIMAL TIBIA ;  Surgeon: Jacki Cones, MD;  Location: [facility];  Service: Orthopedics;  Laterality: Right;   TOTAL KNEE ARTHROPLASTY Left 04/16/2014   Procedure: LEFT TOTAL KNEE ARTHROPLASTY;  Surgeon: Jacki Cones, MD;  Location: [facility];  Service: Orthopedics;  Laterality: Left;   TOTAL KNEE ARTHROPLASTY Right 12/23/2014   Procedure: TOTAL KNEE ARTHROPLASTY;  Surgeon: Ranee Gosselin, MD;  Location: [facility];  Service: Orthopedics;  Laterality: Right;   trigger finger release surgeyr      bilateral    TUBAL LIGATION      Social History   Socioeconomic History   Marital status: Married    Spouse name: Trey Paula   Number of children: 2   Years of education: 12   Highest education level: Some college, no degree  Occupational History    Comment: Disabled  Tobacco Use   Smoking status: Never   Smokeless tobacco: Never  Vaping Use   Vaping Use: Never used  Substance and Sexual Activity   Alcohol use: No   Drug use: No   Sexual activity: Not on file  Other Topics Concern   Not on file  Social History Narrative   Lives with her husband who helps with grocery shopping and other transportation needs.     Has two children, Billey Gosling and Clydie Braun and grandchildren - they all live nearby.   There are two outside dogs and several  Horses.    Social Determinants of Health   Financial Resource Strain: Low Risk  (12/22/2022)   Overall Financial Resource Strain (CARDIA)    Difficulty of Paying Living Expenses: Not very hard  Food Insecurity: No Food Insecurity (12/22/2022)   Hunger Vital Sign    Worried About Running Out of Food in the Last Year: Never true    Ran Out of Food in the Last Year: Never true  Transportation Needs: No Transportation Needs (12/22/2022)   PRAPARE - Administrator, Civil Service  (Medical): No    Lack of Transportation (Non-Medical): No  Physical Activity: Inactive (12/22/2022)   Exercise Vital Sign    Days of Exercise per Week: 0 days    Minutes of Exercise per Session: 0 min  Stress: No Stress Concern Present (12/22/2022)   [employer] of Occupational Health - Occupational Stress Questionnaire    Feeling of Stress : Only a little  Social Connections: Moderately Integrated (12/22/2022)   Social Connection and  Isolation Panel [NHANES]    Frequency of Communication with Friends and Family: More than three times a week    Frequency of Social Gatherings with Friends and Family: Twice a week    Attends Religious Services: More than 4 times per year    Active Member of [employer] or Organizations: No    Attends Banker Meetings: Never    Marital Status: Married  Catering manager Violence: Not At Risk (07/14/2022)   Humiliation, Afraid, Rape, and Kick questionnaire    Fear of Current or Ex-Partner: No    Emotionally Abused: No    Physically Abused: No    Sexually Abused: No     Allergies  Allergen Reactions   Chocolate Flavor     Other reaction(s): headache   Other Itching and Rash    Other reaction(s): other   Adhesive [Tape] Other (See Comments)    Peeled off skin   Ceclor [Cefaclor] Other (See Comments)    Blisters on hands   Chocolate Other (See Comments)    migraines   Venlafaxine Other (See Comments)    "refuses to take - makes me feel like I am dying"   Lisinopril Cough     Outpatient Medications Prior to Visit  Medication Sig Dispense Refill   albuterol (VENTOLIN HFA) 108 (90 Base) MCG/ACT inhaler Inhale 1-2 puffs into the lungs every 6 (six) hours as needed for wheezing or shortness of breath. 1 each 11   Budeson-Glycopyrrol-Formoterol (BREZTRI AEROSPHERE) 160-9-4.8 MCG/ACT AERO Inhale 2 puffs into the lungs in the morning and at bedtime. (Patient not taking: Reported on 12/23/2022) 5.9 g 0   fluocinonide cream (LIDEX) 0.05 % Apply 1  application topically 2 (two) times daily.     furosemide (LASIX) 40 MG tablet Take 1 tablet (40 mg total) by mouth daily. 30 tablet 3   gabapentin (NEURONTIN) 300 MG capsule Take 1 capsule by mouth 2 (two) times daily.     levothyroxine (SYNTHROID) 100 MCG tablet TAKE 1 TABLET BY MOUTH EVERY DAY BEFORE BREAKFAST 90 tablet 3   losartan (COZAAR) 50 MG tablet Take 1 tablet (50 mg total) by mouth daily. 90 tablet 3   methocarbamol (ROBAXIN) 500 MG tablet Take 500 mg by mouth every 6 (six) hours as needed for muscle spasms.     metroNIDAZOLE (METROGEL) 0.75 % gel Apply topically daily. 45 g 5   montelukast (SINGULAIR) 10 MG tablet TAKE 1 TABLET BY MOUTH EVERYDAY AT BEDTIME 90 tablet 1   nabumetone (RELAFEN) 500 MG tablet TAKE 2 TABLETS (1,000 MG TOTAL) BY MOUTH 2 (TWO) TIMES DAILY. FOR MUSCLE AND JOINT PAIN 360 tablet 0   omeprazole (PRILOSEC) 40 MG capsule TAKE 1 CAPSULE BY MOUTH EVERY DAY 90 capsule 3   promethazine (PHENERGAN) 25 MG tablet TAKE 1 TABLET BY MOUTH EVERY 6 HOURS AS NEEDED FOR NAUSEA AND VOMITING 90 tablet 1   rosuvastatin (CRESTOR) 40 MG tablet TAKE 1 TABLET BY MOUTH EVERY DAY FOR CHOLESTEROL 90 tablet 0   sertraline (ZOLOFT) 50 MG tablet TAKE 1 TABLET BY MOUTH EVERYDAY AT BEDTIME 90 tablet 3   traZODone (DESYREL) 100 MG tablet TAKE 3 TABLETS (300MG ) BY MOUTH EVERY DAY AT BEDTIME 270 tablet 3   No facility-administered medications prior to visit.       Objective:   Physical Exam:  General appearance: 62 y.o., female, NAD, conversant  Eyes: anicteric sclerae; PERRL, tracking appropriately HENT: NCAT; MMM Neck: Trachea midline; no lymphadenopathy, no JVD Lungs: CTAB, no crackles, no wheeze,  with normal respiratory effort CV: RRR, no murmur  Abdomen: Soft, non-tender; non-distended, BS present  Extremities: No peripheral edema, warm Skin: Normal turgor and texture; no rash Psych: Appropriate affect Neuro: Alert and oriented to person and place, no focal deficit      There were no vitals filed for this visit.      on RA BMI Readings from Last 3 Encounters:  12/23/22 61.45 kg/m  11/15/22 60.42 kg/m  08/01/22 59.39 kg/m   Wt Readings from Last 3 Encounters:  12/23/22 (!) 358 lb (162.4 kg)  11/15/22 (!) 352 lb (159.7 kg)  08/01/22 (!) 346 lb (156.9 kg)     CBC    Component Value Date/Time   WBC 6.0 12/23/2022 1438   WBC 7.5 11/15/2022 1613   RBC 4.09 12/23/2022 1438   RBC 4.32 11/15/2022 1613   HGB 12.8 12/23/2022 1438   HCT 38.7 12/23/2022 1438   PLT 165 12/23/2022 1438   MCV 95 12/23/2022 1438   MCH 31.3 12/23/2022 1438   MCH 30.4 09/20/2019 0231   MCHC 33.1 12/23/2022 1438   MCHC 33.5 11/15/2022 1613   RDW 13.9 12/23/2022 1438   LYMPHSABS 1.5 12/23/2022 1438   MONOABS 0.5 11/15/2022 1613   EOSABS 0.3 12/23/2022 1438   BASOSABS 0.0 12/23/2022 1438    Low level eosinophilia historically  Chest Imaging: CTA Chest 01/2019 reviewed by me and remarkable for 8mm nodule near minor fissure, no LAD, some likely dependent atelectasis.   CXR 12/2019 reviewed by me and unremarkable  06/2019 NM perfusion: no perfusion defect  Pulmonary Functions Testing Results:    Latest Ref Rng & Units 04/01/2021   11:33 AM  PFT Results  FVC-Pre L 2.54   FVC-Predicted Pre % 72   FVC-Post L 2.50   FVC-Predicted Post % 71   Pre FEV1/FVC % % 86   Post FEV1/FCV % % 90   FEV1-Pre L 2.18   FEV1-Predicted Pre % 79   FEV1-Post L 2.25   DLCO uncorrected ml/min/mmHg 19.46   DLCO UNC% % 91   DLCO corrected ml/min/mmHg 20.40   DLCO COR %Predicted % 96   DLVA Predicted % 127   TLC L 4.53   TLC % Predicted % 87   RV % Predicted % 72     PFTs reviewed by me showing nonspecific ventilatory defect, equivocal results for air trapping and elevated DLCO for alveolar volume  Echocardiogram:   TTE 2021: mildly dilated LAD, LV hypertrophy, normal EF.  TTE 2014: G1DD  [facility] stress 2014 report essentially normal       Assessment & Plan:   #  Dyspnea on exertion: # Exertional CP: Could well be multifactorial. May have deconditioning and prior TTEs suggestive of diastolic dysfunction but she looks euvolemic today essentially. Asthma possible but less likely based on recent PFT. She thinks her chest pain/heaviness with exertion is simply weight related but could alternatively be angina.   # Chronic cough: PND and gerd are probably still not fully addressed for her. Needs to restart flonase. Perhaps intolerant of ppi (says she had nausea) and does not take consistently.   # Excessive daytime sleepiness: # Snoring: High risk for sleep disordered breathing   Plan: - labs and x ray today - you'll be called to schedule sleep study - try breztri 2 puff twice daily, stop it 4 weeks before PFT - would stay on singulair 10 mg daily for now - PFTs on same day as clinic visit in 8 weeks -  if CP with exertion unchanged and not responsive to breztri/albuterol, refer to cardiology for workup of possible angina      Omar Person, MD [facility] 01/15/2023 11:48 AM

## 2023-01-16 ENCOUNTER — Other Ambulatory Visit: Payer: Self-pay

## 2023-01-16 DIAGNOSIS — R0609 Other forms of dyspnea: Secondary | ICD-10-CM

## 2023-01-16 DIAGNOSIS — J4541 Moderate persistent asthma with (acute) exacerbation: Secondary | ICD-10-CM

## 2023-01-17 ENCOUNTER — Ambulatory Visit: Payer: Medicare HMO | Admitting: Family Medicine

## 2023-01-17 ENCOUNTER — Encounter: Payer: Self-pay | Admitting: Student

## 2023-01-17 ENCOUNTER — Other Ambulatory Visit: Payer: Self-pay | Admitting: Family Medicine

## 2023-01-17 ENCOUNTER — Ambulatory Visit (INDEPENDENT_AMBULATORY_CARE_PROVIDER_SITE_OTHER): Payer: Medicare HMO | Admitting: Student

## 2023-01-17 ENCOUNTER — Ambulatory Visit: Payer: Medicare HMO | Admitting: Student

## 2023-01-17 VITALS — BP 128/84 | HR 89 | Ht 64.0 in | Wt 355.4 lb

## 2023-01-17 DIAGNOSIS — R110 Nausea: Secondary | ICD-10-CM

## 2023-01-17 DIAGNOSIS — R0609 Other forms of dyspnea: Secondary | ICD-10-CM

## 2023-01-17 DIAGNOSIS — J4541 Moderate persistent asthma with (acute) exacerbation: Secondary | ICD-10-CM

## 2023-01-17 DIAGNOSIS — R053 Chronic cough: Secondary | ICD-10-CM | POA: Diagnosis not present

## 2023-01-17 MED ORDER — BREZTRI AEROSPHERE 160-9-4.8 MCG/ACT IN AERO: 2.0000 | INHALATION_SPRAY | Freq: Two times a day (BID) | RESPIRATORY_TRACT | 0 refills | Status: DC

## 2023-01-17 NOTE — Patient Instructions (Addendum)
-   Can resume breztri 2 puffs twice daily with spacer rinse mouth and brush tongue/teeth after each use. I will price out alternatives in case this appears to be expensive.  - albuterol 1-2 puffs as needed - we'll review sleep study at next visit after you activate link to schedule

## 2023-01-17 NOTE — Progress Notes (Signed)
Full PFT performed today. °

## 2023-01-17 NOTE — Patient Instructions (Signed)
Full PFT performed today. °

## 2023-01-18 ENCOUNTER — Encounter: Payer: Self-pay | Admitting: Family Medicine

## 2023-01-18 ENCOUNTER — Ambulatory Visit (INDEPENDENT_AMBULATORY_CARE_PROVIDER_SITE_OTHER): Payer: Medicare HMO | Admitting: Family Medicine

## 2023-01-18 VITALS — BP 131/79 | HR 83 | Temp 97.5°F | Ht 64.0 in | Wt 354.6 lb

## 2023-01-18 DIAGNOSIS — F5104 Psychophysiologic insomnia: Secondary | ICD-10-CM

## 2023-01-18 DIAGNOSIS — I10 Essential (primary) hypertension: Secondary | ICD-10-CM | POA: Diagnosis not present

## 2023-01-18 DIAGNOSIS — E119 Type 2 diabetes mellitus without complications: Secondary | ICD-10-CM | POA: Diagnosis not present

## 2023-01-18 DIAGNOSIS — F331 Major depressive disorder, recurrent, moderate: Secondary | ICD-10-CM

## 2023-01-18 DIAGNOSIS — E039 Hypothyroidism, unspecified: Secondary | ICD-10-CM

## 2023-01-18 DIAGNOSIS — K219 Gastro-esophageal reflux disease without esophagitis: Secondary | ICD-10-CM

## 2023-01-18 DIAGNOSIS — E7800 Pure hypercholesterolemia, unspecified: Secondary | ICD-10-CM

## 2023-01-18 DIAGNOSIS — E782 Mixed hyperlipidemia: Secondary | ICD-10-CM

## 2023-01-18 DIAGNOSIS — M2390 Unspecified internal derangement of unspecified knee: Secondary | ICD-10-CM | POA: Diagnosis not present

## 2023-01-18 DIAGNOSIS — R7303 Prediabetes: Secondary | ICD-10-CM | POA: Diagnosis not present

## 2023-01-18 DIAGNOSIS — Z6841 Body Mass Index (BMI) 40.0 and over, adult: Secondary | ICD-10-CM

## 2023-01-18 LAB — PULMONARY FUNCTION TEST
DL/VA % pred: 142 %
DL/VA: 6 ml/min/mmHg/L
DLCO cor % pred: 107 %
DLCO cor: 21.79 ml/min/mmHg
DLCO unc % pred: 105 %
DLCO unc: 21.38 ml/min/mmHg
FEF 25-75 Post: 2.65 L/sec
FEF 25-75 Pre: 2.66 L/sec
FEF2575-%Change-Post: 0 %
FEF2575-%Pred-Post: 110 %
FEF2575-%Pred-Pre: 111 %
FEV1-%Change-Post: -2 %
FEV1-%Pred-Post: 76 %
FEV1-%Pred-Pre: 78 %
FEV1-Post: 1.98 L
FEV1-Pre: 2.03 L
FEV1FVC-%Change-Post: 0 %
FEV1FVC-%Pred-Pre: 110 %
FEV6-%Change-Post: -3 %
FEV6-%Pred-Post: 70 %
FEV6-%Pred-Pre: 72 %
FEV6-Post: 2.27 L
FEV6-Pre: 2.34 L
FEV6FVC-%Pred-Post: 103 %
FEV6FVC-%Pred-Pre: 103 %
FVC-%Change-Post: -3 %
FVC-%Pred-Post: 68 %
FVC-%Pred-Pre: 70 %
FVC-Post: 2.27 L
FVC-Pre: 2.34 L
Post FEV1/FVC ratio: 87 %
Post FEV6/FVC ratio: 100 %
Pre FEV1/FVC ratio: 87 %
Pre FEV6/FVC Ratio: 100 %
RV % pred: 71 %
RV: 1.41 L
TLC % pred: 76 %
TLC: 3.87 L

## 2023-01-18 LAB — BAYER DCA HB A1C WAIVED: HB A1C (BAYER DCA - WAIVED): 6.6 % — ABNORMAL HIGH (ref 4.8–5.6)

## 2023-01-18 MED ORDER — NABUMETONE 500 MG PO TABS: 1000.0000 mg | ORAL_TABLET | Freq: Two times a day (BID) | ORAL | 0 refills | Status: DC

## 2023-01-18 MED ORDER — LEVOTHYROXINE SODIUM 100 MCG PO TABS: ORAL_TABLET | ORAL | 3 refills | Status: DC

## 2023-01-18 MED ORDER — METFORMIN HCL ER 500 MG PO TB24
500.0000 mg | ORAL_TABLET | Freq: Every day | ORAL | 2 refills | Status: DC
Start: 2023-01-18 — End: 2023-02-20

## 2023-01-18 MED ORDER — FLUOCINONIDE 0.05 % EX CREA: 1.0000 | TOPICAL_CREAM | Freq: Two times a day (BID) | CUTANEOUS | 5 refills | Status: DC

## 2023-01-18 NOTE — Patient Instructions (Signed)

## 2023-01-18 NOTE — Progress Notes (Signed)
Subjective:  Patient ID: [name], female    DOB: 16-Dec-1960  Age: 62 y.o. MRN: [id number]  CC: Medical Management of Chronic Issues   HPI PACIENCE LAVERE presents for  follow-up on  thyroid. The patient has a history of hypothyroidism for many years. It has been stable recently. Pt. denies any change in  voice, loss of hair, heat or cold intolerance. Energy level has been adequate to good. Patient denies constipation and diarrhea. No myxedema. Medication is as noted below. Verified that pt is taking it daily on an empty stomach. Well tolerated.  Cut from 7 Dr. Reino Kent to 1/2 can daily.    in for follow-up of elevated cholesterol. Doing well without complaints on current medication. Denies side effects of statin including myalgia and arthralgia and nausea. Currently no chest pain, shortness of breath or other cardiovascular related symptoms noted.  Patient in for follow-up of GERD. Currently asymptomatic taking  PPI daily. There is no chest pain or heartburn. No hematemesis and no melena. No dysphagia or choking. Onset is remote. Progression is stable. Complicating factors, none.  Sleeping well with [place] eof trazodone     01/18/2023    2:12 PM 01/18/2023    2:09 PM 12/23/2022    2:25 PM  Depression screen PHQ 2/9  Decreased Interest 1 0 2  Down, Depressed, Hopeless 1 0 1  PHQ - 2 Score 2 0 3  Altered sleeping 1  0  Tired, decreased energy 2  1  Change in appetite 1  0  Feeling bad or failure about yourself  2  1  Trouble concentrating 0  0  Moving slowly or fidgety/restless 0  0  Suicidal thoughts 0  0  PHQ-9 Score 8  5  Difficult doing work/chores Not difficult at all  Not difficult at all    History Kenley has a past medical history of Anemia, Aneurysmal bone cyst, 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   She has a past surgical history that includes Endometrial ablation w/ novasure; Carpal tunnel release (2007); trigger finger release surgeyr ; Tubal ligation; Knee arthroscopy; Knee arthroscopy with medial menisectomy (Right, 10/22/2013); Back surgery (2012); Colonoscopy; Excision metacarpal mass (Right, 03/10/2014); Total knee arthroplasty (Left, 04/16/2014); Irrigation and debridement knee (Left, 05/23/2014); Total knee arthroplasty (Right, 12/23/2014); and Excisional total knee arthroplasty with antibiotic spacers (Left, 09/19/2019).   Her family history includes Alcohol abuse in her mother; Arthritis in her mother; Breast cancer in her maternal grandmother; Cancer in her brother, daughter, and maternal grandmother; Gout in her father; Heart disease in her mother; Heart disease (age of onset: 40) in her father; Hyperlipidemia in her father; Hypertension in her father and sister.She reports that she has never smoked. She has never used smokeless tobacco. She reports that she does not drink alcohol and does not use drugs.    ROS Review of Systems  Constitutional: Negative.   HENT: Negative.    Eyes:  Negative for visual disturbance.  Respiratory:  Negative for shortness of breath.   Cardiovascular:  Negative for chest pain.  Gastrointestinal:  Negative for abdominal pain.  Musculoskeletal:  Negative for arthralgias.    Objective:  BP  131/79   Pulse 83   Temp (!) 97.5 F (36.4 C)   Ht 5\' 4"  (1.626 m)   Wt (!) 354 lb 9.6 oz (160.8 kg)   SpO2 92%   BMI 60.87 kg/m   BP Readings from Last 3 Encounters:  01/18/23 131/79  01/17/23 128/84  12/23/22 133/74    Wt Readings from Last 3 Encounters:  01/18/23  (!) 354 lb 9.6 oz (160.8 kg)  01/17/23 (!) 355 lb 6.4 oz (161.2 kg)  12/23/22 (!) 358 lb (162.4 kg)     Physical Exam Constitutional:      General: She is not in acute distress.    Appearance: She is well-developed.  Cardiovascular:     Rate and Rhythm: Normal rate and regular rhythm.  Pulmonary:     Breath sounds: Normal breath sounds.  Musculoskeletal:        General: Normal range of motion.  Skin:    General: Skin is warm and dry.  Neurological:     Mental Status: She is alert and oriented to person, place, and time.       Assessment & Plan:   Sunee was seen today for medical management of chronic issues.  Diagnoses and all orders for this visit:  High cholesterol -     Lipid panel  Essential hypertension, benign -     CBC with Differential/Platelet -     CMP14+EGFR  Diabetes mellitus, new onset (HCC) -     Bayer DCA Hb A1c Waived  Hypothyroidism, unspecified type -     TSH + free T4 -     levothyroxine (SYNTHROID) 100 MCG tablet; TAKE 1 TABLET BY MOUTH EVERY DAY BEFORE BREAKFAST  Moderate episode of recurrent major depressive disorder (HCC)  Chronic insomnia  Gastroesophageal reflux disease without esophagitis  Mixed hyperlipidemia  Morbid obesity with BMI of 50.0-59.9, adult (HCC) -     Amb Ref to Medical Weight Management  Internal derangement of multiple sites of knee, unspecified laterality  Other orders -     fluocinonide cream (LIDEX) 0.05 %; Apply 1 Application topically 2 (two) times daily. -     nabumetone (RELAFEN) 500 MG tablet; Take 2 tablets (1,000 mg total) by mouth 2 (two) times daily. For muscle and joint pain -     metFORMIN (GLUCOPHAGE-XR) 500 MG 24 hr tablet; Take 1 tablet (500 mg total) by mouth daily with breakfast.       I am having [name]. Bjorn start on metFORMIN. I am also having her maintain her methocarbamol, gabapentin, omeprazole, sertraline, traZODone, metroNIDAZOLE, losartan, albuterol, furosemide, rosuvastatin,  Breztri Aerosphere, montelukast, promethazine, levothyroxine, fluocinonide cream, and nabumetone.  Allergies as of 01/18/2023       Reactions   Chocolate Flavor    Other reaction(s): headache   Other Itching, Rash   Other reaction(s): other   Adhesive [tape] Other (See Comments)   Peeled off skin   Ceclor [cefaclor] Other (See Comments)   Blisters on hands   Chocolate Other (See Comments)   migraines   Venlafaxine Other (See Comments)   "refuses to take - makes me feel like I am dying"   Lisinopril Cough        Medication List        Accurate as of January 18, 2023 11:59 PM. If you have any questions, ask your nurse or doctor.          albuterol 108 (90 Base) MCG/ACT inhaler Commonly known as: VENTOLIN HFA Inhale 1-2 puffs into  the lungs every 6 (six) hours as needed for wheezing or shortness of breath.   Breztri Aerosphere 160-9-4.8 MCG/ACT Aero Generic drug: Budeson-Glycopyrrol-Formoterol Inhale 2 puffs into the lungs in the morning and at bedtime. What changed: Another medication with the same name was removed. Continue taking this medication, and follow the directions you see here. Changed by: [name], MD   fluocinonide cream 0.05 % Commonly known as: LIDEX Apply 1 Application topically 2 (two) times daily.   furosemide 40 MG tablet Commonly known as: LASIX Take 1 tablet (40 mg total) by mouth daily.   gabapentin 300 MG capsule Commonly known as: NEURONTIN Take 1 capsule by mouth 2 (two) times daily.   levothyroxine 100 MCG tablet Commonly known as: SYNTHROID TAKE 1 TABLET BY MOUTH EVERY DAY BEFORE BREAKFAST   losartan 50 MG tablet Commonly known as: COZAAR Take 1 tablet (50 mg total) by mouth daily.   metFORMIN 500 MG 24 hr tablet Commonly known as: GLUCOPHAGE-XR Take 1 tablet (500 mg total) by mouth daily with breakfast. Started by: [name], MD   methocarbamol 500 MG tablet Commonly known as: ROBAXIN Take 500 mg by mouth every 6 (six)  hours as needed for muscle spasms.   metroNIDAZOLE 0.75 % gel Commonly known as: METROGEL Apply topically daily.   montelukast 10 MG tablet Commonly known as: SINGULAIR TAKE 1 TABLET BY MOUTH EVERYDAY AT BEDTIME   nabumetone 500 MG tablet Commonly known as: RELAFEN Take 2 tablets (1,000 mg total) by mouth 2 (two) times daily. For muscle and joint pain   omeprazole 40 MG capsule Commonly known as: PRILOSEC TAKE 1 CAPSULE BY MOUTH EVERY DAY   promethazine 25 MG tablet Commonly known as: PHENERGAN TAKE 1 TABLET BY MOUTH EVERY 6 HOURS AS NEEDED FOR NAUSEA AND VOMITING   rosuvastatin 40 MG tablet Commonly known as: CRESTOR TAKE 1 TABLET BY MOUTH EVERY DAY FOR CHOLESTEROL   sertraline 50 MG tablet Commonly known as: ZOLOFT TAKE 1 TABLET BY MOUTH EVERYDAY AT BEDTIME   traZODone 100 MG tablet Commonly known as: DESYREL TAKE 3 TABLETS (300MG ) BY MOUTH EVERY DAY AT BEDTIME         Follow-up: Return in about 1 month (around 02/17/2023).  [name], M.D.

## 2023-01-19 LAB — CMP14+EGFR
ALT: 33 IU/L — ABNORMAL HIGH (ref 0–32)
AST: 36 IU/L (ref 0–40)
Albumin: 4.5 g/dL (ref 3.8–4.9)
Alkaline Phosphatase: 116 IU/L (ref 44–121)
BUN/Creatinine Ratio: 14 (ref 9–23)
BUN: 17 mg/dL (ref 6–24)
Bilirubin Total: 0.3 mg/dL (ref 0.0–1.2)
CO2: 24 mmol/L (ref 20–29)
Calcium: 9.8 mg/dL (ref 8.7–10.2)
Chloride: 100 mmol/L (ref 96–106)
Creatinine, Ser: 1.24 mg/dL — ABNORMAL HIGH (ref 0.57–1.00)
Globulin, Total: 2.6 g/dL (ref 1.5–4.5)
Glucose: 106 mg/dL — ABNORMAL HIGH (ref 70–99)
Potassium: 5.1 mmol/L (ref 3.5–5.2)
Sodium: 141 mmol/L (ref 134–144)
Total Protein: 7.1 g/dL (ref 6.0–8.5)
eGFR: 50 mL/min/{1.73_m2} — ABNORMAL LOW (ref >59)

## 2023-01-19 LAB — CBC WITH DIFFERENTIAL/PLATELET
Basophils Absolute: 0 10*3/uL (ref 0.0–0.2)
Basos: 1 %
EOS (ABSOLUTE): 0.3 10*3/uL (ref 0.0–0.4)
Eos: 4 %
Hematocrit: 40.7 % (ref 34.0–46.6)
Hemoglobin: 12.8 g/dL (ref 11.1–15.9)
Immature Grans (Abs): 0 10*3/uL (ref 0.0–0.1)
Immature Granulocytes: 0 %
Lymphocytes Absolute: 1.5 10*3/uL (ref 0.7–3.1)
Lymphs: 25 %
MCH: 29.4 pg (ref 26.6–33.0)
MCHC: 31.4 g/dL — ABNORMAL LOW (ref 31.5–35.7)
MCV: 94 fL (ref 79–97)
Monocytes Absolute: 0.4 10*3/uL (ref 0.1–0.9)
Monocytes: 6 %
Neutrophils Absolute: 4 10*3/uL (ref 1.4–7.0)
Neutrophils: 64 %
Platelets: 175 10*3/uL (ref 150–450)
RBC: 4.35 x10E6/uL (ref 3.77–5.28)
RDW: 14 % (ref 11.7–15.4)
WBC: 6.2 10*3/uL (ref 3.4–10.8)

## 2023-01-19 LAB — LIPID PANEL
Chol/HDL Ratio: 4 ratio (ref 0.0–4.4)
Cholesterol, Total: 139 mg/dL (ref 100–199)
HDL: 35 mg/dL — ABNORMAL LOW (ref >39)
LDL Chol Calc (NIH): 71 mg/dL (ref 0–99)
Triglycerides: 194 mg/dL — ABNORMAL HIGH (ref 0–149)
VLDL Cholesterol Cal: 33 mg/dL (ref 5–40)

## 2023-01-19 LAB — TSH+FREE T4
Free T4: 1.5 ng/dL (ref 0.82–1.77)
TSH: 1.17 u[IU]/mL (ref 0.450–4.500)

## 2023-01-19 NOTE — Progress Notes (Signed)
Hello [name],  Your lab result is normal and/or stable.Some minor variations that are not significant are commonly marked abnormal, but do not represent any medical problem for you.  Best regards, Oriya Kettering, M.D.

## 2023-01-20 ENCOUNTER — Other Ambulatory Visit: Payer: Self-pay | Admitting: Family Medicine

## 2023-01-20 DIAGNOSIS — Z1231 Encounter for screening mammogram for malignant neoplasm of breast: Secondary | ICD-10-CM

## 2023-01-23 ENCOUNTER — Other Ambulatory Visit: Payer: Self-pay | Admitting: Family Medicine

## 2023-01-23 ENCOUNTER — Encounter: Payer: Self-pay | Admitting: Family Medicine

## 2023-01-23 MED ORDER — BLOOD GLUCOSE MONITORING SUPPL DEVI
1.0000 | Freq: Three times a day (TID) | 0 refills | Status: DC
Start: 2023-01-23 — End: 2023-07-24

## 2023-01-23 MED ORDER — BLOOD GLUCOSE TEST VI STRP: 1.0000 | ORAL_STRIP | Freq: Three times a day (TID) | 0 refills | Status: DC

## 2023-01-23 MED ORDER — LANCET DEVICE MISC: 1.0000 | Freq: Three times a day (TID) | 0 refills | Status: AC

## 2023-01-23 MED ORDER — LANCETS MISC. MISC: 1.0000 | Freq: Three times a day (TID) | 0 refills | Status: AC

## 2023-01-30 ENCOUNTER — Ambulatory Visit
Admission: RE | Admit: 2023-01-30 | Discharge: 2023-01-30 | Disposition: A | Discharge status: Home / Self Care | Payer: Medicare HMO | Source: Ambulatory Visit | Attending: Family Medicine | Admitting: Family Medicine

## 2023-01-30 ENCOUNTER — Ambulatory Visit: Payer: Medicare HMO | Admitting: Family Medicine

## 2023-01-30 DIAGNOSIS — Z1231 Encounter for screening mammogram for malignant neoplasm of breast: Secondary | ICD-10-CM | POA: Diagnosis not present

## 2023-02-13 NOTE — Progress Notes (Deleted)
Office: [phone]  /  Fax: [phone]   TeleHealth Visit:  This visit was completed with telemedicine (audio/video) technology. [name] has verbally consented to this TeleHealth visit. The patient is located at home, the provider is located at home. The participants in this visit include the listed provider and patient. The visit was conducted today via MyChart video.  Initial Visit  [name] was seen via virtual visit today to evaluate for treatment of obesity. She is interested in losing weight to improve overall health and reduce the risk of weight related complications. She presents today to review program treatment options, initial physical assessment, and evaluation.     Height: 5\' 4"  Weight: 354 lbs BMI: 60  She was referred by: PCP  When asked what else they would like to accomplish? She states: {EMHopetoaccomplish:28304}  Weight history: ***  When asked how has your weight affected you? 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   Anxiety    ARF (acute renal failure) (HCC) 07/16/2019   Arthritis    Asthma    Colon polyps    Complex tear of medial meniscus of right knee as current injury 10/22/2013   Depression    GERD (gastroesophageal reflux disease)    Headache    occasional headaches    High cholesterol    Hypertension    Lithium toxicity 06/2019   has since resolved after discontinuing     PONV (postoperative nausea and vomiting)    Primary insomnia 08/20/2019   Shortness of breath dyspnea    due to weight gain    Sleep apnea    has not use CPAP machine in 2 yrs/ DOES NOT KNOW IF SHE NEEDS TO USE C - PAP   Syncope and collapse    due to severe knee pain    Total knee replacement status 04/16/2014     Objective:     General:  Alert, oriented and cooperative. Patient is in no acute distress.  Respiratory: Normal respiratory effort, no problems with respiration noted  Mental Status: Normal mood and affect. Normal behavior. Normal judgment and thought content.    Assessment and Plan:   1. Type 2 Diabetes Mellitus with other specified complication, without long-term current use of insulin HgbA1c is at goal. Last A1c was 6.6. This was recently diagnosed. CBGs: {dwwcbg:29160} Episodes of hypoglycemia: {yes***/no:17258} Medication(s): {dwwpharmacotherapy:29109}  Lab Results  Component Value Date   HGBA1C 6.6 (H) 01/18/2023   HGBA1C 5.5 08/20/2019   HGBA1C 6.0 04/02/2019   Lab Results  Component Value Date   LDLCALC 71 01/18/2023   CREATININE 1.24 (H) 01/18/2023   Lab Results  Component Value Date   GFR 77.95 10/16/2014   GFR 73.04 12/25/2013    Plan: {dwwmed:29123} {dwwpharmacotherapy:29109}   2. ***  3. Obesity Treatment / Action Plan:  Will complete provided nutritional and psychosocial assessment questionnaire before the next appointment. Will be scheduled for indirect calorimetry to determine resting energy expenditure in a fasting state.  This will allow [place] to create a reduced calorie, high-protein meal plan to promote loss of fat mass while preserving muscle mass. Was  counseled on pharmacotherapy and role as an adjunct in weight management.   Obesity Education Performed Today:  We discussed obesity as a disease and the importance of a more detailed evaluation of all the factors contributing to the disease.  We discussed the importance of  long term lifestyle changes which include nutrition, exercise and behavioral modifications as well as the importance of customizing this to her specific health and social needs.  We discussed the benefits of reaching a healthier weight to alleviate the symptoms of existing conditions and reduce the risks of the biomechanical, metabolic and psychological effects of obesity.  [name] appears to be in the action stage of change and states they are ready to start intensive lifestyle modifications and behavioral modifications.  ______________________________________________________________________________  She will be contacted by [facility] to set up initial appointment and the first follow up appointment with a physician.   The following office policies were discussed. She voiced understanding: - Patient will be considered late at 6 minutes past appointment time.   - For the first office visit, patient needs to arrive 1 hour early, fasting except for water. Patient should arrive 15 minutes early for all other visits. -  Patient will bring completed new patient paperwork to first office visit.  If not, appointment will be rescheduled.  30 minutes was spent today on this visit including the above counseling, pre-visit chart review, and post-visit documentation.  Reviewed by clinician on day of visit: allergies, medications, problem list, medical history, surgical history, family history, social history, and previous encounter notes pertinent to obesity diagnosis.    Jesse Sans, FNP

## 2023-02-14 ENCOUNTER — Telehealth (INDEPENDENT_AMBULATORY_CARE_PROVIDER_SITE_OTHER): Payer: Medicare HMO | Admitting: Family Medicine

## 2023-02-14 DIAGNOSIS — Z6841 Body Mass Index (BMI) 40.0 and over, adult: Secondary | ICD-10-CM

## 2023-02-14 DIAGNOSIS — E1169 Type 2 diabetes mellitus with other specified complication: Secondary | ICD-10-CM

## 2023-02-16 ENCOUNTER — Encounter: Payer: Self-pay | Admitting: Family Medicine

## 2023-02-16 ENCOUNTER — Ambulatory Visit (INDEPENDENT_AMBULATORY_CARE_PROVIDER_SITE_OTHER): Payer: Medicare HMO | Admitting: Family Medicine

## 2023-02-16 VITALS — BP 133/77 | HR 83 | Temp 97.6°F | Ht 64.0 in | Wt 350.2 lb

## 2023-02-16 DIAGNOSIS — Z7984 Long term (current) use of oral hypoglycemic drugs: Secondary | ICD-10-CM

## 2023-02-16 DIAGNOSIS — E119 Type 2 diabetes mellitus without complications: Secondary | ICD-10-CM

## 2023-02-16 NOTE — Progress Notes (Signed)
Subjective:  Patient ID: [name], female    DOB: 08-05-1960  Age: 62 y.o. MRN: [id number]  CC: Follow-up   HPI [name] presents for presents forFollow-up of diabetes. Patient checks blood sugar at home.   105 fasting and 115 postprandial Patient denies symptoms such as polyuria, polydipsia, excessive hunger, nausea No significant hypoglycemic spells noted. Medications reviewed. Pt reports taking them regularly without complication/adverse reaction being reported today.  Lab Results  Component Value Date   HGBA1C 6.6 (H) 01/18/2023   HGBA1C 5.5 08/20/2019   HGBA1C 6.0 04/02/2019         02/16/2023    2:07 PM 02/16/2023    2:00 PM 01/18/2023    2:12 PM  Depression screen PHQ 2/9  Decreased Interest 2 0 1  Down, Depressed, Hopeless 2 0 1  PHQ - 2 Score 4 0 2  Altered sleeping 1  1  Tired, decreased energy 1  2  Change in appetite 1  1  Feeling bad or failure about yourself  1  2  Trouble concentrating 0  0  Moving slowly or fidgety/restless 0  0  Suicidal thoughts 0  0  PHQ-9 Score 8  8  Difficult doing work/chores Not difficult at all  Not difficult at all    History [name] has a past medical history of Anemia, Aneurysmal bone cyst, other site (10/22/2013), Anxiety, ARF (acute renal failure) (HCC) (07/16/2019), Arthritis, Asthma, Colon polyps, Complex tear of medial meniscus of right knee as current injury (10/22/2013), Depression, GERD (gastroesophageal reflux disease), Headache, High cholesterol, Hypertension, Lithium toxicity (06/2019), PONV (postoperative nausea and vomiting), Primary insomnia (08/20/2019), Shortness of breath dyspnea, Sleep apnea, Syncope and collapse, and Total knee replacement status (04/16/2014).   She has a past surgical history that includes Endometrial ablation w/ novasure; Carpal tunnel release (2007); trigger finger release surgeyr ; Tubal ligation; Knee arthroscopy; Knee arthroscopy with medial menisectomy (Right, 10/22/2013); Back  surgery (2012); Colonoscopy; Excision metacarpal mass (Right, 03/10/2014); Total knee arthroplasty (Left, 04/16/2014); Irrigation and debridement knee (Left, 05/23/2014); Total knee arthroplasty (Right, 12/23/2014); and Excisional total knee arthroplasty with antibiotic spacers (Left, 09/19/2019).   Her family history includes Alcohol abuse in her mother; Arthritis in her mother; Breast cancer in her maternal grandmother; Cancer in her brother, daughter, and maternal grandmother; Gout in her father; Heart disease in her mother; Heart disease (age of onset: 40) in her father; Hyperlipidemia in her father; Hypertension in her father and sister.She reports that she has never smoked. She has never used smokeless tobacco. She reports that she does not drink alcohol and does not use drugs.    ROS Review of Systems  Constitutional: Negative.   HENT: Negative.    Eyes:  Negative for visual disturbance.  Respiratory:  Negative for shortness of breath.   Cardiovascular:  Negative for chest pain.  Gastrointestinal:  Negative for abdominal pain.  Musculoskeletal:  Negative for arthralgias.    Objective:  BP 133/77   Pulse 83   Temp 97.6 F (36.4 C)   Ht 5\' 4"  (1.626 m)   Wt (!) 350 lb 3.2 oz (158.8 kg)   BMI 60.11 kg/m   BP Readings from Last 3 Encounters:  02/16/23 133/77  01/18/23 131/79  01/17/23 128/84    Wt Readings from Last 3 Encounters:  02/16/23 (!) 350 lb 3.2 oz (158.8 kg)  01/18/23 (!) 354 lb 9.6 oz (160.8 kg)  01/17/23 (!) 355 lb 6.4 oz (161.2 kg)     Physical Exam  Constitutional:      General: She is not in acute distress.    Appearance: She is well-developed.  Cardiovascular:     Rate and Rhythm: Normal rate and regular rhythm.  Pulmonary:     Breath sounds: Normal breath sounds.  Musculoskeletal:        General: Normal range of motion.  Skin:    General: Skin is warm and dry.  Neurological:     Mental Status: She is alert and oriented to person, place, and time.        Assessment & Plan:   [name] was seen today for follow-up.  Diagnoses and all orders for this visit:  Diabetes mellitus, new onset (HCC) -     Amb ref to Medical Nutrition Therapy-MNT       I am having [name]. Sweatt maintain her methocarbamol, gabapentin, omeprazole, sertraline, traZODone, metroNIDAZOLE, losartan, albuterol, furosemide, rosuvastatin, Breztri Aerosphere, montelukast, promethazine, levothyroxine, fluocinonide cream, nabumetone, metFORMIN, Blood Glucose Monitoring Suppl, BLOOD GLUCOSE TEST STRIPS, Lancet Device, and Lancets Misc..  Allergies as of 02/16/2023       Reactions   Chocolate Flavor    Other reaction(s): headache   Other Itching, Rash   Other reaction(s): other   Adhesive [tape] Other (See Comments)   Peeled off skin   Ceclor [cefaclor] Other (See Comments)   Blisters on hands   Chocolate Other (See Comments)   migraines   Venlafaxine Other (See Comments)   "refuses to take - makes me feel like I am dying"   Lisinopril Cough        Medication List        Accurate as of February 16, 2023 11:59 PM. If you have any questions, ask your nurse or doctor.          albuterol 108 (90 Base) MCG/ACT inhaler Commonly known as: VENTOLIN HFA Inhale 1-2 puffs into the lungs every 6 (six) hours as needed for wheezing or shortness of breath.   Blood Glucose Monitoring Suppl Devi 1 each by Does not apply route in the morning, at noon, and at bedtime. May substitute to any manufacturer covered by patient's insurance.   BLOOD GLUCOSE TEST STRIPS Strp 1 each by In Vitro route in the morning, at noon, and at bedtime. May substitute to any manufacturer covered by patient's insurance.   Breztri Aerosphere 160-9-4.8 MCG/ACT Aero Generic drug: Budeson-Glycopyrrol-Formoterol Inhale 2 puffs into the lungs in the morning and at bedtime.   fluocinonide cream 0.05 % Commonly known as: LIDEX Apply 1 Application topically 2 (two) times daily.   furosemide  40 MG tablet Commonly known as: LASIX Take 1 tablet (40 mg total) by mouth daily.   gabapentin 300 MG capsule Commonly known as: NEURONTIN Take 1 capsule by mouth 2 (two) times daily.   Lancet Device Misc 1 each by Does not apply route in the morning, at noon, and at bedtime. May substitute to any manufacturer covered by patient's insurance.   Lancets Misc. Misc 1 each by Does not apply route in the morning, at noon, and at bedtime. May substitute to any manufacturer covered by patient's insurance.   levothyroxine 100 MCG tablet Commonly known as: SYNTHROID TAKE 1 TABLET BY MOUTH EVERY DAY BEFORE BREAKFAST   losartan 50 MG tablet Commonly known as: COZAAR Take 1 tablet (50 mg total) by mouth daily.   metFORMIN 500 MG 24 hr tablet Commonly known as: GLUCOPHAGE-XR Take 1 tablet (500 mg total) by mouth daily with breakfast.   methocarbamol 500  MG tablet Commonly known as: ROBAXIN Take 500 mg by mouth every 6 (six) hours as needed for muscle spasms.   metroNIDAZOLE 0.75 % gel Commonly known as: METROGEL Apply topically daily.   montelukast 10 MG tablet Commonly known as: SINGULAIR TAKE 1 TABLET BY MOUTH EVERYDAY AT BEDTIME   nabumetone 500 MG tablet Commonly known as: RELAFEN Take 2 tablets (1,000 mg total) by mouth 2 (two) times daily. For muscle and joint pain   omeprazole 40 MG capsule Commonly known as: PRILOSEC TAKE 1 CAPSULE BY MOUTH EVERY DAY   promethazine 25 MG tablet Commonly known as: PHENERGAN TAKE 1 TABLET BY MOUTH EVERY 6 HOURS AS NEEDED FOR NAUSEA AND VOMITING   rosuvastatin 40 MG tablet Commonly known as: CRESTOR TAKE 1 TABLET BY MOUTH EVERY DAY FOR CHOLESTEROL   sertraline 50 MG tablet Commonly known as: ZOLOFT TAKE 1 TABLET BY MOUTH EVERYDAY AT BEDTIME   traZODone 100 MG tablet Commonly known as: DESYREL TAKE 3 TABLETS (300MG ) BY MOUTH EVERY DAY AT BEDTIME         Follow-up: Return in about 2 months (around 04/19/2023) for  diabetes.  [name], M.D.

## 2023-02-18 ENCOUNTER — Other Ambulatory Visit: Payer: Self-pay | Admitting: Family Medicine

## 2023-02-18 DIAGNOSIS — M7989 Other specified soft tissue disorders: Secondary | ICD-10-CM

## 2023-02-19 ENCOUNTER — Encounter: Payer: Self-pay | Admitting: Family Medicine

## 2023-02-21 ENCOUNTER — Other Ambulatory Visit: Payer: Self-pay | Admitting: Family Medicine

## 2023-02-23 ENCOUNTER — Other Ambulatory Visit: Payer: Self-pay | Admitting: Family Medicine

## 2023-03-07 ENCOUNTER — Encounter: Payer: Self-pay | Admitting: *Deleted

## 2023-03-08 ENCOUNTER — Encounter: Payer: Self-pay | Admitting: Adult Health

## 2023-03-08 ENCOUNTER — Ambulatory Visit (INDEPENDENT_AMBULATORY_CARE_PROVIDER_SITE_OTHER): Payer: Medicare HMO | Admitting: Adult Health

## 2023-03-08 ENCOUNTER — Other Ambulatory Visit (HOSPITAL_COMMUNITY): Admission: RE | Admit: 2023-03-08 | Payer: Medicare HMO | Source: Ambulatory Visit

## 2023-03-08 VITALS — BP 136/74 | HR 85 | Ht 64.0 in | Wt 346.0 lb

## 2023-03-08 DIAGNOSIS — Z01419 Encounter for gynecological examination (general) (routine) without abnormal findings: Secondary | ICD-10-CM | POA: Insufficient documentation

## 2023-03-08 DIAGNOSIS — Z1211 Encounter for screening for malignant neoplasm of colon: Secondary | ICD-10-CM | POA: Insufficient documentation

## 2023-03-08 DIAGNOSIS — Z1151 Encounter for screening for human papillomavirus (HPV): Secondary | ICD-10-CM | POA: Insufficient documentation

## 2023-03-08 LAB — HEMOCCULT GUIAC POC 1CARD (OFFICE): Fecal Occult Blood, POC: NEGATIVE

## 2023-03-08 NOTE — Progress Notes (Signed)
Patient ID: [name], female   DOB: 10/13/1960, 62 y.o.   MRN: [id number] History of Present Illness: [name] is a 62 year old white female,married, PM in for a well woman gyn exam and pap. It has been years since last pap.  PCP is Dr Darlyn Read.   Current Medications, Allergies, Past Medical History, Past Surgical History, Family History and Social History were reviewed in [name] record.     Review of Systems: Patient denies any headaches, hearing loss, fatigue, blurred vision, shortness of breath, chest pain, abdominal pain, problems with bowel movements, urination, or intercourse. No joint pain or mood swings.  Has had several knee surgeries and a back surgery.    Physical Exam:BP 136/74 (BP Location: Left Arm, Patient Position: Sitting, Cuff Size: Normal)   Pulse 85   Ht 5\' 4"  (1.626 m)   Wt (!) 346 lb (156.9 kg)   BMI 59.39 kg/m   General:  Well developed, well nourished, no acute distress Skin:  Warm and dry, has multiple skin tags and AK  Neck:  Midline trachea, normal thyroid, good ROM, no lymphadenopathy Lungs; Clear to auscultation bilaterally Breast:  No dominant palpable mass, retraction, or nipple discharge Cardiovascular: Regular rate and rhythm Abdomen:  Soft, non tender, no hepatosplenomegaly Pelvic:  External genitalia is normal in appearance, no lesions.  The vagina is pale. Urethra has no lesions or masses. The cervix is smooth, pap with HR HPV genotyping performed.  Uterus is felt to be normal size, shape, and contour.  No adnexal masses or tenderness noted.Bladder is non tender, no masses felt. Rectal: Good sphincter tone, no polyps, or hemorrhoids felt.  Hemoccult negative. Extremities/musculoskeletal:  No swelling or varicosities noted, no clubbing or cyanosis Psych:  No mood changes, alert and cooperative,seems happy AA is 0 Fall risk is moderate    03/08/2023    1:57 PM 02/16/2023    2:07 PM 02/16/2023    2:00 PM  Depression  screen PHQ 2/9  Decreased Interest 1 2 0  Down, Depressed, Hopeless 1 2 0  PHQ - 2 Score 2 4 0  Altered sleeping 0 1   Tired, decreased energy 0 1   Change in appetite 0 1   Feeling bad or failure about yourself  0 1   Trouble concentrating 0 0   Moving slowly or fidgety/restless 0 0   Suicidal thoughts 0 0   PHQ-9 Score 2 8   Difficult doing work/chores  Not difficult at all        03/08/2023    1:57 PM 02/16/2023    2:07 PM 01/18/2023    2:13 PM 12/23/2022    2:25 PM  GAD 7 : Generalized Anxiety Score  Nervous, Anxious, on Edge 0 2 1 1   Control/stop worrying 0 1 1 0  Worry too much - different things 0 2 1 0  Trouble relaxing 1 2 1 2   Restless 0 1 0 0  Easily annoyed or irritable 1 1 1 1   Afraid - awful might happen 0 0 0 0  Total GAD 7 Score 2 9 5 4   Anxiety Difficulty  Not difficult at all Not difficult at all Not difficult at all      Upstream - 03/08/23 1408       Pregnancy Intention Screening   Does the patient want to become pregnant in the next year? No    Does the patient's partner want to become pregnant in the next year? No  Would the patient like to discuss contraceptive options today? No      Contraception Wrap Up   Current Method Female Sterilization    End Method Female Sterilization            Examination chaperoned by Malachy Mood LPN  Impression and Plan: 1. Encounter for gynecological examination with Papanicolaou smear of cervix Pap sent Pap in 3 years if normal GYN physical in 1 year Labs with PCP Mammogram was negative 01/30/23 Colonoscopy per GI Stay active   - Cytology - PAP( )  2. Encounter for screening fecal occult blood testing Hemoccult was negative - POCT occult blood stool

## 2023-03-10 LAB — CYTOLOGY - PAP
Adequacy: ABSENT
Comment: NEGATIVE
Diagnosis: NEGATIVE
High risk HPV: NEGATIVE

## 2023-04-07 ENCOUNTER — Other Ambulatory Visit: Payer: Self-pay | Admitting: Family Medicine

## 2023-04-13 ENCOUNTER — Ambulatory Visit: Payer: Medicare HMO | Admitting: Nutrition

## 2023-04-14 ENCOUNTER — Other Ambulatory Visit: Payer: Self-pay | Admitting: Family Medicine

## 2023-04-14 DIAGNOSIS — F331 Major depressive disorder, recurrent, moderate: Secondary | ICD-10-CM

## 2023-04-18 ENCOUNTER — Ambulatory Visit: Payer: Medicare HMO | Admitting: Pulmonary Disease

## 2023-04-20 ENCOUNTER — Ambulatory Visit: Payer: Medicare HMO | Admitting: Family Medicine

## 2023-04-20 ENCOUNTER — Encounter: Payer: Self-pay | Admitting: Family Medicine

## 2023-04-20 VITALS — BP 176/96 | HR 83 | Temp 97.4°F | Ht 64.0 in | Wt 348.6 lb

## 2023-04-20 DIAGNOSIS — J029 Acute pharyngitis, unspecified: Secondary | ICD-10-CM | POA: Diagnosis not present

## 2023-04-20 DIAGNOSIS — J069 Acute upper respiratory infection, unspecified: Secondary | ICD-10-CM

## 2023-04-20 DIAGNOSIS — H6593 Unspecified nonsuppurative otitis media, bilateral: Secondary | ICD-10-CM | POA: Diagnosis not present

## 2023-04-20 DIAGNOSIS — I10 Essential (primary) hypertension: Secondary | ICD-10-CM

## 2023-04-20 DIAGNOSIS — R062 Wheezing: Secondary | ICD-10-CM | POA: Diagnosis not present

## 2023-04-20 LAB — CULTURE, GROUP A STREP

## 2023-04-20 LAB — RAPID STREP SCREEN (MED CTR MEBANE ONLY): Strep Gp A Ag, IA W/Reflex: NEGATIVE

## 2023-04-20 MED ORDER — METHYLPREDNISOLONE ACETATE 80 MG/ML IJ SUSP
80.0000 mg | Freq: Once | INTRAMUSCULAR | Status: AC
Start: 2023-04-20 — End: 2023-04-20
  Administered 2023-04-20: 80 mg via INTRAMUSCULAR

## 2023-04-20 MED ORDER — FLUTICASONE PROPIONATE 50 MCG/ACT NA SUSP
2.0000 | Freq: Every day | NASAL | 6 refills | Status: DC
Start: 2023-04-20 — End: 2023-10-23

## 2023-04-20 MED ORDER — BENZONATATE 100 MG PO CAPS
200.0000 mg | ORAL_CAPSULE | Freq: Three times a day (TID) | ORAL | 0 refills | Status: AC | PRN
Start: 2023-04-20 — End: ?

## 2023-04-20 NOTE — Progress Notes (Signed)
Acute Office Visit  Subjective:     Patient ID: [name], female    DOB: 1960/11/27, 62 y.o.   MRN: [id number]  Chief Complaint  Patient presents with   Sore Throat   Headache   Cough   Nasal Congestion    X 4 days     Sore Throat  This is a new problem. Episode onset: 3 days ago. The problem has been unchanged. There has been no fever. The pain is moderate. Associated symptoms include congestion, coughing, ear pain, headaches and a plugged ear sensation. Pertinent negatives include no abdominal pain, diarrhea, drooling, ear discharge, hoarse voice, neck pain, shortness of breath, stridor, swollen glands, trouble swallowing or vomiting. She has had exposure to strep. She has tried acetaminophen for the symptoms. The treatment provided mild relief.  Headache  This is a new problem. Episode onset: 3 days ago. The problem occurs constantly. The pain is located in the Frontal region. The pain does not radiate. The quality of the pain is described as aching. The pain is mild. Associated symptoms include coughing, drainage, ear pain, rhinorrhea, sinus pressure and a sore throat. Pertinent negatives include no abdominal pain, abnormal behavior, anorexia, back pain, blurred vision, dizziness, eye pain, eye redness, eye watering, facial sweating, fever, hearing loss, insomnia, loss of balance, muscle aches, nausea, neck pain, numbness, phonophobia, photophobia, scalp tenderness, seizures, swollen glands, tingling, tinnitus, visual change, vomiting, weakness or weight loss. Nothing aggravates the symptoms. She has tried acetaminophen for the symptoms. The treatment provided no relief.  Cough This is a new problem. Episode onset: 3 days ago. The problem has been waxing and waning. The problem occurs constantly. The cough is Non-productive. Associated symptoms include ear congestion, ear pain, headaches, nasal congestion, postnasal drip, rhinorrhea, a sore throat and wheezing. Pertinent negatives  include no chest pain, chills, eye redness, fever, heartburn, hemoptysis, myalgias, rash, shortness of breath, sweats or weight loss. She has tried a beta-agonist inhaler for the symptoms. The treatment provided no relief. Her past medical history is significant for asthma.    Review of Systems  Constitutional:  Negative for chills, diaphoresis, fever, malaise/fatigue and weight loss.  HENT:  Positive for congestion, ear pain, postnasal drip, rhinorrhea, sinus pressure and sore throat. Negative for drooling, ear discharge, hearing loss, hoarse voice, nosebleeds, sinus pain, tinnitus and trouble swallowing.   Eyes:  Negative for blurred vision, photophobia, pain and redness.  Respiratory:  Positive for cough and wheezing. Negative for hemoptysis, shortness of breath and stridor.   Cardiovascular:  Negative for chest pain, palpitations, orthopnea, claudication, leg swelling and PND.  Gastrointestinal:  Negative for abdominal pain, anorexia, blood in stool, constipation, diarrhea, heartburn, nausea and vomiting.  Musculoskeletal:  Negative for back pain, myalgias and neck pain.  Skin:  Negative for itching and rash.  Neurological:  Positive for headaches. Negative for dizziness, tingling, tremors, sensory change, speech change, focal weakness, seizures, loss of consciousness, weakness, numbness and loss of balance.  Psychiatric/Behavioral:  The patient does not have insomnia.   All other systems reviewed and are negative.       Objective:    BP (!) 176/96   Pulse 83   Temp (!) 97.4 F (36.3 C) (Temporal)   Ht 5\' 4"  (1.626 m)   Wt (!) 348 lb 9.6 oz (158.1 kg)   SpO2 91%   BMI 59.84 kg/m  BP Readings from Last 3 Encounters:  04/20/23 (!) 176/96  03/08/23 136/74  02/16/23 133/77   Wt Readings  from Last 3 Encounters:  04/20/23 (!) 348 lb 9.6 oz (158.1 kg)  03/08/23 (!) 346 lb (156.9 kg)  02/16/23 (!) 350 lb 3.2 oz (158.8 kg)   SpO2 Readings from Last 3 Encounters:  04/20/23 91%   01/18/23 92%  01/17/23 98%      Physical Exam Vitals and nursing note reviewed.  Constitutional:      General: She is not in acute distress.    Appearance: Normal appearance. She is well-developed and well-groomed. She is obese. She is ill-appearing. She is not toxic-appearing or diaphoretic.  HENT:     Head: Normocephalic and atraumatic.     Jaw: There is normal jaw occlusion.     Right Ear: Hearing and ear canal normal. No drainage, swelling or tenderness. A middle ear effusion is present. Tympanic membrane is not erythematous.     Left Ear: Hearing and ear canal normal. No drainage, swelling or tenderness. A middle ear effusion is present. Tympanic membrane is not erythematous.     Nose: Congestion and rhinorrhea present.     Mouth/Throat:     Lips: Pink.     Mouth: Mucous membranes are moist. No oral lesions.     Pharynx: Oropharynx is clear. Uvula midline. Posterior oropharyngeal erythema present. No pharyngeal swelling, oropharyngeal exudate or uvula swelling.  Eyes:     General: Lids are normal.     Extraocular Movements: Extraocular movements intact.     Conjunctiva/sclera: Conjunctivae normal.     Pupils: Pupils are equal, round, and reactive to light.  Neck:     Thyroid: No thyroid mass, thyromegaly or thyroid tenderness.     Vascular: No carotid bruit or JVD.     Trachea: Trachea and phonation normal.  Cardiovascular:     Rate and Rhythm: Normal rate and regular rhythm.     Chest Wall: PMI is not displaced.     Pulses: Normal pulses.     Heart sounds: Normal heart sounds. No murmur heard.    No friction rub. No gallop.  Pulmonary:     Effort: Pulmonary effort is normal. No respiratory distress.     Breath sounds: No stridor. Wheezing present. No rhonchi or rales.  Chest:     Chest wall: No tenderness.  Abdominal:     General: Bowel sounds are normal. There is no distension or abdominal bruit.     Palpations: Abdomen is soft. There is no hepatomegaly or  splenomegaly.     Tenderness: There is no abdominal tenderness. There is no right CVA tenderness or left CVA tenderness.     Hernia: No hernia is present.  Musculoskeletal:        General: Normal range of motion.     Cervical back: Normal range of motion and neck supple.     Right lower leg: No edema.     Left lower leg: No edema.  Lymphadenopathy:     Cervical: No cervical adenopathy.  Skin:    General: Skin is warm and dry.     Capillary Refill: Capillary refill takes less than 2 seconds.     Coloration: Skin is not cyanotic, jaundiced or pale.     Findings: No rash.  Neurological:     General: No focal deficit present.     Mental Status: She is alert and oriented to person, place, and time.     Sensory: Sensation is intact.     Motor: Motor function is intact.     Coordination: Coordination is intact.  Gait: Gait is intact.     Deep Tendon Reflexes: Reflexes are normal and symmetric.  Psychiatric:        Attention and Perception: Attention and perception normal.        Mood and Affect: Mood and affect normal.        Speech: Speech normal.        Behavior: Behavior normal. Behavior is cooperative.        Thought Content: Thought content normal.        Cognition and Memory: Cognition and memory normal.        Judgment: Judgment normal.     Assessment & Plan:   Problem List Items Addressed This Visit       Cardiovascular and Mediastinum   Essential hypertension, benign  Has not taken medications today. Aware to take once home. Follow up for PCP for reevaluation.   Other Visit Diagnoses     URI with cough and congestion    -  Primary Concerning for COVID. Strep negative in office. Will start antivirals if COVID is positive. Symptomatic care discussed in detail. Wheezing in office, bursted with IM steroids. Pt aware of symptomatic care at home. Can take Coricidin HBP for symptoms control. Report new, worsening, or persistent symptoms.    Relevant Medications    benzonatate (TESSALON PERLES) 100 MG capsule   fluticasone (FLONASE) 50 MCG/ACT nasal spray   methylPREDNISolone acetate (DEPO-MEDROL) injection 80 mg (Completed)   Sore throat       Strep negative in office. Continue tylenol at home for symptom control.    COVID-19, Flu A+B and RSV   Rapid Strep Screen ([facility] ONLY)   Wheezing     Bursted with steroids in office. Aware to continue SABA as prescribed.    Relevant Medications   methylPREDNISolone acetate (DEPO-MEDROL) injection 80 mg (Completed)   Fluid level behind tympanic membrane of both ears     Start flonase as prescribed. Aware to report new, worsening, or persistent symptoms.    Relevant Medications   fluticasone (FLONASE) 50 MCG/ACT nasal spray       Meds ordered this encounter  Medications   benzonatate (TESSALON PERLES) 100 MG capsule    Sig: Take 2 capsules (200 mg total) by mouth 3 (three) times daily as needed for cough.    Dispense:  20 capsule    Refill:  0    Order Specific Question:   Supervising Provider    Answer:   [name] [[id number]]   fluticasone (FLONASE) 50 MCG/ACT nasal spray    Sig: Place 2 sprays into both nostrils daily.    Dispense:  16 g    Refill:  6    Order Specific Question:   Supervising Provider    Answer:   [name] [[id number]]   methylPREDNISolone acetate (DEPO-MEDROL) injection 80 mg    Return if symptoms worsen or fail to improve.  Kari Baars, FNP

## 2023-04-21 ENCOUNTER — Encounter: Payer: Self-pay | Admitting: Family Medicine

## 2023-04-21 LAB — COVID-19, FLU A+B AND RSV
Influenza A, NAA: NOT DETECTED
Influenza B, NAA: NOT DETECTED
RSV, NAA: NOT DETECTED
SARS-CoV-2, NAA: DETECTED — AB

## 2023-04-23 ENCOUNTER — Encounter: Payer: Self-pay | Admitting: Family Medicine

## 2023-04-23 ENCOUNTER — Other Ambulatory Visit: Payer: Self-pay | Admitting: Family Medicine

## 2023-04-23 DIAGNOSIS — J069 Acute upper respiratory infection, unspecified: Secondary | ICD-10-CM

## 2023-04-23 MED ORDER — NIRMATRELVIR/RITONAVIR (PAXLOVID)TABLET: 3.0000 | ORAL_TABLET | Freq: Two times a day (BID) | ORAL | 0 refills | Status: AC

## 2023-04-24 ENCOUNTER — Ambulatory Visit (INDEPENDENT_AMBULATORY_CARE_PROVIDER_SITE_OTHER): Payer: Medicare HMO | Admitting: Family Medicine

## 2023-04-24 ENCOUNTER — Encounter: Payer: Self-pay | Admitting: Family Medicine

## 2023-04-24 VITALS — BP 164/86 | HR 69 | Temp 97.5°F | Ht 64.0 in | Wt 341.4 lb

## 2023-04-24 DIAGNOSIS — U071 COVID-19: Secondary | ICD-10-CM | POA: Diagnosis not present

## 2023-04-24 DIAGNOSIS — E119 Type 2 diabetes mellitus without complications: Secondary | ICD-10-CM | POA: Diagnosis not present

## 2023-04-24 DIAGNOSIS — Z7984 Long term (current) use of oral hypoglycemic drugs: Secondary | ICD-10-CM | POA: Diagnosis not present

## 2023-04-24 DIAGNOSIS — I10 Essential (primary) hypertension: Secondary | ICD-10-CM | POA: Diagnosis not present

## 2023-04-24 DIAGNOSIS — E039 Hypothyroidism, unspecified: Secondary | ICD-10-CM | POA: Diagnosis not present

## 2023-04-24 DIAGNOSIS — E7800 Pure hypercholesterolemia, unspecified: Secondary | ICD-10-CM

## 2023-04-24 LAB — BAYER DCA HB A1C WAIVED: HB A1C (BAYER DCA - WAIVED): 5.9 % — ABNORMAL HIGH (ref 4.8–5.6)

## 2023-04-24 MED ORDER — BENZONATATE 200 MG PO CAPS: 200.0000 mg | ORAL_CAPSULE | Freq: Three times a day (TID) | ORAL | 0 refills | Status: DC | PRN

## 2023-04-24 NOTE — Progress Notes (Signed)
Subjective:  Patient ID: [name], female    DOB: 04-Jul-1961  Age: 62 y.o. MRN: [id number]  CC: Diabetes   HPI [name] presents forFollow-up of diabetes. Patient checks blood sugar at home.   119-124 fasting and 80-120  postprandial Patient denies symptoms such as polyuria, polydipsia, excessive hunger, nausea No significant hypoglycemic spells noted. Rarely down to 64 if she doesn't eat.  Medications reviewed. Pt reports taking them regularly without complication/adverse reaction being reported today.   A little cough persists for Covid Dx 4 days ago. Just found out yesterday. Already finished off the benzonatate   follow-up on  thyroid. The patient has a history of hypothyroidism for many years. It has been stable recently. Pt. denies any change in  voice, loss of hair, heat or cold intolerance. Energy level has been adequate to good. Patient denies constipation and diarrhea. No myxedema. Medication is as noted below. Verified that pt is taking it daily on an empty stomach. Well tolerated.   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   She has a past surgical history that includes Endometrial ablation w/ novasure; Carpal tunnel release (2007); trigger finger release surgeyr ; Tubal ligation; Knee arthroscopy; Knee arthroscopy with medial menisectomy (Right, 10/22/2013); Back surgery (2012); Colonoscopy; Excision  metacarpal mass (Right, 03/10/2014); Total knee arthroplasty (Left, 04/16/2014); Irrigation and debridement knee (Left, 05/23/2014); Total knee arthroplasty (Right, 12/23/2014); and Excisional total knee arthroplasty with antibiotic spacers (Left, 09/19/2019).   Her family history includes Alcohol abuse in her paternal grandfather; Arthritis in her mother; Breast cancer in her maternal grandmother; Cancer in her brother, daughter, and maternal grandmother; Gout in her father; Heart attack in her maternal grandfather; Heart disease in her mother; Heart disease (age of onset: 44) in her father; Hyperlipidemia in her father; Hypertension in her father and sister; Tuberculosis in her paternal grandfather.She reports that she has never smoked. She has never used smokeless tobacco. She reports that she does not drink alcohol and does not use drugs.  Current Outpatient Medications on File Prior to Visit  Medication Sig Dispense Refill   albuterol (VENTOLIN HFA) 108 (90 Base) MCG/ACT inhaler Inhale 1-2 puffs into the lungs every 6 (six) hours as needed for wheezing or shortness of breath. 1 each 11   Blood Glucose Monitoring Suppl DEVI 1 each by Does not apply route in the morning, at noon, and at bedtime. May substitute to any manufacturer covered by patient's insurance. 1 each 0   Budeson-Glycopyrrol-Formoterol (BREZTRI AEROSPHERE) 160-9-4.8 MCG/ACT AERO Inhale 2 puffs into the lungs in the morning and at bedtime. 2 each 0   fluocinonide cream (LIDEX) 0.05 % Apply 1 Application topically 2 (two) times daily. 30 g 5   fluticasone (FLONASE) 50 MCG/ACT nasal spray Place 2 sprays into both nostrils daily. 16 g 6   furosemide (LASIX) 40 MG tablet TAKE 1 TABLET BY MOUTH EVERY DAY 90 tablet 0   gabapentin (NEURONTIN) 300 MG capsule Take 1 capsule by mouth 2 (two) times daily.     Glucose Blood (BLOOD GLUCOSE TEST STRIPS) STRP Check BS  in the morning, at noon and at bedtime Dx R73.03 300 strip 3   Lancets (ONETOUCH  DELICA PLUS LANCET33G) MISC Check BS in morning, at noon and at bedtime Dx R73.03 300 each 3   levothyroxine (SYNTHROID) 100 MCG tablet TAKE 1 TABLET BY MOUTH EVERY DAY BEFORE BREAKFAST 90 tablet 3   losartan (COZAAR) 50 MG tablet Take 1 tablet (50 mg total) by mouth daily. 90 tablet 3   metFORMIN (GLUCOPHAGE-XR) 500 MG 24 hr tablet TAKE 1 TABLET BY MOUTH EVERY DAY WITH BREAKFAST 90 tablet 0   methocarbamol (ROBAXIN) 500 MG tablet Take 500 mg by mouth every 6 (six) hours as needed for muscle spasms.     montelukast (SINGULAIR) 10 MG tablet TAKE 1 TABLET BY MOUTH EVERYDAY AT BEDTIME 90 tablet 1   nabumetone (RELAFEN) 500 MG tablet Take 2 tablets (1,000 mg total) by mouth 2 (two) times daily. For muscle and joint pain 360 tablet 0   nirmatrelvir/ritonavir (PAXLOVID) 20 x 150 MG & 10 x 100MG  TABS Take 3 tablets by mouth 2 (two) times daily for 5 days. (Take nirmatrelvir 150 mg two tablets twice daily for 5 days and ritonavir 100 mg one tablet twice daily for 5 days) Patient GFR is 50 30 tablet 0   omeprazole (PRILOSEC) 40 MG capsule TAKE 1 CAPSULE BY MOUTH EVERY DAY 90 capsule 3   promethazine (PHENERGAN) 25 MG tablet TAKE 1 TABLET BY MOUTH EVERY 6 HOURS AS NEEDED FOR NAUSEA AND VOMITING 90 tablet 5   rosuvastatin (CRESTOR) 40 MG tablet TAKE 1 TABLET BY MOUTH EVERY DAY FOR CHOLESTEROL 90 tablet 0   sertraline (ZOLOFT) 50 MG tablet TAKE 1 TABLET BY MOUTH EVERYDAY AT BEDTIME 90 tablet 0   traZODone (DESYREL) 100 MG tablet TAKE 3 TABLETS (300MG ) BY MOUTH EVERY DAY AT BEDTIME 270 tablet 3   VITAMIN D PO Take by mouth.     No current facility-administered medications on file prior to visit.    ROS Review of Systems  Constitutional: Negative.   HENT: Negative.    Eyes:  Negative for visual disturbance.  Respiratory:  Positive for cough. Negative for shortness of breath.   Cardiovascular:  Negative for chest pain.  Gastrointestinal:  Negative for abdominal pain.  Musculoskeletal:  Negative for  arthralgias.    Objective:  BP (!) 164/86   Pulse 69   Temp (!) 97.5 F (36.4 C)   Ht 5\' 4"  (1.626 m)   Wt (!) 341 lb 6.4 oz (154.9 kg)   SpO2 94%   BMI 58.60 kg/m   BP Readings from Last 3 Encounters:  04/24/23 (!) 164/86  04/20/23 (!) 176/96  03/08/23 136/74    Wt Readings from Last 3 Encounters:  04/24/23 (!) 341 lb 6.4 oz (154.9 kg)  04/20/23 (!) 348 lb 9.6 oz (158.1 kg)  03/08/23 (!) 346 lb (156.9 kg)     Physical Exam Constitutional:      General: She is not in acute distress.    Appearance: She is well-developed.  Cardiovascular:     Rate and Rhythm: Normal rate and regular rhythm.  Pulmonary:     Breath sounds: Normal breath sounds.  Musculoskeletal:        General: Normal range of motion.  Skin:    General: Skin is warm and dry.  Neurological:     Mental Status: She is alert and oriented to person, place, and time.       Assessment & Plan:   Erva was seen today  for diabetes.  Diagnoses and all orders for this visit:  Essential hypertension, benign -     CBC with Differential/Platelet -     CMP14+EGFR  Diabetes mellitus, new onset (HCC) -     Bayer DCA Hb A1c Waived -     Microalbumin / creatinine urine ratio  High cholesterol -     Lipid panel  Hypothyroidism, unspecified type -     TSH + free T4  COVID-19 virus infection  Other orders -     benzonatate (TESSALON) 200 MG capsule; Take 1 capsule (200 mg total) by mouth 3 (three) times daily as needed for cough.      I have discontinued Ariebella Reo. Chestang's benzonatate. I am also having her start on benzonatate. Additionally, I am having her maintain her methocarbamol, gabapentin, omeprazole, traZODone, losartan, albuterol, Breztri Aerosphere, montelukast, promethazine, levothyroxine, fluocinonide cream, nabumetone, Blood Glucose Monitoring Suppl, metFORMIN, furosemide, BLOOD GLUCOSE TEST STRIPS, OneTouch Delica Plus Lancet33G, VITAMIN D PO, rosuvastatin, sertraline, fluticasone, and  nirmatrelvir/ritonavir.  Meds ordered this encounter  Medications   benzonatate (TESSALON) 200 MG capsule    Sig: Take 1 capsule (200 mg total) by mouth 3 (three) times daily as needed for cough.    Dispense:  20 capsule    Refill:  0   Meds as is. Continue weight loss efforts.   Follow-up: Return in about 3 months (around 07/24/2023).  [name], M.D.

## 2023-04-25 LAB — CBC WITH DIFFERENTIAL/PLATELET
Basophils Absolute: 0 10*3/uL (ref 0.0–0.2)
Basos: 0 %
EOS (ABSOLUTE): 0.3 10*3/uL (ref 0.0–0.4)
Eos: 4 %
Hematocrit: 42.1 % (ref 34.0–46.6)
Hemoglobin: 13.4 g/dL (ref 11.1–15.9)
Immature Grans (Abs): 0 10*3/uL (ref 0.0–0.1)
Immature Granulocytes: 0 %
Lymphocytes Absolute: 1.8 10*3/uL (ref 0.7–3.1)
Lymphs: 26 %
MCH: 32.1 pg (ref 26.6–33.0)
MCHC: 31.8 g/dL (ref 31.5–35.7)
MCV: 101 fL — ABNORMAL HIGH (ref 79–97)
Monocytes Absolute: 0.6 10*3/uL (ref 0.1–0.9)
Monocytes: 8 %
Neutrophils Absolute: 4.3 10*3/uL (ref 1.4–7.0)
Neutrophils: 62 %
Platelets: 177 10*3/uL (ref 150–450)
RBC: 4.18 x10E6/uL (ref 3.77–5.28)
RDW: 13.9 % (ref 11.7–15.4)
WBC: 6.9 10*3/uL (ref 3.4–10.8)

## 2023-04-25 LAB — CMP14+EGFR
ALT: 21 [IU]/L (ref 0–32)
AST: 24 [IU]/L (ref 0–40)
Albumin: 4.6 g/dL (ref 3.8–4.9)
Alkaline Phosphatase: 120 [IU]/L (ref 44–121)
BUN/Creatinine Ratio: 18 (ref 12–28)
BUN: 19 mg/dL (ref 8–27)
Bilirubin Total: 0.3 mg/dL (ref 0.0–1.2)
CO2: 29 mmol/L (ref 20–29)
Calcium: 9.6 mg/dL (ref 8.7–10.3)
Chloride: 101 mmol/L (ref 96–106)
Creatinine, Ser: 1.03 mg/dL — ABNORMAL HIGH (ref 0.57–1.00)
Globulin, Total: 2.7 g/dL (ref 1.5–4.5)
Glucose: 91 mg/dL (ref 70–99)
Potassium: 4.8 mmol/L (ref 3.5–5.2)
Sodium: 141 mmol/L (ref 134–144)
Total Protein: 7.3 g/dL (ref 6.0–8.5)
eGFR: 62 mL/min/{1.73_m2} (ref >59)

## 2023-04-25 LAB — LIPID PANEL
Chol/HDL Ratio: 3.7 {ratio} (ref 0.0–4.4)
Cholesterol, Total: 127 mg/dL (ref 100–199)
HDL: 34 mg/dL — ABNORMAL LOW (ref >39)
LDL Chol Calc (NIH): 67 mg/dL (ref 0–99)
Triglycerides: 151 mg/dL — ABNORMAL HIGH (ref 0–149)
VLDL Cholesterol Cal: 26 mg/dL (ref 5–40)

## 2023-04-25 LAB — TSH+FREE T4
Free T4: 1.27 ng/dL (ref 0.82–1.77)
TSH: 2.06 u[IU]/mL (ref 0.450–4.500)

## 2023-04-25 NOTE — Progress Notes (Signed)
Hello [place],  Your lab result is normal and/or stable.Some minor variations that are not significant are commonly marked abnormal, but do not represent any medical problem for you.  Best regards, Arvada Seaborn, M.D.

## 2023-05-02 ENCOUNTER — Other Ambulatory Visit: Payer: Self-pay | Admitting: Family Medicine

## 2023-05-03 ENCOUNTER — Encounter: Payer: Self-pay | Admitting: Family Medicine

## 2023-05-08 MED ORDER — BENZONATATE 200 MG PO CAPS: 200.0000 mg | ORAL_CAPSULE | Freq: Three times a day (TID) | ORAL | 0 refills | Status: DC | PRN

## 2023-05-12 ENCOUNTER — Ambulatory Visit (INDEPENDENT_AMBULATORY_CARE_PROVIDER_SITE_OTHER): Payer: Medicare HMO | Admitting: Family Medicine

## 2023-05-12 VITALS — BP 142/80 | HR 94 | Temp 97.5°F | Ht 64.0 in | Wt 339.6 lb

## 2023-05-12 DIAGNOSIS — N3001 Acute cystitis with hematuria: Secondary | ICD-10-CM | POA: Diagnosis not present

## 2023-05-12 DIAGNOSIS — R300 Dysuria: Secondary | ICD-10-CM

## 2023-05-12 DIAGNOSIS — L249 Irritant contact dermatitis, unspecified cause: Secondary | ICD-10-CM

## 2023-05-12 LAB — URINALYSIS, ROUTINE W REFLEX MICROSCOPIC
Bilirubin, UA: NEGATIVE
Glucose, UA: NEGATIVE
Ketones, UA: NEGATIVE
Nitrite, UA: NEGATIVE
Specific Gravity, UA: >1.03 — ABNORMAL HIGH (ref 1.005–1.030)
Urobilinogen, Ur: 0.2 mg/dL (ref 0.2–1.0)
pH, UA: 5.5 (ref 5.0–7.5)

## 2023-05-12 LAB — MICROSCOPIC EXAMINATION
RBC, Urine: >30 /[HPF] — AB (ref 0–2)
Renal Epithel, UA: NONE SEEN /[HPF]
WBC, UA: >30 /[HPF] — AB (ref 0–5)
Yeast, UA: NONE SEEN

## 2023-05-12 MED ORDER — TRIAMCINOLONE ACETONIDE 0.1 % EX CREA
1.0000 | TOPICAL_CREAM | Freq: Two times a day (BID) | CUTANEOUS | 0 refills | Status: DC
Start: 2023-05-12 — End: 2023-10-23

## 2023-05-12 MED ORDER — SULFAMETHOXAZOLE-TRIMETHOPRIM 800-160 MG PO TABS
1.0000 | ORAL_TABLET | Freq: Two times a day (BID) | ORAL | 0 refills | Status: AC
Start: 2023-05-12 — End: 2023-05-19

## 2023-05-12 NOTE — Progress Notes (Signed)
Subjective:  Patient ID: [name], female    DOB: 1960-09-21, 62 y.o.   MRN: [id number]  Patient Care Team: [name], MD as PCP - General (Family Medicine) Cottle, Steva Ready., MD as Attending Physician (Psychiatry) Ranee Gosselin, MD as Consulting Physician (Orthopedic Surgery)   Chief Complaint:  Dysuria (X 1 week on and off )   HPI: [name] is a 62 y.o. female presenting on 05/12/2023 for Dysuria (X 1 week on and off )   Discussed the use of AI scribe software for clinical note transcription with the patient, who gave verbal consent to proceed.  History of Present Illness   The patient, known to have hypertension, presents with a one-week history of dysuria, described as a burning sensation in the urogenital area. She reports a significant increase in urinary frequency, with a sense of urgency leading to occasional incontinence. Despite the increased frequency, the patient notes that the volume of urine passed is often minimal, except for the first void in the morning which is described as 'normal.' The urine is reported to be cloudy, but there is no associated hematuria, fever, chills, nausea, vomiting, confusion, or weakness. The patient has a history of urinary tract infections in the past.  In addition to the urinary symptoms, the patient also reports a two-week history of a skin rash, described as contact dermatitis, on one arm. 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 The patient denies any other new symptoms.          Relevant past medical, surgical, family, and social history reviewed and updated as indicated.  Allergies and medications reviewed and updated. Data reviewed: Chart in Epic.   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   Anxiety    ARF  (acute renal failure) (HCC) 07/16/2019   Arthritis    Asthma    Colon polyps    Complex tear of medial meniscus of right knee as current injury 10/22/2013   Depression    Diabetes mellitus without complication (HCC)    GERD (gastroesophageal reflux disease)    Headache    occasional headaches    High cholesterol    Hypertension    Lithium toxicity 06/2019   has since resolved after discontinuing    PONV (postoperative nausea and vomiting)    Primary insomnia 08/20/2019   Shortness of breath dyspnea    due to weight gain    Sleep apnea    has not use CPAP machine in 2 yrs/ DOES NOT KNOW IF SHE NEEDS TO USE C - PAP   Syncope and collapse    due to severe knee pain    Total knee replacement status 04/16/2014    Past Surgical History:  Procedure Laterality Date   BACK SURGERY  2012   lumb fusion   CARPAL TUNNEL RELEASE  2007   both hands   COLONOSCOPY     ENDOMETRIAL ABLATION W/ NOVASURE     EXCISION METACARPAL MASS Right 03/10/2014   Procedure: EXCISION MASS RIGHT HAND FIRST WEB SPACE DORSAL PALMAR INCISION ;  Surgeon: Cindee Salt, MD;  Location: [facility];  Service: Orthopedics;  Laterality: Right;   EXCISIONAL TOTAL KNEE ARTHROPLASTY WITH ANTIBIOTIC SPACERS Left 09/19/2019   Procedure: EXCISIONAL  LEFT TOTAL KNEE ARTHROPLASTY  WITH TOTAL KNEE REVISION;  Surgeon: Durene Romans, MD;  Location: [facility];  Service: Orthopedics;  Laterality: Left;  2 hrs   IRRIGATION AND DEBRIDEMENT KNEE Left 05/23/2014   Procedure: IRRIGATION AND DEBRIDEMENT KNEE;  Surgeon: [name], MD;  Location: [facility];  Service: Orthopedics;  Laterality: Left;  With POLYETHYLENE EXCHANGE   KNEE ARTHROSCOPY     let knee 03/2013    KNEE ARTHROSCOPY WITH MEDIAL MENISECTOMY Right 10/22/2013   Procedure: RIGHT KNEE ARTHROSCOPY WITH MEDIAL MENISECTOMY, abrasion chrondrplasty Brigid Re OUT/CORRECT CURETTEMENT/BONE GRAFT/PROXIMAL TIBIA ;  Surgeon: Jacki Cones, MD;  Location: [facility];  Service:  Orthopedics;  Laterality: Right;   TOTAL KNEE ARTHROPLASTY Left 04/16/2014   Procedure: LEFT TOTAL KNEE ARTHROPLASTY;  Surgeon: Jacki Cones, MD;  Location: [facility];  Service: Orthopedics;  Laterality: Left;   TOTAL KNEE ARTHROPLASTY Right 12/23/2014   Procedure: TOTAL KNEE ARTHROPLASTY;  Surgeon: Ranee Gosselin, MD;  Location: [facility];  Service: Orthopedics;  Laterality: Right;   trigger finger release surgeyr      bilateral    TUBAL LIGATION      Social History   Socioeconomic History   Marital status: Married    Spouse name: Trey Paula   Number of children: 2   Years of education: 12   Highest education level: Some college, no degree  Occupational History    Comment: Disabled  Tobacco Use   Smoking status: Never   Smokeless tobacco: Never  Vaping Use   Vaping status: Never Used  Substance and Sexual Activity   Alcohol use: No   Drug use: No   Sexual activity: Yes    Birth control/protection: Surgical    Comment: tubal & ablation  Other Topics Concern   Not on file  Social History Narrative   Lives with her husband who helps with grocery shopping and other transportation needs.     Has two children, Billey Gosling and Clydie Braun and grandchildren - they all live nearby.   There are two outside dogs and several  Horses.    Social Determinants of Health   Financial Resource Strain: Low Risk  (05/12/2023)   Overall Financial Resource Strain (CARDIA)    Difficulty of Paying Living Expenses: Not hard at all  Food Insecurity: No Food Insecurity (05/12/2023)   Hunger Vital Sign    Worried About Running Out of Food in the Last Year: Never true    Ran Out of Food in the Last Year: Never true  Transportation Needs: No Transportation Needs (05/12/2023)   PRAPARE - Administrator, Civil Service (Medical): No    Lack of Transportation (Non-Medical): No  Physical Activity: Inactive (05/12/2023)   Exercise Vital Sign    Days of Exercise per Week: 0 days    Minutes of Exercise per  Session: 0 min  Stress: No Stress Concern Present (05/12/2023)   [employer] of Occupational Health - Occupational Stress Questionnaire    Feeling of Stress : Only a little  Social Connections: Socially Integrated (05/12/2023)   Social Connection and Isolation Panel [NHANES]    Frequency of Communication with Friends and Family: More than three times a week    Frequency of Social Gatherings with Friends and Family: More than three times a week    Attends Religious Services: More than 4 times per year    Active Member of [employer] or Organizations: Yes    Attends Engineer, structural: More than 4 times per year    Marital Status: Married  Intimate Partner Violence: Not At Risk (03/08/2023)   Humiliation, Afraid, Rape, and Kick questionnaire    Fear of Current or Ex-Partner: No    Emotionally Abused: No    Physically Abused: No    Sexually Abused: No    Outpatient Encounter Medications as of 05/12/2023  Medication Sig   albuterol (VENTOLIN HFA) 108 (90 Base) MCG/ACT inhaler Inhale 1-2 puffs into the lungs every 6 (six) hours as needed for wheezing or shortness of breath.   benzonatate (TESSALON) 200 MG capsule Take 1 capsule (200 mg total) by mouth 3 (three) times daily as needed for cough.   Blood Glucose Monitoring Suppl DEVI 1 each by Does not apply route in the morning, at noon, and at bedtime. May substitute to any manufacturer covered by patient's insurance.   Budeson-Glycopyrrol-Formoterol (BREZTRI AEROSPHERE) 160-9-4.8 MCG/ACT AERO Inhale 2 puffs into the lungs in the morning and at bedtime.   fluocinonide cream (LIDEX) 0.05 % Apply 1 Application topically 2 (two) times daily.   fluticasone (FLONASE) 50 MCG/ACT nasal spray Place 2 sprays into both nostrils daily.   furosemide (LASIX) 40 MG tablet TAKE 1 TABLET BY MOUTH EVERY DAY   gabapentin (NEURONTIN) 300 MG capsule Take 1 capsule by mouth 2 (two) times daily.   Glucose Blood (BLOOD GLUCOSE TEST STRIPS) STRP Check BS  in the morning, at noon and at bedtime Dx R73.03   Lancets (ONETOUCH DELICA PLUS LANCET33G) MISC Check BS in morning, at noon and at bedtime Dx R73.03   levothyroxine (SYNTHROID) 100 MCG tablet TAKE 1 TABLET BY MOUTH EVERY DAY BEFORE BREAKFAST   losartan (COZAAR) 50 MG tablet Take 1 tablet (50 mg total) by mouth daily.   metFORMIN (GLUCOPHAGE-XR) 500 MG 24 hr tablet TAKE 1 TABLET BY MOUTH EVERY DAY WITH BREAKFAST   methocarbamol (ROBAXIN) 500 MG tablet Take 500 mg by mouth every 6 (six) hours as needed for muscle spasms.   montelukast (SINGULAIR) 10 MG tablet TAKE 1 TABLET BY MOUTH EVERYDAY AT BEDTIME   nabumetone (RELAFEN) 500 MG tablet Take 2 tablets (1,000 mg total) by mouth 2 (two) times daily. For muscle and joint pain   omeprazole (PRILOSEC) 40 MG capsule TAKE 1 CAPSULE BY MOUTH EVERY DAY   promethazine (PHENERGAN) 25 MG tablet TAKE 1 TABLET BY MOUTH EVERY 6 HOURS AS NEEDED FOR NAUSEA AND VOMITING   rosuvastatin (CRESTOR) 40 MG tablet TAKE 1 TABLET BY MOUTH EVERY DAY FOR CHOLESTEROL   sertraline (ZOLOFT) 50 MG tablet TAKE 1 TABLET BY MOUTH EVERYDAY AT BEDTIME   sulfamethoxazole-trimethoprim (BACTRIM DS) 800-160 MG tablet Take 1 tablet by mouth 2 (two) times daily for 7 days.   traZODone (DESYREL) 100 MG tablet TAKE 3 TABLETS (300MG ) BY MOUTH EVERY DAY AT BEDTIME   triamcinolone cream (KENALOG) 0.1 % Apply 1 Application topically 2 (two) times daily.   VITAMIN D PO Take by mouth.   No facility-administered encounter medications on file as of 05/12/2023.    Allergies  Allergen Reactions   Chocolate Flavor     Other reaction(s): headache   Other Itching and Rash    Other reaction(s): other   Adhesive [Tape] Other (See Comments)    Peeled off skin   Ceclor [Cefaclor] Other (See Comments)    Blisters on hands   Chocolate Other (See Comments)    migraines   Venlafaxine Other (See Comments)    "refuses to take - makes me feel like I am dying"   Lisinopril Cough    Pertinent ROS  per HPI, otherwise unremarkable      Objective:  BP (!) 142/80 (BP Location: Right Arm, Cuff Size: Large)   Pulse 94   Temp (!) 97.5 F (36.4 C) (Temporal)   Ht 5\' 4"  (1.626 m)   Wt (!) 339 lb 9.6 oz (154 kg)   SpO2 91%   BMI 58.29 kg/m    Wt Readings from Last 3 Encounters:  05/12/23 (!) 339 lb 9.6 oz (154 kg)  04/24/23 (!) 341 lb 6.4 oz (154.9 kg)  04/20/23 (!) 348 lb 9.6 oz (158.1 kg)    Physical Exam Vitals and nursing note reviewed.  Constitutional:      General: She is not in acute distress.    Appearance: Normal appearance. She is obese. She is not ill-appearing, toxic-appearing or diaphoretic.  HENT:     Head: Normocephalic and atraumatic.     Nose: Nose normal.     Mouth/Throat:     Mouth: Mucous membranes are moist.  Eyes:     Conjunctiva/sclera: Conjunctivae normal.     Pupils: Pupils are equal, round, and reactive to light.  Cardiovascular:     Rate and Rhythm: Normal rate and regular rhythm.     Heart sounds: Normal heart sounds.  Pulmonary:     Effort: Pulmonary effort is normal.     Breath sounds: Normal breath sounds.  Abdominal:     General: Bowel sounds are normal. There is no distension.     Palpations: Abdomen is soft.     Tenderness: There is no abdominal tenderness. There is no right CVA tenderness or left CVA tenderness.  Musculoskeletal:     Cervical back: Neck supple.  Skin:    General: Skin is warm and dry.     Capillary Refill: Capillary refill takes less than 2 seconds.     Findings: Erythema and rash present. Rash is macular and papular.       Neurological:     General: No focal deficit present.     Mental Status: She is alert and oriented to person, place, and time.  Psychiatric:        Mood and Affect: Mood normal.        Behavior: Behavior normal.        Thought Content: Thought content normal.        Judgment: Judgment normal.    Physical Exam   VITALS: BP- 142/80 SKIN: Contact dermatitis on arm, rash present for two  weeks.        Results for orders placed or performed in visit on 04/24/23  Bayer DCA Hb A1c Waived  Result Value Ref Range   HB A1C (BAYER DCA - WAIVED) 5.9 (H) 4.8 - 5.6 %  CBC with Differential/Platelet  Result Value Ref Range   WBC 6.9 3.4 - 10.8 x10E3/uL   RBC 4.18 3.77 - 5.28 x10E6/uL   Hemoglobin 13.4 11.1 - 15.9 g/dL   Hematocrit 78.2 95.6 - 46.6 %   MCV 101 (H) 79 - 97 fL   MCH 32.1 26.6 - 33.0 pg   MCHC 31.8 31.5 - 35.7 g/dL   RDW 21.3 08.6 - 57.8 %   Platelets 177 150 - 450 x10E3/uL   Neutrophils 62 Not Estab. %   Lymphs 26 Not Estab. %   Monocytes 8 Not Estab. %   Eos 4 Not Estab. %   Basos 0 Not Estab. %   Neutrophils Absolute 4.3 1.4 - 7.0 x10E3/uL   Lymphocytes Absolute 1.8 0.7 - 3.1 x10E3/uL  Monocytes Absolute 0.6 0.1 - 0.9 x10E3/uL   EOS (ABSOLUTE) 0.3 0.0 - 0.4 x10E3/uL   Basophils Absolute 0.0 0.0 - 0.2 x10E3/uL   Immature Granulocytes 0 Not Estab. %   Immature Grans (Abs) 0.0 0.0 - 0.1 x10E3/uL  CMP14+EGFR  Result Value Ref Range   Glucose 91 70 - 99 mg/dL   BUN 19 8 - 27 mg/dL   Creatinine, Ser 4.09 (H) 0.57 - 1.00 mg/dL   eGFR 62 >81 XB/JYN/8.29   BUN/Creatinine Ratio 18 12 - 28   Sodium 141 134 - 144 mmol/L   Potassium 4.8 3.5 - 5.2 mmol/L   Chloride 101 96 - 106 mmol/L   CO2 29 20 - 29 mmol/L   Calcium 9.6 8.7 - 10.3 mg/dL   Total Protein 7.3 6.0 - 8.5 g/dL   Albumin 4.6 3.8 - 4.9 g/dL   Globulin, Total 2.7 1.5 - 4.5 g/dL   Bilirubin Total 0.3 0.0 - 1.2 mg/dL   Alkaline Phosphatase 120 44 - 121 IU/L   AST 24 0 - 40 IU/L   ALT 21 0 - 32 IU/L  Lipid panel  Result Value Ref Range   Cholesterol, Total 127 100 - 199 mg/dL   Triglycerides 562 (H) 0 - 149 mg/dL   HDL 34 (L) >13 mg/dL   VLDL Cholesterol Cal 26 5 - 40 mg/dL   LDL Chol Calc (NIH) 67 0 - 99 mg/dL   Chol/HDL Ratio 3.7 0.0 - 4.4 ratio  TSH + free T4  Result Value Ref Range   TSH 2.060 0.450 - 4.500 uIU/mL   Free T4 1.27 0.82 - 1.77 ng/dL       Pertinent labs & imaging  results that were available during my care of the patient were reviewed by me and considered in my medical decision making.  Assessment & Plan:  [place] was seen today for dysuria.  Diagnoses and all orders for this visit:  Dysuria -     Urinalysis, Routine w reflex microscopic -     Urine Culture  Irritant contact dermatitis, unspecified trigger -     triamcinolone cream (KENALOG) 0.1 %; Apply 1 Application topically 2 (two) times daily.  Acute cystitis with hematuria -     sulfamethoxazole-trimethoprim (BACTRIM DS) 800-160 MG tablet; Take 1 tablet by mouth 2 (two) times daily for 7 days.     Assessment and Plan    Urinary Tract Infection Dysuria, frequency, and urgency for 1 week. No fever, chills, nausea, vomiting, confusion, or weakness. Urinalysis positive for infection. -Start empiric antibiotic therapy and send urine culture. -If culture results indicate a need to change antibiotics, will contact patient to adjust treatment.  Contact Dermatitis Localized rash on arm for 2 weeks, possibly due to contact with an allergen. Tried over-the-counter cortisone cream and other topical treatments with no improvement. -Prescribe prescription strength steroid cream. -If no improvement within 2-3 days, patient to notify office.  Hypertension Blood pressure 142/80 in office. Patient does not monitor blood pressure at home. -No changes to current management plan.  General Health Maintenance / Followup Plans -Follow up after completion of antibiotic therapy to ensure resolution of UTI symptoms.          Continue all other maintenance medications.  Follow up plan: Return if symptoms worsen or fail to improve.   Continue healthy lifestyle choices, including diet (rich in fruits, vegetables, and lean proteins, and low in salt and simple carbohydrates) and exercise (at least 30 minutes of moderate physical activity daily).  The above assessment and management plan was discussed  with the patient. The patient verbalized understanding of and has agreed to the management plan. Patient is aware to call the clinic if they develop any new symptoms or if symptoms persist or worsen. Patient is aware when to return to the clinic for a follow-up visit. Patient educated on when it is appropriate to go to the emergency department.   Kari Baars, FNP-C [facility] [phone]

## 2023-05-16 LAB — URINE CULTURE

## 2023-05-17 ENCOUNTER — Other Ambulatory Visit: Payer: Self-pay | Admitting: *Deleted

## 2023-05-17 DIAGNOSIS — N3001 Acute cystitis with hematuria: Secondary | ICD-10-CM

## 2023-06-07 ENCOUNTER — Encounter: Payer: Self-pay | Admitting: Pulmonary Disease

## 2023-06-07 ENCOUNTER — Ambulatory Visit: Payer: Medicare HMO | Admitting: Pulmonary Disease

## 2023-06-07 VITALS — BP 162/92 | HR 73 | Temp 97.8°F | Ht 64.0 in | Wt 346.0 lb

## 2023-06-07 DIAGNOSIS — J4550 Severe persistent asthma, uncomplicated: Secondary | ICD-10-CM

## 2023-06-07 DIAGNOSIS — J8283 Eosinophilic asthma: Secondary | ICD-10-CM

## 2023-06-07 MED ORDER — BREZTRI AEROSPHERE 160-9-4.8 MCG/ACT IN AERO: 2.0000 | INHALATION_SPRAY | Freq: Two times a day (BID) | RESPIRATORY_TRACT | 12 refills | Status: DC

## 2023-06-07 MED ORDER — BENZONATATE 200 MG PO CAPS: 200.0000 mg | ORAL_CAPSULE | Freq: Three times a day (TID) | ORAL | 3 refills | Status: DC | PRN

## 2023-06-07 NOTE — Patient Instructions (Signed)
Nice to meet you  Use Breztri 2 puffs in the morning and 2 puffs in the evening, rinse your mouth out with water after every use.  I provided samples, each sample last 7 days.  I also refilled this.  There is a dose counter on top of the inhaler keep an eye on this.  Continue albuterol as needed.  Paperwork today for [employer].  This is the injectable medicine to treat asthma that can be linked with eosinophils.  This is once every 4 weeks.  First dose will be in the office.  Additional doses can be given at home.  Our office will be in contact once we can get this approved and start the process for first dose.  The ideas it helps her asthma, helps your cough, helps you feel less short of breath etc.  Return to clinic in 3 months or sooner as needed with Dr. Judeth Horn

## 2023-06-08 ENCOUNTER — Telehealth: Payer: Self-pay | Admitting: Pharmacist

## 2023-06-08 ENCOUNTER — Other Ambulatory Visit (HOSPITAL_COMMUNITY): Payer: Self-pay

## 2023-06-08 NOTE — Telephone Encounter (Signed)
Received notification from [facility] regarding a prior authorization for NUCALA. Authorization has been APPROVED from 07/25/2022 to 07/25/2023. Approval letter sent to scan center.  Per test claim, copay for 28 days supply is $376.84  Authorization # [id number]  PAP missing household income and household size. MyChart message sent to patient since her VM box is full.  Application placed in PAP pending info folder  Chesley Mires, PharmD, MPH, BCPS, CPP Clinical Pharmacist (Rheumatology and Pulmonology)

## 2023-06-08 NOTE — Progress Notes (Addendum)
Synopsis: Referred for cough, dyspnea, possible history of asthma by [name], MD  Subjective:   PATIENT ID: Timoteo Gaul GENDER: female DOB: 1961/01/21, MRN: [id number]  Chief Complaint  Patient presents with   New Patient (Initial Visit)    Sob   62 y.o. woman with history of asthma whom are seen for evaluation of shortness of breath and cough.  Multiple prior pulmonary notes reviewed.  Workup for symptoms has been somewhat unrevealing.  PFTs relatively reassuring.  She has been on and off inhalers including Breztri.  Resumed at last visit.  She does feel like it helps.  Cough is improved.  Dyspnea is improved.  But she still has residual cough fairly frequently throughout the day possibly worse at night.  Significant dyspnea on exertion walking longer distance.  Again improved overall but still with relatively uncontrolled symptoms.  We discussed at length the likely multifactorial nature of her symptoms.  Importance for exercise and exertion which she acknowledges.  Discussed given her the tools to treat asthma as aggressively as possible.  With relatively uncontrolled symptoms of dyspnea and cough reasonable to escalate.  She has had elevated eosinophils.  Discussed biologic therapy in detail.  Continue cardio IL-5 and eosinophils.  She expressed understanding.  She is eager to try biologic therapy.  Paperwork today.  HPI initial visit: Seen by PCP 8/5. Maintained on singulair, trelegy and started on prednisone taper, levaquin. She doesn't note much difference with trelegy as opposed to breo.   She says she has DOE that's been on and off, but lately it has been progressive over last 3 months. DOE to 50 ft currently. She has some accompanying chest pain that is worse with coughing. Pleuritic after a bad coughing spell. Had bronchitis about 3 weeks ago given above tx, she feels tessalon perles were probably more helpful than prednisone. She never took the levaquin. She doesn't note  any worsening orthopnea - she always feels smothered when she lies on her back. Some worsening swelling in R>L foot.   Smoked very little when she was in middle school. She works for [employer] in [place] division. 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 She has lived in [place] for a period. She has a cat. No pet bird or hot tub.  No first degree relatives with lung disease.   Interval HPI:  PSG not scheduled, PFTs with mild restriction today. Had been off of breztri for a month prior to PFT. Has felt much worse off of breztri. Increased DOE. Some cough but not especially bothersome, only when she first gets up.   She is using her albuterol inhaler maybe once weekly currently.    Otherwise pertinent review of systems is negative.  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   Anxiety    ARF (acute renal failure) (HCC) 07/16/2019   Arthritis    Asthma    Colon polyps    Complex tear of medial meniscus of right knee as current injury 10/22/2013   Depression    Diabetes mellitus without complication (HCC)    GERD (gastroesophageal reflux disease)    Headache    occasional headaches    High cholesterol    Hypertension    Lithium toxicity 06/2019   has since resolved after discontinuing    PONV (postoperative nausea and vomiting)    Primary  insomnia 08/20/2019   Shortness of breath dyspnea    due to weight gain    Sleep apnea    has not use CPAP machine in 2 yrs/ DOES NOT KNOW IF SHE NEEDS TO USE C - PAP   Syncope and collapse    due to severe knee pain    Total knee replacement status 04/16/2014     Family History  Problem Relation Age of Onset   Alcohol abuse Paternal Grandfather    Tuberculosis Paternal Grandfather    Breast cancer Maternal Grandmother    Cancer Maternal Grandmother        breast   Heart attack Maternal Grandfather     Hyperlipidemia Father    Hypertension Father    Heart disease Father 77       CABG   Gout Father    Arthritis Mother    Heart disease Mother    Cancer Brother        colon, lung, liver   Hypertension Sister    Cancer Daughter        NEUROBLASTOMA     Past Surgical History:  Procedure Laterality Date   BACK SURGERY  2012   lumb fusion   CARPAL TUNNEL RELEASE  2007   both hands   COLONOSCOPY     ENDOMETRIAL ABLATION W/ NOVASURE     EXCISION METACARPAL MASS Right 03/10/2014   Procedure: EXCISION MASS RIGHT HAND FIRST WEB SPACE DORSAL PALMAR INCISION ;  Surgeon: Cindee Salt, MD;  Location: [facility];  Service: Orthopedics;  Laterality: Right;   EXCISIONAL TOTAL KNEE ARTHROPLASTY WITH ANTIBIOTIC SPACERS Left 09/19/2019   Procedure: EXCISIONAL  LEFT TOTAL KNEE ARTHROPLASTY WITH TOTAL KNEE REVISION;  Surgeon: Durene Romans, MD;  Location: [facility];  Service: Orthopedics;  Laterality: Left;  2 hrs   IRRIGATION AND DEBRIDEMENT KNEE Left 05/23/2014   Procedure: IRRIGATION AND DEBRIDEMENT KNEE;  Surgeon: [name], MD;  Location: [facility];  Service: Orthopedics;  Laterality: Left;  With POLYETHYLENE EXCHANGE   KNEE ARTHROSCOPY     let knee 03/2013    KNEE ARTHROSCOPY WITH MEDIAL MENISECTOMY Right 10/22/2013   Procedure: RIGHT KNEE ARTHROSCOPY WITH MEDIAL MENISECTOMY, abrasion chrondrplasty Brigid Re OUT/CORRECT CURETTEMENT/BONE GRAFT/PROXIMAL TIBIA ;  Surgeon: Jacki Cones, MD;  Location: [facility];  Service: Orthopedics;  Laterality: Right;   TOTAL KNEE ARTHROPLASTY Left 04/16/2014   Procedure: LEFT TOTAL KNEE ARTHROPLASTY;  Surgeon: Jacki Cones, MD;  Location: [facility];  Service: Orthopedics;  Laterality: Left;   TOTAL KNEE ARTHROPLASTY Right 12/23/2014   Procedure: TOTAL KNEE ARTHROPLASTY;  Surgeon: Ranee Gosselin, MD;  Location: [facility];  Service: Orthopedics;  Laterality: Right;   trigger finger release surgeyr      bilateral    TUBAL LIGATION      Social History    Socioeconomic History   Marital status: Married    Spouse name: Trey Paula   Number of children: 2   Years of education: 12   Highest education level: Some college, no degree  Occupational History    Comment: Disabled  Tobacco Use   Smoking status: Never   Smokeless tobacco: Never  Vaping Use   Vaping status: Never Used  Substance and Sexual Activity   Alcohol use: No   Drug use: No   Sexual activity: Yes    Birth control/protection: Surgical    Comment: tubal & ablation  Other Topics Concern   Not on file  Social History Narrative   Lives with her  husband who helps with grocery shopping and other transportation needs.     Has two children, Billey Gosling and Clydie Braun and grandchildren - they all live nearby.   There are two outside dogs and several  Horses.    Social Determinants of Health   Financial Resource Strain: Low Risk  (05/12/2023)   Overall Financial Resource Strain (CARDIA)    Difficulty of Paying Living Expenses: Not hard at all  Food Insecurity: No Food Insecurity (05/12/2023)   Hunger Vital Sign    Worried About Running Out of Food in the Last Year: Never true    Ran Out of Food in the Last Year: Never true  Transportation Needs: No Transportation Needs (05/12/2023)   PRAPARE - Administrator, Civil Service (Medical): No    Lack of Transportation (Non-Medical): No  Physical Activity: Inactive (05/12/2023)   Exercise Vital Sign    Days of Exercise per Week: 0 days    Minutes of Exercise per Session: 0 min  Stress: No Stress Concern Present (05/12/2023)   [employer] of Occupational Health - Occupational Stress Questionnaire    Feeling of Stress : Only a little  Social Connections: Socially Integrated (05/12/2023)   Social Connection and Isolation Panel [NHANES]    Frequency of Communication with Friends and Family: More than three times a week    Frequency of Social Gatherings with Friends and Family: More than three times a week    Attends  Religious Services: More than 4 times per year    Active Member of [employer] or Organizations: Yes    Attends Engineer, structural: More than 4 times per year    Marital Status: Married  Catering manager Violence: Not At Risk (03/08/2023)   Humiliation, Afraid, Rape, and Kick questionnaire    Fear of Current or Ex-Partner: No    Emotionally Abused: No    Physically Abused: No    Sexually Abused: No     Allergies  Allergen Reactions   Chocolate Flavoring Agent (Non-Screening)     Other reaction(s): headache   Other Itching and Rash    Other reaction(s): other   Adhesive [Tape] Other (See Comments)    Peeled off skin   Ceclor [Cefaclor] Other (See Comments)    Blisters on hands   Chocolate Other (See Comments)    migraines   Venlafaxine Other (See Comments)    "refuses to take - makes me feel like I am dying"   Lisinopril Cough     Outpatient Medications Prior to Visit  Medication Sig Dispense Refill   albuterol (VENTOLIN HFA) 108 (90 Base) MCG/ACT inhaler Inhale 1-2 puffs into the lungs every 6 (six) hours as needed for wheezing or shortness of breath. 1 each 11   Blood Glucose Monitoring Suppl DEVI 1 each by Does not apply route in the morning, at noon, and at bedtime. May substitute to any manufacturer covered by patient's insurance. 1 each 0   fluocinonide cream (LIDEX) 0.05 % Apply 1 Application topically 2 (two) times daily. 30 g 5   fluticasone (FLONASE) 50 MCG/ACT nasal spray Place 2 sprays into both nostrils daily. 16 g 6   furosemide (LASIX) 40 MG tablet TAKE 1 TABLET BY MOUTH EVERY DAY 90 tablet 0   gabapentin (NEURONTIN) 300 MG capsule Take 1 capsule by mouth 2 (two) times daily.     Glucose Blood (BLOOD GLUCOSE TEST STRIPS) STRP Check BS in the morning, at noon and at bedtime Dx R73.03 300 strip 3  Lancets (ONETOUCH DELICA PLUS LANCET33G) MISC Check BS in morning, at noon and at bedtime Dx R73.03 300 each 3   levothyroxine (SYNTHROID) 100 MCG tablet TAKE 1  TABLET BY MOUTH EVERY DAY BEFORE BREAKFAST 90 tablet 3   losartan (COZAAR) 50 MG tablet Take 1 tablet (50 mg total) by mouth daily. 90 tablet 3   metFORMIN (GLUCOPHAGE-XR) 500 MG 24 hr tablet TAKE 1 TABLET BY MOUTH EVERY DAY WITH BREAKFAST 90 tablet 0   methocarbamol (ROBAXIN) 500 MG tablet Take 500 mg by mouth every 6 (six) hours as needed for muscle spasms.     montelukast (SINGULAIR) 10 MG tablet TAKE 1 TABLET BY MOUTH EVERYDAY AT BEDTIME 90 tablet 1   nabumetone (RELAFEN) 500 MG tablet Take 2 tablets (1,000 mg total) by mouth 2 (two) times daily. For muscle and joint pain 360 tablet 0   omeprazole (PRILOSEC) 40 MG capsule TAKE 1 CAPSULE BY MOUTH EVERY DAY 90 capsule 3   promethazine (PHENERGAN) 25 MG tablet TAKE 1 TABLET BY MOUTH EVERY 6 HOURS AS NEEDED FOR NAUSEA AND VOMITING 90 tablet 5   rosuvastatin (CRESTOR) 40 MG tablet TAKE 1 TABLET BY MOUTH EVERY DAY FOR CHOLESTEROL 90 tablet 0   sertraline (ZOLOFT) 50 MG tablet TAKE 1 TABLET BY MOUTH EVERYDAY AT BEDTIME 90 tablet 0   traZODone (DESYREL) 100 MG tablet TAKE 3 TABLETS (300MG ) BY MOUTH EVERY DAY AT BEDTIME 270 tablet 3   triamcinolone cream (KENALOG) 0.1 % Apply 1 Application topically 2 (two) times daily. 30 g 0   benzonatate (TESSALON) 200 MG capsule Take 1 capsule (200 mg total) by mouth 3 (three) times daily as needed for cough. 20 capsule 0   Budeson-Glycopyrrol-Formoterol (BREZTRI AEROSPHERE) 160-9-4.8 MCG/ACT AERO Inhale 2 puffs into the lungs in the morning and at bedtime. 2 each 0   VITAMIN D PO Take by mouth. (Patient not taking: Reported on 06/07/2023)     No facility-administered medications prior to visit.       Objective:   Physical Exam:  General appearance: 62 y.o., female, NAD, conversant  Eyes: anicteric sclerae; PERRL, tracking appropriately HENT: NCAT; MMM Neck: Trachea midline; no lymphadenopathy, no JVD Lungs: CTAB, no crackles, no wheeze, with normal respiratory effort CV: RRR, no murmur  Abdomen: Soft,  non-tender; non-distended, BS present  Extremities: No peripheral edema, warm Skin: Normal turgor and texture; no rash Psych: Appropriate affect Neuro: Alert and oriented to person and place, no focal deficit     Vitals:   06/07/23 1114  BP: (!) 162/92  Pulse: 73  Temp: 97.8 F (36.6 C)  TempSrc: Oral  SpO2: 90%  Weight: (!) 346 lb (156.9 kg)  Height: 5\' 4"  (1.626 m)      90% on RA BMI Readings from Last 3 Encounters:  06/07/23 59.39 kg/m  05/12/23 58.29 kg/m  04/24/23 58.60 kg/m   Wt Readings from Last 3 Encounters:  06/07/23 (!) 346 lb (156.9 kg)  05/12/23 (!) 339 lb 9.6 oz (154 kg)  04/24/23 (!) 341 lb 6.4 oz (154.9 kg)     CBC    Component Value Date/Time   WBC 6.9 04/24/2023 1413   WBC 7.5 11/15/2022 1613   RBC 4.18 04/24/2023 1413   RBC 4.32 11/15/2022 1613   HGB 13.4 04/24/2023 1413   HCT 42.1 04/24/2023 1413   PLT 177 04/24/2023 1413   MCV 101 (H) 04/24/2023 1413   MCH 32.1 04/24/2023 1413   MCH 30.4 09/20/2019 0231   MCHC 31.8 04/24/2023 1413  MCHC 33.5 11/15/2022 1613   RDW 13.9 04/24/2023 1413   LYMPHSABS 1.8 04/24/2023 1413   MONOABS 0.5 11/15/2022 1613   EOSABS 0.3 04/24/2023 1413   BASOSABS 0.0 04/24/2023 1413    Low level eosinophilia historically  Chest Imaging: CTA Chest 01/2019 reviewed by me and remarkable for 8mm nodule near minor fissure, no LAD, some likely dependent atelectasis.   CXR 12/2019 reviewed by me and unremarkable  06/2019 NM perfusion: no perfusion defect  Pulmonary Functions Testing Results:    Latest Ref Rng & Units 01/16/2023   10:24 AM 04/01/2021   11:33 AM  PFT Results  FVC-Pre L 2.34  2.54   FVC-Predicted Pre % 70  72   FVC-Post L 2.27  2.50   FVC-Predicted Post % 68  71   Pre FEV1/FVC % % 87  86   Post FEV1/FCV % % 87  90   FEV1-Pre L 2.03  2.18   FEV1-Predicted Pre % 78  79   FEV1-Post L 1.98  2.25   DLCO uncorrected ml/min/mmHg 21.38  19.46   DLCO UNC% % 105  91   DLCO corrected ml/min/mmHg  21.79  20.40   DLCO COR %Predicted % 107  96   DLVA Predicted % 142  127   TLC L 3.87  4.53   TLC % Predicted % 76  87   RV % Predicted % 71  72     PFTs reviewed by me showing nonspecific ventilatory defect, equivocal results for air trapping and elevated DLCO for alveolar volume  Echocardiogram:   TTE 2021: mildly dilated LAD, LV hypertrophy, normal EF.  TTE 2014: G1DD  [facility] stress 2014 report essentially normal       Assessment & Plan:   # Dyspnea on exertion: Overall improved with triple inhaled therapy via Breztri.  Still with exertional dyspnea.  Suspect related to poorly controlled asthma  # Chronic cough: Suspect multifactorial related to postnasal drip possible GERD.  Large driver includes poorly controlled asthma.  Escalate therapies as below.  Encouraged adherence to inhalers as well as Flonase as needed.  #Severe persistent asthma with eosinophilia: Improved overall still with significant symptoms of cough and dyspnea on exertion.  Eosinophils mildly elevated.  Plan: - Nucala paperwork uncontrolled asthma with eosinophilia - Continue Breztri, refilled today - albuterol 2 puffs as needed - flonase  RTC 3 months  I spent 40 minutes in the care of the patient including face-to-face visit, review of records, coordination of care.  Karren Burly, MD [facility] 06/08/2023 9:54 AM

## 2023-06-08 NOTE — Telephone Encounter (Signed)
Patient returned call and provided missing information  Submitted Patient Assistance Application to [employer] for [facility] along with provider portion, patient portion, PA, medication list, insurance card copy. Will update patient when we receive a response.  Phone #: [phone] Fax #: [phone]  Chesley Mires, PharmD, MPH, BCPS, CPP Clinical Pharmacist (Rheumatology and Pulmonology)

## 2023-06-08 NOTE — Telephone Encounter (Signed)
Received new start paperwork for Nucala. Placed in PAP pending info folder  Submitted a Prior Authorization request to [facility] for NUCALA via CoverMyMeds. Will update once we receive a response.  Key: [id number]   Chesley Mires, PharmD, MPH, BCPS, CPP Clinical Pharmacist (Rheumatology and Pulmonology)

## 2023-06-16 NOTE — Telephone Encounter (Signed)
Patient scheduled for Nucala new start on 06/19/23

## 2023-06-16 NOTE — Progress Notes (Signed)
HPI Patient presents today to [facility] to see pharmacy team for [facility] new start.  Past medical history includes ***  Number of hospitalizations in past year: Number of ***COPD/asthma exacerbations in past year:   Respiratory Medications Current regimen: Breztri 160-9-4.5mcg (2 puffs twice daily), montelukast 10mg  nightly Tried in past: *** Patient reports {Adherence challenges yes no:[id number]::"adherence challenges","no known adherence challenges"}  OBJECTIVE Allergies  Allergen Reactions   Chocolate Flavoring Agent (Non-Screening)     Other reaction(s): headache   Other Itching and Rash    Other reaction(s): other   Adhesive [Tape] Other (See Comments)    Peeled off skin   Ceclor [Cefaclor] Other (See Comments)    Blisters on hands   Chocolate Other (See Comments)    migraines   Venlafaxine Other (See Comments)    "refuses to take - makes me feel like I am dying"   Lisinopril Cough    Outpatient Encounter Medications as of 06/19/2023  Medication Sig   albuterol (VENTOLIN HFA) 108 (90 Base) MCG/ACT inhaler Inhale 1-2 puffs into the lungs every 6 (six) hours as needed for wheezing or shortness of breath.   benzonatate (TESSALON) 200 MG capsule Take 1 capsule (200 mg total) by mouth 3 (three) times daily as needed for cough.   Blood Glucose Monitoring Suppl DEVI 1 each by Does not apply route in the morning, at noon, and at bedtime. May substitute to any manufacturer covered by patient's insurance.   Budeson-Glycopyrrol-Formoterol (BREZTRI AEROSPHERE) 160-9-4.8 MCG/ACT AERO Inhale 2 puffs into the lungs in the morning and at bedtime.   fluocinonide cream (LIDEX) 0.05 % Apply 1 Application topically 2 (two) times daily.   fluticasone (FLONASE) 50 MCG/ACT nasal spray Place 2 sprays into both nostrils daily.   furosemide (LASIX) 40 MG tablet TAKE 1 TABLET BY MOUTH EVERY DAY   gabapentin (NEURONTIN) 300 MG capsule Take 1 capsule by mouth 2 (two) times daily.   Glucose  Blood (BLOOD GLUCOSE TEST STRIPS) STRP Check BS in the morning, at noon and at bedtime Dx R73.03   Lancets (ONETOUCH DELICA PLUS LANCET33G) MISC Check BS in morning, at noon and at bedtime Dx R73.03   levothyroxine (SYNTHROID) 100 MCG tablet TAKE 1 TABLET BY MOUTH EVERY DAY BEFORE BREAKFAST   losartan (COZAAR) 50 MG tablet Take 1 tablet (50 mg total) by mouth daily.   metFORMIN (GLUCOPHAGE-XR) 500 MG 24 hr tablet TAKE 1 TABLET BY MOUTH EVERY DAY WITH BREAKFAST   methocarbamol (ROBAXIN) 500 MG tablet Take 500 mg by mouth every 6 (six) hours as needed for muscle spasms.   montelukast (SINGULAIR) 10 MG tablet TAKE 1 TABLET BY MOUTH EVERYDAY AT BEDTIME   nabumetone (RELAFEN) 500 MG tablet Take 2 tablets (1,000 mg total) by mouth 2 (two) times daily. For muscle and joint pain   omeprazole (PRILOSEC) 40 MG capsule TAKE 1 CAPSULE BY MOUTH EVERY DAY   promethazine (PHENERGAN) 25 MG tablet TAKE 1 TABLET BY MOUTH EVERY 6 HOURS AS NEEDED FOR NAUSEA AND VOMITING   rosuvastatin (CRESTOR) 40 MG tablet TAKE 1 TABLET BY MOUTH EVERY DAY FOR CHOLESTEROL   sertraline (ZOLOFT) 50 MG tablet TAKE 1 TABLET BY MOUTH EVERYDAY AT BEDTIME   traZODone (DESYREL) 100 MG tablet TAKE 3 TABLETS (300MG ) BY MOUTH EVERY DAY AT BEDTIME   triamcinolone cream (KENALOG) 0.1 % Apply 1 Application topically 2 (two) times daily.   VITAMIN D PO Take by mouth. (Patient not taking: Reported on 06/07/2023)   No facility-administered encounter medications on  file as of 06/19/2023.     Immunization History  Administered Date(s) Administered   DTaP 07/25/2013   Influenza Inj Mdck Quad Pf 04/29/2021, 03/25/2022   Influenza Split 05/08/2013   Influenza, High Dose Seasonal PF 07/07/2014, 05/14/2018, 04/02/2019   Influenza, Mdck, Trivalent,PF 6+ MOS(egg free) 03/30/2023   Influenza, Quadrivalent, Recombinant, Inj, Pf 05/14/2018   Influenza, Seasonal, Injecte, Preservative Fre 07/07/2014   Influenza,inj,Quad PF,6+ Mos 07/07/2014, 07/07/2014,  05/14/2018, 04/02/2019, 03/31/2020   Influenza,trivalent, recombinat, inj, PF 05/08/2013   Influenza-Unspecified 02/23/2016, 05/25/2021   Moderna Covid-19 Fall Seasonal Vaccine 65yrs & older 03/30/2023   Moderna Covid-19 Vaccine Bivalent Booster 56yrs & up 06/01/2021   Moderna SARS-COV2 Booster Vaccination 12/15/2020   Moderna Sars-Covid-2 Vaccination 11/14/2019, 12/12/2019, 06/13/2020   PNEUMOCOCCAL CONJUGATE-20 05/24/2021   Pneumococcal Conjugate-13 12/27/2010   Pneumococcal Polysaccharide-23 05/04/2011   Pneumococcal-Unspecified 05/04/2011, 05/25/2021   Tdap 02/25/2013   Unspecified SARS-COV-2 Vaccination 04/26/2022   Zoster Recombinant(Shingrix) 08/25/2021, 01/27/2022     PFTs    Latest Ref Rng & Units 01/16/2023   10:24 AM 04/01/2021   11:33 AM  PFT Results  FVC-Pre L 2.34  2.54   FVC-Predicted Pre % 70  72   FVC-Post L 2.27  2.50   FVC-Predicted Post % 68  71   Pre FEV1/FVC % % 87  86   Post FEV1/FCV % % 87  90   FEV1-Pre L 2.03  2.18   FEV1-Predicted Pre % 78  79   FEV1-Post L 1.98  2.25   DLCO uncorrected ml/min/mmHg 21.38  19.46   DLCO UNC% % 105  91   DLCO corrected ml/min/mmHg 21.79  20.40   DLCO COR %Predicted % 107  96   DLVA Predicted % 142  127   TLC L 3.87  4.53   TLC % Predicted % 76  87   RV % Predicted % 71  72      Eosinophils Most recent blood eosinophil count was 300 cells/microL taken on 04/24/2023.   IgE: *** on ***   Assessment   Biologics training for mepolizumab (Nucala)  Goals of therapy: Mechanism of Action: Not fully understood. It does act an interleukin-5 (IL-5) antagonist monoclonal antibody that reduces the production and survival of eosinophils by blocking the binding of IL-5 to the alpha chain of the receptor complex on the eosinophil cell surface. Reviewed that Nucala is add-on medication and patient must continue maintenance inhaler regimen. Response to therapy: may take 3 months to 6 months to determine efficacy. Discussed that  patients generally feel improvement sooner than 3 months.  Side effects: headache (19%), injection site reaction (7-15%), antibody development (6%), backache (5%), fatigue (5%)  Dose: 100 mg subcutaneously every 4 weeks  Administration/Storage:  Reviewed administration sites of thigh or abdomen (at least 2-3 inches away from abdomen). Reviewed the upper arm is only appropriate if caregiver is administering injection  Do not shake the reconstituted solution as this could lead to product foaming or precipitation. Solution should be clear to opalescent and colorless to pale yellow or pale brown, essentially particle free. Small air bubbles, however, are expected and acceptable. If particulate matter remains in the solution or if the solution appears cloudy or milky, discard the solution.  Reviewed storage of medication in refrigerator. Reviewed that Nucala can be stored at room temperature in unopened carton for up to 7 days.  Access: Approval of Nucala through: patient assistance Renewal application completed today  Patient self-administered Nucala 100mg /mL in {injsitedsg:28167} using sample Nucala 100mg /mL autoinjector  pen NDC: *** Lot: *** Expiration: ***  Patient monitored for 30 minutes for adverse reaction.  Patient tolerated ***.  Injection site noted. {injectionreaction:30756}  Medication Reconciliation  A drug regimen assessment was performed, including review of allergies, interactions, disease-state management, dosing and immunization history. Medications were reviewed with the patient, including name, instructions, indication, goals of therapy, potential side effects, importance of adherence, and safe use.  Drug interaction(s): ***  Immunizations  Patient is indicated for the influenzae, pneumonia, and shingles vaccinations. Patient has received *** COVID19 vaccines.   PLAN Continue Nucala 100mg  every 4 weeks. Next dose is due 07/17/23 and every 4 weeks thereafter. Rx  sent to: [facility] [employer]: [phone]. Patient provided with pharmacy phone number and advised to call later this week to schedule shipment to home. Continue maintenance asthma regimen of: Breztri 160-9-4.52mcg (2 puffs twice daily), montelukast 10mg  nightly  All questions encouraged and answered.  Instructed patient to reach out with any further questions or concerns.  Thank you for allowing pharmacy to participate in this patient's care.  This appointment required 45 minutes of patient care (this includes precharting, chart review, review of results, face-to-face care, etc.).

## 2023-06-16 NOTE — Patient Instructions (Incomplete)
Your next NUCALA dose is due on 07/17/23, 08/14/2023, and every 4 WEEKS thereafter  CONTINUE Breztri two puffs twice daily and montelukast 10mg  nightly  Your prescription will be shipped from [facility] Their phone number is [phone] Please call to schedule shipment and confirm address. They will mail your medication to your home.  You will need to be seen by your provider in 3 to 4 months to assess how Nucala is working for you. Please ensure you keep you follow-up appointment that is scheduled in February 2024. Call our clinic if you need to reschedule this appointment.  Stay up to date on all routine vaccines: influenza, pneumonia, COVID19, Shingles  How to manage an injection site reaction: Remember the 5 C's: COUNTER - leave on the counter at least 30 minutes but up to overnight to bring medication to room temperature. This may help prevent stinging COLD - place something cold (like an ice gel pack or cold water bottle) on the injection site just before cleansing with alcohol. This may help reduce pain CLARITIN - use Claritin (generic name is loratadine) for the first two weeks of treatment or the day of, the day before, and the day after injecting. This will help to minimize injection site reactions CORTISONE CREAM - apply if injection site is irritated and itching CALL ME - if injection site reaction is bigger than the size of your fist, looks infected, blisters, or if you develop hives

## 2023-06-16 NOTE — Telephone Encounter (Signed)
Received a fax from  [facility] to [place] regarding an approval for NUCALA patient assistance from 06/15/23 to 07/25/23. Approval letter sent to scan center.  Phone #: [phone] Fax #: [phone]  Will need to REAPPLY for 2025  Patient can be scheduled for Nucala new start appt  Chesley Mires, PharmD, MPH, BCPS, CPP Clinical Pharmacist (Rheumatology and Pulmonology)

## 2023-06-19 ENCOUNTER — Telehealth: Payer: Self-pay | Admitting: Pharmacist

## 2023-06-19 ENCOUNTER — Ambulatory Visit: Payer: Medicare HMO | Admitting: Pharmacist

## 2023-06-19 DIAGNOSIS — J8283 Eosinophilic asthma: Secondary | ICD-10-CM

## 2023-06-19 DIAGNOSIS — Z7189 Other specified counseling: Secondary | ICD-10-CM

## 2023-06-19 MED ORDER — NUCALA 100 MG/ML ~~LOC~~ SOAJ: 100.0000 mg | SUBCUTANEOUS | 5 refills | Status: DC

## 2023-06-19 NOTE — Telephone Encounter (Signed)
Patient completed Nucala PAP renewal application at new start visit today since she's unfortunately only approved through 07/25/2023.  Provider portion placed in Dr. Laurena Spies mailbox for signature  Chesley Mires, PharmD, MPH, BCPS, CPP Clinical Pharmacist (Rheumatology and Pulmonology)

## 2023-06-20 ENCOUNTER — Other Ambulatory Visit (HOSPITAL_COMMUNITY): Payer: Self-pay

## 2023-06-23 ENCOUNTER — Other Ambulatory Visit: Payer: Self-pay | Admitting: Family Medicine

## 2023-06-23 DIAGNOSIS — M7989 Other specified soft tissue disorders: Secondary | ICD-10-CM

## 2023-06-29 NOTE — Telephone Encounter (Signed)
Received signed provider form.   Submitted a Prior Authorization request to [facility] for NUCALA via CoverMyMeds. Will update once we receive a response.  Key: [id number]   Chesley Mires, PharmD, MPH, BCPS, CPP Clinical Pharmacist (Rheumatology and Pulmonology)

## 2023-06-29 NOTE — Telephone Encounter (Signed)
Received notification from [facility] regarding a prior authorization for NUCALA. Authorization has been APPROVED from 07/26/23 to 07/24/24. Approval letter sent to scan center.  Submitted Patient Assistance RENEWAL Application to [facility] for [place] along with provider portion, patient portion, PA, medication list, insurance card copy. Will update patient when we receive a response.  Phone #: [phone] Fax #: [phone]  Chesley Mires, PharmD, MPH, BCPS, CPP Clinical Pharmacist (Rheumatology and Pulmonology)

## 2023-07-13 ENCOUNTER — Other Ambulatory Visit: Payer: Self-pay | Admitting: Family Medicine

## 2023-07-13 DIAGNOSIS — F5101 Primary insomnia: Secondary | ICD-10-CM

## 2023-07-14 ENCOUNTER — Other Ambulatory Visit: Payer: Self-pay | Admitting: Family Medicine

## 2023-07-16 ENCOUNTER — Other Ambulatory Visit: Payer: Self-pay | Admitting: Family Medicine

## 2023-07-16 DIAGNOSIS — F331 Major depressive disorder, recurrent, moderate: Secondary | ICD-10-CM

## 2023-07-20 ENCOUNTER — Ambulatory Visit (INDEPENDENT_AMBULATORY_CARE_PROVIDER_SITE_OTHER): Payer: Medicare HMO

## 2023-07-20 VITALS — Ht 64.0 in | Wt 346.0 lb

## 2023-07-20 DIAGNOSIS — Z0000 Encounter for general adult medical examination without abnormal findings: Secondary | ICD-10-CM

## 2023-07-20 NOTE — Patient Instructions (Signed)
[name] , Thank you for taking time to come for your Medicare Wellness Visit. I appreciate your ongoing commitment to your health goals. Please review the following plan we discussed and let me know if I can assist you in the future.   Referrals/Orders/Follow-Ups/Clinician Recommendations: Aim for 30 minutes of exercise or brisk walking, 6-8 glasses of water, and 5 servings of fruits and vegetables each day.  This is a list of the screening recommended for you and due dates:  Health Maintenance  Topic Date Due   Complete foot exam   Never done   Eye exam for diabetics  Never done   Yearly kidney health urinalysis for diabetes  Never done   Colon Cancer Screening  09/25/2021   DTaP/Tdap/Td vaccine (3 - Td or Tdap) 07/26/2023   Hemoglobin A1C  10/22/2023   Mammogram  01/30/2024   Stool Blood Test  03/07/2024   Yearly kidney function blood test for diabetes  04/23/2024   Medicare Annual Wellness Visit  07/19/2024   Pap with HPV screening  03/07/2028   Flu Shot  Completed   COVID-19 Vaccine  Completed   Hepatitis C Screening  Completed   HIV Screening  Completed   Zoster (Shingles) Vaccine  Completed   HPV Vaccine  Aged Out    Advanced directives: (ACP Link)Information on Advanced Care Planning can be found at [facility] [place] Advance Health Care Directives Advance Health Care Directives ([email or web address]   Next Medicare Annual Wellness Visit scheduled for next year: Yes

## 2023-07-20 NOTE — Progress Notes (Signed)
Subjective:   [name] is a 62 y.o. female who presents for Medicare Annual (Subsequent) preventive examination.  Visit Complete: Virtual I connected with  Timoteo Gaul on 07/20/23 by a audio enabled telemedicine application and verified that I am speaking with the correct person using two identifiers.  Patient Location: Home  Provider Location: Home Office  I discussed the limitations of evaluation and management by telemedicine. The patient expressed understanding and agreed to proceed.  Vital Signs: Because this visit was a virtual/telehealth visit, some criteria may be missing or patient reported. Any vitals not documented were not able to be obtained and vitals that have been documented are patient reported.  Patient Medicare AWV questionnaire was completed by the patient on 07/17/23; I have confirmed that all information answered by patient is correct and no changes since this date.  Cardiac Risk Factors include: advanced age (>55men, >57 women);diabetes mellitus;dyslipidemia;hypertension     Objective:    Today's Vitals   07/20/23 1238  Weight: (!) 346 lb (156.9 kg)  Height: 5\' 4"  (1.626 m)   Body mass index is 59.39 kg/m.     07/20/2023   12:52 PM 07/14/2022   10:40 AM 07/13/2021   10:42 AM 07/10/2020    9:37 AM 10/21/2019    7:37 PM 09/19/2019    5:00 PM 09/06/2019   10:23 AM  Advanced Directives  Does Patient Have a Medical Advance Directive? No No No No No No No  Would patient like information on creating a medical advance directive? Yes (MAU/Ambulatory/Procedural Areas - Information given) No - Patient declined No - Patient declined No - Patient declined  No - Patient declined No - Patient declined    Current Medications (verified) Outpatient Encounter Medications as of 07/20/2023  Medication Sig   albuterol (VENTOLIN HFA) 108 (90 Base) MCG/ACT inhaler Inhale 1-2 puffs into the lungs every 6 (six) hours as needed for wheezing or shortness of breath.    benzonatate (TESSALON) 200 MG capsule Take 1 capsule (200 mg total) by mouth 3 (three) times daily as needed for cough.   Blood Glucose Monitoring Suppl DEVI 1 each by Does not apply route in the morning, at noon, and at bedtime. May substitute to any manufacturer covered by patient's insurance.   Budeson-Glycopyrrol-Formoterol (BREZTRI AEROSPHERE) 160-9-4.8 MCG/ACT AERO Inhale 2 puffs into the lungs in the morning and at bedtime.   fluocinonide cream (LIDEX) 0.05 % Apply 1 Application topically 2 (two) times daily.   fluticasone (FLONASE) 50 MCG/ACT nasal spray Place 2 sprays into both nostrils daily.   furosemide (LASIX) 40 MG tablet TAKE 1 TABLET BY MOUTH EVERY DAY   gabapentin (NEURONTIN) 300 MG capsule Take 1 capsule by mouth 2 (two) times daily.   Glucose Blood (BLOOD GLUCOSE TEST STRIPS) STRP Check BS in the morning, at noon and at bedtime Dx R73.03   Lancets (ONETOUCH DELICA PLUS LANCET33G) MISC Check BS in morning, at noon and at bedtime Dx R73.03   levothyroxine (SYNTHROID) 100 MCG tablet TAKE 1 TABLET BY MOUTH EVERY DAY BEFORE BREAKFAST   losartan (COZAAR) 50 MG tablet Take 1 tablet (50 mg total) by mouth daily.   Mepolizumab (NUCALA) 100 MG/ML SOAJ Inject 1 mL (100 mg total) into the skin every 28 (twenty-eight) days. Faxed w patient assistance application   metFORMIN (GLUCOPHAGE-XR) 500 MG 24 hr tablet TAKE 1 TABLET BY MOUTH EVERY DAY WITH BREAKFAST   methocarbamol (ROBAXIN) 500 MG tablet Take 500 mg by mouth every 6 (six) hours as  needed for muscle spasms.   montelukast (SINGULAIR) 10 MG tablet TAKE 1 TABLET BY MOUTH EVERYDAY AT BEDTIME   nabumetone (RELAFEN) 500 MG tablet Take 2 tablets (1,000 mg total) by mouth 2 (two) times daily. For muscle and joint pain   omeprazole (PRILOSEC) 40 MG capsule TAKE 1 CAPSULE BY MOUTH EVERY DAY   promethazine (PHENERGAN) 25 MG tablet TAKE 1 TABLET BY MOUTH EVERY 6 HOURS AS NEEDED FOR NAUSEA AND VOMITING   rosuvastatin (CRESTOR) 40 MG tablet TAKE  1 TABLET BY MOUTH EVERY DAY FOR CHOLESTEROL   sertraline (ZOLOFT) 50 MG tablet TAKE 1 TABLET BY MOUTH EVERYDAY AT BEDTIME   traZODone (DESYREL) 100 MG tablet TAKE 3 TABLETS (300MG ) BY MOUTH EVERY DAY AT BEDTIME   triamcinolone cream (KENALOG) 0.1 % Apply 1 Application topically 2 (two) times daily.   VITAMIN D PO Take by mouth.   No facility-administered encounter medications on file as of 07/20/2023.    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   Anxiety    ARF (acute renal failure) (HCC) 07/16/2019   Arthritis    Asthma    Colon polyps    Complex tear of medial meniscus of right knee as current injury 10/22/2013   Depression    Diabetes mellitus without complication (HCC)    GERD (gastroesophageal reflux disease)    Headache    occasional headaches    High cholesterol    Hypertension    Lithium toxicity 06/2019   has since resolved after discontinuing    PONV (postoperative nausea and vomiting)    Primary insomnia 08/20/2019   Shortness of breath dyspnea    due to weight gain    Sleep apnea    has not use CPAP machine in 2 yrs/ DOES NOT KNOW IF SHE NEEDS TO USE C - PAP   Syncope and collapse    due to severe knee pain    Total knee replacement status 04/16/2014   Past Surgical History:  Procedure Laterality Date   BACK SURGERY  07/25/2010   lumb fusion   CARPAL TUNNEL RELEASE  07/25/2005   both hands   COLONOSCOPY     ENDOMETRIAL ABLATION W/ NOVASURE     EXCISION METACARPAL MASS Right 03/10/2014   Procedure: EXCISION MASS RIGHT HAND FIRST WEB SPACE DORSAL PALMAR INCISION ;  Surgeon: Cindee Salt, MD;  Location: [facility];  Service: Orthopedics;  Laterality: Right;   EXCISIONAL TOTAL KNEE ARTHROPLASTY WITH ANTIBIOTIC  SPACERS Left 09/19/2019   Procedure: EXCISIONAL  LEFT TOTAL KNEE ARTHROPLASTY WITH TOTAL KNEE REVISION;  Surgeon: Durene Romans, MD;  Location: [facility];  Service: Orthopedics;  Laterality: Left;  2 hrs   IRRIGATION AND DEBRIDEMENT KNEE Left 05/23/2014   Procedure: IRRIGATION AND DEBRIDEMENT KNEE;  Surgeon: [name], MD;  Location: [facility];  Service: Orthopedics;  Laterality: Left;  With POLYETHYLENE EXCHANGE   JOINT REPLACEMENT     KNEE ARTHROSCOPY     let knee 03/2013    KNEE ARTHROSCOPY WITH MEDIAL MENISECTOMY Right 10/22/2013   Procedure: RIGHT KNEE ARTHROSCOPY WITH MEDIAL MENISECTOMY, abrasion chrondrplasty Brigid Re OUT/CORRECT CURETTEMENT/BONE GRAFT/PROXIMAL TIBIA ;  Surgeon: Jacki Cones, MD;  Location: [facility];  Service: Orthopedics;  Laterality: Right;   SPINE SURGERY     TOTAL KNEE ARTHROPLASTY  Left 04/16/2014   Procedure: LEFT TOTAL KNEE ARTHROPLASTY;  Surgeon: Jacki Cones, MD;  Location: [facility];  Service: Orthopedics;  Laterality: Left;   TOTAL KNEE ARTHROPLASTY Right 12/23/2014   Procedure: TOTAL KNEE ARTHROPLASTY;  Surgeon: Ranee Gosselin, MD;  Location: [facility];  Service: Orthopedics;  Laterality: Right;   trigger finger release surgeyr      bilateral    TUBAL LIGATION     Family History  Problem Relation Age of Onset   Alcohol abuse Paternal Grandfather    Tuberculosis Paternal Grandfather    Breast cancer Maternal Grandmother    Cancer Maternal Grandmother        breast   Heart attack Maternal Grandfather    Hyperlipidemia Father    Hypertension Father    Heart disease Father 99       CABG   Gout Father    Arthritis Mother    Heart disease Mother    Cancer Brother        colon, lung, liver   Hypertension Sister    Cancer Daughter        NEUROBLASTOMA   Social History   Socioeconomic History   Marital status: Married    Spouse name: Trey Paula   Number of children: 2   Years of education: 12   Highest education level: Some college, no degree   Occupational History    Comment: Disabled  Tobacco Use   Smoking status: Never   Smokeless tobacco: Never  Vaping Use   Vaping status: Never Used  Substance and Sexual Activity   Alcohol use: No   Drug use: No   Sexual activity: Yes    Birth control/protection: Surgical    Comment: tubal & ablation  Other Topics Concern   Not on file  Social History Narrative   Lives with her husband who helps with grocery shopping and other transportation needs.     Has two children, Billey Gosling and Clydie Braun and grandchildren - they all live nearby.   There are two outside dogs and several  Horses.    Social Drivers of Corporate investment banker Strain: Low Risk  (07/20/2023)   Overall Financial Resource Strain (CARDIA)    Difficulty of Paying Living Expenses: Not hard at all  Food Insecurity: No Food Insecurity (07/20/2023)   Hunger Vital Sign    Worried About Running Out of Food in the Last Year: Never true    Ran Out of Food in the Last Year: Never true  Transportation Needs: No Transportation Needs (07/20/2023)   PRAPARE - Administrator, Civil Service (Medical): No    Lack of Transportation (Non-Medical): No  Physical Activity: Inactive (07/20/2023)   Exercise Vital Sign    Days of Exercise per Week: 0 days    Minutes of Exercise per Session: 0 min  Stress: No Stress Concern Present (07/20/2023)   [employer] of Occupational Health - Occupational Stress Questionnaire    Feeling of Stress : Not at all  Social Connections: Socially Integrated (07/20/2023)   Social Connection and Isolation Panel [NHANES]    Frequency of Communication with Friends and Family: More than three times a week    Frequency of Social Gatherings with Friends and Family: Three times a week    Attends Religious Services: More than 4 times per year    Active Member of Clubs or Organizations: Yes    Attends Banker Meetings: More than 4 times per year    Marital Status: Married  Tobacco Counseling Counseling given: Not Answered   Clinical Intake:  Pre-visit preparation completed: Yes  Pain : No/denies pain     Diabetes: Yes CBG done?: No Did pt. bring in CBG monitor from home?: No  How often do you need to have someone help you when you read instructions, pamphlets, or other written materials from your doctor or pharmacy?: 1 - Never  Interpreter Needed?: No  Information entered by :: Kandis Fantasia LPN   Activities of Daily Living    07/17/2023    9:23 AM  In your present state of health, do you have any difficulty performing the following activities:  Hearing? 0  Vision? 0  Difficulty concentrating or making decisions? 0  Walking or climbing stairs? 1  Dressing or bathing? 0  Doing errands, shopping? 0  Preparing Food and eating ? N  Using the Toilet? N  In the past six months, have you accidently leaked urine? Y  Do you have problems with loss of bowel control? N  Managing your Medications? N  Managing your Finances? N  Housekeeping or managing your Housekeeping? N    Patient Care Team: [name], MD as PCP - General (Family Medicine) Cottle, Steva Ready., MD as Attending Physician (Psychiatry) Ranee Gosselin, MD as Consulting Physician (Orthopedic Surgery) Michaelle Copas, MD as Referring Physician (Optometry) Hunsucker, Lesia Sago, MD as Consulting Physician (Pulmonary Disease)  Indicate any recent Medical Services you may have received from other than [facility] providers in the past year (date may be approximate).     Assessment:   This is a routine wellness examination for Judeth.  Hearing/Vision screen Hearing Screening - Comments:: Denies hearing difficulties   Vision Screening - Comments:: Wears rx glasses - up to date with routine eye exams with Dr. Conley Rolls     Goals Addressed   None   Depression Screen    07/20/2023   12:43 PM 04/24/2023    2:12 PM 04/24/2023    2:09 PM 03/08/2023    1:57 PM 02/16/2023    2:07 PM  02/16/2023    2:00 PM 01/18/2023    2:12 PM  PHQ 2/9 Scores  PHQ - 2 Score 2 2 0 2 4 0 2  PHQ- 9 Score 4 4  2 8  8     Fall Risk    07/17/2023    9:23 AM 06/07/2023   11:18 AM 04/24/2023    2:11 PM 04/24/2023    2:09 PM 03/08/2023    1:57 PM  Fall Risk   Falls in the past year? 0 0 1 0 1  Number falls in past yr: 0  0  1  Injury with Fall? 0  0  0  Risk for fall due to :  History of fall(s) History of fall(s)    Follow up  Falls evaluation completed Falls evaluation completed      MEDICARE RISK AT HOME: Medicare Risk at Home Any stairs in or around the home?: (Patient-Rptd) Yes If so, are there any without handrails?: (Patient-Rptd) No Home free of loose throw rugs in walkways, pet beds, electrical cords, etc?: (Patient-Rptd) Yes Adequate lighting in your home to reduce risk of falls?: (Patient-Rptd) Yes Life alert?: (Patient-Rptd) No Use of a cane, walker or w/c?: (Patient-Rptd) No Grab bars in the bathroom?: (Patient-Rptd) Yes Shower chair or bench in shower?: (Patient-Rptd) Yes Elevated toilet seat or a handicapped toilet?: (Patient-Rptd) Yes  TIMED UP AND GO:  Was the test performed?  No  Cognitive Function:        07/20/2023   12:52 PM 07/14/2022   10:41 AM 07/13/2021   10:50 AM 07/10/2020    9:41 AM 07/09/2019   11:09 AM  6CIT Screen  What Year? 0 points 0 points 0 points 0 points 0 points  What month? 0 points 0 points 0 points 0 points 0 points  What time? 0 points 0 points 0 points 0 points 0 points  Count back from 20 0 points 0 points 0 points 0 points 0 points  Months in reverse 0 points 0 points 0 points 0 points 2 points  Repeat phrase 0 points 0 points 4 points 0 points 2 points  Total Score 0 points 0 points 4 points 0 points 4 points    Immunizations Immunization History  Administered Date(s) Administered   DTaP 07/25/2013   Influenza Inj Mdck Quad Pf 04/29/2021, 03/25/2022   Influenza Split 05/08/2013   Influenza, High Dose Seasonal PF  07/07/2014, 05/14/2018, 04/02/2019   Influenza, Mdck, Trivalent,PF 6+ MOS(egg free) 03/30/2023   Influenza, Quadrivalent, Recombinant, Inj, Pf 05/14/2018   Influenza, Seasonal, Injecte, Preservative Fre 07/07/2014   Influenza,inj,Quad PF,6+ Mos 07/07/2014, 07/07/2014, 05/14/2018, 04/02/2019, 03/31/2020   Influenza,trivalent, recombinat, inj, PF 05/08/2013   Influenza-Unspecified 02/23/2016, 05/25/2021   Moderna Covid-19 Fall Seasonal Vaccine 87yrs & older 03/30/2023   Moderna Covid-19 Vaccine Bivalent Booster 32yrs & up 06/01/2021   Moderna SARS-COV2 Booster Vaccination 12/15/2020   Moderna Sars-Covid-2 Vaccination 11/14/2019, 12/12/2019, 06/13/2020   PNEUMOCOCCAL CONJUGATE-20 05/24/2021   Pneumococcal Conjugate-13 12/27/2010   Pneumococcal Polysaccharide-23 05/04/2011   Pneumococcal-Unspecified 05/04/2011, 05/25/2021   Tdap 02/25/2013   Unspecified SARS-COV-2 Vaccination 04/26/2022   Zoster Recombinant(Shingrix) 08/25/2021, 01/27/2022    TDAP status: Up to date  Flu Vaccine status: Up to date  Pneumococcal vaccine status: Up to date  Covid-19 vaccine status: Information provided on how to obtain vaccines.   Qualifies for Shingles Vaccine? Yes   Zostavax completed No   Shingrix Completed?: Yes  Screening Tests Health Maintenance  Topic Date Due   FOOT EXAM  Never done   OPHTHALMOLOGY EXAM  Never done   Diabetic kidney evaluation - Urine ACR  Never done   Colonoscopy  09/25/2021   DTaP/Tdap/Td (3 - Td or Tdap) 07/26/2023   HEMOGLOBIN A1C  10/22/2023   MAMMOGRAM  01/30/2024   COLON CANCER SCREENING ANNUAL FOBT  03/07/2024   Diabetic kidney evaluation - eGFR measurement  04/23/2024   Medicare Annual Wellness (AWV)  07/19/2024   Cervical Cancer Screening (HPV/Pap Cotest)  03/07/2028   INFLUENZA VACCINE  Completed   COVID-19 Vaccine  Completed   Hepatitis C Screening  Completed   HIV Screening  Completed   Zoster Vaccines- Shingrix  Completed   HPV VACCINES  Aged Out     Health Maintenance  Health Maintenance Due  Topic Date Due   FOOT EXAM  Never done   OPHTHALMOLOGY EXAM  Never done   Diabetic kidney evaluation - Urine ACR  Never done   Colonoscopy  09/25/2021    Colorectal cancer screening: Type of screening: FOBT/FIT. Completed 03/08/23. Repeat every 1 years  Mammogram status: Completed 01/30/23. Repeat every year  Lung Cancer Screening: (Low Dose CT Chest recommended if Age 65-80 years, 20 pack-year currently smoking OR have quit w/in 15years.) does not qualify.   Lung Cancer Screening Referral: n/a  Additional Screening:  Hepatitis C Screening: does qualify; Completed 06/24/15  Vision Screening: Recommended annual ophthalmology exams for early detection of glaucoma and other  disorders of the eye. Is the patient up to date with their annual eye exam?  Yes  Who is the provider or what is the name of the office in which the patient attends annual eye exams? Dr. Conley Rolls  If pt is not established with a provider, would they like to be referred to a provider to establish care? No .   Dental Screening: Recommended annual dental exams for proper oral hygiene  Diabetic Foot Exam: Diabetic Foot Exam: Overdue, Pt has been advised about the importance in completing this exam. Pt is scheduled for diabetic foot exam on at next office visit.  Community Resource Referral / Chronic Care Management: CRR required this visit?  No   CCM required this visit?  No     Plan:     I have personally reviewed and noted the following in the patient's chart:   Medical and social history Use of alcohol, tobacco or illicit drugs  Current medications and supplements including opioid prescriptions. Patient is not currently taking opioid prescriptions. Functional ability and status Nutritional status Physical activity Advanced directives List of other physicians Hospitalizations, surgeries, and ER visits in previous 12 months Vitals Screenings to include  cognitive, depression, and falls Referrals and appointments  In addition, I have reviewed and discussed with patient certain preventive protocols, quality metrics, and best practice recommendations. A written personalized care plan for preventive services as well as general preventive health recommendations were provided to patient.     Kandis Fantasia [place], [place]   [id number]   After Visit Summary: (MyChart) Due to this being a telephonic visit, the after visit summary with patients personalized plan was offered to patient via MyChart   Nurse Notes: No concerns at this time

## 2023-07-24 ENCOUNTER — Other Ambulatory Visit: Payer: Self-pay | Admitting: Family Medicine

## 2023-07-24 ENCOUNTER — Ambulatory Visit: Payer: Medicare HMO | Admitting: Family Medicine

## 2023-07-24 VITALS — BP 122/68 | HR 88 | Temp 97.3°F | Ht 64.0 in | Wt 352.0 lb

## 2023-07-24 DIAGNOSIS — E756 Lipid storage disorder, unspecified: Secondary | ICD-10-CM

## 2023-07-24 DIAGNOSIS — E1169 Type 2 diabetes mellitus with other specified complication: Secondary | ICD-10-CM | POA: Diagnosis not present

## 2023-07-24 DIAGNOSIS — E119 Type 2 diabetes mellitus without complications: Secondary | ICD-10-CM | POA: Diagnosis not present

## 2023-07-24 DIAGNOSIS — E039 Hypothyroidism, unspecified: Secondary | ICD-10-CM | POA: Diagnosis not present

## 2023-07-24 DIAGNOSIS — I10 Essential (primary) hypertension: Secondary | ICD-10-CM | POA: Diagnosis not present

## 2023-07-24 DIAGNOSIS — Z7984 Long term (current) use of oral hypoglycemic drugs: Secondary | ICD-10-CM

## 2023-07-24 DIAGNOSIS — E782 Mixed hyperlipidemia: Secondary | ICD-10-CM

## 2023-07-24 LAB — BAYER DCA HB A1C WAIVED: HB A1C (BAYER DCA - WAIVED): 6 % — ABNORMAL HIGH (ref 4.8–5.6)

## 2023-07-24 NOTE — Progress Notes (Unsigned)
Subjective:  Patient ID: [name], female    DOB: 05-22-1961  Age: 62 y.o. MRN: [id number]  CC: Medical Management of Chronic Issues   HPI DENNISSE AVERBECK presents forFollow-up of diabetes. Patient checks blood sugar at home.   109 average without any lows. Worst was 152. Patient denies symptoms such as polyuria, polydipsia, excessive hunger, nausea No significant hypoglycemic spells noted. Medications reviewed. Pt reports taking them regularly without complication/adverse reaction being reported today.    follow-up on  thyroid. The patient has a history of hypothyroidism for many years. It has been stable recently. Pt. denies any change in  voice, loss of hair, heat or cold intolerance. Energy level has been adequate "Tolerable." . Patient denies constipation and diarrhea. No myxedema. Medication is as noted below. Verified that pt is taking it daily on an empty stomach. Well tolerated.  History Teosha has a past medical history of Anemia, Aneurysmal bone cyst, other site (10/22/2013), Anxiety, ARF (acute renal failure) (HCC) (07/16/2019), Arthritis, Asthma, Colon polyps, Complex tear of medial meniscus of right knee as current injury (10/22/2013), Depression, Diabetes mellitus without complication (HCC), GERD (gastroesophageal reflux disease), Headache, High cholesterol, Hypertension, Lithium toxicity (06/2019), PONV (postoperative nausea and vomiting), Primary insomnia (08/20/2019), Shortness of breath dyspnea, Sleep apnea, Syncope and collapse, and Total knee replacement status (04/16/2014).   She has a past surgical history that includes Endometrial ablation w/ novasure; Carpal tunnel release (07/25/2005); trigger finger release surgeyr ; Tubal ligation; Knee arthroscopy; Knee arthroscopy with medial menisectomy (Right, 10/22/2013); Back surgery (07/25/2010); Colonoscopy; Excision metacarpal mass (Right, 03/10/2014); Total knee arthroplasty (Left, 04/16/2014); Irrigation and debridement  knee (Left, 05/23/2014); Total knee arthroplasty (Right, 12/23/2014); Excisional total knee arthroplasty with antibiotic spacers (Left, 09/19/2019); Joint replacement; and Spine surgery.   Her family history includes Alcohol abuse in her paternal grandfather; Arthritis in her mother; Breast cancer in her maternal grandmother; Cancer in her brother, daughter, and maternal grandmother; Gout in her father; Heart attack in her maternal grandfather; Heart disease in her mother; Heart disease (age of onset: 52) in her father; Hyperlipidemia in her father; Hypertension in her father and sister; Tuberculosis in her paternal grandfather.She reports that she has never smoked. She has never used smokeless tobacco. She reports that she does not drink alcohol and does not use drugs.  Current Outpatient Medications on File Prior to Visit  Medication Sig Dispense Refill   albuterol (VENTOLIN HFA) 108 (90 Base) MCG/ACT inhaler Inhale 1-2 puffs into the lungs every 6 (six) hours as needed for wheezing or shortness of breath. 1 each 11   Budeson-Glycopyrrol-Formoterol (BREZTRI AEROSPHERE) 160-9-4.8 MCG/ACT AERO Inhale 2 puffs into the lungs in the morning and at bedtime. 1 each 12   fluocinonide cream (LIDEX) 0.05 % Apply 1 Application topically 2 (two) times daily. 30 g 5   fluticasone (FLONASE) 50 MCG/ACT nasal spray Place 2 sprays into both nostrils daily. 16 g 6   furosemide (LASIX) 40 MG tablet TAKE 1 TABLET BY MOUTH EVERY DAY 90 tablet 0   gabapentin (NEURONTIN) 300 MG capsule Take 1 capsule by mouth 2 (two) times daily.     Glucose Blood (BLOOD GLUCOSE TEST STRIPS) STRP Check BS in the morning, at noon and at bedtime Dx R73.03 300 strip 3   Lancets (ONETOUCH DELICA PLUS LANCET33G) MISC Check BS in morning, at noon and at bedtime Dx R73.03 300 each 3   levothyroxine (SYNTHROID) 100 MCG tablet TAKE 1 TABLET BY MOUTH EVERY DAY BEFORE BREAKFAST 90 tablet 3  losartan (COZAAR) 50 MG tablet Take 1 tablet (50 mg total)  by mouth daily. 90 tablet 3   Mepolizumab (NUCALA) 100 MG/ML SOAJ Inject 1 mL (100 mg total) into the skin every 28 (twenty-eight) days. Faxed w patient assistance application 1 mL 5   metFORMIN (GLUCOPHAGE-XR) 500 MG 24 hr tablet TAKE 1 TABLET BY MOUTH EVERY DAY WITH BREAKFAST 90 tablet 0   methocarbamol (ROBAXIN) 500 MG tablet Take 500 mg by mouth every 6 (six) hours as needed for muscle spasms.     montelukast (SINGULAIR) 10 MG tablet TAKE 1 TABLET BY MOUTH EVERYDAY AT BEDTIME 90 tablet 1   omeprazole (PRILOSEC) 40 MG capsule TAKE 1 CAPSULE BY MOUTH EVERY DAY 90 capsule 3   promethazine (PHENERGAN) 25 MG tablet TAKE 1 TABLET BY MOUTH EVERY 6 HOURS AS NEEDED FOR NAUSEA AND VOMITING 90 tablet 5   rosuvastatin (CRESTOR) 40 MG tablet TAKE 1 TABLET BY MOUTH EVERY DAY FOR CHOLESTEROL 90 tablet 0   sertraline (ZOLOFT) 50 MG tablet TAKE 1 TABLET BY MOUTH EVERYDAY AT BEDTIME 90 tablet 0   traZODone (DESYREL) 100 MG tablet TAKE 3 TABLETS (300MG ) BY MOUTH EVERY DAY AT BEDTIME 270 tablet 0   triamcinolone cream (KENALOG) 0.1 % Apply 1 Application topically 2 (two) times daily. 30 g 0   VITAMIN D PO Take by mouth.     No current facility-administered medications on file prior to visit.    ROS Review of Systems  Objective:  BP (!) 159/91   Pulse 88   Temp (!) 97.3 F (36.3 C)   Ht 5\' 4"  (1.626 m)   Wt (!) 352 lb (159.7 kg)   SpO2 98%   BMI 60.42 kg/m   BP Readings from Last 3 Encounters:  07/24/23 (!) 159/91  06/07/23 (!) 162/92  05/12/23 (!) 142/80    Wt Readings from Last 3 Encounters:  07/24/23 (!) 352 lb (159.7 kg)  07/20/23 (!) 346 lb (156.9 kg)  06/07/23 (!) 346 lb (156.9 kg)     Physical Exam    Assessment & Plan:   Serenna was seen today for medical management of chronic issues.  Diagnoses and all orders for this visit:  Diabetic lipidosis (HCC) -     Bayer DCA Hb A1c Waived  Essential hypertension, benign -     CBC with Differential/Platelet -      CMP14+EGFR  Hypothyroidism, unspecified type -     TSH + free T4  Mixed hyperlipidemia -     Lipid panel      I have discontinued [name]. Giebler's Blood Glucose Monitoring Suppl and benzonatate. I am also having her maintain her methocarbamol, gabapentin, omeprazole, losartan, albuterol, promethazine, levothyroxine, fluocinonide cream, BLOOD GLUCOSE TEST STRIPS, OneTouch Delica Plus Lancet33G, VITAMIN D PO, fluticasone, triamcinolone cream, Breztri Aerosphere, Nucala, furosemide, traZODone, rosuvastatin, metFORMIN, montelukast, sertraline, and nabumetone.  No orders of the defined types were placed in this encounter.    Follow-up: Return in about 3 months (around 10/22/2023) for diabetes.  [name], M.D.

## 2023-07-25 LAB — CBC WITH DIFFERENTIAL/PLATELET
Basophils Absolute: 0 10*3/uL (ref 0.0–0.2)
Basos: 1 %
EOS (ABSOLUTE): 0.1 10*3/uL (ref 0.0–0.4)
Eos: 1 %
Hematocrit: 40.9 % (ref 34.0–46.6)
Hemoglobin: 13.3 g/dL (ref 11.1–15.9)
Immature Grans (Abs): 0 10*3/uL (ref 0.0–0.1)
Immature Granulocytes: 0 %
Lymphocytes Absolute: 1.4 10*3/uL (ref 0.7–3.1)
Lymphs: 22 %
MCH: 31.6 pg (ref 26.6–33.0)
MCHC: 32.5 g/dL (ref 31.5–35.7)
MCV: 97 fL (ref 79–97)
Monocytes Absolute: 0.4 10*3/uL (ref 0.1–0.9)
Monocytes: 6 %
Neutrophils Absolute: 4.7 10*3/uL (ref 1.4–7.0)
Neutrophils: 70 %
Platelets: 162 10*3/uL (ref 150–450)
RBC: 4.21 x10E6/uL (ref 3.77–5.28)
RDW: 13.4 % (ref 11.7–15.4)
WBC: 6.6 10*3/uL (ref 3.4–10.8)

## 2023-07-25 LAB — CMP14+EGFR
ALT: 25 [IU]/L (ref 0–32)
AST: 24 [IU]/L (ref 0–40)
Albumin: 4 g/dL (ref 3.8–4.9)
Alkaline Phosphatase: 116 [IU]/L (ref 44–121)
BUN/Creatinine Ratio: 18 (ref 12–28)
BUN: 20 mg/dL (ref 8–27)
Bilirubin Total: 0.3 mg/dL (ref 0.0–1.2)
CO2: 24 mmol/L (ref 20–29)
Calcium: 9.3 mg/dL (ref 8.7–10.3)
Chloride: 103 mmol/L (ref 96–106)
Creatinine, Ser: 1.12 mg/dL — ABNORMAL HIGH (ref 0.57–1.00)
Globulin, Total: 2.9 g/dL (ref 1.5–4.5)
Glucose: 163 mg/dL — ABNORMAL HIGH (ref 70–99)
Potassium: 4.7 mmol/L (ref 3.5–5.2)
Sodium: 141 mmol/L (ref 134–144)
Total Protein: 6.9 g/dL (ref 6.0–8.5)
eGFR: 56 mL/min/{1.73_m2} — ABNORMAL LOW (ref >59)

## 2023-07-25 LAB — LIPID PANEL
Chol/HDL Ratio: 4.2 {ratio} (ref 0.0–4.4)
Cholesterol, Total: 138 mg/dL (ref 100–199)
HDL: 33 mg/dL — ABNORMAL LOW (ref >39)
LDL Chol Calc (NIH): 66 mg/dL (ref 0–99)
Triglycerides: 237 mg/dL — ABNORMAL HIGH (ref 0–149)
VLDL Cholesterol Cal: 39 mg/dL (ref 5–40)

## 2023-07-25 LAB — TSH+FREE T4
Free T4: 1.36 ng/dL (ref 0.82–1.77)
TSH: 1.92 u[IU]/mL (ref 0.450–4.500)

## 2023-08-05 ENCOUNTER — Encounter: Payer: Self-pay | Admitting: Family Medicine

## 2023-08-09 ENCOUNTER — Encounter: Payer: Self-pay | Admitting: Nurse Practitioner

## 2023-08-09 ENCOUNTER — Ambulatory Visit: Payer: Medicare HMO | Admitting: Nurse Practitioner

## 2023-08-09 VITALS — BP 128/79 | HR 100 | Temp 97.7°F | Ht 64.0 in | Wt 350.0 lb

## 2023-08-09 DIAGNOSIS — N3001 Acute cystitis with hematuria: Secondary | ICD-10-CM | POA: Insufficient documentation

## 2023-08-09 DIAGNOSIS — R300 Dysuria: Secondary | ICD-10-CM | POA: Diagnosis not present

## 2023-08-09 LAB — URINALYSIS, ROUTINE W REFLEX MICROSCOPIC
Bilirubin, UA: NEGATIVE
Glucose, UA: NEGATIVE
Nitrite, UA: POSITIVE — AB
Specific Gravity, UA: >1.03 — ABNORMAL HIGH (ref 1.005–1.030)
Urobilinogen, Ur: 0.2 mg/dL (ref 0.2–1.0)
pH, UA: 5.5 (ref 5.0–7.5)

## 2023-08-09 LAB — MICROSCOPIC EXAMINATION
RBC, Urine: >30 /[HPF] — AB (ref 0–2)
Renal Epithel, UA: NONE SEEN /[HPF]
WBC, UA: >30 /[HPF] — AB (ref 0–5)

## 2023-08-09 MED ORDER — DOXYCYCLINE HYCLATE 100 MG PO CAPS: 100.0000 mg | ORAL_CAPSULE | Freq: Two times a day (BID) | ORAL | 0 refills | Status: DC

## 2023-08-09 NOTE — Progress Notes (Signed)
 Acute Office Visit  Subjective:     Patient ID: [name], female    DOB: August 27, 1960, 63 y.o.   MRN: [id number]  Chief Complaint  Patient presents with   Dysuria    Cloudy urine,pain, and odor for about a week    HPI SUBJECTIVE: [name] is a 63 y.o. female who complains of urinary frequency, urgency and dysuria x 7 days, urgency, frequency, cloudiness, dysuria " I am having to wear poise pads to catch the leak". Denies  flank pain, fever, chills, or abnormal vaginal discharge or bleeding.    Active Ambulatory Problems    Diagnosis Date Noted   Depression    Migraines    Shortness of breath 10/02/2012   Asthma, mild intermittent 10/26/2012   Osteoarthritis 10/26/2012   History of colonic polyps 10/26/2012   GERD (gastroesophageal reflux disease) 10/26/2012   Essential hypertension, benign 10/26/2012   Chronic insomnia 10/27/2012   Osteoarthritis of right knee 10/22/2013   Borderline diabetes 10/21/2014   Transaminasemia 10/21/2014   History of total knee arthroplasty 12/23/2014   Morbid obesity with BMI of 50.0-59.9, adult (HCC) 01/19/2016   Bipolar disorder, in partial remission, most recent episode mixed (HCC) 04/03/2019   Lumbar radiculopathy 04/03/2019   Internal derangement of multiple sites of knee 04/03/2019   Hypothyroidism 06/05/2019   Overactive bladder 08/20/2019   S/P left TKA revision 09/19/2019   Acquired absence of knee joint following explantation of joint prosthesis with presence of antibiotic-impregnated cement spacer 09/19/2019   Mixed hyperlipidemia 12/03/2019   Nausea 11/30/2020   Mass of soft tissue of hand 01/01/2020   Pain in left knee 08/29/2019   Right hand pain 01/01/2020   Encounter for screening fecal occult blood testing 03/08/2023   Encounter for gynecological examination with Papanicolaou smear of cervix 03/08/2023   Acute cystitis with hematuria 08/09/2023   Dysuria 08/09/2023   Resolved Ambulatory Problems    Diagnosis  Date Noted   High cholesterol    Precordial pain 10/02/2012   Aneurysmal bone cyst, 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   Complex tear of medial meniscus of right knee as current injury 10/22/2013   Total knee replacement status 04/16/2014   Infection of total left knee replacement (HCC) 05/23/2014   Moderate persistent asthma with acute exacerbation 04/03/2019   Primary insomnia 08/20/2019   Subacute frontal sinusitis 11/30/2020   Acute cystitis with hematuria 07/16/2019   Acute metabolic encephalopathy 07/16/2019   Acute respiratory failure with hypoxia (HCC) 07/16/2019   ARF (acute renal failure) (HCC) 07/16/2019   Lithium  toxicity 07/16/2019   Past Medical History:  Diagnosis Date   Anemia    Anxiety    Arthritis    Asthma    Colon polyps    Diabetes mellitus without complication (HCC)    Headache    Hypertension    PONV (postoperative nausea and vomiting)    Shortness of breath dyspnea    Sleep apnea    Syncope and collapse     ROS Negative unless indicated in HPI    Objective:    BP 128/79   Pulse 100   Temp 97.7 F (36.5 C) (Temporal)   Ht 5\' 4"  (1.626 m)   Wt (!) 350 lb (158.8 kg)   SpO2 90%   BMI 60.08 kg/m  BP Readings from Last 3 Encounters:  08/09/23 128/79  07/24/23 122/68  06/07/23 (!) 162/92   Wt Readings from Last 3 Encounters:  08/09/23 (!) 350 lb (158.8 kg)  07/24/23 (!) 352  lb (159.7 kg)  07/20/23 (!) 346 lb (156.9 kg)      Physical Exam Appears well, in no apparent distress.  Vital signs are normal. The abdomen is soft without tenderness, guarding, mass, rebound or organomegaly. No CVA tenderness or inguinal adenopathy noted.  Urine dipstick shows positive for RBC's, positive for protein, positive for nitrates, positive for leukocytes, and positive for ketones.  Micro exam: Greater than 30 WBC's per HPF, greater than 30 RBC's per HPF, epithelial cells 0-10, many + bacteria, and yeast No results found for any visits on 08/09/23.      Assessment &  Plan:  Dysuria -     Urinalysis, Routine w reflex microscopic -     Doxycycline  Hyclate; Take 1 capsule (100 mg total) by mouth 2 (two) times daily.  Dispense: 14 capsule; Refill: 0  Acute cystitis with hematuria -     Doxycycline  Hyclate; Take 1 capsule (100 mg total) by mouth 2 (two) times daily.  Dispense: 14 capsule; Refill: 0  Early is a 63 year old Caucasian female seen today for acute cystitis, no acute distress Acute cystitis: Will treat with broad-spectrum antibiotic doxycycline  100 mg twice daily and although culture Client understand becomes based on culture result and antibiotic may be needed A increase hydration,  may use Pyridium OTC prn. Call or return to clinic prn if these symptoms worsen or fail to improve as anticipated.   Continue healthy lifestyle choices, including diet (rich in fruits, vegetables, and lean proteins, and low in salt and simple carbohydrates) and exercise (at least 30 minutes of moderate physical activity daily).     The above assessment and management plan was discussed with the patient. The patient verbalized understanding of and has agreed to the management plan. Patient is aware to call the clinic if they develop any new symptoms or if symptoms persist or worsen. Patient is aware when to return to the clinic for a follow-up visit. Patient educated on when it is appropriate to go to the emergency department.  Return if symptoms worsen or fail to improve.  Ajai Harville [place] Thompson, DNP [facility] [address] [place], [place] [address] [phone]  Note: This document was prepared by [employer] voice dictation technology and any errors that results from this process are unintentional.

## 2023-08-14 NOTE — Telephone Encounter (Signed)
Received fx from [employer] requesting renewal applicastion. Refaxed renewal application that I had previously faxed on 06/29/2023

## 2023-08-18 ENCOUNTER — Telehealth: Payer: Medicare HMO | Admitting: Family Medicine

## 2023-08-18 DIAGNOSIS — N390 Urinary tract infection, site not specified: Secondary | ICD-10-CM | POA: Diagnosis not present

## 2023-08-18 MED ORDER — NITROFURANTOIN MONOHYD MACRO 100 MG PO CAPS: 100.0000 mg | ORAL_CAPSULE | Freq: Two times a day (BID) | ORAL | 0 refills | Status: AC

## 2023-08-18 NOTE — Progress Notes (Signed)

## 2023-08-25 NOTE — Telephone Encounter (Signed)
Received a fax from  [facility] regarding an approval for N[employer]A patient assistance from 07/26/2023 to 07/24/2024. Approval letter sent to scan center.  Phone #: [phone] Fax #: [phone]  Attempted to call pt and provide update, however her VM is full and cannot accept any new messages. Will update pt via MyChart.

## 2023-09-18 ENCOUNTER — Encounter: Payer: Self-pay | Admitting: Pulmonary Disease

## 2023-09-18 ENCOUNTER — Ambulatory Visit: Payer: Medicare HMO | Admitting: Pulmonary Disease

## 2023-09-18 ENCOUNTER — Telehealth: Payer: Self-pay | Admitting: Pulmonary Disease

## 2023-09-18 VITALS — BP 140/80 | HR 79 | Ht 64.0 in | Wt 357.6 lb

## 2023-09-18 DIAGNOSIS — J4550 Severe persistent asthma, uncomplicated: Secondary | ICD-10-CM | POA: Diagnosis not present

## 2023-09-18 NOTE — Progress Notes (Signed)
 Synopsis: Referred for cough, dyspnea, possible history of asthma by [name], MD  Subjective:   PATIENT ID: [name] GENDER: female DOB: 1960/11/10, MRN: [id number]  Chief Complaint  Patient presents with   Follow-up    Pt is having sob during exertion w/ wheezing. Pt is having some coughing w/ phlegm in mornings (unsure of color). Left side chest pain x2 wks (aching pain)   63 y.o. woman with history of asthma whom are seen for evaluation of shortness of breath and cough felt related to poorly controlled asthma.  Doing a bit better on Nucala.  Still with significant cough and shortness of breath.  A bit better but not totally.  Still some wheezing.  We discussed shortness of breath and wheezing could be related to bronchomalacia seen on CT scan on review 2020.  Personally reviewed with patient in room today.  Not a good solution for this.  HPI initial visit: Seen by PCP 8/5. Maintained on singulair, trelegy and started on prednisone taper, levaquin. She doesn't note much difference with trelegy as opposed to breo.   She says she has DOE that's been on and off, but lately it has been progressive over last 3 months. DOE to 50 ft currently. She has some accompanying chest pain that is worse with coughing. Pleuritic after a bad coughing spell. Had bronchitis about 3 weeks ago given above tx, she feels tessalon perles were probably more helpful than prednisone. She never took the levaquin. She doesn't note any worsening orthopnea - she always feels smothered when she lies on her back. Some worsening swelling in R>L foot.   Smoked very little when she was in middle school. She works for [employer] in [place] division. 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 She has lived in [place] for a period. She has a cat. No pet bird or hot tub.  No first degree relatives with lung disease.   Interval HPI:  PSG not scheduled, PFTs with mild  restriction today. Had been off of breztri for a month prior to PFT. Has felt much worse off of breztri. Increased DOE. Some cough but not especially bothersome, only when she first gets up.   She is using her albuterol inhaler maybe once weekly currently.    Otherwise pertinent review of systems is negative.  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   Anxiety    ARF (acute renal failure) (HCC) 07/16/2019   Arthritis    Asthma    Colon polyps    Complex tear of medial meniscus of right knee as current injury 10/22/2013   Depression    Diabetes mellitus without complication (HCC)    GERD (gastroesophageal reflux disease)    Headache    occasional headaches    High cholesterol    Hypertension    Lithium toxicity 06/2019   has since resolved after discontinuing    PONV (postoperative nausea and vomiting)    Primary insomnia 08/20/2019   Shortness of breath dyspnea    due to weight gain    Sleep apnea    has not use CPAP machine in 2 yrs/ DOES NOT KNOW IF SHE NEEDS TO USE C - PAP   Syncope and collapse    due to severe knee pain    Total knee replacement status 04/16/2014     Family History  Problem Relation Age of Onset   Alcohol abuse Paternal Grandfather    Tuberculosis Paternal Grandfather    Breast cancer Maternal Grandmother    Cancer Maternal Grandmother        breast   Heart attack Maternal Grandfather    Hyperlipidemia Father    Hypertension Father    Heart disease Father 75       CABG   Gout Father    Arthritis Mother    Heart disease Mother    Cancer Brother        colon, lung, liver   Hypertension Sister    Cancer Daughter        NEUROBLASTOMA     Past Surgical History:  Procedure Laterality Date   BACK SURGERY  07/25/2010   lumb fusion   CARPAL TUNNEL RELEASE  07/25/2005   both hands   COLONOSCOPY     ENDOMETRIAL ABLATION W/ NOVASURE     EXCISION METACARPAL MASS  Right 03/10/2014   Procedure: EXCISION MASS RIGHT HAND FIRST WEB SPACE DORSAL PALMAR INCISION ;  Surgeon: Cindee Salt, MD;  Location: [facility];  Service: Orthopedics;  Laterality: Right;   EXCISIONAL TOTAL KNEE ARTHROPLASTY WITH ANTIBIOTIC SPACERS Left 09/19/2019   Procedure: EXCISIONAL  LEFT TOTAL KNEE ARTHROPLASTY WITH TOTAL KNEE REVISION;  Surgeon: Durene Romans, MD;  Location: [facility];  Service: Orthopedics;  Laterality: Left;  2 hrs   IRRIGATION AND DEBRIDEMENT KNEE Left 05/23/2014   Procedure: IRRIGATION AND DEBRIDEMENT KNEE;  Surgeon: [name], MD;  Location: [facility];  Service: Orthopedics;  Laterality: Left;  With POLYETHYLENE EXCHANGE   JOINT REPLACEMENT     KNEE ARTHROSCOPY     let knee 03/2013    KNEE ARTHROSCOPY WITH MEDIAL MENISECTOMY Right 10/22/2013   Procedure: RIGHT KNEE ARTHROSCOPY WITH MEDIAL MENISECTOMY, abrasion chrondrplasty Brigid Re OUT/CORRECT CURETTEMENT/BONE GRAFT/PROXIMAL TIBIA ;  Surgeon: Jacki Cones, MD;  Location: [facility];  Service: Orthopedics;  Laterality: Right;   SPINE SURGERY     TOTAL KNEE ARTHROPLASTY Left 04/16/2014   Procedure: LEFT TOTAL KNEE ARTHROPLASTY;  Surgeon: Jacki Cones, MD;  Location: [facility];  Service: Orthopedics;  Laterality: Left;   TOTAL KNEE ARTHROPLASTY Right 12/23/2014   Procedure: TOTAL KNEE ARTHROPLASTY;  Surgeon: Ranee Gosselin, MD;  Location: [facility];  Service: Orthopedics;  Laterality: Right;   trigger finger release surgeyr      bilateral    TUBAL LIGATION      Social History   Socioeconomic History   Marital status: Married    Spouse name: Trey Paula   Number of children: 2   Years of education: 12   Highest education level: Some college, no degree  Occupational History    Comment: Disabled  Tobacco Use   Smoking status: Never   Smokeless tobacco: Never  Vaping Use   Vaping status: Never Used  Substance and Sexual Activity   Alcohol use: No   Drug use: No   Sexual activity: Yes    Birth  control/protection: Surgical    Comment: tubal & ablation  Other Topics Concern   Not on file  Social History Narrative   Lives with her husband who helps with grocery shopping and other transportation needs.     Has two children, Billey Gosling and Clydie Braun and grandchildren - they all live nearby.   There are two outside dogs and several  Horses.    Social Drivers of Health   Financial Resource Strain: Low Risk  (07/24/2023)   Overall Financial  Resource Strain (CARDIA)    Difficulty of Paying Living Expenses: Not very hard  Food Insecurity: No Food Insecurity (07/24/2023)   Hunger Vital Sign    Worried About Running Out of Food in the Last Year: Never true    Ran Out of Food in the Last Year: Never true  Transportation Needs: No Transportation Needs (07/24/2023)   PRAPARE - Administrator, Civil Service (Medical): No    Lack of Transportation (Non-Medical): No  Physical Activity: Inactive (07/24/2023)   Exercise Vital Sign    Days of Exercise per Week: 0 days    Minutes of Exercise per Session: 0 min  Stress: No Stress Concern Present (07/24/2023)   [employer] of Occupational Health - Occupational Stress Questionnaire    Feeling of Stress : Only a little  Social Connections: Socially Integrated (07/24/2023)   Social Connection and Isolation Panel [NHANES]    Frequency of Communication with Friends and Family: Twice a week    Frequency of Social Gatherings with Friends and Family: Three times a week    Attends Religious Services: More than 4 times per year    Active Member of Clubs or Organizations: Yes    Attends Banker Meetings: More than 4 times per year    Marital Status: Married  Catering manager Violence: Not At Risk (07/20/2023)   Humiliation, Afraid, Rape, and Kick questionnaire    Fear of Current or Ex-Partner: No    Emotionally Abused: No    Physically Abused: No    Sexually Abused: No     Allergies  Allergen Reactions   Chocolate  Flavoring Agent (Non-Screening)     Other reaction(s): headache   Other Itching and Rash    Other reaction(s): other   Adhesive [Tape] Other (See Comments)    Peeled off skin   Ceclor [Cefaclor] Other (See Comments)    Blisters on hands   Chocolate Other (See Comments)    migraines   Venlafaxine Other (See Comments)    "refuses to take - makes me feel like I am dying"   Lisinopril Cough     Outpatient Medications Prior to Visit  Medication Sig Dispense Refill   albuterol (VENTOLIN HFA) 108 (90 Base) MCG/ACT inhaler Inhale 1-2 puffs into the lungs every 6 (six) hours as needed for wheezing or shortness of breath. 1 each 11   fluocinonide cream (LIDEX) 0.05 % Apply 1 Application topically 2 (two) times daily. 30 g 5   fluticasone (FLONASE) 50 MCG/ACT nasal spray Place 2 sprays into both nostrils daily. 16 g 6   gabapentin (NEURONTIN) 300 MG capsule Take 1 capsule by mouth 2 (two) times daily.     Glucose Blood (BLOOD GLUCOSE TEST STRIPS) STRP Check BS in the morning, at noon and at bedtime Dx R73.03 300 strip 3   Lancets (ONETOUCH DELICA PLUS LANCET33G) MISC Check BS in morning, at noon and at bedtime Dx R73.03 300 each 3   levothyroxine (SYNTHROID) 100 MCG tablet TAKE 1 TABLET BY MOUTH EVERY DAY BEFORE BREAKFAST 90 tablet 3   losartan (COZAAR) 50 MG tablet Take 1 tablet (50 mg total) by mouth daily. 90 tablet 3   Mepolizumab (NUCALA) 100 MG/ML SOAJ Inject 1 mL (100 mg total) into the skin every 28 (twenty-eight) days. Faxed w patient assistance application 1 mL 5   metFORMIN (GLUCOPHAGE-XR) 500 MG 24 hr tablet TAKE 1 TABLET BY MOUTH EVERY DAY WITH BREAKFAST 90 tablet 0   methocarbamol (ROBAXIN) 500 MG tablet  Take 500 mg by mouth every 6 (six) hours as needed for muscle spasms.     montelukast (SINGULAIR) 10 MG tablet TAKE 1 TABLET BY MOUTH EVERYDAY AT BEDTIME 90 tablet 1   nabumetone (RELAFEN) 500 MG tablet TAKE 2 TABLETS (1,000 MG TOTAL) BY MOUTH 2 (TWO) TIMES DAILY. FOR MUSCLE AND JOINT  PAIN 360 tablet 0   promethazine (PHENERGAN) 25 MG tablet TAKE 1 TABLET BY MOUTH EVERY 6 HOURS AS NEEDED FOR NAUSEA AND VOMITING 90 tablet 5   rosuvastatin (CRESTOR) 40 MG tablet TAKE 1 TABLET BY MOUTH EVERY DAY FOR CHOLESTEROL 90 tablet 0   sertraline (ZOLOFT) 50 MG tablet TAKE 1 TABLET BY MOUTH EVERYDAY AT BEDTIME 90 tablet 0   traZODone (DESYREL) 100 MG tablet TAKE 3 TABLETS (300MG ) BY MOUTH EVERY DAY AT BEDTIME 270 tablet 0   VITAMIN D PO Take by mouth.     Budeson-Glycopyrrol-Formoterol (BREZTRI AEROSPHERE) 160-9-4.8 MCG/ACT AERO Inhale 2 puffs into the lungs in the morning and at bedtime. (Patient not taking: Reported on 09/18/2023) 1 each 12   doxycycline (VIBRAMYCIN) 100 MG capsule Take 1 capsule (100 mg total) by mouth 2 (two) times daily. 14 capsule 0   furosemide (LASIX) 40 MG tablet TAKE 1 TABLET BY MOUTH EVERY DAY (Patient not taking: Reported on 09/18/2023) 90 tablet 0   omeprazole (PRILOSEC) 40 MG capsule TAKE 1 CAPSULE BY MOUTH EVERY DAY (Patient not taking: Reported on 09/18/2023) 90 capsule 3   triamcinolone cream (KENALOG) 0.1 % Apply 1 Application topically 2 (two) times daily. (Patient not taking: Reported on 09/18/2023) 30 g 0   No facility-administered medications prior to visit.       Objective:   Physical Exam:  General appearance: 63 y.o., female, NAD, conversant  Eyes: anicteric sclerae; PERRL, tracking appropriately HENT: NCAT; MMM Neck: Trachea midline; no lymphadenopathy, no JVD Lungs: CTAB, no crackles, no wheeze, with normal respiratory effort CV: RRR, no murmur  Abdomen: Soft, non-tender; non-distended, BS present  Extremities: No peripheral edema, warm Skin: Normal turgor and texture; no rash Psych: Appropriate affect Neuro: Alert and oriented to person and place, no focal deficit     Vitals:   09/18/23 1326  BP: (!) 140/80  Pulse: 79  SpO2: 92%  Weight: (!) 357 lb 10.4 oz (162.2 kg)  Height: 5\' 4"  (1.626 m)      92% on RA BMI Readings  from Last 3 Encounters:  09/18/23 61.39 kg/m  08/09/23 60.08 kg/m  07/24/23 60.42 kg/m   Wt Readings from Last 3 Encounters:  09/18/23 (!) 357 lb 10.4 oz (162.2 kg)  08/09/23 (!) 350 lb (158.8 kg)  07/24/23 (!) 352 lb (159.7 kg)     CBC    Component Value Date/Time   WBC 6.6 07/24/2023 1529   WBC 7.5 11/15/2022 1613   RBC 4.21 07/24/2023 1529   RBC 4.32 11/15/2022 1613   HGB 13.3 07/24/2023 1529   HCT 40.9 07/24/2023 1529   PLT 162 07/24/2023 1529   MCV 97 07/24/2023 1529   MCH 31.6 07/24/2023 1529   MCH 30.4 09/20/2019 0231   MCHC 32.5 07/24/2023 1529   MCHC 33.5 11/15/2022 1613   RDW 13.4 07/24/2023 1529   LYMPHSABS 1.4 07/24/2023 1529   MONOABS 0.5 11/15/2022 1613   EOSABS 0.1 07/24/2023 1529   BASOSABS 0.0 07/24/2023 1529    Low level eosinophilia historically  Chest Imaging: CTA Chest 01/2019 reviewed by me and remarkable for 8mm nodule near minor fissure, no LAD, some likely dependent  atelectasis.   CXR 12/2019 reviewed by me and unremarkable  06/2019 NM perfusion: no perfusion defect  Pulmonary Functions Testing Results:    Latest Ref Rng & Units 01/16/2023   10:24 AM 04/01/2021   11:33 AM  PFT Results  FVC-Pre L 2.34  2.54   FVC-Predicted Pre % 70  72   FVC-Post L 2.27  2.50   FVC-Predicted Post % 68  71   Pre FEV1/FVC % % 87  86   Post FEV1/FCV % % 87  90   FEV1-Pre L 2.03  2.18   FEV1-Predicted Pre % 78  79   FEV1-Post L 1.98  2.25   DLCO uncorrected ml/min/mmHg 21.38  19.46   DLCO UNC% % 105  91   DLCO corrected ml/min/mmHg 21.79  20.40   DLCO COR %Predicted % 107  96   DLVA Predicted % 142  127   TLC L 3.87  4.53   TLC % Predicted % 76  87   RV % Predicted % 71  72     PFTs reviewed by me showing nonspecific ventilatory defect, equivocal results for air trapping and elevated DLCO for alveolar volume  Echocardiogram:   TTE 2021: mildly dilated LAD, LV hypertrophy, normal EF.  TTE 2014: G1DD  [facility] stress 2014 report essentially  normal       Assessment & Plan:   # Dyspnea on exertion: Overall improved with triple inhaled therapy via Breztri.  Still with exertional dyspnea.  Suspect related to poorly controlled asthma vs bronchomalacia particularly R main stem seen CT 2020  # Chronic cough: Suspect multifactorial related to postnasal drip possible GERD.  Large driver includes poorly controlled asthma.  Escalate therapies as below.  Encouraged adherence to inhalers as well as Flonase as needed.  #Severe persistent asthma with eosinophilia: Improved overall still with significant symptoms of cough and dyspnea on exertion.  Eosinophils mildly elevated. Started Nucala 06/19/23.  Plan: - Continue Nucala - Continue Breztri, cost has increased, will ask if related to deductible - albuterol 2 puffs as needed - flonase     Karren Burly, MD [facility] 09/18/2023 1:49 PM

## 2023-09-18 NOTE — Telephone Encounter (Signed)
 Patient states co-pay for [name] is increased significantly.  This is related to her deductible or is there a preferred ICS/LAMA/LABA? Thanks!

## 2023-09-18 NOTE — Patient Instructions (Signed)
 Nice to see you again  Continue the [employer] 2 puffs twice a day I will look for a more cost effective solution  I did provide a few samples, each sample will last 1 week.  Feel free to use these.  We reviewed-year-old CT scan from 2020, the right main area to be squished.  This can cause some shortness of breath and wheeze from time to time.  This will not get better with asthma treatment but I think we should be as aggressive as possible with asthma treatment to help your breathing.  Return to clinic in 3 months or sooner as needed

## 2023-09-19 ENCOUNTER — Other Ambulatory Visit (HOSPITAL_COMMUNITY): Payer: Self-pay

## 2023-09-19 ENCOUNTER — Telehealth: Payer: Self-pay

## 2023-09-19 NOTE — Telephone Encounter (Signed)
*  sent to Dr. Judeth Horn in original request for information  Per test claims the current co-pay of [name] is showing as $163.39- claim information shows that patient is in their Initial Benefit phase. (This is like a second deductible where the patient has another out of pocket amount to reach before insurance pays a larger portion of the drug)  Plan shows Trelegy as a "preferred" product however it is currently showing at $166.57

## 2023-09-19 NOTE — Telephone Encounter (Signed)
 Per test claims the current co-pay of [name] is showing as $163.39- claim information shows that patient is in their Initial Benefit phase. (This is like a second deductible where the patient has another out of pocket amount to reach before insurance pays a larger portion of the drug)  Plan shows Trelegy as a "preferred" product however it is currently showing at $166.57

## 2023-09-20 NOTE — Telephone Encounter (Signed)
 ATC pt- unable to leave due to mailbox being full. Will call back.

## 2023-09-20 NOTE — Telephone Encounter (Signed)
 Please let patient know it appears Breztri increase is due to deductible not totally met. Once met cost should go down. Trelegy  may be more cost effective once total deductible met.

## 2023-09-21 ENCOUNTER — Other Ambulatory Visit: Payer: Self-pay | Admitting: Family Medicine

## 2023-09-21 DIAGNOSIS — M7989 Other specified soft tissue disorders: Secondary | ICD-10-CM

## 2023-09-28 NOTE — Telephone Encounter (Signed)
 ATC X2. Unable to lvm due to being full. I will send a Mychart message. NFN

## 2023-10-05 ENCOUNTER — Other Ambulatory Visit: Payer: Self-pay | Admitting: Family Medicine

## 2023-10-05 DIAGNOSIS — I10 Essential (primary) hypertension: Secondary | ICD-10-CM

## 2023-10-13 ENCOUNTER — Other Ambulatory Visit: Payer: Self-pay | Admitting: Family Medicine

## 2023-10-13 DIAGNOSIS — F5101 Primary insomnia: Secondary | ICD-10-CM

## 2023-10-17 ENCOUNTER — Other Ambulatory Visit: Payer: Self-pay | Admitting: Family Medicine

## 2023-10-17 ENCOUNTER — Encounter: Payer: Self-pay | Admitting: Family Medicine

## 2023-10-17 DIAGNOSIS — F331 Major depressive disorder, recurrent, moderate: Secondary | ICD-10-CM

## 2023-10-18 ENCOUNTER — Other Ambulatory Visit: Payer: Self-pay | Admitting: Family Medicine

## 2023-10-18 DIAGNOSIS — E039 Hypothyroidism, unspecified: Secondary | ICD-10-CM

## 2023-10-23 ENCOUNTER — Encounter: Payer: Self-pay | Admitting: Family Medicine

## 2023-10-23 ENCOUNTER — Ambulatory Visit (INDEPENDENT_AMBULATORY_CARE_PROVIDER_SITE_OTHER): Payer: Medicare HMO | Admitting: Family Medicine

## 2023-10-23 VITALS — BP 116/69 | HR 89 | Temp 97.4°F | Ht 64.0 in | Wt 355.0 lb

## 2023-10-23 DIAGNOSIS — J8283 Eosinophilic asthma: Secondary | ICD-10-CM | POA: Diagnosis not present

## 2023-10-23 DIAGNOSIS — K219 Gastro-esophageal reflux disease without esophagitis: Secondary | ICD-10-CM

## 2023-10-23 DIAGNOSIS — E039 Hypothyroidism, unspecified: Secondary | ICD-10-CM

## 2023-10-23 DIAGNOSIS — E1169 Type 2 diabetes mellitus with other specified complication: Secondary | ICD-10-CM

## 2023-10-23 DIAGNOSIS — I10 Essential (primary) hypertension: Secondary | ICD-10-CM | POA: Diagnosis not present

## 2023-10-23 DIAGNOSIS — F331 Major depressive disorder, recurrent, moderate: Secondary | ICD-10-CM | POA: Diagnosis not present

## 2023-10-23 DIAGNOSIS — E7800 Pure hypercholesterolemia, unspecified: Secondary | ICD-10-CM

## 2023-10-23 DIAGNOSIS — F5101 Primary insomnia: Secondary | ICD-10-CM

## 2023-10-23 DIAGNOSIS — E119 Type 2 diabetes mellitus without complications: Secondary | ICD-10-CM | POA: Diagnosis not present

## 2023-10-23 DIAGNOSIS — J069 Acute upper respiratory infection, unspecified: Secondary | ICD-10-CM

## 2023-10-23 DIAGNOSIS — E756 Lipid storage disorder, unspecified: Secondary | ICD-10-CM | POA: Diagnosis not present

## 2023-10-23 DIAGNOSIS — M7989 Other specified soft tissue disorders: Secondary | ICD-10-CM | POA: Diagnosis not present

## 2023-10-23 DIAGNOSIS — R110 Nausea: Secondary | ICD-10-CM | POA: Diagnosis not present

## 2023-10-23 DIAGNOSIS — L409 Psoriasis, unspecified: Secondary | ICD-10-CM

## 2023-10-23 LAB — BAYER DCA HB A1C WAIVED: HB A1C (BAYER DCA - WAIVED): 6.9 % — ABNORMAL HIGH (ref 4.8–5.6)

## 2023-10-23 MED ORDER — OMEPRAZOLE 40 MG PO CPDR: DELAYED_RELEASE_CAPSULE | ORAL | 3 refills | Status: DC

## 2023-10-23 MED ORDER — FLUTICASONE PROPIONATE 50 MCG/ACT NA SUSP
2.0000 | Freq: Every day | NASAL | 6 refills | Status: DC
Start: 2023-10-23 — End: 2023-11-09

## 2023-10-23 MED ORDER — BREZTRI AEROSPHERE 160-9-4.8 MCG/ACT IN AERO
2.0000 | INHALATION_SPRAY | Freq: Two times a day (BID) | RESPIRATORY_TRACT | 12 refills | Status: AC
Start: 2023-10-23 — End: ?

## 2023-10-23 MED ORDER — SERTRALINE HCL 50 MG PO TABS: ORAL_TABLET | ORAL | 0 refills | Status: DC

## 2023-10-23 MED ORDER — TRAZODONE HCL 100 MG PO TABS: ORAL_TABLET | ORAL | 0 refills | Status: DC

## 2023-10-23 MED ORDER — MONTELUKAST SODIUM 10 MG PO TABS: ORAL_TABLET | ORAL | 1 refills | Status: DC

## 2023-10-23 MED ORDER — PROMETHAZINE HCL 25 MG PO TABS: 25.0000 mg | ORAL_TABLET | Freq: Four times a day (QID) | ORAL | 5 refills | Status: DC | PRN

## 2023-10-23 MED ORDER — LOSARTAN POTASSIUM 50 MG PO TABS: 50.0000 mg | ORAL_TABLET | Freq: Every day | ORAL | 0 refills | Status: DC

## 2023-10-23 MED ORDER — TRIAMCINOLONE ACETONIDE 0.1 % EX CREA
1.0000 | TOPICAL_CREAM | Freq: Two times a day (BID) | CUTANEOUS | 0 refills | Status: DC
Start: 2023-10-23 — End: 2023-10-23

## 2023-10-23 MED ORDER — FLUOCINOLONE ACETONIDE 0.01 % EX SOLN: CUTANEOUS | 2 refills | Status: DC

## 2023-10-23 MED ORDER — ROSUVASTATIN CALCIUM 40 MG PO TABS: 40.0000 mg | ORAL_TABLET | Freq: Every day | ORAL | 0 refills | Status: DC

## 2023-10-23 MED ORDER — BLOOD GLUCOSE TEST VI STRP: ORAL_STRIP | 3 refills | Status: AC

## 2023-10-23 MED ORDER — ALBUTEROL SULFATE HFA 108 (90 BASE) MCG/ACT IN AERS: 1.0000 | INHALATION_SPRAY | Freq: Four times a day (QID) | RESPIRATORY_TRACT | 11 refills | Status: AC | PRN

## 2023-10-23 MED ORDER — FUROSEMIDE 40 MG PO TABS: 40.0000 mg | ORAL_TABLET | Freq: Every day | ORAL | 0 refills | Status: DC

## 2023-10-23 MED ORDER — NABUMETONE 500 MG PO TABS: 1000.0000 mg | ORAL_TABLET | Freq: Two times a day (BID) | ORAL | 0 refills | Status: DC

## 2023-10-23 MED ORDER — PHENTERMINE HCL 37.5 MG PO CAPS: 37.5000 mg | ORAL_CAPSULE | ORAL | 2 refills | Status: DC

## 2023-10-23 MED ORDER — ONETOUCH DELICA PLUS LANCET33G MISC: 3 refills | Status: AC

## 2023-10-23 MED ORDER — METFORMIN HCL ER 500 MG PO TB24: 500.0000 mg | ORAL_TABLET | Freq: Every day | ORAL | 3 refills | Status: AC

## 2023-10-23 MED ORDER — LEVOTHYROXINE SODIUM 100 MCG PO TABS: ORAL_TABLET | ORAL | 0 refills | Status: DC

## 2023-10-23 NOTE — Patient Instructions (Signed)

## 2023-10-23 NOTE — Progress Notes (Signed)
 Subjective:  Patient ID: [name],  female    DOB: January 24, 1961  Age: 63 y.o.    CC: Medical Management of Chronic Issues (No concerns at this time. )   HPI [name] presents for  follow-up of hypertension. Patient has no history of headache chest pain or shortness of breath or recent cough. Patient also denies symptoms of TIA such as numbness weakness lateralizing. Patient denies side effects from medication. States taking it regularly.  Patient also  in for follow-up of elevated cholesterol. Doing well without complaints on current medication. Denies side effects  including myalgia and arthralgia and nausea. Also in today for liver function testing. Currently no chest pain, shortness of breath or other cardiovascular related symptoms noted.  Follow-up of diabetes. Patient does not check blood sugar at home. Patient denies symptoms such as excessive hunger or urinary frequency, excessive hunger, nausea No significant hypoglycemic spells noted. Medications reviewed. Pt reports taking them regularly. Pt. denies complication/adverse reaction today.    follow-up on  thyroid. The patient has a history of hypothyroidism for many years. It has been stable recently. Pt. denies any change in  voice, loss of hair, heat or cold intolerance. Energy level has been adequate to good. Patient denies constipation and diarrhea. No myxedema. Medication is as noted below. Verified that pt is taking it daily on an empty stomach. Well tolerated.  History [name] has a past medical history of Anemia, Aneurysmal bone cyst, other site (10/22/2013), Anxiety, ARF (acute renal failure) (HCC) (07/16/2019), Arthritis, Asthma, Colon polyps, Complex tear of medial meniscus of right knee as current injury (10/22/2013), Depression, Diabetes mellitus without complication (HCC), GERD (gastroesophageal reflux disease), Headache, High cholesterol, Hypertension, Lithium toxicity (06/2019), PONV (postoperative nausea and  vomiting), Primary insomnia (08/20/2019), Shortness of breath dyspnea, Sleep apnea, Syncope and collapse, and Total knee replacement status (04/16/2014).   She has a past surgical history that includes Endometrial ablation w/ novasure; Carpal tunnel release (07/25/2005); trigger finger release surgeyr ; Tubal ligation; Knee arthroscopy; Knee arthroscopy with medial menisectomy (Right, 10/22/2013); Back surgery (07/25/2010); Colonoscopy; Excision metacarpal mass (Right, 03/10/2014); Total knee arthroplasty (Left, 04/16/2014); Irrigation and debridement knee (Left, 05/23/2014); Total knee arthroplasty (Right, 12/23/2014); Excisional total knee arthroplasty with antibiotic spacers (Left, 09/19/2019); Joint replacement; and Spine surgery.   Her family history includes Alcohol abuse in her paternal grandfather; Arthritis in her mother; Breast cancer in her maternal grandmother; Cancer in her brother, daughter, and maternal grandmother; Gout in her father; Heart attack in her maternal grandfather; Heart disease in her mother; Heart disease (age of onset: 22) in her father; Hyperlipidemia in her father; Hypertension in her father and sister; Tuberculosis in her paternal grandfather.She reports that she has never smoked. She has never used smokeless tobacco. She reports that she does not drink alcohol and does not use drugs.  Current Outpatient Medications on File Prior to Visit  Medication Sig Dispense Refill   fluocinonide cream (LIDEX) 0.05 % Apply 1 Application topically 2 (two) times daily. 30 g 5   gabapentin (NEURONTIN) 300 MG capsule Take 1 capsule by mouth 2 (two) times daily.     Mepolizumab (NUCALA) 100 MG/ML SOAJ Inject 1 mL (100 mg total) into the skin every 28 (twenty-eight) days. Faxed w patient assistance application 1 mL 5   methocarbamol (ROBAXIN) 500 MG tablet Take 500 mg by mouth every 6 (six) hours as needed for muscle spasms.     VITAMIN D PO Take by mouth.     No  current  facility-administered medications on file prior to visit.    ROS Review of Systems  Constitutional: Negative.   HENT: Negative.    Eyes:  Negative for visual disturbance.  Respiratory:  Negative for shortness of breath.   Cardiovascular:  Negative for chest pain.  Gastrointestinal:  Negative for abdominal pain.  Musculoskeletal:  Negative for arthralgias.  All other systems reviewed and are negative.   Objective:  BP 116/69   Pulse 89   Temp (!) 97.4 F (36.3 C)   Ht 5\' 4"  (1.626 m)   Wt (!) 355 lb (161 kg)   BMI 60.94 kg/m   BP Readings from Last 3 Encounters:  10/23/23 116/69  09/18/23 (!) 140/80  08/09/23 128/79    Wt Readings from Last 3 Encounters:  10/23/23 (!) 355 lb (161 kg)  09/18/23 (!) 357 lb 10.4 oz (162.2 kg)  08/09/23 (!) 350 lb (158.8 kg)    Lab Results  Component Value Date   HGBA1C 6.0 (H) 07/24/2023   HGBA1C 5.9 (H) 04/24/2023   HGBA1C 6.6 (H) 01/18/2023    Physical Exam Constitutional:      General: She is not in acute distress.    Appearance: She is well-developed. She is obese.  Cardiovascular:     Rate and Rhythm: Normal rate and regular rhythm.  Pulmonary:     Breath sounds: Normal breath sounds.  Musculoskeletal:        General: Normal range of motion.     Right lower leg: Edema (2+) present.  Skin:    General: Skin is warm and dry.     Findings: Lesion (psoriatic plaque on back of left ear lobe and lesser scale in right EAC) present.  Neurological:     Mental Status: She is alert and oriented to person, place, and time.         Assessment & Plan:  Diabetic lipidosis (HCC) -     Bayer DCA Hb A1c Waived  Essential hypertension, benign -     CBC with Differential/Platelet -     CMP14+EGFR -     Losartan Potassium; Take 1 tablet (50 mg total) by mouth daily.  Dispense: 90 tablet; Refill: 0  Hypothyroidism, unspecified type -     TSH + free T4 -     Levothyroxine Sodium; TAKE 1 TABLET BY MOUTH EVERY DAY BEFORE BREAKFAST   Dispense: 90 tablet; Refill: 0  Diabetes mellitus, new onset (HCC) -     Bayer DCA Hb A1c Waived  High cholesterol -     Lipid panel  URI with cough and congestion -     Fluticasone Propionate; Place 2 sprays into both nostrils daily.  Dispense: 16 g; Refill: 6  Fluid level behind tympanic membrane of both ears -     Fluticasone Propionate; Place 2 sprays into both nostrils daily.  Dispense: 16 g; Refill: 6  Bilateral swelling of feet -     Furosemide; Take 1 tablet (40 mg total) by mouth daily.  Dispense: 90 tablet; Refill: 0  Eosinophilic asthma  Gastroesophageal reflux disease, unspecified whether esophagitis present -     Omeprazole; TAKE 1 CAPSULE BY MOUTH EVERY DAY  Dispense: 90 capsule; Refill: 3  Nausea -     Promethazine HCl; Take 1 tablet (25 mg total) by mouth every 6 (six) hours as needed for nausea or vomiting.  Dispense: 90 tablet; Refill: 5  Moderate episode of recurrent major depressive disorder (HCC) -     Sertraline HCl; TAKE 1  TABLET BY MOUTH EVERYDAY AT BEDTIME  Dispense: 90 tablet; Refill: 0  Primary insomnia -     traZODone HCl; TAKE 3 TABLETS (300MG ) BY MOUTH EVERY DAY AT BEDTIME  Dispense: 270 tablet; Refill: 0  Irritant contact dermatitis, unspecified trigger  Psoriasis  Other orders -     Phentermine HCl; Take 1 capsule (37.5 mg total) by mouth every morning.  Dispense: 30 capsule; Refill: 2 -     Albuterol Sulfate HFA; Inhale 1-2 puffs into the lungs every 6 (six) hours as needed for wheezing or shortness of breath.  Dispense: 1 each; Refill: 11 -     Breztri Aerosphere; Inhale 2 puffs into the lungs in the morning and at bedtime.  Dispense: 1 each; Refill: 12 -     Blood Glucose Test; Check BS in the morning, at noon and at bedtime Dx R73.03  Dispense: 300 strip; Refill: 3 -     OneTouch Delica Plus Lancet33G; Check BS in morning, at noon and at bedtime Dx R73.03  Dispense: 300 each; Refill: 3 -     metFORMIN HCl ER; Take 1 tablet (500 mg total) by  mouth daily with breakfast.  Dispense: 90 tablet; Refill: 3 -     Montelukast Sodium; TAKE 1 TABLET BY MOUTH EVERYDAY AT BEDTIME  Dispense: 90 tablet; Refill: 1 -     Nabumetone; Take 2 tablets (1,000 mg total) by mouth 2 (two) times daily. For muscle and joint pain  Dispense: 360 tablet; Refill: 0 -     Rosuvastatin Calcium; Take 1 tablet (40 mg total) by mouth daily.  Dispense: 90 tablet; Refill: 0 -     Fluocinolone Acetonide; Dribble 2-4 drops in affected ear 3 times a day until sx clear  Dispense: 60 mL; Refill: 2    Follow-up: Return in about 3 months (around 01/22/2024).  [name], M.D.

## 2023-10-24 ENCOUNTER — Encounter: Payer: Self-pay | Admitting: Family Medicine

## 2023-10-24 NOTE — Progress Notes (Signed)
 Hello Kynsli,  Your lab result is normal and/or stable.Some minor variations that are not significant are commonly marked abnormal, but do not represent any medical problem for you.  Best regards, [name], M.D.

## 2023-10-25 LAB — LIPID PANEL
Chol/HDL Ratio: 4.8 ratio — ABNORMAL HIGH (ref 0.0–4.4)
Cholesterol, Total: 144 mg/dL (ref 100–199)
HDL: 30 mg/dL — ABNORMAL LOW (ref >39)
LDL Chol Calc (NIH): 73 mg/dL (ref 0–99)
Triglycerides: 251 mg/dL — ABNORMAL HIGH (ref 0–149)
VLDL Cholesterol Cal: 41 mg/dL — ABNORMAL HIGH (ref 5–40)

## 2023-10-25 LAB — CBC WITH DIFFERENTIAL/PLATELET
Basophils Absolute: 0 10*3/uL (ref 0.0–0.2)
Basos: 0 %
EOS (ABSOLUTE): 0 10*3/uL (ref 0.0–0.4)
Eos: 1 %
Hematocrit: 43.9 % (ref 34.0–46.6)
Hemoglobin: 14.5 g/dL (ref 11.1–15.9)
Immature Grans (Abs): 0 10*3/uL (ref 0.0–0.1)
Immature Granulocytes: 1 %
Lymphocytes Absolute: 1.7 10*3/uL (ref 0.7–3.1)
Lymphs: 23 %
MCH: 32 pg (ref 26.6–33.0)
MCHC: 33 g/dL (ref 31.5–35.7)
MCV: 97 fL (ref 79–97)
Monocytes Absolute: 0.6 10*3/uL (ref 0.1–0.9)
Monocytes: 9 %
Neutrophils Absolute: 4.7 10*3/uL (ref 1.4–7.0)
Neutrophils: 66 %
Platelets: 192 10*3/uL (ref 150–450)
RBC: 4.53 x10E6/uL (ref 3.77–5.28)
RDW: 13.6 % (ref 11.7–15.4)
WBC: 7.1 10*3/uL (ref 3.4–10.8)

## 2023-10-25 LAB — CMP14+EGFR
ALT: 28 IU/L (ref 0–32)
AST: 27 IU/L (ref 0–40)
Albumin: 4.3 g/dL (ref 3.8–4.9)
Alkaline Phosphatase: 118 IU/L (ref 44–121)
BUN/Creatinine Ratio: 14 (ref 12–28)
BUN: 16 mg/dL (ref 8–27)
Bilirubin Total: 0.3 mg/dL (ref 0.0–1.2)
CO2: 21 mmol/L (ref 20–29)
Calcium: 9.4 mg/dL (ref 8.7–10.3)
Chloride: 103 mmol/L (ref 96–106)
Creatinine, Ser: 1.13 mg/dL — ABNORMAL HIGH (ref 0.57–1.00)
Globulin, Total: 2.8 g/dL (ref 1.5–4.5)
Glucose: 120 mg/dL — ABNORMAL HIGH (ref 70–99)
Potassium: 4.4 mmol/L (ref 3.5–5.2)
Sodium: 138 mmol/L (ref 134–144)
Total Protein: 7.1 g/dL (ref 6.0–8.5)
eGFR: 56 mL/min/{1.73_m2} — ABNORMAL LOW (ref >59)

## 2023-10-25 LAB — TSH+FREE T4
Free T4: 1.62 ng/dL (ref 0.82–1.77)
TSH: 1.62 u[IU]/mL (ref 0.450–4.500)

## 2023-11-09 ENCOUNTER — Other Ambulatory Visit: Payer: Self-pay

## 2023-11-09 ENCOUNTER — Emergency Department (HOSPITAL_COMMUNITY)

## 2023-11-09 ENCOUNTER — Observation Stay (HOSPITAL_COMMUNITY)
Admission: EM | Admit: 2023-11-09 | Discharge: 2023-11-11 | Disposition: A | Discharge status: Home / Self Care | Attending: Emergency Medicine | Admitting: Emergency Medicine

## 2023-11-09 DIAGNOSIS — E119 Type 2 diabetes mellitus without complications: Secondary | ICD-10-CM | POA: Insufficient documentation

## 2023-11-09 DIAGNOSIS — I471 Supraventricular tachycardia, unspecified: Secondary | ICD-10-CM | POA: Diagnosis not present

## 2023-11-09 DIAGNOSIS — J9811 Atelectasis: Secondary | ICD-10-CM | POA: Diagnosis not present

## 2023-11-09 DIAGNOSIS — Z7984 Long term (current) use of oral hypoglycemic drugs: Secondary | ICD-10-CM | POA: Insufficient documentation

## 2023-11-09 DIAGNOSIS — J4550 Severe persistent asthma, uncomplicated: Secondary | ICD-10-CM | POA: Insufficient documentation

## 2023-11-09 DIAGNOSIS — N179 Acute kidney failure, unspecified: Secondary | ICD-10-CM | POA: Diagnosis not present

## 2023-11-09 DIAGNOSIS — J9601 Acute respiratory failure with hypoxia: Secondary | ICD-10-CM | POA: Diagnosis not present

## 2023-11-09 DIAGNOSIS — Z96653 Presence of artificial knee joint, bilateral: Secondary | ICD-10-CM | POA: Diagnosis not present

## 2023-11-09 DIAGNOSIS — R939 Diagnostic imaging inconclusive due to excess body fat of patient: Secondary | ICD-10-CM | POA: Diagnosis not present

## 2023-11-09 DIAGNOSIS — R079 Chest pain, unspecified: Principal | ICD-10-CM | POA: Diagnosis present

## 2023-11-09 DIAGNOSIS — E66813 Obesity, class 3: Secondary | ICD-10-CM | POA: Insufficient documentation

## 2023-11-09 DIAGNOSIS — I10 Essential (primary) hypertension: Secondary | ICD-10-CM | POA: Diagnosis present

## 2023-11-09 DIAGNOSIS — Z6841 Body Mass Index (BMI) 40.0 and over, adult: Secondary | ICD-10-CM | POA: Diagnosis not present

## 2023-11-09 DIAGNOSIS — Z1152 Encounter for screening for COVID-19: Secondary | ICD-10-CM | POA: Diagnosis not present

## 2023-11-09 DIAGNOSIS — J9602 Acute respiratory failure with hypercapnia: Secondary | ICD-10-CM | POA: Insufficient documentation

## 2023-11-09 DIAGNOSIS — R0602 Shortness of breath: Secondary | ICD-10-CM | POA: Insufficient documentation

## 2023-11-09 DIAGNOSIS — Z794 Long term (current) use of insulin: Secondary | ICD-10-CM | POA: Insufficient documentation

## 2023-11-09 DIAGNOSIS — I517 Cardiomegaly: Secondary | ICD-10-CM | POA: Diagnosis not present

## 2023-11-09 DIAGNOSIS — Z79899 Other long term (current) drug therapy: Secondary | ICD-10-CM | POA: Insufficient documentation

## 2023-11-09 DIAGNOSIS — R7989 Other specified abnormal findings of blood chemistry: Secondary | ICD-10-CM

## 2023-11-09 DIAGNOSIS — E039 Hypothyroidism, unspecified: Secondary | ICD-10-CM | POA: Diagnosis not present

## 2023-11-09 DIAGNOSIS — E782 Mixed hyperlipidemia: Secondary | ICD-10-CM | POA: Diagnosis present

## 2023-11-09 LAB — CBC
HCT: 45.7 % (ref 36.0–46.0)
Hemoglobin: 14.8 g/dL (ref 12.0–15.0)
MCH: 31.6 pg (ref 26.0–34.0)
MCHC: 32.4 g/dL (ref 30.0–36.0)
MCV: 97.6 fL (ref 80.0–100.0)
Platelets: 258 10*3/uL (ref 150–400)
RBC: 4.68 MIL/uL (ref 3.87–5.11)
RDW: 14.2 % (ref 11.5–15.5)
WBC: 11.9 10*3/uL — ABNORMAL HIGH (ref 4.0–10.5)
nRBC: 0 % (ref 0.0–0.2)

## 2023-11-09 LAB — BLOOD GAS, VENOUS
Acid-Base Excess: 4.2 mmol/L — ABNORMAL HIGH (ref 0.0–2.0)
Bicarbonate: 30.3 mmol/L — ABNORMAL HIGH (ref 20.0–28.0)
Drawn by: 7012
O2 Saturation: 69.5 %
Patient temperature: 36.7
pCO2, Ven: 49 mmHg (ref 44–60)
pH, Ven: 7.39 (ref 7.25–7.43)
pO2, Ven: 38 mmHg (ref 32–45)

## 2023-11-09 LAB — BASIC METABOLIC PANEL WITH GFR
Anion gap: 16 — ABNORMAL HIGH (ref 5–15)
BUN: 20 mg/dL (ref 6–20)
CO2: 22 mmol/L (ref 22–32)
Calcium: 9.4 mg/dL (ref 8.9–10.3)
Chloride: 96 mmol/L — ABNORMAL LOW (ref 98–111)
Creatinine, Ser: 1.83 mg/dL — ABNORMAL HIGH (ref 0.44–1.00)
GFR, Estimated: 31 mL/min — ABNORMAL LOW (ref >60)
Glucose, Bld: 164 mg/dL — ABNORMAL HIGH (ref 70–99)
Potassium: 3.6 mmol/L (ref 3.5–5.1)
Sodium: 134 mmol/L — ABNORMAL LOW (ref 135–145)

## 2023-11-09 LAB — RESP PANEL BY RT-PCR (RSV, FLU A&B, COVID)  RVPGX2
Influenza A by PCR: NEGATIVE
Influenza B by PCR: NEGATIVE
Resp Syncytial Virus by PCR: NEGATIVE
SARS Coronavirus 2 by RT PCR: NEGATIVE

## 2023-11-09 LAB — TROPONIN I (HIGH SENSITIVITY)
Troponin I (High Sensitivity): 13 ng/L (ref <18)
Troponin I (High Sensitivity): 67 ng/L — ABNORMAL HIGH (ref <18)

## 2023-11-09 LAB — MAGNESIUM: Magnesium: 2 mg/dL (ref 1.7–2.4)

## 2023-11-09 LAB — TSH: TSH: 1.34 u[IU]/mL (ref 0.350–4.500)

## 2023-11-09 LAB — BRAIN NATRIURETIC PEPTIDE: B Natriuretic Peptide: 94 pg/mL (ref 0.0–100.0)

## 2023-11-09 MED ORDER — ADENOSINE 6 MG/2ML IV SOLN
12.0000 mg | Freq: Once | INTRAVENOUS | Status: AC
  Administered 2023-11-09: 12 mg via INTRAVENOUS

## 2023-11-09 MED ORDER — IPRATROPIUM-ALBUTEROL 0.5-2.5 (3) MG/3ML IN SOLN
3.0000 mL | Freq: Once | RESPIRATORY_TRACT | Status: AC
  Administered 2023-11-09: 3 mL via RESPIRATORY_TRACT
  Filled 2023-11-09: qty 3

## 2023-11-09 MED ORDER — IOHEXOL 350 MG/ML SOLN: 60.0000 mL | Freq: Once | INTRAVENOUS | Status: DC | PRN

## 2023-11-09 MED ORDER — LACTATED RINGERS IV BOLUS
1000.0000 mL | Freq: Once | INTRAVENOUS | Status: AC
  Administered 2023-11-09: 1000 mL via INTRAVENOUS

## 2023-11-09 MED ORDER — ADENOSINE 6 MG/2ML IV SOLN
6.0000 mg | Freq: Once | INTRAVENOUS | Status: DC
  Filled 2023-11-09: qty 2

## 2023-11-09 MED ORDER — IOHEXOL 350 MG/ML SOLN
80.0000 mL | Freq: Once | INTRAVENOUS | Status: AC | PRN
  Administered 2023-11-09: 75 mL via INTRAVENOUS

## 2023-11-09 NOTE — ED Provider Notes (Signed)
 [place] EMERGENCY DEPARTMENT AT [facility] Provider Note   CSN: [id number] Arrival date & time: 11/09/23  1926     History  Chief Complaint  Patient presents with   Chest Pain    [name] is a 63 y.o. female with PMH as listed below who presents c/o centralized chest pain that started around 1600 today. Endorses SOB. States it feels like an elephant is sitting on her chest. Pt HR 185 in triage. No h/o SVT or Afib. Over last few days felt "off," like "something wasn't right but couldn't put her finger on it." No f/c, cough, nausea/vomiting, urinary sxs. Always shas leg swelling and it isn't any worse than normal.  States she always feels short of breath 2.  Does have a history of asthma.  Has noticed some wheezing but has tried not to use her inhalers. No h/o CAD or HF. No h/o DVT/PE that she knows of. No recent travel, hospitalizations, surgeries. States she hasn't been eating/drinking very well over the last few days d/t not feeling well.    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   Anxiety    ARF (acute renal failure) (HCC) 07/16/2019   Arthritis    Asthma    Colon polyps    Complex tear of medial meniscus of right knee as current injury 10/22/2013   Depression    Diabetes mellitus without complication (HCC)    GERD (gastroesophageal reflux disease)    Headache    occasional headaches    High cholesterol    Hypertension    Lithium  toxicity 06/2019   has since resolved after discontinuing    PONV (postoperative nausea and vomiting)    Primary insomnia 08/20/2019   Shortness of breath dyspnea    due to weight gain    Sleep apnea    has not use CPAP machine in 2 yrs/ DOES NOT KNOW IF SHE NEEDS TO USE C - PAP   Syncope and collapse    due to severe knee pain    Total knee replacement status 04/16/2014       Home Medications Prior to Admission medications   Medication  Sig Start Date End Date Taking? Authorizing Provider  benzonatate  (TESSALON ) 200 MG capsule Take 200 mg by mouth 3 (three) times daily as needed. 11/01/23  Yes [provider]  albuterol  (VENTOLIN  HFA) 108 (90 Base) MCG/ACT inhaler Inhale 1-2 puffs into the lungs every 6 (six) hours as needed for wheezing or shortness of breath. 10/23/23   Roise Cleaver, MD  budeson-glycopyrrolate -formoterol  (BREZTRI  AEROSPHERE) 160-9-4.8 MCG/ACT AERO Inhale 2 puffs into the lungs in the morning and at bedtime. 10/23/23   Roise Cleaver, MD  fluocinolone  (SYNALAR ) 0.01 % external solution Dribble 2-4 drops in affected ear 3 times a day until sx clear 10/23/23   Roise Cleaver, MD  fluocinonide  cream (LIDEX ) 0.05 % Apply 1 Application topically 2 (two) times daily. 01/18/23   Roise Cleaver, MD  fluticasone  (FLONASE ) 50 MCG/ACT nasal spray Place 2 sprays into both nostrils daily. 10/23/23   Roise Cleaver, MD  furosemide  (LASIX ) 40 MG tablet Take 1 tablet (40 mg total) by mouth daily. 10/23/23   Roise Cleaver, MD  gabapentin  (NEURONTIN ) 300 MG capsule Take 1 capsule by mouth 2 (two) times daily. 01/06/22   [provider]  Glucose Blood (BLOOD GLUCOSE TEST STRIPS) STRP Check BS in the morning,  at noon and at bedtime Dx R73.03 10/23/23   Roise Cleaver, MD  Lancets [facility] Jewelene Morton PLUS [place]) MISC Check BS in morning, at noon and at bedtime Dx R73.03 10/23/23   Roise Cleaver, MD  levothyroxine  (SYNTHROID ) 100 MCG tablet TAKE 1 TABLET BY MOUTH EVERY DAY BEFORE BREAKFAST 10/23/23   Roise Cleaver, MD  losartan  (COZAAR ) 50 MG tablet Take 1 tablet (50 mg total) by mouth daily. 10/23/23   Roise Cleaver, MD  Mepolizumab  (NUCALA ) 100 MG/ML SOAJ Inject 1 mL (100 mg total) into the skin every 28 (twenty-eight) days. Faxed w patient assistance application 06/19/23   Hunsucker, Archer Kobs, MD  metFORMIN  (GLUCOPHAGE -XR) 500 MG 24 hr tablet Take 1 tablet (500 mg total) by mouth daily with breakfast. 10/23/23   Roise Cleaver, MD  methocarbamol  (ROBAXIN ) 500 MG tablet Take 500 mg by mouth every 6 (six) hours as needed for muscle spasms.    [provider]  metroNIDAZOLE  (METROGEL ) 0.75 % gel Apply 1 Application topically daily. 06/16/23   [provider]  montelukast  (SINGULAIR ) 10 MG tablet TAKE 1 TABLET BY MOUTH EVERYDAY AT BEDTIME 10/23/23   Roise Cleaver, MD  nabumetone  (RELAFEN ) 500 MG tablet Take 2 tablets (1,000 mg total) by mouth 2 (two) times daily. For muscle and joint pain 10/23/23   Roise Cleaver, MD  omeprazole  (PRILOSEC) 40 MG capsule TAKE 1 CAPSULE BY MOUTH EVERY DAY 10/23/23   Roise Cleaver, MD  phentermine  37.5 MG capsule Take 1 capsule (37.5 mg total) by mouth every morning. 10/23/23   Roise Cleaver, MD  promethazine  (PHENERGAN ) 25 MG tablet Take 1 tablet (25 mg total) by mouth every 6 (six) hours as needed for nausea or vomiting. 10/23/23   Roise Cleaver, MD  rosuvastatin  (CRESTOR ) 40 MG tablet Take 1 tablet (40 mg total) by mouth daily. 10/23/23   Roise Cleaver, MD  sertraline  (ZOLOFT ) 50 MG tablet TAKE 1 TABLET BY MOUTH EVERYDAY AT BEDTIME 10/23/23   Roise Cleaver, MD  traZODone  (DESYREL ) 100 MG tablet TAKE 3 TABLETS (300MG ) BY MOUTH EVERY DAY AT BEDTIME 10/23/23   Roise Cleaver, MD  triamcinolone  cream (KENALOG ) 0.1 % Apply 1 Application topically 2 (two) times daily. 10/23/23   [provider]  VITAMIN D PO Take by mouth.    [provider]      Allergies    Venlafaxine, Adhesive [tape], Ceclor [cefaclor], Chocolate, and Lisinopril     Review of Systems   Review of Systems A 10 point review of systems was performed and is negative unless otherwise reported in HPI.  Physical Exam Updated Vital Signs BP 117/68 (BP Location: Right Wrist)   Pulse (!) 107   Temp 98.3 F (36.8 C) (Oral)   Resp (!) 21   SpO2 94%  Physical Exam General: Ill- appearing obese female, lying in bed.  HEENT: PERRLA, Sclera anicteric, MMM, trachea midline.  Cardiology:  Regular tachycardic rate, no murmurs/rubs/gallops. BL radial and DP pulses equal bilaterally.  Resp: Satting 90% on 3 L nasal cannula, satting 94% on 5 L nasal cannula, mild tachypnea with very mild expiratory wheezing. No increased WOB.  Abd: Soft, non-tender, non-distended. No rebound tenderness or guarding.  GU: Deferred. MSK: No peripheral edema or signs of trauma. Extremities without deformity or TTP. No cyanosis or clubbing. Skin: warm, dry. Neuro: A&Ox4, CNs II-XII grossly intact. MAEs. Sensation grossly intact.  Psych: Normal mood and affect.   ED Results / Procedures / Treatments   Labs (all labs ordered are listed, but only abnormal  results are displayed) Labs Reviewed  BASIC METABOLIC PANEL WITH GFR  CBC  MAGNESIUM   URINALYSIS, ROUTINE W REFLEX MICROSCOPIC  POC URINE PREG, ED    EKG EKG Interpretation Date/Time:  Thursday November 09 2023 20:01:40 EDT Ventricular Rate:  106 PR Interval:  182 QRS Duration:  104 QT Interval:  343 QTC Calculation: 456 R Axis:   248  Text Interpretation: Sinus tachycardia Inferior infarct, old Confirmed by Annita Kindle [phone]) on 11/09/2023 11:31:41 PM  Radiology CXR: 1. Cardiomegaly. 2. Minimal strandy opacities in the lung bases favored is atelectasis.  CT PE: Pending  Procedures .Critical Care  Performed by: Merdis Stalling, MD Authorized by: Merdis Stalling, MD   Critical care provider statement:    Critical care time (minutes):  30   Critical care was necessary to treat or prevent imminent or life-threatening deterioration of the following conditions:  Cardiac failure and respiratory failure   Critical care was time spent personally by me on the following activities:  Development of treatment plan with patient or surrogate, evaluation of patient's response to treatment, examination of patient, ordering and review of laboratory studies, ordering and review of radiographic studies, ordering and performing treatments and  interventions, pulse oximetry, re-evaluation of patient's condition, review of old charts and obtaining history from patient or surrogate   Care discussed with: admitting provider   .Cardioversion  Date/Time: 11/15/2023 9:45 AM  Performed by: Merdis Stalling, MD Authorized by: Merdis Stalling, MD   Consent:    Consent obtained:  Verbal   Consent given by:  Patient   Risks discussed:  Death, induced arrhythmia and pain   Alternatives discussed:  No treatment Pre-procedure details:    Cardioversion basis:  Emergent   Rhythm:  Supraventricular tachycardia   Electrode placement:  Anterior-posterior Attempt one:    Cardioversion mode attempt one: chemical, 12 mg adenosine .   Shock outcome:  Conversion to normal sinus rhythm Post-procedure details:    Patient status:  Awake   Patient tolerance of procedure:  Tolerated well, no immediate complications     Medications Ordered in ED Medications  adenosine  (ADENOCARD ) 6 MG/2ML injection 12 mg (12 mg Intravenous Given 11/09/23 1958)    ED Course/ Medical Decision Making/ A&P                          Medical Decision Making Amount and/or Complexity of Data Reviewed Labs: ordered. Decision-making details documented in ED Course. Radiology: ordered. Decision-making details documented in ED Course.  Risk Prescription drug management. Decision regarding hospitalization.    This patient presents to the ED for concern of tachycardia, CP, SOB, this involves an extensive number of treatment options, and is a complaint that carries with it a high risk of complications and morbidity.  I considered the following differential and admission for this acute, potentially life threatening condition.   MDM:    Patient on arrival found to be in SVT.  Also hypoxic on room air into the 80s, placed on 3 L nasal cannula satting 90%.  She was hooked up to the pads telemetry immediately.  Attempted 2 rounds of vagal maneuvers unsuccessfully.  Patient also  desatted into the low 80s with the vagal maneuver so it was not attempted again.  Increased to 5 L nasal cannula, SpO2 improved to 94%. Consented patient for chemical cardioversion. Gave 12 mg IV adenosine  under telemetry with improvement to sinus tachycardia. Patient feels much improved.   For patient's hypoxia,  patient with history of asthma but no extra wheezing and good air movement bilaterally, lower concern for asthma exacerbation.  She presented with SVT, consider pulmonary embolism and will get CT PE.  Does have some atelectasis on the chest x-ray, could consider pneumonia however she has not had any productive cough or fever, but has had some malaise and decreased p.o. intake of the last couple of days.  Also consider viral syndrome, respiratory panel is negative.  With her chest pain, possibly caused by the SVT but also consider ACS will obtain a troponin.  She has no significant electrolyte derangements reassuringly and her BNP is negative, no pulmonary edema noted on the chest x-ray, low concern for heart failure.  No anemia.   Clinical Course as of 11/15/23 0944  Thu Nov 09, 2023  2206 Magnesium : 2.0 wnl [HN]  2206 B Natriuretic Peptide: 94.0 wnl [HN]  2206 DG Chest Port 1 View 1. Cardiomegaly. 2. Minimal strandy opacities in the lung bases favored is atelectasis.   [HN]  2207 Creatinine(!): 1.83 +AKI, likely prerenal with decreased PO intake history [HN]  2223 Resp panel by RT-PCR (RSV, Flu A&B, Covid) Anterior Nasal Swab neg [HN]    Clinical Course User Index [HN] Merdis Stalling, MD    Labs: I Ordered, and personally interpreted labs.  The pertinent results include: Those listed above  Imaging Studies ordered: I ordered imaging studies including CXR I independently visualized and interpreted imaging. I agree with the radiologist interpretation  Additional history obtained from chart review.    Cardiac Monitoring: The patient was maintained on a cardiac monitor.  I  personally viewed and interpreted the cardiac monitored which showed an underlying rhythm of: SVT, then ST, then NSR  Reevaluation: After the interventions noted above, I reevaluated the patient and found that they have :improved  Social Determinants of Health: Lives independently  Disposition:  Patient is signed out to the oncoming ED physician Dr. Lula Sale  who is made aware of her history, presentation, exam, workup, and plan. Plan is to obtain CT PE and likely admit patient for increased O2 requirement.   Co morbidities that complicate the patient evaluation  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   Anxiety    ARF (acute renal failure) (HCC) 07/16/2019   Arthritis    Asthma    Colon polyps    Complex tear of medial meniscus of right knee as current injury 10/22/2013   Depression    Diabetes mellitus without complication (HCC)    GERD (gastroesophageal reflux disease)    Headache    occasional headaches    High cholesterol    Hypertension    Lithium  toxicity 06/2019   has since resolved after discontinuing    PONV (postoperative nausea and vomiting)    Primary insomnia 08/20/2019   Shortness of breath dyspnea    due to weight gain    Sleep apnea    has not use CPAP machine in 2 yrs/ DOES NOT KNOW IF SHE NEEDS TO USE C - PAP   Syncope and collapse    due to severe knee pain    Total knee replacement status 04/16/2014     Medicines Meds ordered this encounter  Medications   DISCONTD: adenosine  (ADENOCARD ) 6 MG/2ML injection 6 mg   adenosine  (ADENOCARD ) 6 MG/2ML injection 12 mg    I have reviewed the patients home medicines and  have made adjustments as needed  Problem List / ED Course: Problem List Items Addressed This Visit   None Visit Diagnoses       SVT (supraventricular tachycardia) (HCC)    -  Primary   Relevant Medications   adenosine  (ADENOCARD ) 6 MG/2ML injection 12 mg  (Completed)                   This note was created using dictation software, which may contain spelling or grammatical errors.    Merdis Stalling, MD 11/15/23 [phone]

## 2023-11-09 NOTE — ED Triage Notes (Addendum)
 Pt c/o centralized chest pain that started around 1600 today. Endorses SOB. States it feels like an elephant is sitting on her chest. Pt HR 185 in triage.

## 2023-11-10 ENCOUNTER — Encounter (HOSPITAL_COMMUNITY): Payer: Self-pay | Admitting: Internal Medicine

## 2023-11-10 ENCOUNTER — Observation Stay (HOSPITAL_BASED_OUTPATIENT_CLINIC_OR_DEPARTMENT_OTHER)

## 2023-11-10 ENCOUNTER — Observation Stay (HOSPITAL_COMMUNITY)

## 2023-11-10 ENCOUNTER — Other Ambulatory Visit (HOSPITAL_COMMUNITY): Payer: Self-pay | Admitting: *Deleted

## 2023-11-10 DIAGNOSIS — R0789 Other chest pain: Secondary | ICD-10-CM | POA: Diagnosis not present

## 2023-11-10 DIAGNOSIS — Z6841 Body Mass Index (BMI) 40.0 and over, adult: Secondary | ICD-10-CM | POA: Diagnosis not present

## 2023-11-10 DIAGNOSIS — R079 Chest pain, unspecified: Secondary | ICD-10-CM | POA: Diagnosis present

## 2023-11-10 DIAGNOSIS — I471 Supraventricular tachycardia, unspecified: Principal | ICD-10-CM | POA: Insufficient documentation

## 2023-11-10 DIAGNOSIS — I517 Cardiomegaly: Secondary | ICD-10-CM

## 2023-11-10 DIAGNOSIS — I5A Non-ischemic myocardial injury (non-traumatic): Secondary | ICD-10-CM | POA: Diagnosis not present

## 2023-11-10 DIAGNOSIS — N179 Acute kidney failure, unspecified: Secondary | ICD-10-CM | POA: Diagnosis not present

## 2023-11-10 DIAGNOSIS — R7989 Other specified abnormal findings of blood chemistry: Secondary | ICD-10-CM | POA: Diagnosis not present

## 2023-11-10 DIAGNOSIS — I10 Essential (primary) hypertension: Secondary | ICD-10-CM | POA: Diagnosis not present

## 2023-11-10 DIAGNOSIS — E66813 Obesity, class 3: Secondary | ICD-10-CM | POA: Insufficient documentation

## 2023-11-10 DIAGNOSIS — J9602 Acute respiratory failure with hypercapnia: Secondary | ICD-10-CM

## 2023-11-10 DIAGNOSIS — E039 Hypothyroidism, unspecified: Secondary | ICD-10-CM | POA: Diagnosis not present

## 2023-11-10 DIAGNOSIS — Z1152 Encounter for screening for COVID-19: Secondary | ICD-10-CM | POA: Diagnosis not present

## 2023-11-10 DIAGNOSIS — J4550 Severe persistent asthma, uncomplicated: Secondary | ICD-10-CM | POA: Diagnosis not present

## 2023-11-10 DIAGNOSIS — J9601 Acute respiratory failure with hypoxia: Secondary | ICD-10-CM | POA: Diagnosis not present

## 2023-11-10 DIAGNOSIS — E119 Type 2 diabetes mellitus without complications: Secondary | ICD-10-CM | POA: Diagnosis not present

## 2023-11-10 HISTORY — DX: Other specified abnormal findings of blood chemistry: R79.89

## 2023-11-10 LAB — ECHOCARDIOGRAM COMPLETE
AR max vel: 2.74 cm2
AV Area VTI: 2.58 cm2
AV Area mean vel: 2.46 cm2
AV Mean grad: 3 mmHg
AV Peak grad: 5.6 mmHg
Ao pk vel: 1.18 m/s
Area-P 1/2: 4.71 cm2
Height: 64 in
MV VTI: 2.47 cm2
S' Lateral: 2.9 cm
Weight: 5619.08 [oz_av]

## 2023-11-10 LAB — TROPONIN I (HIGH SENSITIVITY)
Troponin I (High Sensitivity): 132 ng/L (ref <18)
Troponin I (High Sensitivity): 116 ng/L (ref <18)

## 2023-11-10 LAB — HEMOGLOBIN A1C
Hgb A1c MFr Bld: 6.6 % — ABNORMAL HIGH (ref 4.8–5.6)
Mean Plasma Glucose: 142.72 mg/dL

## 2023-11-10 LAB — RAPID URINE DRUG SCREEN, HOSP PERFORMED
Amphetamines: NOT DETECTED
Barbiturates: NOT DETECTED
Benzodiazepines: NOT DETECTED
Cocaine: NOT DETECTED
Opiates: NOT DETECTED
Tetrahydrocannabinol: NOT DETECTED

## 2023-11-10 LAB — MRSA NEXT GEN BY PCR, NASAL: MRSA by PCR Next Gen: NOT DETECTED

## 2023-11-10 LAB — GLUCOSE, CAPILLARY
Glucose-Capillary: 114 mg/dL — ABNORMAL HIGH (ref 70–99)
Glucose-Capillary: 88 mg/dL (ref 70–99)
Glucose-Capillary: 114 mg/dL — ABNORMAL HIGH (ref 70–99)

## 2023-11-10 LAB — URINALYSIS, ROUTINE W REFLEX MICROSCOPIC
Bilirubin Urine: NEGATIVE
Glucose, UA: NEGATIVE mg/dL
Hgb urine dipstick: NEGATIVE
Ketones, ur: NEGATIVE mg/dL
Leukocytes,Ua: NEGATIVE
Nitrite: NEGATIVE
Protein, ur: NEGATIVE mg/dL
Specific Gravity, Urine: 1.029 (ref 1.005–1.030)
pH: 5 (ref 5.0–8.0)

## 2023-11-10 LAB — PROCALCITONIN: Procalcitonin: <0.1 ng/mL

## 2023-11-10 LAB — BASIC METABOLIC PANEL WITH GFR
Anion gap: 9 (ref 5–15)
BUN: 17 mg/dL (ref 6–20)
CO2: 25 mmol/L (ref 22–32)
Calcium: 8.8 mg/dL — ABNORMAL LOW (ref 8.9–10.3)
Chloride: 102 mmol/L (ref 98–111)
Creatinine, Ser: 1.52 mg/dL — ABNORMAL HIGH (ref 0.44–1.00)
GFR, Estimated: 39 mL/min — ABNORMAL LOW (ref >60)
Glucose, Bld: 102 mg/dL — ABNORMAL HIGH (ref 70–99)
Potassium: 3.5 mmol/L (ref 3.5–5.1)
Sodium: 136 mmol/L (ref 135–145)

## 2023-11-10 LAB — MAGNESIUM: Magnesium: 2.1 mg/dL (ref 1.7–2.4)

## 2023-11-10 LAB — T4, FREE: Free T4: 1.21 ng/dL — ABNORMAL HIGH (ref 0.61–1.12)

## 2023-11-10 LAB — HIV ANTIBODY (ROUTINE TESTING W REFLEX): HIV Screen 4th Generation wRfx: NONREACTIVE

## 2023-11-10 MED ORDER — TRAZODONE HCL 50 MG PO TABS
100.0000 mg | ORAL_TABLET | Freq: Every day | ORAL | Status: DC
  Administered 2023-11-10: 100 mg via ORAL
  Filled 2023-11-10: qty 2

## 2023-11-10 MED ORDER — ROSUVASTATIN CALCIUM 20 MG PO TABS
40.0000 mg | ORAL_TABLET | Freq: Every day | ORAL | Status: DC
  Administered 2023-11-10 – 2023-11-11 (×2): 40 mg via ORAL
  Filled 2023-11-10 (×2): qty 2

## 2023-11-10 MED ORDER — METHOCARBAMOL 500 MG PO TABS
500.0000 mg | ORAL_TABLET | Freq: Four times a day (QID) | ORAL | Status: DC | PRN
  Administered 2023-11-10: 500 mg via ORAL
  Filled 2023-11-10: qty 1

## 2023-11-10 MED ORDER — ONDANSETRON HCL 4 MG/2ML IJ SOLN: 4.0000 mg | Freq: Four times a day (QID) | INTRAMUSCULAR | Status: DC | PRN

## 2023-11-10 MED ORDER — INSULIN ASPART 100 UNIT/ML IJ SOLN
0.0000 [IU] | Freq: Three times a day (TID) | INTRAMUSCULAR | Status: DC
  Administered 2023-11-11: 1 [IU] via SUBCUTANEOUS

## 2023-11-10 MED ORDER — LACTATED RINGERS IV SOLN: INTRAVENOUS | Status: AC

## 2023-11-10 MED ORDER — ONDANSETRON HCL 4 MG PO TABS: 4.0000 mg | ORAL_TABLET | Freq: Four times a day (QID) | ORAL | Status: DC | PRN

## 2023-11-10 MED ORDER — ENOXAPARIN SODIUM 40 MG/0.4ML IJ SOSY: 40.0000 mg | PREFILLED_SYRINGE | INTRAMUSCULAR | Status: DC

## 2023-11-10 MED ORDER — GABAPENTIN 300 MG PO CAPS
300.0000 mg | ORAL_CAPSULE | Freq: Two times a day (BID) | ORAL | Status: DC
Start: 2023-11-10 — End: 2023-11-11
  Administered 2023-11-10 – 2023-11-11 (×3): 300 mg via ORAL
  Filled 2023-11-10 (×3): qty 1

## 2023-11-10 MED ORDER — CHLORHEXIDINE GLUCONATE CLOTH 2 % EX PADS
6.0000 | MEDICATED_PAD | Freq: Every day | CUTANEOUS | Status: DC
Start: 2023-11-10 — End: 2023-11-11
  Administered 2023-11-10 – 2023-11-11 (×2): 6 via TOPICAL

## 2023-11-10 MED ORDER — LEVOTHYROXINE SODIUM 100 MCG PO TABS
100.0000 ug | ORAL_TABLET | Freq: Every day | ORAL | Status: DC
  Administered 2023-11-10 – 2023-11-11 (×2): 100 ug via ORAL
  Filled 2023-11-10 (×2): qty 1

## 2023-11-10 MED ORDER — ENOXAPARIN SODIUM 80 MG/0.8ML IJ SOSY
80.0000 mg | PREFILLED_SYRINGE | INTRAMUSCULAR | Status: DC
  Administered 2023-11-10 – 2023-11-11 (×2): 80 mg via SUBCUTANEOUS
  Filled 2023-11-10 (×2): qty 0.8

## 2023-11-10 MED ORDER — PERFLUTREN LIPID MICROSPHERE
1.0000 mL | INTRAVENOUS | Status: AC | PRN
  Administered 2023-11-10: 5 mL via INTRAVENOUS

## 2023-11-10 MED ORDER — BISOPROLOL FUMARATE 5 MG PO TABS
2.5000 mg | ORAL_TABLET | Freq: Every day | ORAL | Status: DC
  Administered 2023-11-10 – 2023-11-11 (×2): 2.5 mg via ORAL
  Filled 2023-11-10 (×2): qty 1

## 2023-11-10 MED ORDER — ACETAMINOPHEN 325 MG PO TABS: 650.0000 mg | ORAL_TABLET | Freq: Four times a day (QID) | ORAL | Status: DC | PRN

## 2023-11-10 MED ORDER — SERTRALINE HCL 50 MG PO TABS
50.0000 mg | ORAL_TABLET | Freq: Every day | ORAL | Status: DC
  Administered 2023-11-10: 50 mg via ORAL
  Filled 2023-11-10: qty 1

## 2023-11-10 MED ORDER — MONTELUKAST SODIUM 10 MG PO TABS
10.0000 mg | ORAL_TABLET | Freq: Every day | ORAL | Status: DC
  Administered 2023-11-10: 10 mg via ORAL
  Filled 2023-11-10: qty 1

## 2023-11-10 MED ORDER — ACETAMINOPHEN 650 MG RE SUPP: 650.0000 mg | Freq: Four times a day (QID) | RECTAL | Status: DC | PRN

## 2023-11-10 NOTE — Consult Note (Signed)
 Cardiology Consultation   Patient ID: [name] MRN: [id number]; DOB: [birth year]  Admit date: 11/09/2023 Date of Consult: 11/10/2023  PCP:  Roise Cleaver, MD   [facility] Providers Cardiologist: Previously Dr. Arlester Ladd in 2014 --> New to Dr. Arthea Larsson   Patient Profile:   [name] is a 63 y.o. female with a hx of HTN, HLD, Type 2 DM, hypothyroidism, asthma, anxiety and depression who is being seen 11/10/2023 for the evaluation of elevated troponin values and SVT at the request of Dr. Elyse Hand.  History of Present Illness:   [name] presented to [facility] on 11/09/2023 for evaluation of chest pain. Reported acute chest pain and shortness of breath which started that afternoon. Heart rate was in the 180's when checked in triage. She was found to be in SVT and vagal maneuvers were attempted without success. EKG showed a narrow-complex tachycardia, HR 177 with RAD. Received Adenosine  12 mg. Follow-up EKG showed sinus tachycardia, HR 106 with old inferior infarct pattern as noted on prior tracings.   Initial labs showed WBC 11.9, Hgb 14.8, platelets 258, Na+ 134, K+ 3.6 and creatinine 1.83 (previously 1.132 weeks prior).  TSH 1.340. BNP normal at 94. Negative for COVID, influenza and RSV. UDS negative. Initial and repeat Hs troponin values at 13, 67, 132 and 116. CXR showed cardiomegaly and atelectasis. CTA showed no evidence of a PE. No mention of coronary calcifications but was noted to have moderate cardiomegaly. Received a 1 L fluid bolus and has been on supplemental oxygen since admission.   In talking with the patient today, she reports having baseline dyspnea on exertion which has overall been unchanged. She is not active at baseline and mostly stays at home. She denies any chest pain with routine activities. Says her chest pressure yesterday developed abruptly after getting out of the shower. Started around 1600 and persisted until arrival to the ED and  administration of Adenosine . No recurrence since converting back to NSR. No orthopnea or PND. Does have intermittent lower extremity edema and takes Lasix  daily. Has known OSA but reports not tolerating CPAP in the past due to claustrophobia. She denies any alcohol  or tobacco use. Does consume 6+ cans of Pepsi soda on a daily basis.    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   Anxiety    ARF (acute renal failure) (HCC) 07/16/2019   Arthritis    Asthma    Colon polyps    Complex tear of medial meniscus of right knee as current injury 10/22/2013   Depression    Diabetes mellitus without complication (HCC)    GERD (gastroesophageal reflux disease)    Headache    occasional headaches    High cholesterol    Hypertension    Lithium  toxicity 06/2019   has since resolved after discontinuing    PONV (postoperative nausea and vomiting)    Primary insomnia 08/20/2019   Shortness of breath dyspnea    due to weight gain    Sleep apnea    has not use CPAP machine in 2 yrs/ DOES NOT KNOW IF SHE NEEDS TO USE C - PAP   Syncope and collapse    due to severe knee pain    Total knee replacement status 04/16/2014    Past Surgical History:  Procedure Laterality Date   BACK SURGERY  07/25/2010   lumb fusion  CARPAL TUNNEL RELEASE  07/25/2005   both hands   COLONOSCOPY     ENDOMETRIAL ABLATION W/ NOVASURE     EXCISION METACARPAL MASS Right 03/10/2014   Procedure: EXCISION MASS RIGHT HAND FIRST WEB SPACE DORSAL PALMAR INCISION ;  Surgeon: Lyanne Sample, MD;  Location: [facility];  Service: Orthopedics;  Laterality: Right;   EXCISIONAL TOTAL KNEE ARTHROPLASTY WITH ANTIBIOTIC SPACERS Left 09/19/2019   Procedure: EXCISIONAL  LEFT TOTAL KNEE ARTHROPLASTY WITH TOTAL KNEE REVISION;  Surgeon: Claiborne Crew, MD;  Location: [facility];  Service: Orthopedics;  Laterality: Left;  2 hrs   IRRIGATION AND DEBRIDEMENT KNEE  Left 05/23/2014   Procedure: IRRIGATION AND DEBRIDEMENT KNEE;  Surgeon: Bevin Bucks, MD;  Location: [facility];  Service: Orthopedics;  Laterality: Left;  With POLYETHYLENE EXCHANGE   JOINT REPLACEMENT     KNEE ARTHROSCOPY     let knee 03/2013    KNEE ARTHROSCOPY WITH MEDIAL MENISECTOMY Right 10/22/2013   Procedure: RIGHT KNEE ARTHROSCOPY WITH MEDIAL MENISECTOMY, abrasion chrondrplasty Everett Hitt OUT/CORRECT CURETTEMENT/BONE GRAFT/PROXIMAL TIBIA ;  Surgeon: Florencia Hunter, MD;  Location: [facility];  Service: Orthopedics;  Laterality: Right;   SPINE SURGERY     TOTAL KNEE ARTHROPLASTY Left 04/16/2014   Procedure: LEFT TOTAL KNEE ARTHROPLASTY;  Surgeon: Florencia Hunter, MD;  Location: [facility];  Service: Orthopedics;  Laterality: Left;   TOTAL KNEE ARTHROPLASTY Right 12/23/2014   Procedure: TOTAL KNEE ARTHROPLASTY;  Surgeon: Hazle Lites, MD;  Location: [facility];  Service: Orthopedics;  Laterality: Right;   trigger finger release surgeyr      bilateral    TUBAL LIGATION       Home Medications:  Prior to Admission medications   Medication Sig Start Date End Date Taking? Authorizing Provider  albuterol  (VENTOLIN  HFA) 108 (90 Base) MCG/ACT inhaler Inhale 1-2 puffs into the lungs every 6 (six) hours as needed for wheezing or shortness of breath. 10/23/23  Yes Roise Cleaver, MD  benzonatate  (TESSALON ) 200 MG capsule Take 200 mg by mouth 3 (three) times daily as needed for cough. 11/01/23  Yes [provider]  budeson-glycopyrrolate -formoterol  (BREZTRI  AEROSPHERE) 160-9-4.8 MCG/ACT AERO Inhale 2 puffs into the lungs in the morning and at bedtime. 10/23/23  Yes Roise Cleaver, MD  fluocinolone  (SYNALAR ) 0.01 % external solution Dribble 2-4 drops in affected ear 3 times a day until sx clear 10/23/23  Yes Stacks, Vallorie Gayer, MD  fluocinonide  cream (LIDEX ) 0.05 % Apply 1 Application topically 2 (two) times daily. 01/18/23  Yes Roise Cleaver, MD  furosemide  (LASIX ) 40 MG tablet Take 1 tablet (40 mg total) by  mouth daily. 10/23/23  Yes Roise Cleaver, MD  gabapentin  (NEURONTIN ) 300 MG capsule Take 1 capsule by mouth 2 (two) times daily. 01/06/22  Yes [provider]  levothyroxine  (SYNTHROID ) 100 MCG tablet TAKE 1 TABLET BY MOUTH EVERY DAY BEFORE BREAKFAST 10/23/23  Yes Stacks, Vallorie Gayer, MD  losartan  (COZAAR ) 50 MG tablet Take 1 tablet (50 mg total) by mouth daily. 10/23/23  Yes Roise Cleaver, MD  Mepolizumab  (NUCALA ) 100 MG/ML SOAJ Inject 1 mL (100 mg total) into the skin every 28 (twenty-eight) days. Faxed w patient assistance application 06/19/23  Yes Hunsucker, Archer Kobs, MD  metFORMIN  (GLUCOPHAGE -XR) 500 MG 24 hr tablet Take 1 tablet (500 mg total) by mouth daily with breakfast. 10/23/23  Yes Roise Cleaver, MD  methocarbamol  (ROBAXIN ) 500 MG tablet Take 500 mg by mouth every 6 (six) hours as needed for muscle spasms.   Yes [provider]  montelukast  (SINGULAIR ) 10 MG tablet TAKE 1 TABLET BY MOUTH EVERYDAY AT BEDTIME 10/23/23  Yes Stacks, Vallorie Gayer, MD  nabumetone  (RELAFEN ) 500 MG tablet Take 2 tablets (1,000 mg total) by mouth 2 (two) times daily. For muscle and joint pain 10/23/23  Yes Roise Cleaver, MD  phentermine  37.5 MG capsule Take 1 capsule (37.5 mg total) by mouth every morning. 10/23/23  Yes Roise Cleaver, MD  promethazine  (PHENERGAN ) 25 MG tablet Take 1 tablet (25 mg total) by mouth every 6 (six) hours as needed for nausea or vomiting. 10/23/23  Yes Roise Cleaver, MD  rosuvastatin  (CRESTOR ) 40 MG tablet Take 1 tablet (40 mg total) by mouth daily. 10/23/23  Yes Stacks, Vallorie Gayer, MD  sertraline  (ZOLOFT ) 50 MG tablet TAKE 1 TABLET BY MOUTH EVERYDAY AT BEDTIME 10/23/23  Yes Stacks, Vallorie Gayer, MD  traZODone  (DESYREL ) 100 MG tablet TAKE 3 TABLETS (300MG ) BY MOUTH EVERY DAY AT BEDTIME 10/23/23  Yes Stacks, Vallorie Gayer, MD  Glucose Blood (BLOOD GLUCOSE TEST STRIPS) STRP Check BS in the morning, at noon and at bedtime Dx R73.03 10/23/23   Roise Cleaver, MD  Lancets [facility] DELICA PLUS [place]) MISC  Check BS in morning, at noon and at bedtime Dx R73.03 10/23/23   Roise Cleaver, MD    Inpatient Medications: Scheduled Meds:  bisoprolol   2.5 mg Oral Daily   Chlorhexidine  Gluconate Cloth  6 each Topical Daily   insulin  aspart  0-9 Units Subcutaneous TID WC   Continuous Infusions:  PRN Meds:   Allergies:    Allergies  Allergen Reactions   Venlafaxine Other (See Comments)    Pt refuses to take "makes me feel like I am dying"   Adhesive [Tape] Other (See Comments)    Peeled off skin   Ceclor [Cefaclor] Other (See Comments)    Blisters on hands   Chocolate Other (See Comments)    Migraines   Lisinopril  Cough    Social History:   Social History   Socioeconomic History   Marital status: Married    Spouse name: Dee Farber   Number of children: 2   Years of education: 12   Highest education level: Some college, no degree  Occupational History    Comment: Disabled  Tobacco Use   Smoking status: Never   Smokeless tobacco: Never  Vaping Use   Vaping status: Never Used  Substance and Sexual Activity   Alcohol  use: No   Drug use: No   Sexual activity: Yes    Birth control/protection: Surgical    Comment: tubal & ablation  Other Topics Concern   Not on file  Social History Narrative   Lives with her husband who helps with grocery shopping and other transportation needs.     Has two children, Dorla Gartner and Mariah Shines and grandchildren - they all live nearby.   There are two outside dogs and several  Horses.     Family History:    Family History  Problem Relation Age of Onset   Alcohol  abuse Paternal Grandfather    Tuberculosis Paternal Grandfather    Breast cancer Maternal Grandmother    Cancer Maternal Grandmother        breast   Heart attack Maternal Grandfather    Hyperlipidemia Father    Hypertension Father    Heart disease Father 13       CABG   Gout Father    Arthritis Mother    Heart disease Mother    Cancer Brother        colon, lung, liver  Hypertension  Sister    Cancer Daughter        NEUROBLASTOMA     ROS:  Please see the history of present illness.   All other ROS reviewed and negative.     Physical Exam/Data:   Vitals:   11/10/23 0600 11/10/23 0700 11/10/23 0800 11/10/23 0853  BP: 129/61 131/68 (!) 151/78   Pulse: 80 81 79 81  Resp: 14 14    Temp:   97.9 F (36.6 C)   TempSrc:   Oral   SpO2: 96% 95% 98% 93%  Weight:      Height:        Intake/Output Summary (Last 24 hours) at 11/10/2023 0858 Last data filed at 11/10/2023 0700 Gross per 24 hour  Intake 1000 ml  Output 950 ml  Net 50 ml      11/10/2023    2:21 AM 10/23/2023    2:00 PM 09/18/2023    1:26 PM  Last 3 Weights  Weight (lbs) 351 lb 3.1 oz 355 lb 357 lb 10.4 oz  Weight (kg) 159.3 kg 161.027 kg 162.229 kg     Body mass index is 60.28 kg/m.  General: Pleasant, obese female currently in no acute distress.  HEENT: normal Neck: JVD difficult to assess due to body habitus.  Vascular: No carotid bruits; Distal pulses 2+ bilaterally Cardiac:  normal S1, S2; RRR; no murmur  Lungs:  clear to auscultation bilaterally, no wheezing, rhonchi or rales  Abd: soft, nontender, no hepatomegaly  Ext: no pitting edema Musculoskeletal:  No deformities, BUE and BLE strength normal and equal Skin: warm and dry  Neuro:  CNs 2-12 intact, no focal abnormalities noted Psych:  Normal affect   EKG:  The EKG was personally reviewed and demonstrates: NSR, HR 77 with 1st degree AV block. Computer generated QTc at 477 ms and manually calculated at 453 ms.   Telemetry:  Telemetry was personally reviewed and demonstrates:  NSR, HR in 70's to 80's with occasional PVC's.   Relevant CV Studies:  Echocardiogram: 09/2012 Study Conclusions   - Left ventricle: The cavity size was normal. Wall thickness    was increased in a pattern of mild LVH. Systolic function    was normal. The estimated ejection fraction was in the    range of 55% to 65%. Wall motion was normal; there were no     regional wall motion abnormalities. Doppler parameters are    consistent with abnormal left ventricular relaxation    (grade 1 diastolic dysfunction).  - Left atrium: The atrium was mildly dilated.   Laboratory Data:  High Sensitivity Troponin:   Recent Labs  Lab 11/09/23 2033 11/09/23 2234 11/10/23 0343 11/10/23 0606  TROPONINIHS 13 67* 132* 116*     Chemistry Recent Labs  Lab 11/09/23 2033 11/09/23 2034 11/10/23 0606  NA 134*  --  136  K 3.6  --  3.5  CL 96*  --  102  CO2 22  --  25  GLUCOSE 164*  --  102*  BUN 20  --  17  CREATININE 1.83*  --  1.52*  CALCIUM  9.4  --  8.8*  MG  --  2.0  --   GFRNONAA 31*  --  39*  ANIONGAP 16*  --  9    No results for input(s): "PROT", "ALBUMIN", "AST", "ALT", "ALKPHOS", "BILITOT" in the last 168 hours. Lipids No results for input(s): "CHOL", "TRIG", "HDL", "LABVLDL", "LDLCALC", "CHOLHDL" in the last 168 hours.  Hematology Recent Labs  Lab 11/09/23 2033  WBC 11.9*  RBC 4.68  HGB 14.8  HCT 45.7  MCV 97.6  MCH 31.6  MCHC 32.4  RDW 14.2  PLT 258   Thyroid   Recent Labs  Lab 11/09/23 0833  TSH 1.340    BNP Recent Labs  Lab 11/09/23 0833  BNP 94.0    DDimer No results for input(s): "DDIMER" in the last 168 hours.   Radiology/Studies:  CT Angio Chest PE W and/or Wo Contrast Result Date: 11/09/2023 CLINICAL DATA:  High probability for PE. Chest pain. Shortness of breath. EXAM: CT ANGIOGRAPHY CHEST WITH CONTRAST TECHNIQUE: Multidetector CT imaging of the chest was performed using the standard protocol during bolus administration of intravenous contrast. Multiplanar CT image reconstructions and MIPs were obtained to evaluate the vascular anatomy. RADIATION DOSE REDUCTION: This exam was performed according to the departmental dose-optimization program which includes automated exposure control, adjustment of the mA and/or kV according to patient size and/or use of iterative reconstruction technique. CONTRAST:  75mL OMNIPAQUE   IOHEXOL  350 MG/ML SOLN COMPARISON:  CT angiographic chest 02/08/2019. FINDINGS: Cardiovascular: Examination is mildly technically limited secondary to timing of the contrast bolus and body habitus. No pulmonary embolism identified. Aorta is normal in size. The heart is moderately enlarged. There is no pericardial effusion. Mediastinum/Nodes: No enlarged mediastinal, hilar, or axillary lymph nodes. Thyroid  gland, trachea, and esophagus demonstrate no significant findings. Lungs/Pleura: There is a stable 8 mm fissural nodule on the right image 6/75. The lungs are otherwise clear. There is no pleural effusion or pneumothorax Upper Abdomen: No acute abnormality. Musculoskeletal: No chest wall abnormality. No acute or significant osseous findings. Review of the MIP images confirms the above findings. IMPRESSION: 1. No evidence for pulmonary embolism. 2. Moderate cardiomegaly. 3. Stable 8 mm fissural nodule on the right. This is unchanged from 2020 and favored as benign. Electronically Signed   By: Tyron Gallon M.D.   On: 11/09/2023 23:31   DG Chest Port 1 View Result Date: 11/09/2023 CLINICAL DATA:  Chest pain EXAM: PORTABLE CHEST 1 VIEW COMPARISON:  Chest x-ray 11/15/2022 FINDINGS: The heart is enlarged. There are minimal strandy opacities in the lung bases favored is atelectasis. No definite pleural effusion or pneumothorax. No acute fractures are seen. IMPRESSION: 1. Cardiomegaly. 2. Minimal strandy opacities in the lung bases favored is atelectasis. Electronically Signed   By: Tyron Gallon M.D.   On: 11/09/2023 21:17     Assessment and Plan:   1. SVT - New diagnosis for the patient this admission and she quickly converted back to NSR with Adenosine . Episodes of SVT possibly triggered by dehydration (given AKI on admission), caffeine consumption or untreated OSA. Electrolytes and TSH WNL. CTA negative for a PE.   - Will obtain an echocardiogram to assess for any structural abnormalities. Will start  Bisoprolol  2.5mg  daily (favor this given her asthma). Reviewed the importance of reducing her caffeine intake. We also briefly discussed EP referral as an outpatient if she has recurrent events. Unsure if she would be an ablation candidate given her weight.   2. Elevated Troponin Values - Hs Troponin values have been elevated at 13, 67, 132 and 116 this admission. Suspect enzyme elevation is secondary to demand ischemia in the setting of SVT. She did have chest pressure while in SVT but symptoms resolved with return to NSR.  - Will obtain an echocardiogram to assess for any structural abnormalities. CTA did not mention coronary calcifications. If this is reassuring, would not anticipate further ischemic testing this  admission. She is not an ideal candidate for non-invasive testing given her body habitus.  3. Cardiomegaly - Noted on CTA. BNP normal at 94 but possibly inaccurate due to body habitus. Does not appear volume overloaded on exam. Echocardiogram pending to assess for any structural abnormalities.   4. AKI - Baseline creatinine 1.1 - 1.2. At 1.83 on admission and she received a 1L fluid bolus. Will recheck a BMET this morning. Hold PTA Lasix  for now.   5. HTN - BP overall well-controlled, at 131/68 this morning. She is on Losartan  50mg  daily prior to admission and may need to reduce to 25mg  daily pending BP trend with starting Bisoprolol .   6. Hypothyroidism - TSH at 1.340 this admission. On Levothyroxine  100 mcg daily prior to admission.  7. Persistent Asthma/OSA - Reports having known OSA but intolerant to her current CPAP. Encouraged her to review further with Pulmonology as an outpatient as untreated sleep apnea could be contributing to her arrhythmias.  - Currently has an O2 requirement. Undergoing further work-up per the admitting team.    For questions or updates, please contact [facility] Please consult [email or web address] for contact info under    Signed, Dorma Gash, PA-C  11/10/2023 8:58 AM

## 2023-11-10 NOTE — Progress Notes (Signed)
*  PRELIMINARY RESULTS* Echocardiogram 2D Echocardiogram has been performed.  Silvana Drones 11/10/2023, 3:09 PM

## 2023-11-10 NOTE — Care Management Obs Status (Signed)
 MEDICARE OBSERVATION STATUS NOTIFICATION   Patient Details  Name: [name] MRN: [id number] Date of Birth: [birth year]   Medicare Observation Status Notification Given:  Yes    Geraldina Klinefelter, RN 11/10/2023, 9:47 PM

## 2023-11-10 NOTE — Progress Notes (Signed)
   11/10/23 0541  Provider Notification  Provider Name/Title Elyse Hand, DO  Date Provider Notified 11/10/23  Time Provider Notified 0542  Method of Notification Page  Notification Reason Critical Result  Test performed and critical result Troponin 132  Date Critical Result Received 11/10/23  Time Critical Result Received 0541  Provider response See new orders  Date of Provider Response 11/10/23  Time of Provider Response 0544   Date and time results received: 11/10/23 0541  (use smartphrase ".now" to insert current time)  Test: Troponin  Critical Value: 132  Name of Provider Notified: Adefeso  Orders Received? Or Actions Taken?: See orders

## 2023-11-10 NOTE — Hospital Course (Addendum)
 63 year old female with a history of diabetes mellitus type 2, asthma, hypertension, hyperlipidemia, depression, OSA not on CPAP, and hypothyroidism presenting with chest pain and shortness of breath.  The patient states that she had substernal chest pain which she described as " an elephant sitting on my chest" starting around 1600 on 11/09/2023.  The patient had just gotten out of the shower and was picking something up from the floor when it developed.  She had not described any anginal type symptoms prior to this episode. However, the patient states that she has not been " feeling right" for at least 3 days.  When asked to describe this further, the patient states that she has been feeling more fatigued with some dyspnea on exertion over the past 3 months.  She has been having more fatigue with less activity.  She denies any fevers, chills, headache, neck pain, nausea, vomiting, diarrhea, abd pain, hematochezia, melena, dysuria, hematuria.  Notably, the patient was recently started on phentermine .  She stated that she took about 3 doses before stopping the medication.  Her last dose was on 11/03/2023.  She denies any other new medications.  She does note that her weight has increased from 270 pounds to 340 pounds over the last 8 months.  She has noted some increase in her lower extremity edema.  Smoked very little when she was in middle school. She works for [employer] in [place] division. 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 She has lived in [place] for a period. She has a cat.   In the ED, the patient was afebrile.  She was tachycardic in the 180s.  Oxygen saturation was in the 80s on room air.  She was placed on 5 L with saturation up to 94%.  EKG showed SVT.  The patient was given adenosine  12 mg IV with conversion to sinus tachycardia. BMP showed sodium 134, potassium 3.6, bicarbonate 22, serum creatinine 1.83.  WBC 11.9, hemoglobin 14.8, platelet 258.   UA was negative for pyuria.  UDS was negative.  BNP 94.  TSH 1.340. Troponins 13>> 67>> 132>> 116. CTA chest was negative for PE.  There was a right sided 8 mm nodule.  There is no pleural effusion.  Cardiology was consulted to assist with management.

## 2023-11-10 NOTE — H&P (Signed)
 History and Physical    Patient: [name]:[id number] DOB: 1960-09-03 DOA: 11/09/2023 DOS: the patient was seen and examined on 11/10/2023 PCP: Roise Cleaver, MD  Patient coming from: Home  Chief Complaint:  Chief Complaint  Patient presents with   Chest Pain   HPI: [name] is a 63 year old female with a history of diabetes mellitus type 2, asthma, hypertension, hyperlipidemia, depression, OSA not on CPAP, and hypothyroidism presenting with chest pain and shortness of breath.  The patient states that she had substernal chest pain which she described as " an elephant sitting on my chest" starting around 1600 on 11/09/2023.  The patient had just gotten out of the shower and was picking something up from the floor when it developed.  She had not described any anginal type symptoms prior to this episode. However, the patient states that she has not been " feeling right" for at least 3 days.  When asked to describe this further, the patient states that she has been feeling more fatigued with some dyspnea on exertion over the past 3 months.  She has been having more fatigue with less activity.  She denies any fevers, chills, headache, neck pain, nausea, vomiting, diarrhea, abd pain, hematochezia, melena, dysuria, hematuria.  Notably, the patient was recently started on phentermine .  She stated that she took about 3 doses before stopping the medication.  Her last dose was on 11/03/2023.  She denies any other new medications.  She does note that her weight has increased from 270 pounds to 340 pounds over the last 8 months.  She has noted some increase in her lower extremity edema.  In the ED, the patient was afebrile.  She was tachycardic in the 180s.  Oxygen saturation was in the 80s on room air.  She was placed on 5 L with saturation up to 94%.  EKG showed SVT.  The patient was given adenosine  12 mg IV with conversion to sinus tachycardia. BMP showed sodium 134, potassium 3.6, bicarbonate  22, serum creatinine 1.83.  WBC 11.9, hemoglobin 14.8, platelet 258.  UA was negative for pyuria.  UDS was negative.  BNP 94.  TSH 1.340. Troponins 13>> 67>> 132>> 116. CTA chest was negative for PE.  There was a right sided 8 mm nodule.  There is no pleural effusion.  Cardiology was consulted to assist with management.  Review of Systems: As mentioned in the history of present illness. All other systems reviewed and are negative. 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   Anxiety    ARF (acute renal failure) (HCC) 07/16/2019   Arthritis    Asthma    Colon polyps    Complex tear of medial meniscus of right knee as current injury 10/22/2013   Depression    Diabetes mellitus without complication (HCC)    GERD (gastroesophageal reflux disease)    Headache    occasional headaches    High cholesterol    Hypertension    Lithium  toxicity 06/2019   has since resolved after discontinuing    PONV (postoperative nausea and vomiting)    Primary insomnia 08/20/2019   Shortness of breath dyspnea    due to weight gain    Sleep apnea    has not use CPAP machine in 2 yrs/ DOES NOT KNOW IF SHE NEEDS TO USE C - PAP   Syncope and collapse  due to severe knee pain    Total knee replacement status 04/16/2014   Past Surgical History:  Procedure Laterality Date   BACK SURGERY  07/25/2010   lumb fusion   CARPAL TUNNEL RELEASE  07/25/2005   both hands   COLONOSCOPY     ENDOMETRIAL ABLATION W/ NOVASURE     EXCISION METACARPAL MASS Right 03/10/2014   Procedure: EXCISION MASS RIGHT HAND FIRST WEB SPACE DORSAL PALMAR INCISION ;  Surgeon: Lyanne Sample, MD;  Location: [facility];  Service: Orthopedics;  Laterality: Right;   EXCISIONAL TOTAL KNEE ARTHROPLASTY WITH ANTIBIOTIC SPACERS Left 09/19/2019   Procedure: EXCISIONAL  LEFT TOTAL KNEE ARTHROPLASTY WITH TOTAL KNEE REVISION;  Surgeon: Claiborne Crew, MD;   Location: [facility];  Service: Orthopedics;  Laterality: Left;  2 hrs   IRRIGATION AND DEBRIDEMENT KNEE Left 05/23/2014   Procedure: IRRIGATION AND DEBRIDEMENT KNEE;  Surgeon: Bevin Bucks, MD;  Location: [facility];  Service: Orthopedics;  Laterality: Left;  With POLYETHYLENE EXCHANGE   JOINT REPLACEMENT     KNEE ARTHROSCOPY     let knee 03/2013    KNEE ARTHROSCOPY WITH MEDIAL MENISECTOMY Right 10/22/2013   Procedure: RIGHT KNEE ARTHROSCOPY WITH MEDIAL MENISECTOMY, abrasion chrondrplasty Everett Hitt OUT/CORRECT CURETTEMENT/BONE GRAFT/PROXIMAL TIBIA ;  Surgeon: Florencia Hunter, MD;  Location: [facility];  Service: Orthopedics;  Laterality: Right;   SPINE SURGERY     TOTAL KNEE ARTHROPLASTY Left 04/16/2014   Procedure: LEFT TOTAL KNEE ARTHROPLASTY;  Surgeon: Florencia Hunter, MD;  Location: [facility];  Service: Orthopedics;  Laterality: Left;   TOTAL KNEE ARTHROPLASTY Right 12/23/2014   Procedure: TOTAL KNEE ARTHROPLASTY;  Surgeon: Hazle Lites, MD;  Location: [facility];  Service: Orthopedics;  Laterality: Right;   trigger finger release surgeyr      bilateral    TUBAL LIGATION     Social History:  reports that she has never smoked. She has never used smokeless tobacco. She reports that she does not drink alcohol  and does not use drugs.  Allergies  Allergen Reactions   Venlafaxine Other (See Comments)    Pt refuses to take "makes me feel like I am dying"   Adhesive [Tape] Other (See Comments)    Peeled off skin   Ceclor [Cefaclor] Other (See Comments)    Blisters on hands   Chocolate Other (See Comments)    Migraines   Lisinopril  Cough    Family History  Problem Relation Age of Onset   Alcohol  abuse Paternal Grandfather    Tuberculosis Paternal Grandfather    Breast cancer Maternal Grandmother    Cancer Maternal Grandmother        breast   Heart attack Maternal Grandfather    Hyperlipidemia Father    Hypertension Father    Heart disease Father 75       CABG   Gout Father    Arthritis  Mother    Heart disease Mother    Cancer Brother        colon, lung, liver   Hypertension Sister    Cancer Daughter        NEUROBLASTOMA    Prior to Admission medications   Medication Sig Start Date End Date Taking? Authorizing Provider  albuterol  (VENTOLIN  HFA) 108 (90 Base) MCG/ACT inhaler Inhale 1-2 puffs into the lungs every 6 (six) hours as needed for wheezing or shortness of breath. 10/23/23  Yes Roise Cleaver, MD  benzonatate  (TESSALON ) 200 MG capsule Take 200 mg by mouth 3 (three) times daily as needed  for cough. 11/01/23  Yes [provider]  budeson-glycopyrrolate -formoterol  (BREZTRI  AEROSPHERE) 160-9-4.8 MCG/ACT AERO Inhale 2 puffs into the lungs in the morning and at bedtime. 10/23/23  Yes Roise Cleaver, MD  fluocinolone  (SYNALAR ) 0.01 % external solution Dribble 2-4 drops in affected ear 3 times a day until sx clear 10/23/23  Yes Stacks, Vallorie Gayer, MD  fluocinonide  cream (LIDEX ) 0.05 % Apply 1 Application topically 2 (two) times daily. 01/18/23  Yes Roise Cleaver, MD  furosemide  (LASIX ) 40 MG tablet Take 1 tablet (40 mg total) by mouth daily. 10/23/23  Yes Roise Cleaver, MD  gabapentin  (NEURONTIN ) 300 MG capsule Take 1 capsule by mouth 2 (two) times daily. 01/06/22  Yes [provider]  levothyroxine  (SYNTHROID ) 100 MCG tablet TAKE 1 TABLET BY MOUTH EVERY DAY BEFORE BREAKFAST 10/23/23  Yes Stacks, Vallorie Gayer, MD  losartan  (COZAAR ) 50 MG tablet Take 1 tablet (50 mg total) by mouth daily. 10/23/23  Yes Roise Cleaver, MD  Mepolizumab  (NUCALA ) 100 MG/ML SOAJ Inject 1 mL (100 mg total) into the skin every 28 (twenty-eight) days. Faxed w patient assistance application 06/19/23  Yes Hunsucker, Archer Kobs, MD  metFORMIN  (GLUCOPHAGE -XR) 500 MG 24 hr tablet Take 1 tablet (500 mg total) by mouth daily with breakfast. 10/23/23  Yes Roise Cleaver, MD  methocarbamol  (ROBAXIN ) 500 MG tablet Take 500 mg by mouth every 6 (six) hours as needed for muscle spasms.   Yes [provider]   montelukast  (SINGULAIR ) 10 MG tablet TAKE 1 TABLET BY MOUTH EVERYDAY AT BEDTIME 10/23/23  Yes Stacks, Vallorie Gayer, MD  nabumetone  (RELAFEN ) 500 MG tablet Take 2 tablets (1,000 mg total) by mouth 2 (two) times daily. For muscle and joint pain 10/23/23  Yes Roise Cleaver, MD  phentermine  37.5 MG capsule Take 1 capsule (37.5 mg total) by mouth every morning. 10/23/23  Yes Roise Cleaver, MD  promethazine  (PHENERGAN ) 25 MG tablet Take 1 tablet (25 mg total) by mouth every 6 (six) hours as needed for nausea or vomiting. 10/23/23  Yes Roise Cleaver, MD  rosuvastatin  (CRESTOR ) 40 MG tablet Take 1 tablet (40 mg total) by mouth daily. 10/23/23  Yes Roise Cleaver, MD  sertraline  (ZOLOFT ) 50 MG tablet TAKE 1 TABLET BY MOUTH EVERYDAY AT BEDTIME 10/23/23  Yes Stacks, Vallorie Gayer, MD  traZODone  (DESYREL ) 100 MG tablet TAKE 3 TABLETS (300MG ) BY MOUTH EVERY DAY AT BEDTIME 10/23/23  Yes Roise Cleaver, MD  Glucose Blood (BLOOD GLUCOSE TEST STRIPS) STRP Check BS in the morning, at noon and at bedtime Dx R73.03 10/23/23   Roise Cleaver, MD  Lancets [facility] DELICA PLUS Uriah) MISC Check BS in morning, at noon and at bedtime Dx R73.03 10/23/23   Roise Cleaver, MD    Physical Exam: Vitals:   11/10/23 0500 11/10/23 0600 11/10/23 0700 11/10/23 0800  BP: 124/62 129/61 131/68   Pulse: 81 80 81   Resp: 13 14 14    Temp:    97.9 F (36.6 C)  TempSrc:    Oral  SpO2: 95% 96% 95%   Weight:      Height:       GENERAL:  A&O x 3, NAD, well developed, cooperative, follows commands HEENT: [place]/AT, No thrush, No icterus, No oral ulcers Neck:  No neck mass, No meningismus, soft, supple CV: RRR, no S3, no S4, no rub, no JVD Lungs: Fine bibasilar crackles but no wheezing.  Good air movement Abd: soft/NT +BS, nondistended Ext: 1 + LE edema, no lymphangitis, no cyanosis, no rashes Neuro:  CN II-XII intact, strength 4/5  in RUE, RLE, strength 4/5 LUE, LLE; sensation intact bilateral; no dysmetria; babinski equivocal  Data  Reviewed: Data reviewed above in history  Assessment and Plan: SVT - Given adenosine  12 mg IV x 1 with conversion to sinus rhythm - Plan to start BB - Cardiology consult - Echocardiogram - TSH 1.340  Chest pain/elevated troponin - Likely secondary to demand ischemia - Troponin 13>> 67>> 132>> 116 - Not consistent with ACS - Echocardiogram  Acute respiratory failure with hypoxia and hypercarbia - 11/09/2023 VBG 7.3 9/49/38/30 - Suspected patient has been chronically hypoxic at home - Likely related to atelectatic disease, restrictive lung disease/hypoventilation/OHS as well as asthma - Stable on 4 L - Wean oxygen as tolerated  Severe persistent asthma - Not on oxygen at home - No wheezing on exam - Patient follows with Dr. Marygrace Snellen - She takes Nucala  monthly, last dose 10/09/23 - continue singulair   AKI -baseline creatinine 1.0-1.2 -presented with serum creatinine 1.83 -hold nambutone, losartan , furosemide  -US  renal  Essential hypertension - Holding losartan  to allow BP margin if SVT returns  Diabetes mellitus type 2 - 10/23/2023 Hemoglobin A1c--6.9 - NovoLog  sliding scale - Holding metformin   Hypothyroidism - Continue Synthroid  - TSH 1.340  Morbid Obesity -BMI 60.28 -lifestyle modification     Advance Care Planning: FULL  Consults: cardiology  Family Communication: spouse 4/18  Severity of Illness: The appropriate patient status for this patient is OBSERVATION. Observation status is judged to be reasonable and necessary in order to provide the required intensity of service to ensure the patient's safety. The patient's presenting symptoms, physical exam findings, and initial radiographic and laboratory data in the context of their medical condition is felt to place them at decreased risk for further clinical deterioration. Furthermore, it is anticipated that the patient will be medically stable for discharge from the hospital within 2 midnights of admission.    Author: Demaris Fillers, MD 11/10/2023 8:26 AM  For on call review [email or web address].

## 2023-11-10 NOTE — Progress Notes (Signed)
   11/10/23 2213  TOC Brief Assessment  Insurance and Status Reviewed  Patient has primary care physician Yes  Home environment has been reviewed From home c/husband  Prior level of function: Independent  Prior/Current Home Services No current home services  Social Drivers of Health Review SDOH reviewed no interventions necessary  Readmission risk has been reviewed Yes  Transition of care needs no transition of care needs at this time   Transition of Care Department [facility]) has reviewed patient and no other TOC needs have been identified at this time. We will continue to monitor patient advancement through interdisciplinary progression rounds. If new patient needs arise, please place a TOC consult.

## 2023-11-10 NOTE — Progress Notes (Signed)
 Patient transferred from bed to chair with one person assist. Oxygen dropped to 86% on 1L, increased oxygen to 2L and patient came up to 93%.

## 2023-11-10 NOTE — ED Provider Notes (Signed)
  Physical Exam  BP (!) 160/84   Pulse 78   Temp 97.9 F (36.6 C) (Oral)   Resp 17   Ht 5\' 4"  (1.626 m)   Wt (!) 159.3 kg   SpO2 96%   BMI 60.28 kg/m   Physical Exam Vitals and nursing note reviewed.  Constitutional:      General: She is not in acute distress.    Appearance: She is well-developed. She is not diaphoretic.  HENT:     Head: Normocephalic and atraumatic.  Cardiovascular:     Rate and Rhythm: Normal rate and regular rhythm.     Heart sounds: No murmur heard.    No friction rub. No gallop.  Pulmonary:     Effort: Pulmonary effort is normal. No respiratory distress.     Breath sounds: Normal breath sounds. No wheezing.  Abdominal:     General: Bowel sounds are normal. There is no distension.     Palpations: Abdomen is soft.     Tenderness: There is no abdominal tenderness.  Musculoskeletal:        General: Normal range of motion.     Cervical back: Normal range of motion and neck supple.  Skin:    General: Skin is warm and dry.  Neurological:     General: No focal deficit present.     Mental Status: She is alert and oriented to person, place, and time.     Procedures  Procedures  ED Course / MDM   Clinical Course as of 11/10/23 0238  Thu Nov 09, 2023  2206 Magnesium : 2.0 wnl [HN]  2206 B Natriuretic Peptide: 94.0 wnl [HN]  2206 DG Chest Port 1 View 1. Cardiomegaly. 2. Minimal strandy opacities in the lung bases favored is atelectasis.   [HN]  2207 Creatinine(!): 1.83 +AKI, likely prerenal with decreased PO intake history [HN]  2223 Resp panel by RT-PCR (RSV, Flu A&B, Covid) Anterior Nasal Swab neg [HN]    Clinical Course User Index [HN] Merdis Stalling, MD   Medical Decision Making Amount and/or Complexity of Data Reviewed Labs: ordered. Decision-making details documented in ED Course. Radiology: ordered. Decision-making details documented in ED Course.  Risk Prescription drug management. Decision regarding hospitalization.   Care  assumed from Dr. Drury Geralds at shift change.  Patient awaiting results of CTA of the chest.  Patient presenting here with SVT and was converted with adenosine .  She complained of significant chest discomfort during this episode.  Initial troponin was normal, then increased to 67 upon repeat.  She was displaying some hypoxia with oxygen requirement so a CT scan of the chest was obtained.  This has resulted and shows no evidence for pulmonary embolism or other explanation for her hypoxia.  Given the small bump in her troponin and the reported chest pain she was experiencing during this episode, the concern for ischemia was raised.  I feel as though patient should be admitted for overnight observation and possible cardiology consult in the morning.  She is currently pain-free and is feeling better.       Orvilla Blander, MD 11/10/23 [phone]

## 2023-11-10 NOTE — Plan of Care (Signed)

## 2023-11-11 DIAGNOSIS — R0789 Other chest pain: Secondary | ICD-10-CM

## 2023-11-11 DIAGNOSIS — R7989 Other specified abnormal findings of blood chemistry: Secondary | ICD-10-CM | POA: Diagnosis not present

## 2023-11-11 DIAGNOSIS — J4520 Mild intermittent asthma, uncomplicated: Secondary | ICD-10-CM | POA: Diagnosis not present

## 2023-11-11 DIAGNOSIS — I471 Supraventricular tachycardia, unspecified: Secondary | ICD-10-CM | POA: Diagnosis not present

## 2023-11-11 DIAGNOSIS — N179 Acute kidney failure, unspecified: Secondary | ICD-10-CM | POA: Diagnosis not present

## 2023-11-11 DIAGNOSIS — R079 Chest pain, unspecified: Secondary | ICD-10-CM | POA: Diagnosis not present

## 2023-11-11 DIAGNOSIS — J9602 Acute respiratory failure with hypercapnia: Secondary | ICD-10-CM | POA: Diagnosis not present

## 2023-11-11 DIAGNOSIS — R0602 Shortness of breath: Secondary | ICD-10-CM | POA: Diagnosis not present

## 2023-11-11 DIAGNOSIS — J9601 Acute respiratory failure with hypoxia: Secondary | ICD-10-CM | POA: Diagnosis not present

## 2023-11-11 LAB — COMPREHENSIVE METABOLIC PANEL WITH GFR
ALT: 24 U/L (ref 0–44)
AST: 25 U/L (ref 15–41)
Albumin: 3.4 g/dL — ABNORMAL LOW (ref 3.5–5.0)
Alkaline Phosphatase: 82 U/L (ref 38–126)
Anion gap: 8 (ref 5–15)
BUN: 15 mg/dL (ref 6–20)
CO2: 26 mmol/L (ref 22–32)
Calcium: 8.9 mg/dL (ref 8.9–10.3)
Chloride: 103 mmol/L (ref 98–111)
Creatinine, Ser: 1.33 mg/dL — ABNORMAL HIGH (ref 0.44–1.00)
GFR, Estimated: 46 mL/min — ABNORMAL LOW (ref >60)
Glucose, Bld: 108 mg/dL — ABNORMAL HIGH (ref 70–99)
Potassium: 3.7 mmol/L (ref 3.5–5.1)
Sodium: 137 mmol/L (ref 135–145)
Total Bilirubin: 0.8 mg/dL (ref 0.0–1.2)
Total Protein: 6.7 g/dL (ref 6.5–8.1)

## 2023-11-11 LAB — GLUCOSE, CAPILLARY
Glucose-Capillary: 103 mg/dL — ABNORMAL HIGH (ref 70–99)
Glucose-Capillary: 141 mg/dL — ABNORMAL HIGH (ref 70–99)

## 2023-11-11 MED ORDER — BISOPROLOL FUMARATE 5 MG PO TABS: 2.5000 mg | ORAL_TABLET | Freq: Every day | ORAL | 1 refills | Status: DC

## 2023-11-11 NOTE — Discharge Summary (Signed)
 Physician Discharge Summary   Patient: [name] MRN: [id number] DOB: 1961-07-11  Admit date:     11/09/2023  Discharge date: 11/11/23  Discharge Physician: Myrtie Atkinson Lurae Hornbrook   PCP: Roise Cleaver, MD   Recommendations at discharge:   Please follow up with primary care provider within 1-2 weeks  Please repeat BMP and CBC in one week Please check ambulatory saturation study in 2 wks    Hospital Course: 63 year old female with a history of diabetes mellitus type 2, asthma, hypertension, hyperlipidemia, depression, OSA not on CPAP, and hypothyroidism presenting with chest pain and shortness of breath.  The patient states that she had substernal chest pain which she described as " an elephant sitting on my chest" starting around 1600 on 11/09/2023.  The patient had just gotten out of the shower and was picking something up from the floor when it developed.  She had not described any anginal type symptoms prior to this episode. However, the patient states that she has not been " feeling right" for at least 3 days.  When asked to describe this further, the patient states that she has been feeling more fatigued with some dyspnea on exertion over the past 3 months.  She has been having more fatigue with less activity.  She denies any fevers, chills, headache, neck pain, nausea, vomiting, diarrhea, abd pain, hematochezia, melena, dysuria, hematuria.  Notably, the patient was recently started on phentermine .  She stated that she took about 3 doses before stopping the medication.  Her last dose was on 11/03/2023.  She denies any other new medications.  She does note that her weight has increased from 270 pounds to 340 pounds over the last 8 months.  She has noted some increase in her lower extremity edema.  Smoked very little when she was in middle school. She works for [employer] in [place] division. 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 She has lived in [place]  for a period. She has a cat.   In the ED, the patient was afebrile.  She was tachycardic in the 180s.  Oxygen saturation was in the 80s on room air.  She was placed on 5 L with saturation up to 94%.  EKG showed SVT.  The patient was given adenosine  12 mg IV with conversion to sinus tachycardia. BMP showed sodium 134, potassium 3.6, bicarbonate 22, serum creatinine 1.83.  WBC 11.9, hemoglobin 14.8, platelet 258.  UA was negative for pyuria.  UDS was negative.  BNP 94.  TSH 1.340. Troponins 13>> 67>> 132>> 116. CTA chest was negative for PE.  There was a right sided 8 mm nodule.  There is no pleural effusion.  Cardiology was consulted to assist with management.  Assessment and Plan: SVT - Given adenosine  12 mg IV x 1 with conversion to sinus rhythm - started on bisoprolol  - remained in sinus for rest of hospitalization - Cardiology consult appreciated>>>If she has recurrent SVT despite being on rate controlling agents, she will need EP referral for SVT ablation  - 11/10/23 Echo--EF 60-65%, no WMA, G1DD, normal RVF - TSH 1.340   Chest pain/elevated troponin - Likely secondary to demand ischemia - Troponin 13>> 67>> 132>> 116 - Not consistent with ACS - 11/10/23 Echo--EF 60-65%, no WMA, G1DD, normal RVF   Acute respiratory failure with hypoxia and hypercarbia - 11/09/2023 VBG 7.3 9/49/38/30 - Suspected patient has been chronically hypoxic at home - Likely related to atelectatic disease, restrictive lung disease/hypoventilation/OHS as  well as asthma - Stable on 4 L>>weaned to 2L - d/c home with 2L [place] -She has an upcoming sleep study to update her sleep apnea results    Severe persistent asthma - Not on oxygen at home prior to admission - No wheezing on exam - Patient follows with Dr. Marygrace Snellen - She takes Nucala  monthly, last dose 10/09/23 - continue singulair    AKI -baseline creatinine 1.0-1.2 -presented with serum creatinine 1.83 -given IVF during hospitalization -hold nambutone,  losartan , furosemide  -US  renal--no hydronephrosis -serum creatinine 1.33 on day of dc -will not restart losartan  or nambutone   Essential hypertension - Holding losartan  to allow BP margin if SVT returns - started on biosprolol - BP well controlled on bisoprolol , will not restart losartan    Diabetes mellitus type 2 - 10/23/2023 Hemoglobin A1c--6.9 - 11/10/23 A1C = 6.6 - NovoLog  sliding scale - Holding metformin >>restart   Hypothyroidism - Continue Synthroid  - TSH 1.340   Morbid Obesity -BMI 60.28 -lifestyle modification         Consultants: cardiology Procedures performed: none  Disposition: Home Diet recommendation:  Cardiac and Carb modified diet DISCHARGE MEDICATION: Allergies as of 11/11/2023       Reactions   Venlafaxine Other (See Comments)   Pt refuses to take "makes me feel like I am dying"   Adhesive [tape] Other (See Comments)   Peeled off skin   Ceclor [cefaclor] Other (See Comments)   Blisters on hands   Chocolate Other (See Comments)   Migraines   Lisinopril  Cough        Medication List     STOP taking these medications    losartan  50 MG tablet Commonly known as: COZAAR    nabumetone  500 MG tablet Commonly known as: RELAFEN    phentermine  37.5 MG capsule       TAKE these medications    albuterol  108 (90 Base) MCG/ACT inhaler Commonly known as: VENTOLIN  HFA Inhale 1-2 puffs into the lungs every 6 (six) hours as needed for wheezing or shortness of breath.   benzonatate  200 MG capsule Commonly known as: TESSALON  Take 200 mg by mouth 3 (three) times daily as needed for cough.   bisoprolol  5 MG tablet Commonly known as: ZEBETA  Take 0.5 tablets (2.5 mg total) by mouth daily. Start taking on: November 12, 2023   BLOOD GLUCOSE TEST STRIPS Strp Check BS in the morning, at noon and at bedtime Dx R73.03   Breztri  Aerosphere 160-9-4.8 MCG/ACT Aero inhaler Generic drug: budeson-glycopyrrolate -formoterol  Inhale 2 puffs into the lungs in  the morning and at bedtime.   fluocinolone  0.01 % external solution Commonly known as: Synalar  Dribble 2-4 drops in affected ear 3 times a day until sx clear   fluocinonide  cream 0.05 % Commonly known as: LIDEX  Apply 1 Application topically 2 (two) times daily.   furosemide  40 MG tablet Commonly known as: LASIX  Take 1 tablet (40 mg total) by mouth daily.   gabapentin  300 MG capsule Commonly known as: NEURONTIN  Take 1 capsule by mouth 2 (two) times daily.   levothyroxine  100 MCG tablet Commonly known as: SYNTHROID  TAKE 1 TABLET BY MOUTH EVERY DAY BEFORE BREAKFAST   metFORMIN  500 MG 24 hr tablet Commonly known as: GLUCOPHAGE -XR Take 1 tablet (500 mg total) by mouth daily with breakfast.   methocarbamol  500 MG tablet Commonly known as: ROBAXIN  Take 500 mg by mouth every 6 (six) hours as needed for muscle spasms.   montelukast  10 MG tablet Commonly known as: SINGULAIR  TAKE 1 TABLET BY MOUTH EVERYDAY AT  BEDTIME   Nucala  100 MG/ML Soaj Generic drug: Mepolizumab  Inject 1 mL (100 mg total) into the skin every 28 (twenty-eight) days. Faxed w patient assistance application   OneTouch Delica Plus Lancet33G Misc Check BS in morning, at noon and at bedtime Dx R73.03   promethazine  25 MG tablet Commonly known as: PHENERGAN  Take 1 tablet (25 mg total) by mouth every 6 (six) hours as needed for nausea or vomiting.   rosuvastatin  40 MG tablet Commonly known as: CRESTOR  Take 1 tablet (40 mg total) by mouth daily.   sertraline  50 MG tablet Commonly known as: ZOLOFT  TAKE 1 TABLET BY MOUTH EVERYDAY AT BEDTIME   traZODone  100 MG tablet Commonly known as: DESYREL  TAKE 3 TABLETS (300MG ) BY MOUTH EVERY DAY AT BEDTIME               Durable Medical Equipment  (From admission, onward)           Start     Ordered   11/11/23 1217  For home use only DME oxygen  Once       Comments: POC for Asthma  Question Answer Comment  Length of Need 6 Months   Mode or (Route) Nasal  cannula   Liters per Minute 2   Frequency Continuous (stationary and portable oxygen unit needed)   Oxygen conserving device Yes   Oxygen delivery system Gas      11/11/23 1216            Follow-up Information     Dorma Gash, PA-C Follow up on 12/07/2023.   Specialties: Cardiology, Cardiology Why: Cardiology Hospital Follow-up on 12/07/2023 at 3:30 PM. Contact information: [address] [place] [address] [phone]                Discharge Exam: Cleavon Curls Weights   11/10/23 0221  Weight: (!) 159.3 kg   HEENT:  [place]/AT, No thrush, no icterus CV:  RRR, no rub, no S3, no S4 Lung:  bibasilar rales.  No wheeze Abd:  soft/+BS, NT Ext:  No edema, no lymphangitis, no synovitis, no rash   Condition at discharge: stable  The results of significant diagnostics from this hospitalization (including imaging, microbiology, ancillary and laboratory) are listed below for reference.   Imaging Studies: ECHOCARDIOGRAM COMPLETE Result Date: 11/10/2023    ECHOCARDIOGRAM REPORT   Patient Name:   JANISE GORA Date of Exam: 11/10/2023 Medical Rec #:  [id number]      Height:       64.0 in Accession #:    [id number]     Weight:       351.2 lb Date of Birth:  May 05, 1961      BSA:          2.485 m Patient Age:    62 years       BP:           148/45 mmHg Patient Gender: F              HR:           69 bpm. Exam Location:  [facility] Procedure: 2D Echo, Cardiac Doppler, Color Doppler and Intracardiac            Opacification Agent (Both Spectral and Color Flow Doppler were            utilized during procedure). Indications:    Elevated Troponin  History:        Patient has prior history of Echocardiogram examinations, most  recent 10/11/2012. Signs/Symptoms:Chest Pain and Fatigue.                 Elevated Troponin.  Sonographer:    Clarke Crouch Referring Phys: [id number] Dorma Gash  Sonographer Comments: Technically difficult study due to poor echo windows, suboptimal  parasternal window, suboptimal apical window, suboptimal subcostal window and patient is obese. IMPRESSIONS  1. No evidence of LV thrombus by Definity . Left ventricular ejection fraction, by estimation, is 60 to 65%. The left ventricle has normal function. The left ventricle has no regional wall motion abnormalities. There is mild left ventricular hypertrophy.  Left ventricular diastolic parameters are consistent with Grade I diastolic dysfunction (impaired relaxation).  2. Right ventricular systolic function is normal. The right ventricular size is normal. Tricuspid regurgitation signal is inadequate for assessing PA pressure.  3. The mitral valve is grossly normal. No evidence of mitral valve regurgitation. No evidence of mitral stenosis.  4. The aortic valve was not well visualized. Aortic valve regurgitation is not visualized. No aortic stenosis is present.  5. The inferior vena cava is dilated in size with >50% respiratory variability, suggesting right atrial pressure of 8 mmHg. Comparison(s): No prior Echocardiogram. FINDINGS  Left Ventricle: No evidence of LV thrombus by Definity . Left ventricular ejection fraction, by estimation, is 60 to 65%. The left ventricle has normal function. The left ventricle has no regional wall motion abnormalities. Definity  contrast agent was given IV to delineate the left ventricular endocardial borders. Strain was performed and the global longitudinal strain is indeterminate. The left ventricular internal cavity size was normal in size. There is mild left ventricular hypertrophy. Left ventricular diastolic parameters are consistent with Grade I diastolic dysfunction (impaired relaxation). Normal left ventricular filling pressure. Right Ventricle: The right ventricular size is normal. No increase in right ventricular wall thickness. Right ventricular systolic function is normal. Tricuspid regurgitation signal is inadequate for assessing PA pressure. Left Atrium: Left atrial size  was normal in size. Right Atrium: Right atrial size was normal in size. Pericardium: There is no evidence of pericardial effusion. Mitral Valve: The mitral valve is grossly normal. No evidence of mitral valve regurgitation. No evidence of mitral valve stenosis. MV peak gradient, 3.7 mmHg. The mean mitral valve gradient is 2.0 mmHg. Tricuspid Valve: The tricuspid valve is not well visualized. Tricuspid valve regurgitation is not demonstrated. No evidence of tricuspid stenosis. Aortic Valve: The aortic valve was not well visualized. Aortic valve regurgitation is not visualized. No aortic stenosis is present. Aortic valve mean gradient measures 3.0 mmHg. Aortic valve peak gradient measures 5.6 mmHg. Aortic valve area, by VTI measures 2.58 cm. Pulmonic Valve: The pulmonic valve was not well visualized. Pulmonic valve regurgitation is not visualized. No evidence of pulmonic stenosis. Aorta: The aortic root is normal in size and structure. Venous: The inferior vena cava is dilated in size with greater than 50% respiratory variability, suggesting right atrial pressure of 8 mmHg. IAS/Shunts: No atrial level shunt detected by color flow Doppler. Additional Comments: 3D was performed not requiring image post processing on an independent workstation and was indeterminate.  LEFT VENTRICLE PLAX 2D LVIDd:         5.00 cm   Diastology LVIDs:         2.90 cm   LV e' lateral:   5.77 cm/s LV PW:         1.40 cm   LV E/e' lateral: 12.3 LV IVS:        1.20 cm LVOT diam:  2.10 cm LV SV:         70 LV SV Index:   28 LVOT Area:     3.46 cm  LEFT ATRIUM         Index LA diam:    4.20 cm 1.69 cm/m  AORTIC VALVE                    PULMONIC VALVE AV Area (Vmax):    2.74 cm     PV Vmax:       0.81 m/s AV Area (Vmean):   2.46 cm     PV Peak grad:  2.6 mmHg AV Area (VTI):     2.58 cm AV Vmax:           118.00 cm/s AV Vmean:          78.900 cm/s AV VTI:            0.272 m AV Peak Grad:      5.6 mmHg AV Mean Grad:      3.0 mmHg LVOT Vmax:          93.30 cm/s LVOT Vmean:        56.100 cm/s LVOT VTI:          0.203 m LVOT/AV VTI ratio: 0.75  AORTA Ao Root diam: 3.60 cm MITRAL VALVE MV Area (PHT): 4.71 cm    SHUNTS MV Area VTI:   2.47 cm    Systemic VTI:  0.20 m MV Peak grad:  3.7 mmHg    Systemic Diam: 2.10 cm MV Mean grad:  2.0 mmHg MV Vmax:       0.97 m/s MV Vmean:      65.6 cm/s MV Decel Time: 161 msec MV E velocity: 71.10 cm/s MV A velocity: 88.30 cm/s MV E/A ratio:  0.81 Vishnu Priya Mallipeddi Electronically signed by Lucetta Russel Mallipeddi Signature Date/Time: 11/10/2023/4:11:54 PM    Final    US  RENAL Result Date: 11/10/2023 CLINICAL DATA:  Acute kidney injury EXAM: RENAL / URINARY TRACT ULTRASOUND COMPLETE COMPARISON:  None Available. FINDINGS: Right Kidney: Length = 13.0 cm Normal parenchymal echogenicity with preserved corticomedullary differentiation. No urinary tract dilation or shadowing calculi. The ureter is not seen. Left Kidney: Length = 13.4 cm Normal parenchymal echogenicity with preserved corticomedullary differentiation. No urinary tract dilation or shadowing calculi. The ureter is not seen. Bladder: Appears normal for degree of bladder distention. Other: Diffusely increased hepatic echogenicity can be seen in the setting of infiltrative process such as steatosis. IMPRESSION: 1. No urinary tract dilation or shadowing calculi. 2. Diffusely increased hepatic echogenicity can be seen in the setting of infiltrative process such as steatosis. Electronically Signed   By: Limin  Xu M.D.   On: 11/10/2023 14:21   CT Angio Chest PE W and/or Wo Contrast Result Date: 11/09/2023 CLINICAL DATA:  High probability for PE. Chest pain. Shortness of breath. EXAM: CT ANGIOGRAPHY CHEST WITH CONTRAST TECHNIQUE: Multidetector CT imaging of the chest was performed using the standard protocol during bolus administration of intravenous contrast. Multiplanar CT image reconstructions and MIPs were obtained to evaluate the vascular anatomy. RADIATION  DOSE REDUCTION: This exam was performed according to the departmental dose-optimization program which includes automated exposure control, adjustment of the mA and/or kV according to patient size and/or use of iterative reconstruction technique. CONTRAST:  75mL OMNIPAQUE  IOHEXOL  350 MG/ML SOLN COMPARISON:  CT angiographic chest 02/08/2019. FINDINGS: Cardiovascular: Examination is mildly technically limited secondary to timing of the contrast bolus and body  habitus. No pulmonary embolism identified. Aorta is normal in size. The heart is moderately enlarged. There is no pericardial effusion. Mediastinum/Nodes: No enlarged mediastinal, hilar, or axillary lymph nodes. Thyroid  gland, trachea, and esophagus demonstrate no significant findings. Lungs/Pleura: There is a stable 8 mm fissural nodule on the right image 6/75. The lungs are otherwise clear. There is no pleural effusion or pneumothorax Upper Abdomen: No acute abnormality. Musculoskeletal: No chest wall abnormality. No acute or significant osseous findings. Review of the MIP images confirms the above findings. IMPRESSION: 1. No evidence for pulmonary embolism. 2. Moderate cardiomegaly. 3. Stable 8 mm fissural nodule on the right. This is unchanged from 2020 and favored as benign. Electronically Signed   By: Tyron Gallon M.D.   On: 11/09/2023 23:31   DG Chest Port 1 View Result Date: 11/09/2023 CLINICAL DATA:  Chest pain EXAM: PORTABLE CHEST 1 VIEW COMPARISON:  Chest x-ray 11/15/2022 FINDINGS: The heart is enlarged. There are minimal strandy opacities in the lung bases favored is atelectasis. No definite pleural effusion or pneumothorax. No acute fractures are seen. IMPRESSION: 1. Cardiomegaly. 2. Minimal strandy opacities in the lung bases favored is atelectasis. Electronically Signed   By: Tyron Gallon M.D.   On: 11/09/2023 21:17    Microbiology: Results for orders placed or performed during the hospital encounter of 11/09/23  Resp panel by RT-PCR (RSV,  Flu A&B, Covid) Anterior Nasal Swab     Status: None   Collection Time: 11/09/23  9:06 PM   Specimen: Anterior Nasal Swab  Result Value Ref Range Status   SARS Coronavirus 2 by RT PCR NEGATIVE NEGATIVE Final    Comment: (NOTE) SARS-CoV-2 target nucleic acids are NOT DETECTED.  The SARS-CoV-2 RNA is generally detectable in upper respiratory specimens during the acute phase of infection. The lowest concentration of SARS-CoV-2 viral copies this assay can detect is 138 copies/mL. A negative result does not preclude SARS-Cov-2 infection and should not be used as the sole basis for treatment or other patient management decisions. A negative result may occur with  improper specimen collection/handling, submission of specimen other than nasopharyngeal swab, presence of viral mutation(s) within the areas targeted by this assay, and inadequate number of viral copies(<138 copies/mL). A negative result must be combined with clinical observations, patient history, and epidemiological information. The expected result is Negative.  Fact Sheet for Patients:  BloggerCourse.com  Fact Sheet for Healthcare Providers:  SeriousBroker.it  This test is no t yet approved or cleared by the [place]  FDA and  has been authorized for detection and/or diagnosis of SARS-CoV-2 by FDA under an Emergency Use Authorization (EUA). This EUA will remain  in effect (meaning this test can be used) for the duration of the COVID-19 declaration under Section 564(b)(1) of the Act, 21 U.S.C.section 360bbb-3(b)(1), unless the authorization is terminated  or revoked sooner.       Influenza A by PCR NEGATIVE NEGATIVE Final   Influenza B by PCR NEGATIVE NEGATIVE Final    Comment: (NOTE) The Xpert Xpress SARS-CoV-2/FLU/RSV plus assay is intended as an aid in the diagnosis of influenza from Nasopharyngeal swab specimens and should not be used as a sole basis for  treatment. Nasal washings and aspirates are unacceptable for Xpert Xpress SARS-CoV-2/FLU/RSV testing.  Fact Sheet for Patients: BloggerCourse.com  Fact Sheet for Healthcare Providers: SeriousBroker.it  This test is not yet approved or cleared by the [place]  FDA and has been authorized for detection and/or diagnosis of SARS-CoV-2 by FDA under an Emergency Use Authorization (  EUA). This EUA will remain in effect (meaning this test can be used) for the duration of the COVID-19 declaration under Section 564(b)(1) of the Act, 21 U.S.C. section 360bbb-3(b)(1), unless the authorization is terminated or revoked.     Resp Syncytial Virus by PCR NEGATIVE NEGATIVE Final    Comment: (NOTE) Fact Sheet for Patients: BloggerCourse.com  Fact Sheet for Healthcare Providers: SeriousBroker.it  This test is not yet approved or cleared by the [place]  FDA and has been authorized for detection and/or diagnosis of SARS-CoV-2 by FDA under an Emergency Use Authorization (EUA). This EUA will remain in effect (meaning this test can be used) for the duration of the COVID-19 declaration under Section 564(b)(1) of the Act, 21 U.S.C. section 360bbb-3(b)(1), unless the authorization is terminated or revoked.  Performed at [facility], [address], [place], [place] [address]   MRSA Next Gen by PCR, Nasal     Status: None   Collection Time: 11/10/23  2:16 AM   Specimen: Nasal Mucosa; Nasal Swab  Result Value Ref Range Status   MRSA by PCR Next Gen NOT DETECTED NOT DETECTED Final    Comment: (NOTE) The GeneXpert MRSA Assay (FDA approved for NASAL specimens only), is one component of a comprehensive MRSA colonization surveillance program. It is not intended to diagnose MRSA infection nor to guide or monitor treatment for MRSA infections. Test performance is not FDA approved in patients less than  48 years old. Performed at [facility], [address] [place], [place] [address]     Labs: CBC: Recent Labs  Lab 11/09/23 2033  WBC 11.9*  HGB 14.8  HCT 45.7  MCV 97.6  PLT 258   Basic Metabolic Panel: Recent Labs  Lab 11/09/23 2033 11/09/23 2034 11/10/23 0606 11/10/23 0942 11/11/23 0359  NA 134*  --  136  --  137  K 3.6  --  3.5  --  3.7  CL 96*  --  102  --  103  CO2 22  --  25  --  26  GLUCOSE 164*  --  102*  --  108*  BUN 20  --  17  --  15  CREATININE 1.83*  --  1.52*  --  1.33*  CALCIUM  9.4  --  8.8*  --  8.9  MG  --  2.0  --  2.1  --    Liver Function Tests: Recent Labs  Lab 11/11/23 0359  AST 25  ALT 24  ALKPHOS 82  BILITOT 0.8  PROT 6.7  ALBUMIN 3.4*   CBG: Recent Labs  Lab 11/10/23 1127 11/10/23 1615 11/10/23 2143 11/11/23 0735 11/11/23 1107  GLUCAP 114* 88 114* 103* 141*    Discharge time spent: greater than 30 minutes.  Signed: Demaris Fillers, MD [facility] 11/11/2023

## 2023-11-11 NOTE — Plan of Care (Signed)

## 2023-11-11 NOTE — Progress Notes (Signed)
 Nsg Discharge Note  Admit Date:  11/09/2023 Discharge date: 11/11/2023   [name] to be D/C'd Home per MD order.  AVS completed.  Patient/caregiver able to verbalize understanding.  Discharge Medication: Allergies as of 11/11/2023       Reactions   Venlafaxine Other (See Comments)   Pt refuses to take "makes me feel like I am dying"   Adhesive [tape] Other (See Comments)   Peeled off skin   Ceclor [cefaclor] Other (See Comments)   Blisters on hands   Chocolate Other (See Comments)   Migraines   Lisinopril  Cough        Medication List     STOP taking these medications    losartan  50 MG tablet Commonly known as: COZAAR    nabumetone  500 MG tablet Commonly known as: RELAFEN    phentermine  37.5 MG capsule       TAKE these medications    albuterol  108 (90 Base) MCG/ACT inhaler Commonly known as: VENTOLIN  HFA Inhale 1-2 puffs into the lungs every 6 (six) hours as needed for wheezing or shortness of breath.   benzonatate  200 MG capsule Commonly known as: TESSALON  Take 200 mg by mouth 3 (three) times daily as needed for cough.   bisoprolol  5 MG tablet Commonly known as: ZEBETA  Take 0.5 tablets (2.5 mg total) by mouth daily. Start taking on: November 12, 2023   BLOOD GLUCOSE TEST STRIPS Strp Check BS in the morning, at noon and at bedtime Dx R73.03   Breztri  Aerosphere 160-9-4.8 MCG/ACT Aero inhaler Generic drug: budeson-glycopyrrolate -formoterol  Inhale 2 puffs into the lungs in the morning and at bedtime.   fluocinolone  0.01 % external solution Commonly known as: Synalar  Dribble 2-4 drops in affected ear 3 times a day until sx clear   fluocinonide  cream 0.05 % Commonly known as: LIDEX  Apply 1 Application topically 2 (two) times daily.   furosemide  40 MG tablet Commonly known as: LASIX  Take 1 tablet (40 mg total) by mouth daily.   gabapentin  300 MG capsule Commonly known as: NEURONTIN  Take 1 capsule by mouth 2 (two) times daily.   levothyroxine  100  MCG tablet Commonly known as: SYNTHROID  TAKE 1 TABLET BY MOUTH EVERY DAY BEFORE BREAKFAST   metFORMIN  500 MG 24 hr tablet Commonly known as: GLUCOPHAGE -XR Take 1 tablet (500 mg total) by mouth daily with breakfast.   methocarbamol  500 MG tablet Commonly known as: ROBAXIN  Take 500 mg by mouth every 6 (six) hours as needed for muscle spasms.   montelukast  10 MG tablet Commonly known as: SINGULAIR  TAKE 1 TABLET BY MOUTH EVERYDAY AT BEDTIME   Nucala  100 MG/ML Soaj Generic drug: Mepolizumab  Inject 1 mL (100 mg total) into the skin every 28 (twenty-eight) days. Faxed w patient assistance application   OneTouch Delica Plus Lancet33G Misc Check BS in morning, at noon and at bedtime Dx R73.03   promethazine  25 MG tablet Commonly known as: PHENERGAN  Take 1 tablet (25 mg total) by mouth every 6 (six) hours as needed for nausea or vomiting.   rosuvastatin  40 MG tablet Commonly known as: CRESTOR  Take 1 tablet (40 mg total) by mouth daily.   sertraline  50 MG tablet Commonly known as: ZOLOFT  TAKE 1 TABLET BY MOUTH EVERYDAY AT BEDTIME   traZODone  100 MG tablet Commonly known as: DESYREL  TAKE 3 TABLETS (300MG ) BY MOUTH EVERY DAY AT BEDTIME               Durable Medical Equipment  (From admission, onward)  Start     Ordered   11/11/23 1217  For home use only DME oxygen  Once       Comments: POC for Asthma  Question Answer Comment  Length of Need 6 Months   Mode or (Route) Nasal cannula   Liters per Minute 2   Frequency Continuous (stationary and portable oxygen unit needed)   Oxygen conserving device Yes   Oxygen delivery system Gas      11/11/23 1216            Discharge Assessment: Vitals:   11/11/23 0541 11/11/23 0937  BP: 129/63   Pulse: 73   Resp: 18   Temp: 97.8 F (36.6 C)   SpO2: 91% 90%   Skin clean, dry and intact without evidence of skin break down, no evidence of skin tears noted. IV catheter discontinued intact. Site without signs  and symptoms of complications - no redness or edema noted at insertion site, patient denies c/o pain - only slight tenderness at site.  Dressing with slight pressure applied.  D/c Instructions-Education: Discharge instructions given to patient/family with verbalized understanding. D/c education completed with patient/family including follow up instructions, medication list, d/c activities limitations if indicated, with other d/c instructions as indicated by MD - patient able to verbalize understanding, all questions fully answered. Patient instructed to return to ED, call 911, or call MD for any changes in condition.  Patient escorted via WC, and D/C home via private auto.  Lilton Relic, RN 11/11/2023 12:46 PM

## 2023-11-11 NOTE — Progress Notes (Signed)
 SATURATION QUALIFICATIONS: (This note is used to comply with regulatory documentation for home oxygen)   Patient Saturations on Room Air at Rest = 92   Patient Saturations on Room Air while Ambulating = 84   Patient Saturations on 2 Liters of oxygen while Ambulating = 94   Please briefly explain why patient needs home oxygen: To maintain 02 sat at 90% or above during ambulation.   Demaris Fillers, DO

## 2023-11-11 NOTE — Progress Notes (Signed)
   11/11/23 1312  TOC Discharge Assessment  Final next level of care Home/Self Care  Once discharged, how will the patient get to their discharge location? Family/Friend - Partnered Transport  Has discharge transport plan been identified? Yes  Barriers to Discharge No Barriers Identified  DME Arranged Oxygen  DME Agency [facility]  Date DME Agency Contacted 11/11/23  Time DME Agency Contacted [phone]  Representative spoke with at [facility] Agency [facility]   Patient discharge with 2l/[employer] oxygen, [facility] Provider.

## 2023-11-14 ENCOUNTER — Encounter: Payer: Self-pay | Admitting: Pulmonary Disease

## 2023-11-17 ENCOUNTER — Encounter: Payer: Self-pay | Admitting: Primary Care

## 2023-11-17 ENCOUNTER — Ambulatory Visit: Admitting: Primary Care

## 2023-11-17 VITALS — BP 160/79 | HR 64 | Temp 97.4°F | Ht 64.0 in | Wt 345.0 lb

## 2023-11-17 DIAGNOSIS — J9601 Acute respiratory failure with hypoxia: Secondary | ICD-10-CM | POA: Diagnosis not present

## 2023-11-17 DIAGNOSIS — J9602 Acute respiratory failure with hypercapnia: Secondary | ICD-10-CM

## 2023-11-17 DIAGNOSIS — G4719 Other hypersomnia: Secondary | ICD-10-CM

## 2023-11-17 NOTE — Progress Notes (Signed)
 @Patient  ID: [name], female    DOB: 07-Dec-1960, 63 y.o.   MRN: [id number]  Chief Complaint  Patient presents with   Follow-up    [facility]SP F/U    Referring provider: Roise Cleaver, MD  HPI: 63 year old female, never smoked.  Past medical history significant for hypertension, SVT, migraine headaches, asthma, GERD, hypothyroidism, osteoarthritis, bipolar disorder, mixed hyperlipidemia, borderline diabetes, obesity.  11/17/2023 Discussed the use of AI scribe software for clinical note transcription with the patient, who gave verbal consent to proceed.  History of Present Illness   [name] is a 63 year old female with asthma and supraventricular tachycardia who presents for a hospital follow-up after recent admission for respiratory failure and tachycardia.  She was hospitalized from April 17 to April 19 due to tachycardia and respiratory failure. She presented to the emergency room with an elevated heart rate and an oxygen saturation of 80% on room air, requiring supplemental oxygen for the first time. She was discharged on two liters of oxygen and has not yet had a follow-up sleep study, which was previously ordered but expired.  During her hospital stay, she was started on bisoprolol , taking half a tablet daily, and was advised to discontinue losartan  due to renal concerns. She has a history of using Lasix  as needed for ankle swelling, which she took prior to hospitalization. Her kidney function was slightly impaired during her hospital stay, but an ultrasound showed no hydronephrosis.  She has a history of asthma and is currently on Nucala  injections, which she administers herself, and Breztri  inhalers twice daily, with albuterol  as needed. No recent chest tightness or wheezing and no episodes of increased heart rate since her hospital discharge.  She has been diagnosed with restrictive lung disease and suspected obesity hypoventilation syndrome, contributing to her respiratory  issues. She is currently using two liters of oxygen continuously and has concerns about her quality of life and the impact of her condition.  She has a history of diabetes, managed with metformin , and has discussed potential weight loss treatments with her son, who is 23 years old. She has not been on phentermine  since it was discontinued due to its potential impact on her heart.       Allergies  Allergen Reactions   Venlafaxine Other (See Comments)    Pt refuses to take "makes me feel like I am dying"   Adhesive [Tape] Other (See Comments)    Peeled off skin   Ceclor [Cefaclor] Other (See Comments)    Blisters on hands   Chocolate Other (See Comments)    Migraines   Lisinopril  Cough    Immunization History  Administered Date(s) Administered   DTaP 07/25/2013   Influenza Inj Mdck Quad Pf 04/29/2021, 03/25/2022   Influenza Split 05/08/2013   Influenza, High Dose Seasonal PF 07/07/2014, 05/14/2018, 04/02/2019   Influenza, Mdck, Trivalent,PF 6+ MOS(egg free) 03/30/2023   Influenza, Quadrivalent, Recombinant, Inj, Pf 05/14/2018   Influenza, Seasonal, Injecte, Preservative Fre 07/07/2014   Influenza,inj,Quad PF,6+ Mos 07/07/2014, 07/07/2014, 05/14/2018, 04/02/2019, 03/31/2020   Influenza,trivalent, recombinat, inj, PF 05/08/2013   Influenza-Unspecified 02/23/2016, 05/25/2021   Moderna Covid-19 Fall Seasonal Vaccine 66yrs & older 03/30/2023   Moderna Covid-19 Vaccine Bivalent Booster 17yrs & up 06/01/2021   Moderna SARS-COV2 Booster Vaccination 12/15/2020   Moderna Sars-Covid-2 Vaccination 11/14/2019, 12/12/2019, 06/13/2020   PNEUMOCOCCAL CONJUGATE-20 05/24/2021   Pneumococcal Conjugate-13 12/27/2010   Pneumococcal Polysaccharide-23 05/04/2011   Pneumococcal-Unspecified 05/04/2011, 05/25/2021   Tdap 02/25/2013   Unspecified SARS-COV-2 Vaccination 04/26/2022  Zoster Recombinant(Shingrix ) 08/25/2021, 01/27/2022    Past Medical History:  Diagnosis Date   Anemia    hx of with  pregnancy    Aneurysmal bone cyst, 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   Anxiety    ARF (acute renal failure) (HCC) 07/16/2019   Arthritis    Asthma    Colon polyps    Complex tear of medial meniscus of right knee as current injury 10/22/2013   Depression    Diabetes mellitus without complication (HCC)    GERD (gastroesophageal reflux disease)    Headache    occasional headaches    High cholesterol    Hypertension    Lithium  toxicity 06/2019   has since resolved after discontinuing    PONV (postoperative nausea and vomiting)    Primary insomnia 08/20/2019   Shortness of breath dyspnea    due to weight gain    Sleep apnea    has not use CPAP machine in 2 yrs/ DOES NOT KNOW IF SHE NEEDS TO USE C - PAP   Syncope and collapse    due to severe knee pain    Total knee replacement status 04/16/2014    Tobacco History: Social History   Tobacco Use  Smoking Status Never  Smokeless Tobacco Never   Counseling given: Not Answered   Outpatient Medications Prior to Visit  Medication Sig Dispense Refill   albuterol  (VENTOLIN  HFA) 108 (90 Base) MCG/ACT inhaler Inhale 1-2 puffs into the lungs every 6 (six) hours as needed for wheezing or shortness of breath. 1 each 11   benzonatate  (TESSALON ) 200 MG capsule Take 200 mg by mouth 3 (three) times daily as needed for cough.     bisoprolol  (ZEBETA ) 5 MG tablet Take 0.5 tablets (2.5 mg total) by mouth daily. 30 tablet 1   budeson-glycopyrrolate -formoterol  (BREZTRI  AEROSPHERE) 160-9-4.8 MCG/ACT AERO Inhale 2 puffs into the lungs in the morning and at bedtime. 1 each 12   fluocinolone  (SYNALAR ) 0.01 % external solution Dribble 2-4 drops in affected ear 3 times a day until sx clear 60 mL 2   fluocinonide  cream (LIDEX ) 0.05 % Apply 1 Application topically 2 (two) times daily. 30 g 5   furosemide  (LASIX ) 40 MG tablet Take 1 tablet (40 mg total) by mouth daily. 90 tablet 0   gabapentin  (NEURONTIN ) 300 MG capsule Take 1 capsule by mouth 2 (two) times daily.      Glucose Blood (BLOOD GLUCOSE TEST STRIPS) STRP Check BS in the morning, at noon and at bedtime Dx R73.03 300 strip 3   Lancets (ONETOUCH DELICA PLUS LANCET33G) MISC Check BS in morning, at noon and at bedtime Dx R73.03 300 each 3   levothyroxine  (SYNTHROID ) 100 MCG tablet TAKE 1 TABLET BY MOUTH EVERY DAY BEFORE BREAKFAST 90 tablet 0   Mepolizumab  (NUCALA ) 100 MG/ML SOAJ Inject 1 mL (100 mg total) into the skin every 28 (twenty-eight) days. Faxed w patient assistance application 1 mL 5   metFORMIN  (GLUCOPHAGE -XR) 500 MG 24 hr tablet Take 1 tablet (500 mg total) by mouth daily with breakfast. 90 tablet 3   methocarbamol  (ROBAXIN ) 500 MG tablet Take 500 mg by mouth every 6 (six) hours as needed for muscle spasms.     montelukast  (SINGULAIR ) 10 MG tablet TAKE 1 TABLET BY MOUTH EVERYDAY AT BEDTIME 90 tablet 1   promethazine  (PHENERGAN ) 25 MG tablet Take 1 tablet (25 mg total) by mouth every 6 (six) hours as needed for nausea or vomiting. 90 tablet 5   rosuvastatin  (CRESTOR ) 40 MG tablet  Take 1 tablet (40 mg total) by mouth daily. 90 tablet 0   sertraline  (ZOLOFT ) 50 MG tablet TAKE 1 TABLET BY MOUTH EVERYDAY AT BEDTIME 90 tablet 0   traZODone  (DESYREL ) 100 MG tablet TAKE 3 TABLETS (300MG ) BY MOUTH EVERY DAY AT BEDTIME 270 tablet 0   No facility-administered medications prior to visit.   Review of Systems  Review of Systems  Constitutional: Negative.   HENT: Negative.    Respiratory: Negative.  Negative for cough, shortness of breath and wheezing.   Cardiovascular: Negative.    Physical Exam  BP (!) 160/79 (BP Location: Right Arm, Patient Position: Sitting, Cuff Size: Large)   Pulse 64   Temp (!) 97.4 F (36.3 C) (Temporal)   Ht 5\' 4"  (1.626 m)   Wt (!) 345 lb (156.5 kg)   SpO2 93%   BMI 59.22 kg/m  Physical Exam Constitutional:      Appearance: Normal appearance. She is not ill-appearing.  HENT:     Head: Normocephalic and atraumatic.  Cardiovascular:     Rate and Rhythm: Normal rate  and regular rhythm.  Pulmonary:     Effort: Pulmonary effort is normal.     Breath sounds: Normal breath sounds. No wheezing or rhonchi.  Skin:    General: Skin is warm and dry.  Neurological:     General: No focal deficit present.     Mental Status: She is alert and oriented to person, place, and time. Mental status is at baseline.  Psychiatric:        Thought Content: Thought content normal.        Judgment: Judgment normal.      Lab Results:  CBC    Component Value Date/Time   WBC 11.9 (H) 11/09/2023 2033   RBC 4.68 11/09/2023 2033   HGB 14.8 11/09/2023 2033   HGB 14.5 10/23/2023 1404   HCT 45.7 11/09/2023 2033   HCT 43.9 10/23/2023 1404   PLT 258 11/09/2023 2033   PLT 192 10/23/2023 1404   MCV 97.6 11/09/2023 2033   MCV 97 10/23/2023 1404   MCH 31.6 11/09/2023 2033   MCHC 32.4 11/09/2023 2033   RDW 14.2 11/09/2023 2033   RDW 13.6 10/23/2023 1404   LYMPHSABS 1.7 10/23/2023 1404   MONOABS 0.5 11/15/2022 1613   EOSABS 0.0 10/23/2023 1404   BASOSABS 0.0 10/23/2023 1404    BMET    Component Value Date/Time   NA 137 11/11/2023 0359   NA 138 10/23/2023 1404   K 3.7 11/11/2023 0359   CL 103 11/11/2023 0359   CO2 26 11/11/2023 0359   GLUCOSE 108 (H) 11/11/2023 0359   BUN 15 11/11/2023 0359   BUN 16 10/23/2023 1404   CREATININE 1.33 (H) 11/11/2023 0359   CALCIUM  8.9 11/11/2023 0359   GFRNONAA 46 (L) 11/11/2023 0359   GFRAA 58 (L) 08/18/2020 1635    BNP    Component Value Date/Time   BNP 94.0 11/09/2023 0833    ProBNP    Component Value Date/Time   PROBNP 40.0 11/15/2022 1613    Imaging: ECHOCARDIOGRAM COMPLETE Result Date: 11/10/2023    ECHOCARDIOGRAM REPORT   Patient Name:   [name] Date of Exam: 11/10/2023 Medical Rec #:  [id number]      Height:       64.0 in Accession #:    [id number]     Weight:       351.2 lb Date of Birth:  05-01-1961      BSA:  2.485 m Patient Age:    62 years       BP:           148/45 mmHg Patient Gender: F               HR:           69 bpm. Exam Location:  [facility] Procedure: 2D Echo, Cardiac Doppler, Color Doppler and Intracardiac            Opacification Agent (Both Spectral and Color Flow Doppler were            utilized during procedure). Indications:    Elevated Troponin  History:        Patient has prior history of Echocardiogram examinations, most                 recent 10/11/2012. Signs/Symptoms:Chest Pain and Fatigue.                 Elevated Troponin.  Sonographer:    Clarke Crouch Referring Phys: [id number] Dorma Gash  Sonographer Comments: Technically difficult study due to poor echo windows, suboptimal parasternal window, suboptimal apical window, suboptimal subcostal window and patient is obese. IMPRESSIONS  1. No evidence of LV thrombus by Definity . Left ventricular ejection fraction, by estimation, is 60 to 65%. The left ventricle has normal function. The left ventricle has no regional wall motion abnormalities. There is mild left ventricular hypertrophy.  Left ventricular diastolic parameters are consistent with Grade I diastolic dysfunction (impaired relaxation).  2. Right ventricular systolic function is normal. The right ventricular size is normal. Tricuspid regurgitation signal is inadequate for assessing PA pressure.  3. The mitral valve is grossly normal. No evidence of mitral valve regurgitation. No evidence of mitral stenosis.  4. The aortic valve was not well visualized. Aortic valve regurgitation is not visualized. No aortic stenosis is present.  5. The inferior vena cava is dilated in size with >50% respiratory variability, suggesting right atrial pressure of 8 mmHg. Comparison(s): No prior Echocardiogram. FINDINGS  Left Ventricle: No evidence of LV thrombus by Definity . Left ventricular ejection fraction, by estimation, is 60 to 65%. The left ventricle has normal function. The left ventricle has no regional wall motion abnormalities. Definity  contrast agent was given IV to delineate the  left ventricular endocardial borders. Strain was performed and the global longitudinal strain is indeterminate. The left ventricular internal cavity size was normal in size. There is mild left ventricular hypertrophy. Left ventricular diastolic parameters are consistent with Grade I diastolic dysfunction (impaired relaxation). Normal left ventricular filling pressure. Right Ventricle: The right ventricular size is normal. No increase in right ventricular wall thickness. Right ventricular systolic function is normal. Tricuspid regurgitation signal is inadequate for assessing PA pressure. Left Atrium: Left atrial size was normal in size. Right Atrium: Right atrial size was normal in size. Pericardium: There is no evidence of pericardial effusion. Mitral Valve: The mitral valve is grossly normal. No evidence of mitral valve regurgitation. No evidence of mitral valve stenosis. MV peak gradient, 3.7 mmHg. The mean mitral valve gradient is 2.0 mmHg. Tricuspid Valve: The tricuspid valve is not well visualized. Tricuspid valve regurgitation is not demonstrated. No evidence of tricuspid stenosis. Aortic Valve: The aortic valve was not well visualized. Aortic valve regurgitation is not visualized. No aortic stenosis is present. Aortic valve mean gradient measures 3.0 mmHg. Aortic valve peak gradient measures 5.6 mmHg. Aortic valve area, by VTI measures 2.58 cm. Pulmonic Valve: The pulmonic valve was not  well visualized. Pulmonic valve regurgitation is not visualized. No evidence of pulmonic stenosis. Aorta: The aortic root is normal in size and structure. Venous: The inferior vena cava is dilated in size with greater than 50% respiratory variability, suggesting right atrial pressure of 8 mmHg. IAS/Shunts: No atrial level shunt detected by color flow Doppler. Additional Comments: 3D was performed not requiring image post processing on an independent workstation and was indeterminate.  LEFT VENTRICLE PLAX 2D LVIDd:          5.00 cm   Diastology LVIDs:         2.90 cm   LV e' lateral:   5.77 cm/s LV PW:         1.40 cm   LV E/e' lateral: 12.3 LV IVS:        1.20 cm LVOT diam:     2.10 cm LV SV:         70 LV SV Index:   28 LVOT Area:     3.46 cm  LEFT ATRIUM         Index LA diam:    4.20 cm 1.69 cm/m  AORTIC VALVE                    PULMONIC VALVE AV Area (Vmax):    2.74 cm     PV Vmax:       0.81 m/s AV Area (Vmean):   2.46 cm     PV Peak grad:  2.6 mmHg AV Area (VTI):     2.58 cm AV Vmax:           118.00 cm/s AV Vmean:          78.900 cm/s AV VTI:            0.272 m AV Peak Grad:      5.6 mmHg AV Mean Grad:      3.0 mmHg LVOT Vmax:         93.30 cm/s LVOT Vmean:        56.100 cm/s LVOT VTI:          0.203 m LVOT/AV VTI ratio: 0.75  AORTA Ao Root diam: 3.60 cm MITRAL VALVE MV Area (PHT): 4.71 cm    SHUNTS MV Area VTI:   2.47 cm    Systemic VTI:  0.20 m MV Peak grad:  3.7 mmHg    Systemic Diam: 2.10 cm MV Mean grad:  2.0 mmHg MV Vmax:       0.97 m/s MV Vmean:      65.6 cm/s MV Decel Time: 161 msec MV E velocity: 71.10 cm/s MV A velocity: 88.30 cm/s MV E/A ratio:  0.81 Vishnu Priya Mallipeddi Electronically signed by Lucetta Russel Mallipeddi Signature Date/Time: 11/10/2023/4:11:54 PM    Final    US  RENAL Result Date: 11/10/2023 CLINICAL DATA:  Acute kidney injury EXAM: RENAL / URINARY TRACT ULTRASOUND COMPLETE COMPARISON:  None Available. FINDINGS: Right Kidney: Length = 13.0 cm Normal parenchymal echogenicity with preserved corticomedullary differentiation. No urinary tract dilation or shadowing calculi. The ureter is not seen. Left Kidney: Length = 13.4 cm Normal parenchymal echogenicity with preserved corticomedullary differentiation. No urinary tract dilation or shadowing calculi. The ureter is not seen. Bladder: Appears normal for degree of bladder distention. Other: Diffusely increased hepatic echogenicity can be seen in the setting of infiltrative process such as steatosis. IMPRESSION: 1. No urinary tract dilation or  shadowing calculi. 2. Diffusely increased hepatic echogenicity can be seen in the setting of  infiltrative process such as steatosis. Electronically Signed   By: Limin  Xu M.D.   On: 11/10/2023 14:21   CT Angio Chest PE W and/or Wo Contrast Result Date: 11/09/2023 CLINICAL DATA:  High probability for PE. Chest pain. Shortness of breath. EXAM: CT ANGIOGRAPHY CHEST WITH CONTRAST TECHNIQUE: Multidetector CT imaging of the chest was performed using the standard protocol during bolus administration of intravenous contrast. Multiplanar CT image reconstructions and MIPs were obtained to evaluate the vascular anatomy. RADIATION DOSE REDUCTION: This exam was performed according to the departmental dose-optimization program which includes automated exposure control, adjustment of the mA and/or kV according to patient size and/or use of iterative reconstruction technique. CONTRAST:  75mL OMNIPAQUE  IOHEXOL  350 MG/ML SOLN COMPARISON:  CT angiographic chest 02/08/2019. FINDINGS: Cardiovascular: Examination is mildly technically limited secondary to timing of the contrast bolus and body habitus. No pulmonary embolism identified. Aorta is normal in size. The heart is moderately enlarged. There is no pericardial effusion. Mediastinum/Nodes: No enlarged mediastinal, hilar, or axillary lymph nodes. Thyroid  gland, trachea, and esophagus demonstrate no significant findings. Lungs/Pleura: There is a stable 8 mm fissural nodule on the right image 6/75. The lungs are otherwise clear. There is no pleural effusion or pneumothorax Upper Abdomen: No acute abnormality. Musculoskeletal: No chest wall abnormality. No acute or significant osseous findings. Review of the MIP images confirms the above findings. IMPRESSION: 1. No evidence for pulmonary embolism. 2. Moderate cardiomegaly. 3. Stable 8 mm fissural nodule on the right. This is unchanged from 2020 and favored as benign. Electronically Signed   By: Tyron Gallon M.D.   On: 11/09/2023  23:31   DG Chest Port 1 View Result Date: 11/09/2023 CLINICAL DATA:  Chest pain EXAM: PORTABLE CHEST 1 VIEW COMPARISON:  Chest x-ray 11/15/2022 FINDINGS: The heart is enlarged. There are minimal strandy opacities in the lung bases favored is atelectasis. No definite pleural effusion or pneumothorax. No acute fractures are seen. IMPRESSION: 1. Cardiomegaly. 2. Minimal strandy opacities in the lung bases favored is atelectasis. Electronically Signed   By: Tyron Gallon M.D.   On: 11/09/2023 21:17     Assessment & Plan:   1. Excessive daytime sleepiness (Primary) - Split night study; Future  2. Acute respiratory failure with hypoxia and hypercarbia (HCC) - Split night study; Future   Assessment and Plan    Chronic respiratory failure with hypoxia Chronic respiratory failure with hypoxia likely due to restrictive lung disease and obesity hypoventilation syndrome. Continuous oxygen use necessary, especially during activities. Portable oxygen concentrators may not be covered by insurance. - Continue 2 liters of oxygen. - Order split night sleep study in the lab. - Advise use of oxygen during showering. - Recommend obtaining a pulse oximeter for home monitoring. - Discuss portable oxygen concentrator options and insurance coverage.  Obesity hypoventilation syndrome Suspected obesity hypoventilation syndrome contributing to respiratory failure. Sleep study needed to evaluate for obstructive sleep apnea and potential CPAP or BiPAP therapy.  Severe persistent asthma Asthma controlled with Nucala  and Breztri . No recent exacerbations.  - Continue Nucala  injections. - Continue Breztri  Aerosphere inhaler twice daily. - Use albuterol  hfa 2 puffs every 4-6 hours as needed.  Obesity Obesity contributing to respiratory and cardiac issues. Weight loss interventions discussed, pending cardiology clearance. Weight loss injections may be safer than phentermine . - Discuss weight loss injections with  cardiology for clearance. - Consider cardiopulmonary rehabilitation for monitored exercise.  Mild diastolic heart failure Mild diastolic heart failure noted on echocardiogram. Monitor for fluid overload and manage with  Lasix  as needed. - Discuss diastolic heart failure and Lasix  use with cardiology. - Monitor for signs of fluid overload. - Consider compression stockings and leg elevation for edema.  Supraventricular tachycardia Supraventricular tachycardia managed with bisoprolol . No recent episodes. Bisoprolol  effectively controlling heart rate. - Continue bisoprolol  as prescribed.  Type 2 diabetes mellitus Type 2 diabetes managed with metformin . Discussed potential use of Ozempic for weight management and diabetes control, pending insurance coverage and cardiology clearance. - Continue metformin . - Discuss potential use of Ozempic with primary care and cardiology.      Antonio Baumgarten, NP 11/17/2023

## 2023-11-17 NOTE — Patient Instructions (Signed)
 -  CHRONIC RESPIRATORY FAILURE WITH HYPOXIA: Chronic respiratory failure with hypoxia means your lungs are not getting enough oxygen into your blood. You need to continue using 2 liters of oxygen, especially during activities and showering. We will order a sleep study to check for sleep apnea and recommend getting a pulse oximeter to monitor your oxygen levels at home. We also discussed options for portable oxygen concentrators and insurance coverage. You can qualify for POC during visit in MAY.  -OBESITY HYPOVENTILATION SYNDROME: Obesity hypoventilation syndrome is a condition where excess weight affects your breathing, especially during sleep. We need to do a sleep study to see if you have sleep apnea and if you might need CPAP or BiPAP therapy.  -ASTHMA: Asthma is a condition where your airways narrow and swell, making it hard to breathe. Your asthma is currently well-controlled with Nucala  injections and Breztri  inhalers. Continue using these medications as prescribed and use albuterol  as needed.  -OBESITY: Obesity can contribute to both respiratory and heart issues. We discussed weight loss options, including weight loss injections, but you need cardiology clearance first. We also considered cardiopulmonary rehabilitation for monitored exercise. I will refer.   -MILD DIASTOLIC HEART FAILURE: Mild diastolic heart failure means your heart has trouble relaxing and filling with blood. We will discuss this with cardiology and monitor for fluid overload. You may need to use Lasix  as needed and consider compression stockings and leg elevation for swelling.  -SUPRAVENTRICULAR TACHYCARDIA: Supraventricular tachycardia is a condition where your heart suddenly beats much faster than normal. It is currently well-managed with bisoprolol , and you should continue taking it as prescribed.  -TYPE 2 DIABETES MELLITUS: Type 2 diabetes is a condition where your body does not use insulin  properly, leading to high  blood sugar levels. You are managing it with metformin . We discussed the potential use of Ozempic for weight management and diabetes control, pending insurance coverage and cardiology clearance. Your PCP would likely be the one to prescribe this.   INSTRUCTIONS:  Please continue using 2 liters of oxygen continuously and during activities like showering. Schedule a split night sleep study in the lab. Obtain a pulse oximeter for home monitoring. Discuss weight loss injections and diastolic heart failure management with your cardiologist. Continue your current medications as prescribed. Follow up with primary care and cardiology to discuss the potential use of Ozempic for diabetes and weight management.  Follow-up: Keep apt in May with [name]

## 2023-11-24 ENCOUNTER — Telehealth: Payer: Self-pay

## 2023-11-24 NOTE — Telephone Encounter (Signed)
 Copied from CRM [id number]. Topic: Appointments - Scheduling Inquiry for Clinic >> Nov 24, 2023 11:51 AM [name] wrote: Reason for CRM: Patient is calling in to request information on when her split night sleep study is going to be scheduled. Please reach back out to patient regarding scheduling, states it will be done with Maryan Smalling.

## 2023-12-07 ENCOUNTER — Encounter: Payer: Self-pay | Admitting: Student

## 2023-12-07 ENCOUNTER — Ambulatory Visit: Attending: Student | Admitting: Student

## 2023-12-07 VITALS — BP 122/82 | HR 63 | Ht 64.0 in | Wt 337.2 lb

## 2023-12-07 DIAGNOSIS — E119 Type 2 diabetes mellitus without complications: Secondary | ICD-10-CM | POA: Diagnosis not present

## 2023-12-07 DIAGNOSIS — E782 Mixed hyperlipidemia: Secondary | ICD-10-CM

## 2023-12-07 DIAGNOSIS — R7989 Other specified abnormal findings of blood chemistry: Secondary | ICD-10-CM | POA: Diagnosis not present

## 2023-12-07 DIAGNOSIS — E039 Hypothyroidism, unspecified: Secondary | ICD-10-CM

## 2023-12-07 DIAGNOSIS — Z79899 Other long term (current) drug therapy: Secondary | ICD-10-CM

## 2023-12-07 DIAGNOSIS — N179 Acute kidney failure, unspecified: Secondary | ICD-10-CM | POA: Diagnosis not present

## 2023-12-07 DIAGNOSIS — I10 Essential (primary) hypertension: Secondary | ICD-10-CM

## 2023-12-07 DIAGNOSIS — I471 Supraventricular tachycardia, unspecified: Secondary | ICD-10-CM | POA: Diagnosis not present

## 2023-12-07 DIAGNOSIS — J9611 Chronic respiratory failure with hypoxia: Secondary | ICD-10-CM | POA: Diagnosis not present

## 2023-12-07 MED ORDER — BISOPROLOL FUMARATE 5 MG PO TABS: 2.5000 mg | ORAL_TABLET | Freq: Every day | ORAL | 3 refills | Status: AC

## 2023-12-07 NOTE — Progress Notes (Signed)
 Cardiology Office Note    Date:  12/07/2023  ID:  [name], DOB March 22, 1961, MRN [id number] Cardiologist: Vishnu P Mallipeddi, MD    History of Present Illness:    [name] is a 63 y.o. female with a past medical history of HTN, HLD, Type 2 DM, hypothyroidism, asthma, OSA (intolerant to CPAP), anxiety and depression who presents to the office today for hospital follow-up.  She was most recently admitted to [facility] in 10/2023 for evaluation of chest pain and shortness of breath which had started the afternoon of arrival. Was found to be in SVT and ultimately required Adenosine  with conversion back to normal sinus rhythm. Hs troponin values were mildly elevated, peaking at 132. Did have an AKI with creatinine at 1.83 on admission (improved to 1.33 at discharge). CTA showed no evidence of a PE. An echocardiogram was obtained for further assessment and showed a preserved EF of 60 to 65% with no regional wall motion abnormalities. She did have mild LVH, grade 1 diastolic dysfunction, normal RV function and no significant valve abnormalities. She was started on Bisoprolol  2.5 mg daily and was recommended to refer to EP as an outpatient for consideration of SVT ablation if she had recurrence. She did have acute hypoxic respiratory failure during admission which was felt to be due to chronic hypoxia at home and likely related to atelectatic disease and hypoventilation. Was discharged on 2 L nasal cannula and informed to keep follow-up with Pulmonology.  She did follow- up with Pulmonology on 11/17/2023 and was still using 2 L nasal cannula at that time. Was recommended to continue and a split-night sleep study was ordered as well.  Was encouraged to review with her PCP in regards to starting Ozempic if cleared by Cardiology to do so.  In talking with the patient today, she reports overall doing well since her recent hospitalization. She is only using supplemental oxygen at night and then as  needed during the day. Reports oxygen saturation overall remains appropriate except for when she is in the shower. Denies any specific orthopnea or PND. No recent lower extremity edema and she only takes Lasix  40 mg as needed but has not utilized this recently. Denies any recent chest pain or palpitations.  She has made significant dietary changes and has quit consuming regular sodas and is now trying to consume more fruit as compared to potato chips. Her weight has declined by 14 pounds since hospital discharge.   Studies Reviewed:   EKG: EKG is not ordered today.  Echocardiogram: 10/2023 IMPRESSIONS     1. No evidence of LV thrombus by Definity . Left ventricular ejection  fraction, by estimation, is 60 to 65%. The left ventricle has normal  function. The left ventricle has no regional wall motion abnormalities.  There is mild left ventricular hypertrophy.   Left ventricular diastolic parameters are consistent with Grade I  diastolic dysfunction (impaired relaxation).   2. Right ventricular systolic function is normal. The right ventricular  size is normal. Tricuspid regurgitation signal is inadequate for assessing  PA pressure.   3. The mitral valve is grossly normal. No evidence of mitral valve  regurgitation. No evidence of mitral stenosis.   4. The aortic valve was not well visualized. Aortic valve regurgitation  is not visualized. No aortic stenosis is present.   5. The inferior vena cava is dilated in size with >50% respiratory  variability, suggesting right atrial pressure of 8 mmHg.   Comparison(s): No prior Echocardiogram.  Physical Exam:   VS:  BP 122/82 (BP Location: Right Arm, Patient Position: Sitting, Cuff Size: Large)   Pulse 63   Ht 5\' 4"  (1.626 m)   Wt (!) 337 lb 3.2 oz (153 kg)   SpO2 91%   BMI 57.88 kg/m    Wt Readings from Last 3 Encounters:  12/07/23 (!) 337 lb 3.2 oz (153 kg)  11/17/23 (!) 345 lb (156.5 kg)  11/10/23 (!) 351 lb 3.1 oz (159.3 kg)      GEN: Pleasant female appearing in no acute distress NECK: No JVD; No carotid bruits CARDIAC: RRR, no murmurs, rubs, gallops RESPIRATORY:  Clear to auscultation without rales, wheezing or rhonchi  ABDOMEN: Appears non-distended. No obvious abdominal masses. EXTREMITIES: No clubbing or cyanosis. No pitting edema.  Distal pedal pulses are 2+ bilaterally.   Assessment and Plan:   1. SVT - The presenting rhythm at the time of her recent admission was felt to be most consistent with SVT and she did convert back to normal sinus rhythm with IV Adenosine . Her arrhythmia occurred in the setting of hypoxia, dehydration and recent initiation of Phentermine  (which has now been discontinued). Continue Bisoprolol  2.5 mg daily for now. We reviewed that she could take an extra 2.5 mg if needed for breakthrough palpitations. If she has recurrent documented SVT in the future, would refer to EP for consideration of ablation.  2. Elevated Troponin Values/HLD - Hs Troponin peaked at 132 during admission and was down-trending to 116 on recheck.  Echocardiogram at that time showed a preserved EF of 60 to 65% with no regional wall motion abnormalities and enzyme elevation was felt to be due to demand ischemia in the setting of her arrhythmia. - She denies any recent anginal symptoms. Continue with risk factor modification. She does have known hyperlipidemia and LDL was at 73 when checked in 09/2023. Continue Crestor  40 mg daily.  3. HTN - Blood pressure is well-controlled at 122/82 during today's visit. Continue Bisoprolol  2.5 mg daily.  4. Hypothyroidism - TSH was at 1.340 during recent admission. Continue current medical therapy with Levothyroxine  100 mcg daily.  5. History of AKI - Creatinine peaked at 1.83 during her recent admission and had improved to 1.33 at the time of hospital discharge. Will recheck a BMET for reassessment of renal function and electrolytes.  6. Type 2 DM - Hemoglobin A1c was 6.6  when checked in 10/2023. She remains on Metformin  at this time.  She has lost over 14 pounds since hospital discharge with dietary changes and was congratulated on this. She plans to review the option of Ozempic with her PCP and from a cardiac perspective, there would not be any contraindications to this.  She was encouraged to continue with dietary modifications and increasing her physical activity in the interim.  7. Severe Persistent Asthma/Chronic Hypoxic Respiratory Failure - Remains on 2 L nasal cannula at night and as needed during the day. Followed by Pulmonology and planning to undergo a sleep study next month.  Signed, Dorma Gash, PA-C

## 2023-12-07 NOTE — Patient Instructions (Signed)
 Medication Instructions:  Your physician recommends that you continue on your current medications as directed. Please refer to the Current Medication list given to you today.  *If you need a refill on your cardiac medications before your next appointment, please call your pharmacy*  Lab Work: Your physician recommends that you return for lab work. BMET   If you have labs (blood work) drawn today and your tests are completely normal, you will receive your results only by: MyChart Message (if you have MyChart) OR A paper copy in the mail If you have any lab test that is abnormal or we need to change your treatment, we will call you to review the results.  Testing/Procedures: NONE   Follow-Up: At [facility], you and your health needs are our priority.  As part of our continuing mission to provide you with exceptional heart care, our providers are all part of one team.  This team includes your primary Cardiologist (physician) and Advanced Practice Providers or APPs (Physician Assistants and Nurse Practitioners) who all work together to provide you with the care you need, when you need it.  Your next appointment:   6 month(s)  Provider:   Woodfin Hays, PA-C    We recommend signing up for the patient portal called "MyChart".  Sign up information is provided on this After Visit Summary.  MyChart is used to connect with patients for Virtual Visits (Telemedicine).  Patients are able to view lab/test results, encounter notes, upcoming appointments, etc.  Non-urgent messages can be sent to your provider as well.   To learn more about what you can do with MyChart, go to [email or web address].   Other Instructions Thank you for choosing [facility]!

## 2023-12-11 DIAGNOSIS — J9601 Acute respiratory failure with hypoxia: Secondary | ICD-10-CM | POA: Diagnosis not present

## 2023-12-11 DIAGNOSIS — R0602 Shortness of breath: Secondary | ICD-10-CM | POA: Diagnosis not present

## 2023-12-11 DIAGNOSIS — J4520 Mild intermittent asthma, uncomplicated: Secondary | ICD-10-CM | POA: Diagnosis not present

## 2023-12-12 DIAGNOSIS — J4520 Mild intermittent asthma, uncomplicated: Secondary | ICD-10-CM | POA: Diagnosis not present

## 2023-12-12 DIAGNOSIS — J9601 Acute respiratory failure with hypoxia: Secondary | ICD-10-CM | POA: Diagnosis not present

## 2023-12-12 DIAGNOSIS — R0602 Shortness of breath: Secondary | ICD-10-CM | POA: Diagnosis not present

## 2023-12-20 ENCOUNTER — Ambulatory Visit: Payer: Medicare HMO | Admitting: Pulmonary Disease

## 2023-12-20 ENCOUNTER — Encounter: Payer: Self-pay | Admitting: Pulmonary Disease

## 2023-12-20 VITALS — BP 130/78 | HR 70 | Temp 97.9°F | Ht 64.0 in | Wt 332.2 lb

## 2023-12-20 DIAGNOSIS — J9601 Acute respiratory failure with hypoxia: Secondary | ICD-10-CM

## 2023-12-20 DIAGNOSIS — J4520 Mild intermittent asthma, uncomplicated: Secondary | ICD-10-CM

## 2023-12-20 DIAGNOSIS — F17210 Nicotine dependence, cigarettes, uncomplicated: Secondary | ICD-10-CM

## 2023-12-20 DIAGNOSIS — J9602 Acute respiratory failure with hypercapnia: Secondary | ICD-10-CM

## 2023-12-20 NOTE — Patient Instructions (Signed)
I am glad you are doing well  No changes to medication  Return to clinic in 6 months or sooner as needed with Dr. Judeth Horn

## 2023-12-20 NOTE — Progress Notes (Signed)
 Synopsis: Referred for cough, dyspnea, possible history of asthma by Roise Cleaver, MD  Subjective:   PATIENT ID: [name] GENDER: female DOB: 11-21-1960, MRN: [id number]  Chief Complaint  Patient presents with   Follow-up    Asthma, SOB on exertion, some occasional tightness in chest    63 y.o. woman with history of asthma whom are seen for evaluation of shortness of breath and cough felt related to poorly controlled asthma with symptoms overall improved since starting Nucala  in 2024.  Most recent pulmonary note Irby Mannan, NP 10/2022 reviewed.  Discharge summary 10/2022 reviewed.  Doing a bit better on Nucala .  Symptoms cough shortness of breath at baseline.  Using albuterol  1-2 times a week.  Not bad.  We discussed using her oxygen.  This is newly prescribed hospitalization, recent hospitalization.  Oxygen saturation largely stayed above 90%.  With a lot of exertion dropped down to 85%.  Advised her to wear with these activities.  Advised to wear at night as well.  Has upcoming split-night study.  HPI initial visit: Seen by PCP 8/5. Maintained on singulair , trelegy and started on prednisone  taper, levaquin . She doesn't note much difference with trelegy as opposed to breo.   She says she has DOE that's been on and off, but lately it has been progressive over last 3 months. DOE to 50 ft currently. She has some accompanying chest pain that is worse with coughing. Pleuritic after a bad coughing spell. Had bronchitis about 3 weeks ago given above tx, she feels tessalon  perles were probably more helpful than prednisone . She never took the levaquin . She doesn't note any worsening orthopnea - she always feels smothered when she lies on her back. Some worsening swelling in R>L foot.   Smoked very little when she was in middle school. She works for [employer] in [place] division. 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 She has lived in [place] for a  period. She has a cat. No pet bird or hot tub.  No first degree relatives with lung disease.   Interval HPI:  PSG not scheduled, PFTs with mild restriction today. Had been off of breztri  for a month prior to PFT. Has felt much worse off of breztri . Increased DOE. Some cough but not especially bothersome, only when she first gets up.   She is using her albuterol  inhaler maybe once weekly currently.    Otherwise pertinent review of systems is negative.  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   Anxiety    ARF (acute renal failure) (HCC) 07/16/2019   Arthritis    Asthma    Colon polyps    Complex tear of medial meniscus of right knee as current injury 10/22/2013   Depression    Diabetes mellitus without complication (HCC)    GERD (gastroesophageal reflux disease)    Headache    occasional headaches    High cholesterol    Hypertension    Lithium  toxicity 06/2019   has since resolved after discontinuing    PONV (postoperative nausea and vomiting)    Primary insomnia 08/20/2019   Shortness of breath dyspnea    due to weight gain    Sleep apnea    has not use CPAP machine in 2 yrs/ DOES NOT KNOW IF SHE NEEDS TO USE C - PAP   Syncope and collapse  due to severe knee pain    Total knee replacement status 04/16/2014     Family History  Problem Relation Age of Onset   Alcohol  abuse Paternal Grandfather    Tuberculosis Paternal Grandfather    Breast cancer Maternal Grandmother    Cancer Maternal Grandmother        breast   Heart attack Maternal Grandfather    Hyperlipidemia Father    Hypertension Father    Heart disease Father 39       CABG   Gout Father    Arthritis Mother    Heart disease Mother    Cancer Brother        colon, lung, liver   Hypertension Sister    Cancer Daughter        NEUROBLASTOMA     Past Surgical History:  Procedure Laterality Date   BACK SURGERY  07/25/2010    lumb fusion   CARPAL TUNNEL RELEASE  07/25/2005   both hands   COLONOSCOPY     ENDOMETRIAL ABLATION W/ NOVASURE     EXCISION METACARPAL MASS Right 03/10/2014   Procedure: EXCISION MASS RIGHT HAND FIRST WEB SPACE DORSAL PALMAR INCISION ;  Surgeon: Lyanne Sample, MD;  Location: [facility];  Service: Orthopedics;  Laterality: Right;   EXCISIONAL TOTAL KNEE ARTHROPLASTY WITH ANTIBIOTIC SPACERS Left 09/19/2019   Procedure: EXCISIONAL  LEFT TOTAL KNEE ARTHROPLASTY WITH TOTAL KNEE REVISION;  Surgeon: Claiborne Crew, MD;  Location: [facility];  Service: Orthopedics;  Laterality: Left;  2 hrs   IRRIGATION AND DEBRIDEMENT KNEE Left 05/23/2014   Procedure: IRRIGATION AND DEBRIDEMENT KNEE;  Surgeon: Bevin Bucks, MD;  Location: [facility];  Service: Orthopedics;  Laterality: Left;  With POLYETHYLENE EXCHANGE   JOINT REPLACEMENT     KNEE ARTHROSCOPY     let knee 03/2013    KNEE ARTHROSCOPY WITH MEDIAL MENISECTOMY Right 10/22/2013   Procedure: RIGHT KNEE ARTHROSCOPY WITH MEDIAL MENISECTOMY, abrasion chrondrplasty Everett Hitt OUT/CORRECT CURETTEMENT/BONE GRAFT/PROXIMAL TIBIA ;  Surgeon: Florencia Hunter, MD;  Location: [facility];  Service: Orthopedics;  Laterality: Right;   SPINE SURGERY     TOTAL KNEE ARTHROPLASTY Left 04/16/2014   Procedure: LEFT TOTAL KNEE ARTHROPLASTY;  Surgeon: Florencia Hunter, MD;  Location: [facility];  Service: Orthopedics;  Laterality: Left;   TOTAL KNEE ARTHROPLASTY Right 12/23/2014   Procedure: TOTAL KNEE ARTHROPLASTY;  Surgeon: Hazle Lites, MD;  Location: [facility];  Service: Orthopedics;  Laterality: Right;   trigger finger release surgeyr      bilateral    TUBAL LIGATION      Social History   Socioeconomic History   Marital status: Married    Spouse name: Dee Farber   Number of children: 2   Years of education: 12   Highest education level: Some college, no degree  Occupational History    Comment: Disabled  Tobacco Use   Smoking status: Never   Smokeless tobacco: Never   Vaping Use   Vaping status: Never Used  Substance and Sexual Activity   Alcohol  use: No   Drug use: No   Sexual activity: Yes    Birth control/protection: Surgical    Comment: tubal & ablation  Other Topics Concern   Not on file  Social History Narrative   Lives with her husband who helps with grocery shopping and other transportation needs.     Has two children, Dorla Gartner and Mariah Shines and grandchildren - they all live nearby.   There are two outside dogs and several  Horses.  Social Drivers of Corporate investment banker Strain: Low Risk  (07/24/2023)   Overall Financial Resource Strain (CARDIA)    Difficulty of Paying Living Expenses: Not very hard  Food Insecurity: No Food Insecurity (11/10/2023)   Hunger Vital Sign    Worried About Running Out of Food in the Last Year: Never true    Ran Out of Food in the Last Year: Never true  Transportation Needs: No Transportation Needs (11/10/2023)   PRAPARE - Administrator, Civil Service (Medical): No    Lack of Transportation (Non-Medical): No  Physical Activity: Inactive (07/24/2023)   Exercise Vital Sign    Days of Exercise per Week: 0 days    Minutes of Exercise per Session: 0 min  Stress: No Stress Concern Present (07/24/2023)   [employer] of Occupational Health - Occupational Stress Questionnaire    Feeling of Stress : Only a little  Social Connections: Socially Integrated (07/24/2023)   Social Connection and Isolation Panel [NHANES]    Frequency of Communication with Friends and Family: Twice a week    Frequency of Social Gatherings with Friends and Family: Three times a week    Attends Religious Services: More than 4 times per year    Active Member of Clubs or Organizations: Yes    Attends Banker Meetings: More than 4 times per year    Marital Status: Married  Catering manager Violence: Not At Risk (11/10/2023)   Humiliation, Afraid, Rape, and Kick questionnaire    Fear of Current or  Ex-Partner: No    Emotionally Abused: No    Physically Abused: No    Sexually Abused: No     Allergies  Allergen Reactions   Venlafaxine Other (See Comments)    Pt refuses to take "makes me feel like I am dying"   Phentermine      Tachycardia   Adhesive [Tape] Other (See Comments)    Peeled off skin   Ceclor [Cefaclor] Other (See Comments)    Blisters on hands   Chocolate Other (See Comments)    Migraines   Lisinopril  Cough     Outpatient Medications Prior to Visit  Medication Sig Dispense Refill   albuterol  (VENTOLIN  HFA) 108 (90 Base) MCG/ACT inhaler Inhale 1-2 puffs into the lungs every 6 (six) hours as needed for wheezing or shortness of breath. 1 each 11   benzonatate  (TESSALON ) 200 MG capsule Take 200 mg by mouth 3 (three) times daily as needed for cough.     bisoprolol  (ZEBETA ) 5 MG tablet Take 0.5 tablets (2.5 mg total) by mouth daily. 45 tablet 3   budeson-glycopyrrolate -formoterol  (BREZTRI  AEROSPHERE) 160-9-4.8 MCG/ACT AERO Inhale 2 puffs into the lungs in the morning and at bedtime. 1 each 12   fluocinolone  (SYNALAR ) 0.01 % external solution Dribble 2-4 drops in affected ear 3 times a day until sx clear 60 mL 2   fluocinonide  cream (LIDEX ) 0.05 % Apply 1 Application topically 2 (two) times daily. 30 g 5   furosemide  (LASIX ) 40 MG tablet Take 1 tablet (40 mg total) by mouth daily. 90 tablet 0   gabapentin  (NEURONTIN ) 300 MG capsule Take 1 capsule by mouth 2 (two) times daily.     Glucose Blood (BLOOD GLUCOSE TEST STRIPS) STRP Check BS in the morning, at noon and at bedtime Dx R73.03 300 strip 3   Lancets (ONETOUCH DELICA PLUS LANCET33G) MISC Check BS in morning, at noon and at bedtime Dx R73.03 300 each 3   levothyroxine  (SYNTHROID ) 100  MCG tablet TAKE 1 TABLET BY MOUTH EVERY DAY BEFORE BREAKFAST 90 tablet 0   Mepolizumab  (NUCALA ) 100 MG/ML SOAJ Inject 1 mL (100 mg total) into the skin every 28 (twenty-eight) days. Faxed w patient assistance application 1 mL 5   metFORMIN   (GLUCOPHAGE -XR) 500 MG 24 hr tablet Take 1 tablet (500 mg total) by mouth daily with breakfast. 90 tablet 3   methocarbamol  (ROBAXIN ) 500 MG tablet Take 500 mg by mouth every 6 (six) hours as needed for muscle spasms.     montelukast  (SINGULAIR ) 10 MG tablet TAKE 1 TABLET BY MOUTH EVERYDAY AT BEDTIME 90 tablet 1   promethazine  (PHENERGAN ) 25 MG tablet Take 1 tablet (25 mg total) by mouth every 6 (six) hours as needed for nausea or vomiting. 90 tablet 5   rosuvastatin  (CRESTOR ) 40 MG tablet Take 1 tablet (40 mg total) by mouth daily. 90 tablet 0   sertraline  (ZOLOFT ) 50 MG tablet TAKE 1 TABLET BY MOUTH EVERYDAY AT BEDTIME 90 tablet 0   traZODone  (DESYREL ) 100 MG tablet TAKE 3 TABLETS (300MG ) BY MOUTH EVERY DAY AT BEDTIME 270 tablet 0   No facility-administered medications prior to visit.       Objective:   Physical Exam:  General appearance: 63 y.o., female, NAD, conversant  Eyes: anicteric sclerae; PERRL, tracking appropriately HENT: NCAT; MMM Neck: Trachea midline; no lymphadenopathy, no JVD Lungs: CTAB, no crackles, no wheeze, with normal respiratory effort CV: RRR, no murmur  Abdomen: Soft, non-tender; non-distended, BS present  Extremities: No peripheral edema, warm Skin: Normal turgor and texture; no rash Psych: Appropriate affect Neuro: Alert and oriented to person and place, no focal deficit     Vitals:   12/20/23 1324  BP: 130/78  Pulse: 70  Temp: 97.9 F (36.6 C)  SpO2: 90%  Weight: (!) 332 lb 3.2 oz (150.7 kg)  Height: 5\' 4"  (1.626 m)      90% on RA BMI Readings from Last 3 Encounters:  12/20/23 57.02 kg/m  12/07/23 57.88 kg/m  11/17/23 59.22 kg/m   Wt Readings from Last 3 Encounters:  12/20/23 (!) 332 lb 3.2 oz (150.7 kg)  12/07/23 (!) 337 lb 3.2 oz (153 kg)  11/17/23 (!) 345 lb (156.5 kg)     CBC    Component Value Date/Time   WBC 11.9 (H) 11/09/2023 2033   RBC 4.68 11/09/2023 2033   HGB 14.8 11/09/2023 2033   HGB 14.5 10/23/2023 1404    HCT 45.7 11/09/2023 2033   HCT 43.9 10/23/2023 1404   PLT 258 11/09/2023 2033   PLT 192 10/23/2023 1404   MCV 97.6 11/09/2023 2033   MCV 97 10/23/2023 1404   MCH 31.6 11/09/2023 2033   MCHC 32.4 11/09/2023 2033   RDW 14.2 11/09/2023 2033   RDW 13.6 10/23/2023 1404   LYMPHSABS 1.7 10/23/2023 1404   MONOABS 0.5 11/15/2022 1613   EOSABS 0.0 10/23/2023 1404   BASOSABS 0.0 10/23/2023 1404    Low level eosinophilia historically  Chest Imaging: CTA Chest 01/2019 reviewed by me and remarkable for 8mm nodule near minor fissure, no LAD, some likely dependent atelectasis.   CXR 12/2019 reviewed by me and unremarkable  06/2019 NM perfusion: no perfusion defect  Pulmonary Functions Testing Results:    Latest Ref Rng & Units 01/16/2023   10:24 AM 04/01/2021   11:33 AM  PFT Results  FVC-Pre L 2.34  2.54   FVC-Predicted Pre % 70  72   FVC-Post L 2.27  2.50   FVC-Predicted Post %  68  71   Pre FEV1/FVC % % 87  86   Post FEV1/FCV % % 87  90   FEV1-Pre L 2.03  2.18   FEV1-Predicted Pre % 78  79   FEV1-Post L 1.98  2.25   DLCO uncorrected ml/min/mmHg 21.38  19.46   DLCO UNC% % 105  91   DLCO corrected ml/min/mmHg 21.79  20.40   DLCO COR %Predicted % 107  96   DLVA Predicted % 142  127   TLC L 3.87  4.53   TLC % Predicted % 76  87   RV % Predicted % 71  72     PFTs reviewed by me showing nonspecific ventilatory defect, equivocal results for air trapping and elevated DLCO for alveolar volume  Echocardiogram:   TTE 2021: mildly dilated LAD, LV hypertrophy, normal EF.  TTE 2014: G1DD  Nuclear stress 2014 report essentially normal       Assessment & Plan:   # Dyspnea on exertion: Overall improved with triple inhaled therapy via Breztri .  Still with exertional dyspnea.  Suspect related to poorly controlled asthma vs bronchomalacia particularly R main stem seen CT 2020  # Chronic cough: Suspect multifactorial related to postnasal drip possible GERD.  Large driver includes poorly  controlled asthma.  Escalate therapies as below.  Encouraged adherence to inhalers as well as Flonase  as needed.  #Severe persistent asthma with eosinophilia: Improved overall with biologic therapy.  Eosinophils mildly elevated. Started Nucala  06/19/23.  # Chronic hypoxemia: Likely OHS OSA.  Encouraged to continue oxygen.  Plan: - Continue Nucala  - Continue Breztri  - albuterol  2 puffs as needed - flonase  - Use 2 L with exertion, use 2 L at night, follow-up results of upcoming split-night study/PSG  Return to clinic in 6 months or sooner as needed with Dr. Drury Geralds, MD [facility] 12/20/2023 1:30 PM

## 2023-12-28 ENCOUNTER — Other Ambulatory Visit: Payer: Self-pay | Admitting: Pulmonary Disease

## 2024-01-11 DIAGNOSIS — R0602 Shortness of breath: Secondary | ICD-10-CM | POA: Diagnosis not present

## 2024-01-11 DIAGNOSIS — J4520 Mild intermittent asthma, uncomplicated: Secondary | ICD-10-CM | POA: Diagnosis not present

## 2024-01-11 DIAGNOSIS — J9601 Acute respiratory failure with hypoxia: Secondary | ICD-10-CM | POA: Diagnosis not present

## 2024-01-12 DIAGNOSIS — J4520 Mild intermittent asthma, uncomplicated: Secondary | ICD-10-CM | POA: Diagnosis not present

## 2024-01-12 DIAGNOSIS — J9601 Acute respiratory failure with hypoxia: Secondary | ICD-10-CM | POA: Diagnosis not present

## 2024-01-12 DIAGNOSIS — R0602 Shortness of breath: Secondary | ICD-10-CM | POA: Diagnosis not present

## 2024-01-14 ENCOUNTER — Other Ambulatory Visit: Payer: Self-pay | Admitting: Family Medicine

## 2024-01-14 DIAGNOSIS — E039 Hypothyroidism, unspecified: Secondary | ICD-10-CM

## 2024-01-18 LAB — HM DIABETES EYE EXAM

## 2024-01-19 ENCOUNTER — Ambulatory Visit (HOSPITAL_BASED_OUTPATIENT_CLINIC_OR_DEPARTMENT_OTHER): Attending: Primary Care | Admitting: Internal Medicine

## 2024-01-19 DIAGNOSIS — J9601 Acute respiratory failure with hypoxia: Secondary | ICD-10-CM

## 2024-01-19 DIAGNOSIS — R0683 Snoring: Secondary | ICD-10-CM | POA: Diagnosis not present

## 2024-01-19 DIAGNOSIS — G4719 Other hypersomnia: Secondary | ICD-10-CM | POA: Diagnosis not present

## 2024-01-19 DIAGNOSIS — Z79899 Other long term (current) drug therapy: Secondary | ICD-10-CM | POA: Diagnosis not present

## 2024-01-19 DIAGNOSIS — J9602 Acute respiratory failure with hypercapnia: Secondary | ICD-10-CM | POA: Diagnosis not present

## 2024-01-22 ENCOUNTER — Encounter: Payer: Self-pay | Admitting: Family Medicine

## 2024-01-22 ENCOUNTER — Ambulatory Visit (INDEPENDENT_AMBULATORY_CARE_PROVIDER_SITE_OTHER): Admitting: Family Medicine

## 2024-01-22 VITALS — BP 125/60 | HR 60 | Temp 97.5°F | Ht 64.0 in | Wt 337.0 lb

## 2024-01-22 DIAGNOSIS — E756 Lipid storage disorder, unspecified: Secondary | ICD-10-CM | POA: Diagnosis not present

## 2024-01-22 DIAGNOSIS — E7800 Pure hypercholesterolemia, unspecified: Secondary | ICD-10-CM

## 2024-01-22 DIAGNOSIS — E1169 Type 2 diabetes mellitus with other specified complication: Secondary | ICD-10-CM

## 2024-01-22 DIAGNOSIS — F331 Major depressive disorder, recurrent, moderate: Secondary | ICD-10-CM

## 2024-01-22 DIAGNOSIS — M7989 Other specified soft tissue disorders: Secondary | ICD-10-CM | POA: Diagnosis not present

## 2024-01-22 DIAGNOSIS — F5101 Primary insomnia: Secondary | ICD-10-CM

## 2024-01-22 MED ORDER — TRAZODONE HCL 100 MG PO TABS: ORAL_TABLET | ORAL | 0 refills | Status: DC

## 2024-01-22 MED ORDER — FUROSEMIDE 40 MG PO TABS: 40.0000 mg | ORAL_TABLET | Freq: Every day | ORAL | 0 refills | Status: AC

## 2024-01-22 MED ORDER — SERTRALINE HCL 50 MG PO TABS: ORAL_TABLET | ORAL | 0 refills | Status: DC

## 2024-01-22 MED ORDER — ROSUVASTATIN CALCIUM 40 MG PO TABS: 40.0000 mg | ORAL_TABLET | Freq: Every day | ORAL | 0 refills | Status: DC

## 2024-01-22 MED ORDER — TIRZEPATIDE 2.5 MG/0.5ML ~~LOC~~ SOAJ
2.5000 mg | SUBCUTANEOUS | 1 refills | Status: DC
Start: 2024-01-22 — End: 2024-02-26

## 2024-01-22 NOTE — Progress Notes (Addendum)
 Subjective:  Patient ID: [name],  female    DOB: Jun 16, 1961  Age: 63 y.o.    CC: Medical Management of Chronic Issues (Cardiology wants BMP drawn)   HPI [name] presents for  follow-up of hypertension. Patient has no history of headache chest pain or shortness of breath or recent cough. Patient also denies symptoms of TIA such as numbness weakness lateralizing. Patient denies side effects from medication. States taking it regularly.  Patient also  in for follow-up of elevated cholesterol. Doing well without complaints on current medication. Denies side effects  including myalgia and arthralgia and nausea. Also in today for liver function testing. Currently no chest pain, shortness of breath or other cardiovascular related symptoms noted.  Follow-up of diabetes. Patient does check blood sugar at home. Patient denies symptoms such as excessive hunger or urinary frequency, excessive hunger, nausea No significant hypoglycemic spells noted. Medications reviewed. Pt reports taking them regularly. Pt. denies complication/adverse reaction today.      01/22/2024    1:21 PM 10/23/2023    2:00 PM 07/24/2023    3:19 PM 07/24/2023    3:14 PM 07/20/2023   12:43 PM  Depression screen PHQ 2/9  Decreased Interest 1 1 1  0 1  Down, Depressed, Hopeless 1 1 0 0 1  PHQ - 2 Score 2 2 1  0 2  Altered sleeping 1 0 1  1  Tired, decreased energy 2 1 1  1   Change in appetite 1 1 1   0  Feeling bad or failure about yourself  1 0 0  0  Trouble concentrating 0 0 1  0  Moving slowly or fidgety/restless 0 0 0  0  Suicidal thoughts 0 0 0  0  PHQ-9 Score 7 4 5  4   Difficult doing work/chores Somewhat difficult Not difficult at all Not difficult at all      History [name] has a past medical history of Anemia, Aneurysmal bone cyst, 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   She has a past surgical history that includes Endometrial ablation w/ novasure; Carpal tunnel release (07/25/2005); trigger finger release surgeyr ; Tubal ligation; Knee arthroscopy; Knee arthroscopy with medial menisectomy (Right, 10/22/2013); Back surgery (07/25/2010); Colonoscopy; Excision metacarpal mass (Right, 03/10/2014); Total knee arthroplasty (Left, 04/16/2014); Irrigation and debridement knee (Left, 05/23/2014); Total knee arthroplasty (Right, 12/23/2014); Excisional total knee arthroplasty with antibiotic spacers (Left, 09/19/2019); Joint replacement; and Spine surgery.   Her family history includes Alcohol  abuse in her paternal grandfather; Arthritis in her mother; Breast cancer in her maternal grandmother; Cancer in her brother, daughter, and maternal grandmother; Gout in her father; Heart attack in her maternal grandfather; Heart disease in her mother; Heart disease (age of onset: 72) in her father; Hyperlipidemia in her father; Hypertension in her father and sister; Tuberculosis in her paternal grandfather.She reports that she has never smoked. She has never used smokeless tobacco. She reports that she does not drink alcohol  and does not use drugs.  Current Outpatient Medications on File Prior to Visit  Medication Sig Dispense Refill   albuterol  (VENTOLIN  HFA) 108 (90 Base) MCG/ACT inhaler Inhale 1-2 puffs into  the lungs every 6 (six) hours as needed for wheezing or shortness of breath. 1 each 11   benzonatate  (TESSALON ) 200 MG capsule TAKE 1 CAPSULE (200 MG TOTAL) BY MOUTH 3 (THREE) TIMES DAILY AS NEEDED FOR COUGH. 90  capsule 3   bisoprolol  (ZEBETA ) 5 MG tablet Take 0.5 tablets (2.5 mg total) by mouth daily. 45 tablet 3   budeson-glycopyrrolate -formoterol  (BREZTRI  AEROSPHERE) 160-9-4.8 MCG/ACT AERO Inhale 2 puffs into the lungs in the morning and at bedtime. 1 each 12   fluocinolone  (SYNALAR ) 0.01 % external solution Dribble 2-4 drops in affected ear 3 times a day until sx clear 60 mL 2   fluocinonide  cream (LIDEX ) 0.05 % Apply 1 Application topically 2 (two) times daily. 30 g 5   gabapentin  (NEURONTIN ) 300 MG capsule Take 1 capsule by mouth 2 (two) times daily.     Glucose Blood (BLOOD GLUCOSE TEST STRIPS) STRP Check BS in the morning, at noon and at bedtime Dx R73.03 300 strip 3   Lancets (ONETOUCH DELICA PLUS LANCET33G) MISC Check BS in morning, at noon and at bedtime Dx R73.03 300 each 3   levothyroxine  (SYNTHROID ) 100 MCG tablet TAKE 1 TABLET BY MOUTH EVERY DAY BEFORE BREAKFAST 90 tablet 2   Mepolizumab  (NUCALA ) 100 MG/ML SOAJ Inject 1 mL (100 mg total) into the skin every 28 (twenty-eight) days. Faxed w patient assistance application 1 mL 5   metFORMIN  (GLUCOPHAGE -XR) 500 MG 24 hr tablet Take 1 tablet (500 mg total) by mouth daily with breakfast. 90 tablet 3   methocarbamol  (ROBAXIN ) 500 MG tablet Take 500 mg by mouth every 6 (six) hours as needed for muscle spasms.     montelukast  (SINGULAIR ) 10 MG tablet TAKE 1 TABLET BY MOUTH EVERYDAY AT BEDTIME 90 tablet 1   promethazine  (PHENERGAN ) 25 MG tablet Take 1 tablet (25 mg total) by mouth every 6 (six) hours as needed for nausea or vomiting. 90 tablet 5   No current facility-administered medications on file prior to visit.    ROS Review of Systems  Constitutional: Negative.   HENT:  Negative for congestion.   Eyes:  Negative for visual disturbance.  Respiratory:  Negative for shortness of breath.   Cardiovascular:  Negative for chest pain.  Gastrointestinal:  Negative for abdominal pain, constipation, diarrhea, nausea and vomiting.  Genitourinary:   Negative for difficulty urinating.  Musculoskeletal:  Negative for arthralgias and myalgias.  Neurological:  Negative for headaches.  Psychiatric/Behavioral:  Negative for sleep disturbance.     Objective:  BP 125/60   Pulse 60   Temp (!) 97.5 F (36.4 C)   Ht 5' 4 (1.626 m)   Wt (!) 337 lb (152.9 kg)   SpO2 92%   BMI 57.85 kg/m   BP Readings from Last 3 Encounters:  01/22/24 125/60  12/20/23 130/78  12/07/23 122/82    Wt Readings from Last 3 Encounters:  01/22/24 (!) 337 lb (152.9 kg)  01/19/24 (!) 332 lb (150.6 kg)  12/20/23 (!) 332 lb 3.2 oz (150.7 kg)    Lab Results  Component Value Date   HGBA1C 6.6 (H) 11/10/2023   HGBA1C 6.9 (H) 10/23/2023   HGBA1C 6.0 (H) 07/24/2023    Physical Exam Constitutional:      General: She is not in acute distress.    Appearance: She is well-developed.  Cardiovascular:     Rate and Rhythm: Normal rate and regular rhythm.  Pulmonary:     Breath sounds: Normal breath sounds.  Musculoskeletal:  General: Normal range of motion.  Skin:    General: Skin is warm and dry.  Neurological:     Mental Status: She is alert and oriented to person, place, and time.         Assessment & Plan:  Bilateral swelling of feet -     Furosemide ; Take 1 tablet (40 mg total) by mouth daily.  Dispense: 90 tablet; Refill: 0  Diabetic lipidosis (HCC) -     Rosuvastatin  Calcium ; Take 1 tablet (40 mg total) by mouth daily.  Dispense: 90 tablet; Refill: 0 -     BMP8+EGFR  High cholesterol -     Rosuvastatin  Calcium ; Take 1 tablet (40 mg total) by mouth daily.  Dispense: 90 tablet; Refill: 0  Moderate episode of recurrent major depressive disorder (HCC) -     Sertraline  HCl; TAKE 1 TABLET BY MOUTH EVERYDAY AT BEDTIME  Dispense: 90 tablet; Refill: 0  Primary insomnia -     traZODone  HCl; TAKE 3 TABLETS (300MG ) BY MOUTH EVERY DAY AT BEDTIME  Dispense: 270 tablet; Refill: 0  Other orders -     Tirzepatide ; Inject 2.5 mg into the skin  once a week.  Dispense: 2 mL; Refill: 1    Follow-up: Return in about 1 month (around 02/21/2024).  Butler Der, M.D.

## 2024-01-23 ENCOUNTER — Ambulatory Visit: Payer: Self-pay | Admitting: Family Medicine

## 2024-01-23 ENCOUNTER — Encounter: Payer: Self-pay | Admitting: Family Medicine

## 2024-01-23 LAB — BMP8+EGFR
BUN/Creatinine Ratio: 20 (ref 12–28)
BUN: 19 mg/dL (ref 8–27)
CO2: 23 mmol/L (ref 20–29)
Calcium: 9.5 mg/dL (ref 8.7–10.3)
Chloride: 101 mmol/L (ref 96–106)
Creatinine, Ser: 0.93 mg/dL (ref 0.57–1.00)
Glucose: 98 mg/dL (ref 70–99)
Potassium: 5.1 mmol/L (ref 3.5–5.2)
Sodium: 139 mmol/L (ref 134–144)
eGFR: 70 mL/min/{1.73_m2} (ref >59)

## 2024-01-23 NOTE — Progress Notes (Signed)
 Hello Kynsli,  Your lab result is normal and/or stable.Some minor variations that are not significant are commonly marked abnormal, but do not represent any medical problem for you.  Best regards, [name], M.D.

## 2024-02-04 NOTE — Procedures (Signed)
 [facility] [address] [place], [place] [address] Tel: [phone]   Fax: [phone]  Polysomnography Interpretation  Patient Name:  Morgan, [name] Date:  01/19/2024 Referring Physician:  Hope Almarie ORN, NP  Indications for Polysomnography The patient is a 63 year old Female who is 5' 4 and weighs 332.0 lbs. Her BMI equals 57.4.  A full night polysomnogram was performed to evaluate for -.OSA  Medication was taken at 9 pm  Trazadone  Gabapentin    Polysomnogram Data A full night polysomnogram recorded the standard physiologic parameters including EEG, EOG, EMG, EKG, nasal and oral airflow.  Respiratory parameters of chest and abdominal movements were recorded with Respiratory Inductance Plethysmography belts.  Oxygen saturation was recorded by pulse oximetry.   Sleep Architecture The total recording time of the polysomnogram was 414.6 minutes.  The total sleep time was 250.5 minutes.  The patient spent 3.4% of total sleep time in Stage N1, 30.9% in Stage N2, 65.7% in Stages N3, and 0.0% in REM.  Sleep latency was 55.1 minutes.  REM latency was - minutes.  Sleep Efficiency was 60.4%.  Wake after Sleep Onset time was 109.0 minutes.  Respiratory Events The polysomnogram revealed a presence of - obstructive, - central, and - mixed apneas resulting in an Apnea index of - events per hour.  There were 11 hypopneas (>=3% desaturation and/or arousal) resulting in an Apnea\Hypopnea Index (AHI >=3% desaturation and/or arousal) of 2.6 events per hour.  There were 4 hypopneas (>=4% desaturation) resulting in an Apnea\Hypopnea Index (AHI >=4% desaturation) of 1.0 events per hour.  There were 11 Respiratory Effort Related Arousals resulting in a RERA index of 2.6 events per hour. The Respiratory Disturbance Index is 5.3 events per hour.  The snore index was - events per hour.  Mean oxygen saturation was 92.3%.  The lowest oxygen saturation  while in bed was 83%.  Time spent <=88% oxygen saturation was 25.9 minutes (6.4%). Supplemental O2 2L added per protocol.  Limb Activity There were - total limb movements recorded, of this total, - were classified as PLMs.  PLM index was - per hour and PLM associated with Arousals index was - per hour.  Cardiac Summary The average pulse rate was 73.7 bpm.  The minimum pulse rate was 52.0 bpm while the maximum pulse rate was 92.0 bpm.  Cardiac rhythm was normal.  Comment: Insufficient early events and sleep to meet protocol requirement for split CPAP titration. Supplemental O2 2L added per protocol at 11:34 PM for saturation </= 88%. Occasional apneas and hypopneas, within normal limits, AHI (3%) 2.6/hr. Mild to very loud snoring with oxygen desaturation (before O2 added) to a nadir of 83%, mean (on O2 2L) 92.3%.  Diagnosis: Nocturnal Hypoxemia, Primary snoring  Recommendations: Supplemental oxygen. Encourage sleep position of back.   This study was personally reviewed and electronically signed by: Neysa Reggy BIRCH, MD Accredited Board Certified in Sleep Medicine Date/Time: 02/04/24  1:23    Diagnostic PSG Report  Patient Name: [name], [name] Date: 01/19/2024  Date of Birth: 19-Dec-1960 Study Type: Diagnostic  Age: 48 year MRN #: [id number]  Sex: Female Interpreting Physician: NEYSA REGGY, [id number]  Height: 5' 4 Referring Physician: Hope Almarie ORN, NP  Weight: 332.0 lbs Recording Tech: Hargis Abu RPSGT RST  BMI: 57.4 Scoring Tech: Hargis Abu RPSGT RST  ESS: 1 Neck Size: 23  Mask Type Simplus FFM    Mask Size: Medium Supplemental O2: 2 LPM  Study Overview  Lights Off: 10:07:31 PM  Count Index  Lights On: 05:02:09 AM Awakenings: 13 3.1  Time in Bed: 414.6 min. Arousals: 41 9.8  Total Sleep Time: 250.5 min. AHI (>=3% Desat and/or Ar.): 11 2.6   Sleep Efficiency: 60.4% AHI (>=4% Desat): 4 1.0   Sleep Latency: 55.1 min. Limb Movements: - -  Wake After Sleep Onset: 109.0  min. Snore: - -  REM Latency from Sleep Onset: - min. Desaturations: 16 3.8     Minimum SpO2 TST: 90.0%    Sleep Architecture  % of Time in Bed Stages Time (mins) % Sleep Time  Wake 164.5   Stage N1 8.5 3.4%  Stage N2 77.5 30.9%  Stage N3 164.5 65.7%  REM 0.0 0.0%   Arousal Summary   NREM REM Sleep Index  Respiratory Arousals 11 - 11 2.6  PLM Arousals - - - -  Isolated Limb Movement Arousals - - - -  Snore Arousals - - - -  Spontaneous Arousals 30 - 30 7.2  Total 41 - 41 9.8   Limb Movement Summary   Count Index  Isolated Limb Movements - -  Periodic Limb Movements (PLMs) - -  Total Limb Movements - -    Respiratory Summary   By Sleep Stage By Body Position Total   NREM REM Supine Non-Supine   Time (min) 250.5 0.0 4.0 246.5 250.5         Obstructive Apnea - - - - -  Mixed Apnea - - - - -  Central Apnea - - - - -  Total Apneas - - - - -  Total Apnea Index - - - - -         Hypopneas (>=3% Desat and/or Ar.) 11 - - 11 11  AHI (>=3% Desat and/or Ar.) 2.6 - - 2.7 2.6         Hypopneas (>=4% Desat) 4 - - 4 4  AHI (>=4% Desat) 1.0 - - 1.0 1.0          RERAs 11 - - 11 11  RERA Index 2.6 - - 2.7 2.6         RDI 5.3 - - 5.4 5.3    Respiratory Event Type Index  Central Apneas -  Obstructive Apneas -  Mixed Apneas -  Central Hypopneas -  Obstructive Hypopneas -  Central Apnea + Hypopnea (CAHI) -  Obstructive Apnea + Hypopnea (OAHI) 3.6   Respiratory Event Durations   Apnea Hypopnea   NREM REM NREM REM  Average (seconds) - - 18.5 -  Maximum (seconds) - - 31.3 -    Oxygen Saturation Summary   Wake NREM REM TST TIB  Average SpO2 (%) 91.8% 92.6% - 92.6% 92.3%  Minimum SpO2 (%) 83.0% 90.0% - 90.0% 83.0%  Maximum SpO2 (%) 96.0% 96.0% - 96.0% 96.0%   Oxygen Saturation Distribution  Range (%) Time in range (min) Time in range (%)  90.0 - 100.0 376.3 92.7%  80.0 - 90.0 29.4 7.3%  70.0 - 80.0 - -  60.0 - 70.0 - -  50.0 - 60.0 - -  0.0 - 50.0 - -  Time  Spent <=88% SpO2  Range (%) Time in range (min) Time in range (%)  0.0 - 88.0 25.9 6.4%      Count Index  Desaturations 16 3.8    Cardiac Summary   Wake NREM REM Sleep Total  Average Pulse Rate (BPM) 71.2 75.3 - 75.3 73.7  Minimum Pulse  Rate (BPM) 52.0 64.0 - 64.0 52.0  Maximum Pulse Rate (BPM) 90.0 92.0 - 92.0 92.0   Pulse Rate Distribution:  Range (bpm) Time in range (min) Time in range (%)  0.0 - 40.0 - -  40.0 - 60.0 3.2 0.8%  60.0 - 80.0 402.7 98.2%  80.0 - 100.0 3.5 0.9%  100.0 - 120.0 - -  120.0 - 140.0 - -  140.0 - 200.0 - -   Supplemental O2 Summary  O2 Level Time (min) Wake (min) NREM (min) REM (min) Sleep Eff% OA# CA# MA# Hyp# (>=3%) AHI (>=3%) Hyp# (>=4%) AHI (>=%4) RERA RDI SpO2 <=88% (min) Min SpO2 Mean SpO2 Ar. Index  - 27.0 27.0 0.0 0.0 0.0%               O2: 2.00 388.5 137.5 250.5 0.0 64.5% - - - 11 2.6 4  1.0 11  5.3  0.0 90.0 92.6 9.8   Hypnograms                      Technologist Comments  Patient was here at Sleep lab for Excessive Daytime Sleepiness.  A Split Night Study was ordered.  Patient did not meet Split Night Study due to not enough respiratory events and sleep. Occasional respiratory events noted.  Snoring range from mild to very loud.  Periodic Limb Movement was rare.  EKG showed NSR.  Patient took her medication at 9 pm.  Patient was place on 2 LPM of oxygen at 11:34 per lab protocol for SAO2 level 88% and below. Patient wear oxygen 24/7 at 2 LPM.  Patient had one restroom visited.                      Reggy Salt Diplomate, Biomedical engineer of Sleep Medicine  ELECTRONICALLY SIGNED ON:  02/04/2024, 1:12 PM [facility] PH: [phone]   FX: [phone] ACCREDITED BY THE [employer]ICAN ACADEMY OF SLEEP MEDICINE

## 2024-02-10 DIAGNOSIS — J4520 Mild intermittent asthma, uncomplicated: Secondary | ICD-10-CM | POA: Diagnosis not present

## 2024-02-10 DIAGNOSIS — J9601 Acute respiratory failure with hypoxia: Secondary | ICD-10-CM | POA: Diagnosis not present

## 2024-02-10 DIAGNOSIS — R0602 Shortness of breath: Secondary | ICD-10-CM | POA: Diagnosis not present

## 2024-02-11 DIAGNOSIS — R0602 Shortness of breath: Secondary | ICD-10-CM | POA: Diagnosis not present

## 2024-02-11 DIAGNOSIS — J9601 Acute respiratory failure with hypoxia: Secondary | ICD-10-CM | POA: Diagnosis not present

## 2024-02-11 DIAGNOSIS — J4520 Mild intermittent asthma, uncomplicated: Secondary | ICD-10-CM | POA: Diagnosis not present

## 2024-02-20 ENCOUNTER — Other Ambulatory Visit: Payer: Self-pay | Admitting: Nurse Practitioner

## 2024-02-20 DIAGNOSIS — Z1231 Encounter for screening mammogram for malignant neoplasm of breast: Secondary | ICD-10-CM

## 2024-02-21 ENCOUNTER — Telehealth: Payer: Self-pay | Admitting: Primary Care

## 2024-02-21 ENCOUNTER — Ambulatory Visit
Admission: RE | Admit: 2024-02-21 | Discharge: 2024-02-21 | Disposition: A | Discharge status: Home / Self Care | Source: Ambulatory Visit | Attending: Family Medicine | Admitting: Family Medicine

## 2024-02-21 DIAGNOSIS — Z1231 Encounter for screening mammogram for malignant neoplasm of breast: Secondary | ICD-10-CM | POA: Diagnosis not present

## 2024-02-21 NOTE — Telephone Encounter (Signed)
 Please schedule follow up to review sleep study results

## 2024-02-22 NOTE — Telephone Encounter (Signed)
 PT has been scheduled.

## 2024-02-26 ENCOUNTER — Ambulatory Visit: Admitting: Family Medicine

## 2024-02-26 VITALS — BP 131/71 | HR 60 | Temp 97.6°F | Ht 64.0 in | Wt 334.6 lb

## 2024-02-26 DIAGNOSIS — E1169 Type 2 diabetes mellitus with other specified complication: Secondary | ICD-10-CM

## 2024-02-26 DIAGNOSIS — E756 Lipid storage disorder, unspecified: Secondary | ICD-10-CM | POA: Diagnosis not present

## 2024-02-26 DIAGNOSIS — I10 Essential (primary) hypertension: Secondary | ICD-10-CM | POA: Diagnosis not present

## 2024-02-26 DIAGNOSIS — E039 Hypothyroidism, unspecified: Secondary | ICD-10-CM | POA: Diagnosis not present

## 2024-02-26 DIAGNOSIS — E119 Type 2 diabetes mellitus without complications: Secondary | ICD-10-CM | POA: Diagnosis not present

## 2024-02-26 LAB — LIPID PANEL

## 2024-02-26 LAB — BAYER DCA HB A1C WAIVED: HB A1C (BAYER DCA - WAIVED): 6.1 % — ABNORMAL HIGH (ref 4.8–5.6)

## 2024-02-26 MED ORDER — TIRZEPATIDE 5 MG/0.5ML ~~LOC~~ SOAJ: 5.0000 mg | SUBCUTANEOUS | 0 refills | Status: DC

## 2024-02-26 MED ORDER — LINACLOTIDE 145 MCG PO CAPS: 145.0000 ug | ORAL_CAPSULE | Freq: Every day | ORAL | 5 refills | Status: AC

## 2024-02-26 NOTE — Progress Notes (Signed)
 Subjective:  Patient ID: [name], female    DOB: 04/15/1961  Age: 63 y.o. MRN: [id number]  CC: Follow-up   HPI KHYA HALLS presents for Follow-up of diabetes. Patient does not check blood sugar at home Patient denies symptoms such as polyuria, polydipsia, excessive hunger, nausea No significant hypoglycemic spells noted. Medications as noted below. Taking them regularly without complication/adverse reaction being reported today.  Patient presents for follow-up on  thyroid . The patient has a history of hypothyroidism for many years. It has been stable recently. Pt. denies any change in  voice, loss of hair, heat or cold intolerance. Energy level has been adequate to good. Patient denies constipation and diarrhea. No myxedema. Medication is as noted below. Verified that pt is taking it daily on an empty stomach. Well tolerated.   follow-up of hypertension. Patient has no history of headache chest pain or shortness of breath or recent cough. Patient also denies symptoms of TIA such as numbness weakness lateralizing. Patient checks  blood pressure at home and has not had any elevated readings recently. Patient denies side effects from his medication. States taking it regularly.      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   She has a past surgical history that includes Endometrial ablation w/ novasure; Carpal tunnel release (07/25/2005); trigger finger release surgeyr ; Tubal ligation; Knee arthroscopy; Knee arthroscopy with medial menisectomy (Right, 10/22/2013); Back surgery (07/25/2010); Colonoscopy; Excision metacarpal mass (Right, 03/10/2014); Total knee arthroplasty (Left, 04/16/2014); Irrigation and debridement knee (Left, 05/23/2014); Total knee arthroplasty (Right, 12/23/2014); Excisional total knee arthroplasty with antibiotic spacers (Left, 09/19/2019); Joint replacement; and Spine surgery.   Her family history includes Alcohol  abuse in her paternal grandfather; Arthritis in her mother; Breast cancer in her maternal grandmother; Cancer in her brother, daughter, and maternal grandmother; Gout in her father; Heart attack in her maternal grandfather; Heart disease in her mother; Heart disease (age of onset: 10) in her father; Hyperlipidemia in her father; Hypertension in her father and sister; Tuberculosis in her paternal grandfather.She reports that she has never smoked. She has never used smokeless tobacco. She reports that she does not drink alcohol  and does not use drugs.    ROS Review of Systems  Constitutional: Negative.   HENT: Negative.    Eyes:  Negative for visual disturbance.  Respiratory:  Negative for shortness  of breath.   Cardiovascular:  Negative for chest pain.  Gastrointestinal:  Negative for abdominal pain.   Musculoskeletal:  Negative for arthralgias.    Objective:  BP 131/71   Pulse 60   Temp 97.6 F (36.4 C)   Ht 5' 4 (1.626 m)   Wt (!) 334 lb 9.6 oz (151.8 kg)   SpO2 (!) 88%   BMI 57.43 kg/m   BP Readings from Last 3 Encounters:  02/26/24 131/71  01/22/24 125/60  12/20/23 130/78    Wt Readings from Last 3 Encounters:  02/26/24 (!) 334 lb 9.6 oz (151.8 kg)  01/22/24 (!) 337 lb (152.9 kg)  01/19/24 (!) 332 lb (150.6 kg)     Physical Exam Constitutional:      General: She is not in acute distress.    Appearance: She is well-developed.  HENT:     Head: Normocephalic and atraumatic.     Right Ear: External ear normal.     Left Ear: External ear normal.     Nose: Nose normal.  Eyes:     Conjunctiva/sclera: Conjunctivae normal.     Pupils: Pupils are equal, round, and reactive to light.  Neck:     Thyroid : No thyromegaly.  Cardiovascular:     Rate and Rhythm: Normal rate and regular rhythm.     Heart sounds: Normal heart sounds. No murmur heard. Pulmonary:     Effort: Pulmonary effort is normal. No respiratory distress.     Breath sounds: Normal breath sounds. No wheezing or rales.  Abdominal:     General: Bowel sounds are normal. There is no distension.     Palpations: Abdomen is soft.     Tenderness: There is no abdominal tenderness.  Musculoskeletal:        General: Normal range of motion.     Cervical back: Normal range of motion and neck supple.  Lymphadenopathy:     Cervical: No cervical adenopathy.  Skin:    General: Skin is warm and dry.  Neurological:     Mental Status: She is alert and oriented to person, place, and time.     Deep Tendon Reflexes: Reflexes are normal and symmetric.  Psychiatric:        Behavior: Behavior normal.        Thought Content: Thought content normal.        Judgment: Judgment normal.      Assessment & Plan:  Essential hypertension, benign -     CMP14+EGFR  Hypothyroidism, unspecified type -     CMP14+EGFR -      TSH + free T4  Diabetic lipidosis (HCC) -     Bayer DCA Hb A1c Waived -     CBC with Differential/Platelet -     CMP14+EGFR -     Lipid panel -     Microalbumin / creatinine urine ratio -     Tirzepatide ; Inject 5 mg into the skin once a week.  Dispense: 6 mL; Refill: 0  Other orders -     linaCLOtide ; Take 1 capsule (145 mcg total) by mouth daily. To regulate bowel movements  Dispense: 30 capsule; Refill: 5     Follow-up: Return in about 3 months (around 05/28/2024).  Butler Der, M.D.

## 2024-02-27 ENCOUNTER — Ambulatory Visit: Payer: Self-pay | Admitting: Family Medicine

## 2024-02-27 LAB — TSH+FREE T4
Free T4: 1.45 ng/dL (ref 0.82–1.77)
TSH: 1.34 u[IU]/mL (ref 0.450–4.500)

## 2024-02-27 LAB — CBC WITH DIFFERENTIAL/PLATELET
Basophils Absolute: 0 x10E3/uL (ref 0.0–0.2)
Basos: 0 %
EOS (ABSOLUTE): 0.1 x10E3/uL (ref 0.0–0.4)
Eos: 1 %
Hematocrit: 41 % (ref 34.0–46.6)
Hemoglobin: 12.8 g/dL (ref 11.1–15.9)
Immature Grans (Abs): 0 x10E3/uL (ref 0.0–0.1)
Immature Granulocytes: 0 %
Lymphocytes Absolute: 1.5 x10E3/uL (ref 0.7–3.1)
Lymphs: 21 %
MCH: 30.5 pg (ref 26.6–33.0)
MCHC: 31.2 g/dL — ABNORMAL LOW (ref 31.5–35.7)
MCV: 98 fL — ABNORMAL HIGH (ref 79–97)
Monocytes Absolute: 0.5 x10E3/uL (ref 0.1–0.9)
Monocytes: 6 %
Neutrophils Absolute: 5.1 x10E3/uL (ref 1.4–7.0)
Neutrophils: 72 %
Platelets: 187 x10E3/uL (ref 150–450)
RBC: 4.2 x10E6/uL (ref 3.77–5.28)
RDW: 13.3 % (ref 11.7–15.4)
WBC: 7.2 x10E3/uL (ref 3.4–10.8)

## 2024-02-27 LAB — CMP14+EGFR
ALT: 22 IU/L (ref 0–32)
AST: 22 IU/L (ref 0–40)
Albumin: 4.2 g/dL (ref 3.8–4.9)
Alkaline Phosphatase: 129 IU/L — AB (ref 44–121)
BUN/Creatinine Ratio: 16 (ref 12–28)
BUN: 18 mg/dL (ref 8–27)
Bilirubin Total: 0.3 mg/dL (ref 0.0–1.2)
CO2: 24 mmol/L (ref 20–29)
Calcium: 9.6 mg/dL (ref 8.7–10.3)
Chloride: 104 mmol/L (ref 96–106)
Creatinine, Ser: 1.13 mg/dL — AB (ref 0.57–1.00)
Globulin, Total: 2.7 g/dL (ref 1.5–4.5)
Glucose: 153 mg/dL — AB (ref 70–99)
Potassium: 4.7 mmol/L (ref 3.5–5.2)
Sodium: 144 mmol/L (ref 134–144)
Total Protein: 6.9 g/dL (ref 6.0–8.5)
eGFR: 56 mL/min/1.73 — AB (ref >59)

## 2024-02-27 LAB — LIPID PANEL
Cholesterol, Total: 120 mg/dL (ref 100–199)
HDL: 30 mg/dL — AB (ref >39)
LDL CALC COMMENT:: 4 ratio (ref 0.0–4.4)
LDL Chol Calc (NIH): 58 mg/dL (ref 0–99)
Triglycerides: 190 mg/dL — AB (ref 0–149)
VLDL Cholesterol Cal: 32 mg/dL (ref 5–40)

## 2024-02-27 NOTE — Progress Notes (Signed)
 Hello [name],  Your lab result is normal and/or stable.Some minor variations that are not significant are commonly marked abnormal, but do not represent any medical problem for you.  Best regards, [name], M.D.

## 2024-03-02 ENCOUNTER — Encounter: Payer: Self-pay | Admitting: Family Medicine

## 2024-03-12 DIAGNOSIS — J9601 Acute respiratory failure with hypoxia: Secondary | ICD-10-CM | POA: Diagnosis not present

## 2024-03-12 DIAGNOSIS — R0602 Shortness of breath: Secondary | ICD-10-CM | POA: Diagnosis not present

## 2024-03-12 DIAGNOSIS — J4520 Mild intermittent asthma, uncomplicated: Secondary | ICD-10-CM | POA: Diagnosis not present

## 2024-03-13 DIAGNOSIS — R0602 Shortness of breath: Secondary | ICD-10-CM | POA: Diagnosis not present

## 2024-03-13 DIAGNOSIS — J9601 Acute respiratory failure with hypoxia: Secondary | ICD-10-CM | POA: Diagnosis not present

## 2024-03-13 DIAGNOSIS — J4520 Mild intermittent asthma, uncomplicated: Secondary | ICD-10-CM | POA: Diagnosis not present

## 2024-03-15 ENCOUNTER — Other Ambulatory Visit: Payer: Self-pay | Admitting: Family Medicine

## 2024-03-17 ENCOUNTER — Other Ambulatory Visit: Payer: Self-pay | Admitting: Family Medicine

## 2024-03-18 ENCOUNTER — Other Ambulatory Visit: Payer: Self-pay | Admitting: Family Medicine

## 2024-03-18 ENCOUNTER — Ambulatory Visit: Admitting: Adult Health

## 2024-03-18 ENCOUNTER — Encounter: Payer: Self-pay | Admitting: Adult Health

## 2024-03-18 VITALS — BP 114/72 | HR 66 | Ht 64.0 in | Wt 328.5 lb

## 2024-03-18 DIAGNOSIS — Z01419 Encounter for gynecological examination (general) (routine) without abnormal findings: Secondary | ICD-10-CM

## 2024-03-18 DIAGNOSIS — Z1211 Encounter for screening for malignant neoplasm of colon: Secondary | ICD-10-CM | POA: Diagnosis not present

## 2024-03-18 LAB — HEMOCCULT GUIAC POC 1CARD (OFFICE): Fecal Occult Blood, POC: NEGATIVE

## 2024-03-18 NOTE — Progress Notes (Signed)
 Patient ID: [name], female   DOB: 1961-06-10, 63 y.o.   MRN: [id number] History of Present Illness: [name] is a 63 year old white female, married, PM in for a well woman gyn exam. She had SVT in May and was in hospital, has seen Dr Lysbeth. Lost 29 lbs since May on Mounjaro .      Component Value Date/Time   DIAGPAP  03/08/2023 1410    - Negative for intraepithelial lesion or malignancy (NILM)   HPVHIGH Negative 03/08/2023 1410   ADEQPAP  03/08/2023 1410    Satisfactory for evaluation; transformation zone component ABSENT.   PCP is Dr Zollie   Current Medications, Allergies, Past Medical History, Past Surgical History, Family History and Social History were reviewed in [employer] [facility] electronic medical record.     Review of Systems: Patient denies any headaches, hearing loss, fatigue, blurred vision, shortness of breath, chest pain, abdominal pain, problems with bowel movements(on linzess , and when has to go, better go) urination, or intercourse. No joint pain or mood swings.  Has pain in left shoulder blade area, with palpation(try heat and ice and sitting in recliner with feet up, not over arm of chair).   Physical Exam:BP 114/72 (BP Location: Left Arm, Patient Position: Sitting, Cuff Size: Large)   Pulse 66   Ht 5' 4 (1.626 m)   Wt (!) 328 lb 8 oz (149 kg)   BMI 56.39 kg/m   General:  Well developed, well nourished, no acute distress Skin:  Warm and dry Neck:  Midline trachea, normal thyroid , good ROM, no lymphadenopathy Lungs; Clear to auscultation bilaterally Breast:  No dominant palpable mass, retraction, or nipple discharge Cardiovascular: Regular rate and rhythm Abdomen:  Soft, non tender, no hepatosplenomegaly,obese, has point tenderness left shoulder blade Pelvic:  External genitalia is normal in appearance, no lesions.  The vagina is normal in appearance. Urethra has no lesions or masses. The cervix is smooth.  Uterus is felt to be normal size, shape, and contour.   No adnexal masses or tenderness noted.Bladder is non tender, no masses felt. Rectal: Good sphincter tone, no polyps, or hemorrhoids felt.  Hemoccult negative. Extremities/musculoskeletal:  No swelling or varicosities noted, no clubbing or cyanosis Psych:  No mood changes, alert and cooperative,seems happy AA is 0 Fall risk is low    03/18/2024    1:35 PM 02/26/2024    1:18 PM 01/22/2024    1:21 PM  Depression screen PHQ 2/9  Decreased Interest 0 1 1  Down, Depressed, Hopeless 1 1 1   PHQ - 2 Score 1 2 2   Altered sleeping 0 0 1  Tired, decreased energy 1 1 2   Change in appetite 0 0 1  Feeling bad or failure about yourself  0 1 1  Trouble concentrating 0 0 0  Moving slowly or fidgety/restless 0 0 0  Suicidal thoughts 0 0 0  PHQ-9 Score 2 4 7   Difficult doing work/chores  Not difficult at all Somewhat difficult       03/18/2024    1:35 PM 02/26/2024    1:19 PM 01/22/2024    1:21 PM 10/23/2023    2:00 PM  GAD 7 : Generalized Anxiety Score  Nervous, Anxious, on Edge 1 1 1  0  Control/stop worrying 0 0 0 0  Worry too much - different things 0 0 0 0  Trouble relaxing 1 1 1 1   Restless 0 0 0 0  Easily annoyed or irritable 1 1 2 1   Afraid - awful might  happen 0 0 0 0  Total GAD 7 Score 3 3 4 2   Anxiety Difficulty  Not difficult at all Not difficult at all Not difficult at all    Upstream - 03/18/24 1330       Pregnancy Intention Screening   Does the patient want to become pregnant in the next year? No    Does the patient's partner want to become pregnant in the next year? No    Would the patient like to discuss contraceptive options today? No      Contraception Wrap Up   Current Method Female Sterilization    End Method Female Sterilization    Contraception Counseling Provided No           Examination chaperoned by Clarita Salt LPN   Impression and plan: 1. Encounter for well woman exam with routine gynecological exam (Primary) Pap in 2027 Physical in 1 year Mammogram was  negative 02/21/24 Colonoscopy per [facility] with PCP and cardiologist   2. Encounter for screening fecal occult blood testing Hemoccult was negative  - POCT occult blood stool

## 2024-03-29 ENCOUNTER — Other Ambulatory Visit: Payer: Self-pay | Admitting: Family Medicine

## 2024-03-29 DIAGNOSIS — I10 Essential (primary) hypertension: Secondary | ICD-10-CM

## 2024-04-12 DIAGNOSIS — J4520 Mild intermittent asthma, uncomplicated: Secondary | ICD-10-CM | POA: Diagnosis not present

## 2024-04-12 DIAGNOSIS — R0602 Shortness of breath: Secondary | ICD-10-CM | POA: Diagnosis not present

## 2024-04-12 DIAGNOSIS — J9601 Acute respiratory failure with hypoxia: Secondary | ICD-10-CM | POA: Diagnosis not present

## 2024-04-13 DIAGNOSIS — J4520 Mild intermittent asthma, uncomplicated: Secondary | ICD-10-CM | POA: Diagnosis not present

## 2024-04-13 DIAGNOSIS — J9601 Acute respiratory failure with hypoxia: Secondary | ICD-10-CM | POA: Diagnosis not present

## 2024-04-13 DIAGNOSIS — R0602 Shortness of breath: Secondary | ICD-10-CM | POA: Diagnosis not present

## 2024-04-16 ENCOUNTER — Ambulatory Visit: Admitting: Primary Care

## 2024-04-16 ENCOUNTER — Encounter: Payer: Self-pay | Admitting: Primary Care

## 2024-04-16 VITALS — BP 136/80 | HR 62 | Temp 97.6°F | Ht 64.0 in | Wt 323.2 lb

## 2024-04-16 DIAGNOSIS — Z6841 Body Mass Index (BMI) 40.0 and over, adult: Secondary | ICD-10-CM

## 2024-04-16 DIAGNOSIS — J4520 Mild intermittent asthma, uncomplicated: Secondary | ICD-10-CM | POA: Diagnosis not present

## 2024-04-16 DIAGNOSIS — G4736 Sleep related hypoventilation in conditions classified elsewhere: Secondary | ICD-10-CM

## 2024-04-16 DIAGNOSIS — E669 Obesity, unspecified: Secondary | ICD-10-CM

## 2024-04-16 NOTE — Patient Instructions (Addendum)
  VISIT SUMMARY: [name], a 63 year old female with obesity hypoventilation syndrome and chronic respiratory failure, came in for a follow-up after her sleep study. The study showed an average of 2.6 events per hour, and her oxygen  saturation was 92% on two liters of supplemental oxygen . She also has a history of asthma, managed with Nucala  and Breztri , and type 2 diabetes, managed with Metformin  and Mounjaro . She has lost weight from 357 to 329 pounds and experiences occasional diarrhea. Her oxygen  saturation was 90% in the office, and she uses oxygen  at night and during exertion.  YOUR PLAN: -CHRONIC RESPIRATORY FAILURE WITH HYPOXIA AND OBESITY HYPOVENTILATION SYNDROME: Chronic respiratory failure with hypoxia and obesity hypoventilation syndrome means that your lungs are not getting enough oxygen  due to your weight. The sleep study showed no significant obstructive sleep apnea, and your oxygen  levels improved with supplemental oxygen . You should continue using supplemental oxygen  at night and during moderate exertion or when your oxygen  saturation drops below 88%. Weight loss is crucial for improving your respiratory function and potentially discontinuing oxygen  therapy.  -OBESITY: Obesity is a condition where excess body fat affects your health. You are currently on Mounjaro  5 mg, with plans to increase the dose based on how well you tolerate it. Weight loss is important for improving your respiratory function and overall health. We discussed dietary habits, emphasizing the importance of protein intake to prevent malnutrition. You should avoid heavy dairy or fried foods and focus on small meals and adequate protein intake. Continue Mounjaro  with dose adjustments as tolerated, aiming for 10-15 mg for effectiveness. Monitor your weight and adjust treatment as necessary.  -MILD INTERMITTENT ASTHMA: Mild intermittent asthma is a condition where your airways occasionally become inflamed, causing  breathing difficulties. Your asthma is well-controlled with your current medications, and you have not had any recent asthma attacks. Continue using Breztri  in the morning and evening, and continue self-administering Nucala  injections as prescribed. Use your rescue inhaler as needed.  INSTRUCTIONS: Continue using supplemental oxygen  at night and during moderate exertion or when your oxygen  saturation drops below 88%. Continue Mounjaro  with dose adjustments as tolerated, aiming for 10-15 mg for effectiveness. Monitor your weight and adjust treatment as necessary. Continue using Breztri  in the morning and evening, and continue self-administering Nucala  injections as prescribed. Use your rescue inhaler as needed.  Follow-up 6 months with either Dr. Annella or Landry NP

## 2024-04-16 NOTE — Progress Notes (Signed)
 @Patient  ID: [name], female    DOB: 01-30-1961, 63 y.o.   MRN: [id number]  Chief Complaint  Patient presents with   Medical Management of Chronic Issues    HST results    Asthma    Referring provider: Zollie Lowers, MD  HPI: 63 y.o. woman with history of asthma whom are seen for evaluation of shortness of breath and cough felt related to poorly controlled asthma with symptoms overall improved since starting Nucala  in 2024.  Most recent pulmonary note Landry Ferrari, NP 10/2022 reviewed.  Discharge summary 10/2022 reviewed.  Doing a bit better on Nucala .  Symptoms cough shortness of breath at baseline.  Using albuterol  1-2 times a week.  Not bad.  We discussed using her oxygen .  This is newly prescribed hospitalization, recent hospitalization.  Oxygen  saturation largely stayed above 90%.  With a lot of exertion dropped down to 85%.  Advised her to wear with these activities.  Advised to wear at night as well.  Has upcoming split-night study.  HPI initial visit: Seen by PCP 8/5. Maintained on singulair , trelegy and started on prednisone  taper, levaquin . She doesn't note much difference with trelegy as opposed to breo.   She says she has DOE that's been on and off, but lately it has been progressive over last 3 months. DOE to 50 ft currently. She has some accompanying chest pain that is worse with coughing. Pleuritic after a bad coughing spell. Had bronchitis about 3 weeks ago given above tx, she feels tessalon  perles were probably more helpful than prednisone . She never took the levaquin . She doesn't note any worsening orthopnea - she always feels smothered when she lies on her back. Some worsening swelling in R>L foot.   Smoked very little when she was in middle school. She works for [employer] in [place] division. 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 She has lived in [place] for a period. She has a cat. No pet bird or hot tub.  No first  degree relatives with lung disease.   Interval HPI:  PSG not scheduled, PFTs with mild restriction today. Had been off of breztri  for a month prior to PFT. Has felt much worse off of breztri . Increased DOE. Some cough but not especially bothersome, only when she first gets up.   She is using her albuterol  inhaler maybe once weekly currently.    Otherwise pertinent review of systems is negative.      04/16/2024- Interim hx  Discussed the use of AI scribe software for clinical note transcription with the patient, who gave verbal consent to proceed.  History of Present Illness [name] is a 63 year old female with obesity hypoventilation syndrome and chronic respiratory failure who presents for follow-up after a sleep study.  She underwent a sleep study in June due to her obesity hypoventilation syndrome and chronic respiratory failure which was negative for obstructive sleep apnea, revealed nocturnal hypoxemia and snoring. The study revealed an average of 2.6 events per hour. Her oxygen  saturation was 92% on two liters of supplemental oxygen .  She has a history of asthma, managed with Nucala  and Breztri . These medications are effective, with no recent asthma attacks, although she occasionally uses her rescue inhaler. Breztri  is used twice daily, and she self-administers Nucala  injections.  She also has type 2 diabetes, managed with Metformin  and Mounjaro , currently at a dose of 5 mg. She started at 2.5 mg and increased to 5 mg  after a month. She has lost weight from 357 to 329 pounds and experiences occasional diarrhea, particularly after large meals or certain foods.  Her oxygen  saturation was 90% in the office. She uses oxygen  at night and has a home concentrator and a portable machine for exertion, although she primarily stays at home.   Allergies  Allergen Reactions   Venlafaxine Other (See Comments)    Pt refuses to take makes me feel like I am dying   Phentermine       Tachycardia   Adhesive [Tape] Other (See Comments)    Peeled off skin   Ceclor [Cefaclor] Other (See Comments)    Blisters on hands   Chocolate Other (See Comments)    Migraines   Lisinopril  Cough    Immunization History  Administered Date(s) Administered   DTaP 07/25/2013   INFLUENZA, HIGH DOSE SEASONAL PF 07/07/2014, 05/14/2018, 04/02/2019   Influenza Inj Mdck Quad Pf 04/29/2021, 03/25/2022   Influenza Split 05/08/2013   Influenza, Mdck, Trivalent,PF 6+ MOS(egg free) 03/30/2023   Influenza, Quadrivalent, Recombinant, Inj, Pf 05/14/2018   Influenza, Seasonal, Injecte, Preservative Fre 07/07/2014   Influenza,inj,Quad PF,6+ Mos 07/07/2014, 07/07/2014, 05/14/2018, 04/02/2019, 03/31/2020   Influenza,trivalent, recombinat, inj, PF 05/08/2013   Influenza-Unspecified 02/23/2016, 05/25/2021   Moderna Covid-19 Fall Seasonal Vaccine 61yrs & older 03/07/2024   Moderna Covid-19 Vaccine Bivalent Booster 60yrs & up 06/01/2021   Moderna SARS-COV2 Booster Vaccination 12/15/2020   Moderna Sars-Covid-2 Vaccination 11/14/2019, 12/12/2019, 06/13/2020   PNEUMOCOCCAL CONJUGATE-20 05/24/2021   Pneumococcal Conjugate-13 12/27/2010   Pneumococcal Polysaccharide-23 05/04/2011   Pneumococcal-Unspecified 05/04/2011, 05/25/2021   Tdap 02/25/2013   Unspecified SARS-COV-2 Vaccination 04/26/2022   Zoster Recombinant(Shingrix ) 08/25/2021, 01/27/2022    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   Anxiety    ARF (acute renal failure) 07/16/2019   Arthritis    Asthma    Chronic insomnia 10/27/2012   Colon polyps    Complex tear of medial meniscus of right knee as current injury 10/22/2013   Depression    Diabetes mellitus without complication (HCC)    Elevated troponin 11/10/2023   GERD (gastroesophageal reflux disease)    Headache    occasional headaches    High cholesterol    Hypertension    Lithium  toxicity 06/2019    has since resolved after discontinuing    PONV (postoperative nausea and vomiting)    Primary insomnia 08/20/2019   Shortness of breath dyspnea    due to weight gain    Sleep apnea    has not use CPAP machine in 2 yrs/ DOES NOT KNOW IF SHE NEEDS TO USE C - PAP   SVT (supraventricular tachycardia)    Syncope and collapse    due to severe knee pain    Total knee replacement status 04/16/2014    Tobacco History: Social History   Tobacco Use  Smoking Status Never  Smokeless Tobacco Never   Counseling given: Not Answered   Outpatient Medications Prior to Visit  Medication Sig Dispense Refill   albuterol  (VENTOLIN  HFA) 108 (90 Base) MCG/ACT inhaler Inhale 1-2 puffs into the lungs every 6 (six) hours as needed for wheezing or shortness of breath. 1 each 11   benzonatate  (TESSALON ) 200 MG capsule TAKE 1 CAPSULE (200 MG TOTAL) BY MOUTH 3 (THREE) TIMES DAILY AS NEEDED FOR COUGH. 90 capsule 3   bisoprolol  (ZEBETA ) 5 MG tablet Take 0.5 tablets (2.5 mg total)  by mouth daily. 45 tablet 3   budeson-glycopyrrolate -formoterol  (BREZTRI  AEROSPHERE) 160-9-4.8 MCG/ACT AERO Inhale 2 puffs into the lungs in the morning and at bedtime. 1 each 12   fluocinonide  cream (LIDEX ) 0.05 % APPLY TO AFFECTED AREA TWICE A DAY 30 g 5   furosemide  (LASIX ) 40 MG tablet Take 1 tablet (40 mg total) by mouth daily. (Patient taking differently: Take 40 mg by mouth as needed.) 90 tablet 0   gabapentin  (NEURONTIN ) 300 MG capsule Take 1 capsule by mouth 2 (two) times daily.     Glucose Blood (BLOOD GLUCOSE TEST STRIPS) STRP Check BS in the morning, at noon and at bedtime Dx R73.03 300 strip 3   Lancets (ONETOUCH DELICA PLUS LANCET33G) MISC Check BS in morning, at noon and at bedtime Dx R73.03 300 each 3   levothyroxine  (SYNTHROID ) 100 MCG tablet TAKE 1 TABLET BY MOUTH EVERY DAY BEFORE BREAKFAST 90 tablet 2   linaclotide  (LINZESS ) 145 MCG CAPS capsule Take 1 capsule (145 mcg total) by mouth daily. To regulate bowel movements  30 capsule 5   Mepolizumab  (NUCALA ) 100 MG/ML SOAJ Inject 1 mL (100 mg total) into the skin every 28 (twenty-eight) days. Faxed w patient assistance application 1 mL 5   metFORMIN  (GLUCOPHAGE -XR) 500 MG 24 hr tablet Take 1 tablet (500 mg total) by mouth daily with breakfast. 90 tablet 3   methocarbamol  (ROBAXIN ) 500 MG tablet Take 500 mg by mouth every 6 (six) hours as needed for muscle spasms.     montelukast  (SINGULAIR ) 10 MG tablet TAKE 1 TABLET BY MOUTH EVERYDAY AT BEDTIME 90 tablet 1   promethazine  (PHENERGAN ) 25 MG tablet Take 1 tablet (25 mg total) by mouth every 6 (six) hours as needed for nausea or vomiting. 90 tablet 5   rosuvastatin  (CRESTOR ) 40 MG tablet Take 1 tablet (40 mg total) by mouth daily. 90 tablet 0   sertraline  (ZOLOFT ) 50 MG tablet TAKE 1 TABLET BY MOUTH EVERYDAY AT BEDTIME 90 tablet 0   tirzepatide  (MOUNJARO ) 5 MG/0.5ML Pen Inject 5 mg into the skin once a week. 6 mL 0   traZODone  (DESYREL ) 100 MG tablet TAKE 3 TABLETS (300MG ) BY MOUTH EVERY DAY AT BEDTIME 270 tablet 0   No facility-administered medications prior to visit.   Review of Systems  Review of Systems  Constitutional: Negative.   HENT: Negative.    Respiratory:  Negative for cough, shortness of breath and wheezing.   Cardiovascular: Negative.      Physical Exam  BP 136/80   Pulse 62   Temp 97.6 F (36.4 C)   Ht 5' 4 (1.626 m)   Wt (!) 323 lb 3.2 oz (146.6 kg)   SpO2 90% Comment: ra  BMI 55.48 kg/m  Physical Exam Constitutional:      General: She is not in acute distress.    Appearance: Normal appearance. She is well-developed. She is obese. She is not ill-appearing.  HENT:     Head: Normocephalic and atraumatic.     Mouth/Throat:     Mouth: Mucous membranes are moist.     Pharynx: Oropharynx is clear.  Eyes:     Pupils: Pupils are equal, round, and reactive to light.  Cardiovascular:     Rate and Rhythm: Normal rate and regular rhythm.     Heart sounds: Normal heart sounds. No murmur  heard. Pulmonary:     Effort: Pulmonary effort is normal. No respiratory distress.     Breath sounds: Normal breath sounds. No wheezing or rhonchi.  Comments: CTA Abdominal:     General: Bowel sounds are normal.     Palpations: Abdomen is soft.     Tenderness: There is no abdominal tenderness.  Musculoskeletal:        General: Normal range of motion.     Cervical back: Normal range of motion and neck supple.  Skin:    General: Skin is warm and dry.     Findings: No erythema or rash.  Neurological:     General: No focal deficit present.     Mental Status: She is alert and oriented to person, place, and time. Mental status is at baseline.  Psychiatric:        Mood and Affect: Mood normal.        Behavior: Behavior normal.        Thought Content: Thought content normal.        Judgment: Judgment normal.     Lab Results:  CBC    Component Value Date/Time   WBC 7.2 02/26/2024 1324   WBC 11.9 (H) 11/09/2023 2033   RBC 4.20 02/26/2024 1324   RBC 4.68 11/09/2023 2033   HGB 12.8 02/26/2024 1324   HCT 41.0 02/26/2024 1324   PLT 187 02/26/2024 1324   MCV 98 (H) 02/26/2024 1324   MCH 30.5 02/26/2024 1324   MCH 31.6 11/09/2023 2033   MCHC 31.2 (L) 02/26/2024 1324   MCHC 32.4 11/09/2023 2033   RDW 13.3 02/26/2024 1324   LYMPHSABS 1.5 02/26/2024 1324   MONOABS 0.5 11/15/2022 1613   EOSABS 0.1 02/26/2024 1324   BASOSABS 0.0 02/26/2024 1324    BMET    Component Value Date/Time   NA 144 02/26/2024 1324   K 4.7 02/26/2024 1324   CL 104 02/26/2024 1324   CO2 24 02/26/2024 1324   GLUCOSE 153 (H) 02/26/2024 1324   GLUCOSE 108 (H) 11/11/2023 0359   BUN 18 02/26/2024 1324   CREATININE 1.13 (H) 02/26/2024 1324   CALCIUM  9.6 02/26/2024 1324   GFRNONAA 46 (L) 11/11/2023 0359   GFRAA 58 (L) 08/18/2020 1635    BNP    Component Value Date/Time   BNP 94.0 11/09/2023 0833    ProBNP    Component Value Date/Time   PROBNP 40.0 11/15/2022 1613    Imaging: No results  found.   Assessment & Plan:   1. Mild intermittent asthma without complication  2. Nocturnal hypoxemia due to obesity (Primary)  Assessment and Plan Assessment & Plan Chronic respiratory failure with hypoxia and obesity hypoventilation syndrome Recent sleep study showed no significant obstructive sleep apnea, with an average of 2.6 events per hour. Lowest oxygen  saturation was 83%, improved to 92% with 2L supplemental oxygen . No need for CPAP. Weight loss is emphasized as a key factor in improving respiratory function and potentially discontinuing oxygen  therapy. - Continue using 2L supplemental oxygen  at night. - Use oxygen  during moderate exertion or when oxygen  saturation drops below 88%. - Encourage weight loss to improve respiratory function and potentially discontinue oxygen  therapy.  Obesity Currently on Mounjaro  5 mg, with plans to increase the dose based on tolerability. Weight loss is crucial for improving respiratory function and overall health. Discussed dietary habits and the importance of protein intake to prevent malnutrition. Advised to avoid heavy dairy or fried foods and to focus on small meals and adequate protein intake. - Continue Mounjaro  with dose adjustments as tolerated, aiming for 10-15 mg for effectiveness. - Encourage dietary changes focusing on small meals, adequate protein intake, and avoiding  heavy dairy or fried foods. - Monitor weight and adjust treatment as necessary.  Mild intermittent asthma Mild intermittent asthma, well-controlled with current medications. No recent asthma attacks, occasional use of rescue inhaler, but not on a daily basis. Breztri  used morning and evening, and Nucala  injections are self-administered. - Continue Breztri  morning and evening. - Continue Nucala  injections as prescribed. - Use rescue inhaler as needed.    Almarie LELON Ferrari, NP 04/16/2024

## 2024-04-18 NOTE — Progress Notes (Signed)
 [name]                                          MRN: [id number]   04/18/2024   The [facility] Quality Team Specialist reviewed this patient medical record for the purposes of chart review for care gap closure. The following were reviewed: abstraction for care gap closure-diabetic eye exam and glycemic status assessment AND CHART REVIEW FOR GAP CLOSURE OF COLORECTAL CANCER SCREENING.     [facility] Quality Team

## 2024-04-26 ENCOUNTER — Other Ambulatory Visit: Payer: Self-pay | Admitting: Family Medicine

## 2024-04-26 DIAGNOSIS — R110 Nausea: Secondary | ICD-10-CM

## 2024-04-26 DIAGNOSIS — E7800 Pure hypercholesterolemia, unspecified: Secondary | ICD-10-CM

## 2024-05-12 DIAGNOSIS — J9601 Acute respiratory failure with hypoxia: Secondary | ICD-10-CM | POA: Diagnosis not present

## 2024-05-12 DIAGNOSIS — R0602 Shortness of breath: Secondary | ICD-10-CM | POA: Diagnosis not present

## 2024-05-13 DIAGNOSIS — R0602 Shortness of breath: Secondary | ICD-10-CM | POA: Diagnosis not present

## 2024-05-13 DIAGNOSIS — J4520 Mild intermittent asthma, uncomplicated: Secondary | ICD-10-CM | POA: Diagnosis not present

## 2024-05-13 DIAGNOSIS — J9601 Acute respiratory failure with hypoxia: Secondary | ICD-10-CM | POA: Diagnosis not present

## 2024-05-28 ENCOUNTER — Ambulatory Visit: Payer: Self-pay | Admitting: Family Medicine

## 2024-05-28 ENCOUNTER — Ambulatory Visit (INDEPENDENT_AMBULATORY_CARE_PROVIDER_SITE_OTHER): Payer: Self-pay | Admitting: Family Medicine

## 2024-05-28 ENCOUNTER — Encounter: Payer: Self-pay | Admitting: Family Medicine

## 2024-05-28 VITALS — BP 121/67 | HR 66 | Temp 97.5°F | Ht 64.0 in | Wt 315.0 lb

## 2024-05-28 DIAGNOSIS — I10 Essential (primary) hypertension: Secondary | ICD-10-CM | POA: Diagnosis not present

## 2024-05-28 DIAGNOSIS — M7989 Other specified soft tissue disorders: Secondary | ICD-10-CM | POA: Diagnosis not present

## 2024-05-28 DIAGNOSIS — E039 Hypothyroidism, unspecified: Secondary | ICD-10-CM | POA: Diagnosis not present

## 2024-05-28 DIAGNOSIS — E1169 Type 2 diabetes mellitus with other specified complication: Secondary | ICD-10-CM

## 2024-05-28 DIAGNOSIS — Z6841 Body Mass Index (BMI) 40.0 and over, adult: Secondary | ICD-10-CM

## 2024-05-28 DIAGNOSIS — F5101 Primary insomnia: Secondary | ICD-10-CM

## 2024-05-28 DIAGNOSIS — E7800 Pure hypercholesterolemia, unspecified: Secondary | ICD-10-CM | POA: Diagnosis not present

## 2024-05-28 DIAGNOSIS — E756 Lipid storage disorder, unspecified: Secondary | ICD-10-CM

## 2024-05-28 DIAGNOSIS — J8283 Eosinophilic asthma: Secondary | ICD-10-CM

## 2024-05-28 DIAGNOSIS — F331 Major depressive disorder, recurrent, moderate: Secondary | ICD-10-CM

## 2024-05-28 DIAGNOSIS — Z7985 Long-term (current) use of injectable non-insulin antidiabetic drugs: Secondary | ICD-10-CM | POA: Diagnosis not present

## 2024-05-28 LAB — LIPID PANEL

## 2024-05-28 LAB — BAYER DCA HB A1C WAIVED: HB A1C (BAYER DCA - WAIVED): 5.6 % (ref 4.8–5.6)

## 2024-05-28 MED ORDER — NABUMETONE 500 MG PO TABS
1000.0000 mg | ORAL_TABLET | Freq: Two times a day (BID) | ORAL | 1 refills | Status: AC
Start: 2024-05-28 — End: ?

## 2024-05-28 MED ORDER — TRAZODONE HCL 100 MG PO TABS: ORAL_TABLET | ORAL | 0 refills | Status: AC

## 2024-05-28 MED ORDER — SERTRALINE HCL 50 MG PO TABS: ORAL_TABLET | ORAL | 0 refills | Status: AC

## 2024-05-28 MED ORDER — MONTELUKAST SODIUM 10 MG PO TABS: ORAL_TABLET | ORAL | 1 refills | Status: AC

## 2024-05-28 MED ORDER — LEVOTHYROXINE SODIUM 100 MCG PO TABS: ORAL_TABLET | ORAL | 2 refills | Status: AC

## 2024-05-28 MED ORDER — ROSUVASTATIN CALCIUM 40 MG PO TABS: 40.0000 mg | ORAL_TABLET | Freq: Every day | ORAL | 0 refills | Status: AC

## 2024-05-28 MED ORDER — TIRZEPATIDE 7.5 MG/0.5ML ~~LOC~~ SOAJ: 7.5000 mg | SUBCUTANEOUS | 1 refills | Status: DC

## 2024-05-28 NOTE — Progress Notes (Signed)
 Subjective:  Patient ID: [name], female    DOB: 1961/05/05  Age: 63 y.o. MRN: [id number]  CC: Medical Management of Chronic Issues (Mounjaro  refill or increase dose. )   HPI  Discussed the use of AI scribe software for clinical note transcription with the patient, who gave verbal consent to proceed.  History of Present Illness [name] is a 63 year old female who presents for follow-up care.  She has a history of hypothyroidism, which is well-controlled with no ongoing symptoms such as loss of energy, depression, skin changes, bowel changes, or hair changes.  She is being treated for hypertension and is taking her medication as ordered. She has no questions about this.  She has diabetes and is working on weight loss, having lost 19 pounds over the past three months. She is taking a GLP-1 agonist, Mounjaro , to assist with weight loss and is following a low-calorie diet.  She continues to take Crestor . She experiences myalgia and arthralgia.  She experiences insomnia, for which she takes trazodone , and it is working adequately.  She has a history of eosinophilic asthma, managed with montelukast , which has prevented significant shortness of breath. She uses a rescue inhaler with albuterol  and a maintenance medication, Breztri  Aerosphere, though she is not using it regularly. She has reduced furosemide  to PRN use due to minimal swelling.  Gabapentin  is helping with her back and joint pains adequately.          05/28/2024    1:47 PM 03/18/2024    1:35 PM 02/26/2024    1:18 PM  Depression screen PHQ 2/9  Decreased Interest 1 0 1  Down, Depressed, Hopeless 1 1 1   PHQ - 2 Score 2 1 2   Altered sleeping 0 0 0  Tired, decreased energy 0 1 1  Change in appetite 0 0 0  Feeling bad or failure about yourself  0 0 1  Trouble concentrating 0 0 0  Moving slowly or fidgety/restless 0 0 0  Suicidal thoughts 0 0 0  PHQ-9 Score 2 2 4   Difficult doing work/chores Not difficult at  all  Not difficult at all    History [name] has a past medical history of Anemia, Aneurysmal bone cyst, other site (10/22/2013), Anxiety, ARF (acute renal failure) (07/16/2019), Arthritis, Asthma, Chronic insomnia (10/27/2012), Colon polyps, Complex tear of medial meniscus of right knee as current injury (10/22/2013), Depression, Diabetes mellitus without complication (HCC), Elevated troponin (11/10/2023), GERD (gastroesophageal reflux disease), Headache, High cholesterol, Hypertension, Lithium  toxicity (06/2019), PONV (postoperative nausea and vomiting), Primary insomnia (08/20/2019), Shortness of breath dyspnea, Sleep apnea, SVT (supraventricular tachycardia), Syncope and collapse, and Total knee replacement status (04/16/2014).   She has a past surgical history that includes Endometrial ablation w/ novasure; Carpal tunnel release (07/25/2005); trigger finger release surgeyr ; Tubal ligation; Knee arthroscopy; Knee arthroscopy with medial menisectomy (Right, 10/22/2013); Back surgery (07/25/2010); Colonoscopy; Excision metacarpal mass (Right, 03/10/2014); Total knee arthroplasty (Left, 04/16/2014); Irrigation and debridement knee (Left, 05/23/2014); Total knee arthroplasty (Right, 12/23/2014); Excisional total knee arthroplasty with antibiotic spacers (Left, 09/19/2019); Joint replacement; and Spine surgery.   Her family history includes Alcohol  abuse in her paternal grandfather; Arthritis in her mother; Breast cancer in her maternal grandmother; Cancer in her brother, daughter, and maternal grandmother; Gout in her father; Heart attack in her maternal grandfather; Heart disease in her mother; Heart disease (age of onset: 22) in her father; Hyperlipidemia in her father; Hypertension in her father and sister; Tuberculosis in her paternal grandfather.She  reports that she has never smoked. She has never used smokeless tobacco. She reports that she does not drink alcohol  and does not use  drugs.    ROS Review of Systems  Constitutional: Negative.   HENT:  Negative for congestion.   Eyes:  Negative for visual disturbance.  Respiratory:  Negative for shortness of breath.   Cardiovascular:  Negative for chest pain.  Gastrointestinal:  Negative for abdominal pain, constipation, diarrhea, nausea and vomiting.  Genitourinary:  Negative for difficulty urinating.  Musculoskeletal:  Positive for arthralgias and back pain. Negative for myalgias.  Skin:  Negative for rash.  Neurological:  Negative for headaches.  Psychiatric/Behavioral:  Negative for sleep disturbance.     Objective:  BP 121/67   Pulse 66   Temp (!) 97.5 F (36.4 C)   Ht 5' 4 (1.626 m)   Wt (!) 315 lb (142.9 kg)   SpO2 95%   BMI 54.07 kg/m   BP Readings from Last 3 Encounters:  05/28/24 121/67  04/16/24 136/80  03/18/24 114/72    Wt Readings from Last 3 Encounters:  05/28/24 (!) 315 lb (142.9 kg)  04/16/24 (!) 323 lb 3.2 oz (146.6 kg)  03/18/24 (!) 328 lb 8 oz (149 kg)     Physical Exam Constitutional:      General: She is not in acute distress.    Appearance: She is well-developed. She is obese.  Cardiovascular:     Rate and Rhythm: Normal rate and regular rhythm.  Pulmonary:     Breath sounds: Normal breath sounds.  Musculoskeletal:        General: Normal range of motion.  Skin:    General: Skin is warm and dry.  Neurological:     Mental Status: She is alert and oriented to person, place, and time.    Physical Exam MEASUREMENTS: Weight- down nineteen pounds from when she was here three months ago. GENERAL: Alert, cooperative, well developed, no acute distress HEENT: Normocephalic, normal oropharynx, moist mucous membranes CHEST: Clear to auscultation bilaterally, No wheezes, rhonchi, or crackles CARDIOVASCULAR: Normal heart rate and rhythm, S1 and S2 normal without murmurs ABDOMEN: Soft, non-tender, non-distended, without organomegaly, Normal bowel sounds EXTREMITIES: No  cyanosis or edema NEUROLOGICAL: Cranial nerves grossly intact, Moves all extremities without gross motor or sensory deficit   Assessment & Plan:  Hypothyroidism, unspecified type -     TSH + free T4 -     Levothyroxine  Sodium; TAKE 1 TABLET BY MOUTH EVERY DAY BEFORE BREAKFAST  Dispense: 90 tablet; Refill: 2  Essential hypertension, benign -     Comprehensive metabolic panel with GFR -     CBC with Differential/Platelet  Diabetic lipidosis (HCC) -     Bayer DCA Hb A1c Waived -     Microalbumin / creatinine urine ratio -     Rosuvastatin  Calcium ; Take 1 tablet (40 mg total) by mouth daily.  Dispense: 90 tablet; Refill: 0  High cholesterol -     Lipid panel -     Rosuvastatin  Calcium ; Take 1 tablet (40 mg total) by mouth daily.  Dispense: 90 tablet; Refill: 0  Morbid obesity with BMI of 50.0-59.9, adult (HCC) -     Comprehensive metabolic panel with GFR -     CBC with Differential/Platelet  Bilateral swelling of feet  Moderate episode of recurrent major depressive disorder (HCC) -     Sertraline  HCl; TAKE 1 TABLET BY MOUTH EVERYDAY AT BEDTIME  Dispense: 90 tablet; Refill: 0  Eosinophilic asthma -  Montelukast  Sodium; TAKE 1 TABLET BY MOUTH EVERYDAY AT BEDTIME  Dispense: 90 tablet; Refill: 1  Primary insomnia -     traZODone  HCl; TAKE 3 TABLETS (300MG ) BY MOUTH EVERY DAY AT BEDTIME  Dispense: 270 tablet; Refill: 0  Other orders -     Tirzepatide ; Inject 7.5 mg into the skin once a week.  Dispense: 6 mL; Refill: 1 -     Nabumetone ; Take 2 tablets (1,000 mg total) by mouth 2 (two) times daily. For muscle and joint pain  Dispense: 360 tablet; Refill: 1    Assessment and Plan Assessment & Plan Type 2 diabetes mellitus   Diabetes is well-controlled with a low-calorie diet and Mounjaro , resulting in a 19-pound weight loss over the last three months. Continue the current diet and Mounjaro .  Morbid obesity   She is actively losing weight with the help of Mounjaro . Continue  Mounjaro  for weight reduction.  Hypercholesterolemia   Crestor  is managing her cholesterol effectively and is well tolerated. Check liver function tests as part of today's blood work.  Essential hypertension   Her hypertension is well-controlled with the current medication regimen.  Eosinophilic asthma   Asthma is controlled with montelukast , preventing significant shortness of breath. She has a rescue inhaler with albuterol  and maintenance medication with Breztri  Aerosphere, though she is not used regularly.  Chronic back and joint pain   Pain is adequately managed with gabapentin .  Hypothyroidism   She has no ongoing symptoms such as loss of energy, depression, skin changes, bowel changes, or hair changes.  Insomnia   Insomnia is managed with trazodone , which is working adequately. Refill trazodone  prescription.       Follow-up: Return in about 3 months (around 08/28/2024).  Butler Der, M.D.

## 2024-05-29 LAB — COMPREHENSIVE METABOLIC PANEL WITH GFR
ALT: 22 IU/L (ref 0–32)
AST: 26 IU/L (ref 0–40)
Albumin: 4.7 g/dL (ref 3.9–4.9)
Alkaline Phosphatase: 129 IU/L (ref 49–135)
BUN/Creatinine Ratio: 15 (ref 12–28)
BUN: 17 mg/dL (ref 8–27)
Bilirubin Total: 0.3 mg/dL (ref 0.0–1.2)
CO2: 22 mmol/L (ref 20–29)
Calcium: 9.9 mg/dL (ref 8.7–10.3)
Chloride: 101 mmol/L (ref 96–106)
Creatinine, Ser: 1.12 mg/dL — AB (ref 0.57–1.00)
Globulin, Total: 2.7 g/dL (ref 1.5–4.5)
Glucose: 95 mg/dL (ref 70–99)
Potassium: 4.8 mmol/L (ref 3.5–5.2)
Sodium: 139 mmol/L (ref 134–144)
Total Protein: 7.4 g/dL (ref 6.0–8.5)
eGFR: 56 mL/min/1.73 — AB (ref >59)

## 2024-05-29 LAB — LIPID PANEL
Cholesterol, Total: 134 mg/dL (ref 100–199)
HDL: 32 mg/dL — AB (ref >39)
LDL CALC COMMENT:: 4.2 ratio (ref 0.0–4.4)
LDL Chol Calc (NIH): 68 mg/dL (ref 0–99)
Triglycerides: 201 mg/dL — AB (ref 0–149)
VLDL Cholesterol Cal: 34 mg/dL (ref 5–40)

## 2024-05-29 LAB — CBC WITH DIFFERENTIAL/PLATELET
Basophils Absolute: 0 x10E3/uL (ref 0.0–0.2)
Basos: 1 %
EOS (ABSOLUTE): 0.1 x10E3/uL (ref 0.0–0.4)
Eos: 1 %
Hematocrit: 45.1 % (ref 34.0–46.6)
Hemoglobin: 14.8 g/dL (ref 11.1–15.9)
Immature Grans (Abs): 0 x10E3/uL (ref 0.0–0.1)
Immature Granulocytes: 0 %
Lymphocytes Absolute: 2.2 x10E3/uL (ref 0.7–3.1)
Lymphs: 27 %
MCH: 31.3 pg (ref 26.6–33.0)
MCHC: 32.8 g/dL (ref 31.5–35.7)
MCV: 95 fL (ref 79–97)
Monocytes Absolute: 0.7 x10E3/uL (ref 0.1–0.9)
Monocytes: 9 %
Neutrophils Absolute: 5.1 x10E3/uL (ref 1.4–7.0)
Neutrophils: 62 %
Platelets: 218 x10E3/uL (ref 150–450)
RBC: 4.73 x10E6/uL (ref 3.77–5.28)
RDW: 13.5 % (ref 11.7–15.4)
WBC: 8.2 x10E3/uL (ref 3.4–10.8)

## 2024-05-29 LAB — TSH+FREE T4
Free T4: 1.68 ng/dL (ref 0.82–1.77)
TSH: 1.82 u[IU]/mL (ref 0.450–4.500)

## 2024-05-29 NOTE — Progress Notes (Signed)
 Hello Kynsli,  Your lab result is normal and/or stable.Some minor variations that are not significant are commonly marked abnormal, but do not represent any medical problem for you.  Best regards, [name], M.D.

## 2024-06-12 DIAGNOSIS — J9601 Acute respiratory failure with hypoxia: Secondary | ICD-10-CM | POA: Diagnosis not present

## 2024-06-12 DIAGNOSIS — R0602 Shortness of breath: Secondary | ICD-10-CM | POA: Diagnosis not present

## 2024-06-12 DIAGNOSIS — J4520 Mild intermittent asthma, uncomplicated: Secondary | ICD-10-CM | POA: Diagnosis not present

## 2024-06-13 DIAGNOSIS — J4520 Mild intermittent asthma, uncomplicated: Secondary | ICD-10-CM | POA: Diagnosis not present

## 2024-06-13 DIAGNOSIS — R0602 Shortness of breath: Secondary | ICD-10-CM | POA: Diagnosis not present

## 2024-06-13 DIAGNOSIS — J9601 Acute respiratory failure with hypoxia: Secondary | ICD-10-CM | POA: Diagnosis not present

## 2024-06-14 ENCOUNTER — Telehealth: Payer: Self-pay

## 2024-06-14 DIAGNOSIS — J4550 Severe persistent asthma, uncomplicated: Secondary | ICD-10-CM

## 2024-06-14 NOTE — Telephone Encounter (Signed)
 Patient enrolled into asthma grant through PAF: Amount: $2000 Award Period: 12/17/2023 - 06/14/2025 ID: [id number] BIN: [id number] PCN: [id number] Group: [id number] For pharmacy inquiries, contact PDMI at [phone]. For patient inquiries, contact PAF at [phone].  MyChart message sent ot patient. Patient can fill with [facility] after 07/25/2024. She should continue filling Nucala  through [facility] through end of 2025 calendar year

## 2024-07-07 ENCOUNTER — Other Ambulatory Visit: Payer: Self-pay | Admitting: Pulmonary Disease

## 2024-07-12 DIAGNOSIS — J4520 Mild intermittent asthma, uncomplicated: Secondary | ICD-10-CM | POA: Diagnosis not present

## 2024-07-12 DIAGNOSIS — J9601 Acute respiratory failure with hypoxia: Secondary | ICD-10-CM | POA: Diagnosis not present

## 2024-07-12 DIAGNOSIS — R0602 Shortness of breath: Secondary | ICD-10-CM | POA: Diagnosis not present

## 2024-07-13 DIAGNOSIS — J4520 Mild intermittent asthma, uncomplicated: Secondary | ICD-10-CM | POA: Diagnosis not present

## 2024-07-13 DIAGNOSIS — J9601 Acute respiratory failure with hypoxia: Secondary | ICD-10-CM | POA: Diagnosis not present

## 2024-07-13 DIAGNOSIS — R0602 Shortness of breath: Secondary | ICD-10-CM | POA: Diagnosis not present

## 2024-07-24 NOTE — Telephone Encounter (Signed)
 Referral to pharmacotherapy clinic placed.

## 2024-07-26 ENCOUNTER — Ambulatory Visit: Attending: Pulmonary Disease

## 2024-07-26 ENCOUNTER — Other Ambulatory Visit (HOSPITAL_COMMUNITY): Payer: Self-pay

## 2024-07-26 DIAGNOSIS — J4550 Severe persistent asthma, uncomplicated: Secondary | ICD-10-CM

## 2024-07-26 DIAGNOSIS — J8283 Eosinophilic asthma: Secondary | ICD-10-CM

## 2024-07-26 MED ORDER — NUCALA 100 MG/ML ~~LOC~~ SOAJ
100.0000 mg | SUBCUTANEOUS | 5 refills | Status: AC
  Filled 2024-07-30: qty 1, 28d supply, fill #0
  Filled 2024-08-21: qty 1, 28d supply, fill #1

## 2024-07-26 MED ORDER — NUCALA 100 MG/ML ~~LOC~~ SOAJ: 100.0000 mg | SUBCUTANEOUS | 5 refills | Status: DC

## 2024-07-26 NOTE — Telephone Encounter (Signed)
 Rx triaged to [facility] for filling when we receive PA response.

## 2024-07-26 NOTE — Telephone Encounter (Signed)
 Received notification from [facility] regarding a prior authorization for NUCALA . Authorization has been APPROVED from 07/26/2024 to 07/24/25. Approval letter sent to scan center.  Patient can fill through [facility]: [phone]   Sherry Pennant, PharmD, MPH, BCPS, CPP Clinical Pharmacist

## 2024-07-26 NOTE — Progress Notes (Signed)
 [facility]  Referring Provider: Dr. Annella  Virtual Visit via Telephone Note  I connected with  on 07/26/2024 at 11:00 AM EST by telephone and verified that I am speaking with the correct person using two identifiers.  Location: Patient: home Provider: office   I discussed the limitations, risks, security and privacy concerns of performing an evaluation and management service by telephone and the availability of in person appointments. I also discussed with the patient that there may be a patient responsible charge related to this service. The patient expressed understanding and agreed to proceed.   HPI: [name] is a 64 y.o. female who presents to the pharmacotherapy clinic via telephone for Nucala  follow-up counseling. PMH includes severe persistent asthma. Last visit with pulmonary care was on 04/16/24 with Landry Ferrari, NP. Plan to continue Nucala . Contact with pharmacy team today is regarding plan for how to obtain Nucala  in 2026 - previously obtained Nucala  through patient assistance in 2025. Covered by grant in 2026 and will be filled at [facility].    Patient Active Problem List   Diagnosis Date Noted   Encounter for screening fecal occult blood testing 03/18/2024   Encounter for well woman exam with routine gynecological exam 03/18/2024   Chest pain 11/10/2023   Obesity, Class III, BMI 40-49.9 (morbid obesity) (HCC) 11/10/2023   SVT (supraventricular tachycardia) 11/10/2023   Nausea 11/30/2020   Mass of soft tissue of hand 01/01/2020   Right hand pain 01/01/2020   Mixed hyperlipidemia 12/03/2019   S/P left TKA revision 09/19/2019   Acquired absence of knee joint following explantation of joint prosthesis with presence of antibiotic-impregnated cement spacer 09/19/2019   Pain in left knee 08/29/2019   Overactive bladder 08/20/2019   Hypothyroidism 06/05/2019   Lumbar radiculopathy 04/03/2019   Internal derangement of multiple sites of knee 04/03/2019    Morbid obesity with BMI of 50.0-59.9, adult (HCC) 01/19/2016   History of total knee arthroplasty 12/23/2014   Transaminasemia 10/21/2014   Osteoarthritis of right knee 10/22/2013   Asthma, mild intermittent 10/26/2012   Osteoarthritis 10/26/2012   History of colonic polyps 10/26/2012   GERD (gastroesophageal reflux disease) 10/26/2012   Essential hypertension, benign 10/26/2012   Shortness of breath 10/02/2012   Depression    Migraines     Patient's Medications  New Prescriptions   No medications on file  Previous Medications   ALBUTEROL  (VENTOLIN  HFA) 108 (90 BASE) MCG/ACT INHALER    Inhale 1-2 puffs into the lungs every 6 (six) hours as needed for wheezing or shortness of breath.   BENZONATATE  (TESSALON ) 200 MG CAPSULE    TAKE 1 CAPSULE (200 MG TOTAL) BY MOUTH 3 (THREE) TIMES DAILY AS NEEDED FOR COUGH.   BISOPROLOL  (ZEBETA ) 5 MG TABLET    Take 0.5 tablets (2.5 mg total) by mouth daily.   BUDESON-GLYCOPYRROLATE -FORMOTEROL  (BREZTRI  AEROSPHERE) 160-9-4.8 MCG/ACT AERO    Inhale 2 puffs into the lungs in the morning and at bedtime.   FLUOCINONIDE  CREAM (LIDEX ) 0.05 %    APPLY TO AFFECTED AREA TWICE A DAY   FUROSEMIDE  (LASIX ) 40 MG TABLET    Take 1 tablet (40 mg total) by mouth daily.   GABAPENTIN  (NEURONTIN ) 300 MG CAPSULE    Take 1 capsule by mouth 2 (two) times daily.   GLUCOSE BLOOD (BLOOD GLUCOSE TEST STRIPS) STRP    Check BS in the morning, at noon and at bedtime Dx R73.03   LANCETS (ONETOUCH DELICA PLUS LANCET33G) MISC    Check BS in  morning, at noon and at bedtime Dx R73.03   LEVOTHYROXINE  (SYNTHROID ) 100 MCG TABLET    TAKE 1 TABLET BY MOUTH EVERY DAY BEFORE BREAKFAST   LINACLOTIDE  (LINZESS ) 145 MCG CAPS CAPSULE    Take 1 capsule (145 mcg total) by mouth daily. To regulate bowel movements   MEPOLIZUMAB  (NUCALA ) 100 MG/ML SOAJ    Inject 1 mL (100 mg total) into the skin every 28 (twenty-eight) days. Faxed w patient assistance application   METFORMIN  (GLUCOPHAGE -XR) 500 MG 24 HR  TABLET    Take 1 tablet (500 mg total) by mouth daily with breakfast.   METHOCARBAMOL  (ROBAXIN ) 500 MG TABLET    Take 500 mg by mouth every 6 (six) hours as needed for muscle spasms.   MONTELUKAST  (SINGULAIR ) 10 MG TABLET    TAKE 1 TABLET BY MOUTH EVERYDAY AT BEDTIME   NABUMETONE  (RELAFEN ) 500 MG TABLET    Take 2 tablets (1,000 mg total) by mouth 2 (two) times daily. For muscle and joint pain   PROMETHAZINE  (PHENERGAN ) 25 MG TABLET    TAKE 1 TABLET BY MOUTH EVERY 6 HOURS AS NEEDED FOR NAUSEA OR VOMITING.   ROSUVASTATIN  (CRESTOR ) 40 MG TABLET    Take 1 tablet (40 mg total) by mouth daily.   SERTRALINE  (ZOLOFT ) 50 MG TABLET    TAKE 1 TABLET BY MOUTH EVERYDAY AT BEDTIME   TIRZEPATIDE  (MOUNJARO ) 7.5 MG/0.5ML PEN    Inject 7.5 mg into the skin once a week.   TRAZODONE  (DESYREL ) 100 MG TABLET    TAKE 3 TABLETS (300MG ) BY MOUTH EVERY DAY AT BEDTIME  Modified Medications   No medications on file  Discontinued Medications   No medications on file    Allergies: Allergies[1]  Past Medical History: 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   Anxiety    ARF (acute renal failure) 07/16/2019   Arthritis    Asthma    Chronic insomnia 10/27/2012   Colon polyps    Complex tear of medial meniscus of right knee as current injury 10/22/2013   Depression    Diabetes mellitus without complication (HCC)    Elevated troponin 11/10/2023   GERD (gastroesophageal reflux disease)    Headache    occasional headaches    High cholesterol    Hypertension    Lithium  toxicity 06/2019   has since resolved after discontinuing    PONV (postoperative nausea and vomiting)    Primary insomnia 08/20/2019   Shortness of breath dyspnea    due to weight gain    Sleep apnea    has not use CPAP machine in 2 yrs/ DOES NOT KNOW IF SHE NEEDS TO USE C - PAP   SVT (supraventricular tachycardia)    Syncope and collapse    due to severe knee pain     Total knee replacement status 04/16/2014    Social History: Social History   Socioeconomic History   Marital status: Married    Spouse name: [name]   Number of children: 2   Years of education: 12   Highest education level: Some college, no degree  Occupational History    Comment: Disabled  Tobacco Use   Smoking status: Never   Smokeless tobacco: Never  Vaping Use   Vaping status: Never Used  Substance and Sexual Activity   Alcohol  use: No   Drug use: No   Sexual activity: Yes    Birth  control/protection: Surgical    Comment: tubal & ablation  Other Topics Concern   Not on file  Social History Narrative   Lives with her husband who helps with grocery shopping and other transportation needs.     Has two children, Bebe and Darice and grandchildren - they all live nearby.   There are two outside dogs and several  Horses.    Social Drivers of Health   Tobacco Use: Low Risk (05/28/2024)   Patient History    Smoking Tobacco Use: Never    Smokeless Tobacco Use: Never    Passive Exposure: Not on file  Financial Resource Strain: Low Risk (05/27/2024)   Overall Financial Resource Strain (CARDIA)    Difficulty of Paying Living Expenses: Not hard at all  Food Insecurity: No Food Insecurity (05/27/2024)   Epic    Worried About Programme Researcher, Broadcasting/film/video in the Last Year: Never true    Ran Out of Food in the Last Year: Never true  Transportation Needs: No Transportation Needs (05/27/2024)   Epic    Lack of Transportation (Medical): No    Lack of Transportation (Non-Medical): No  Physical Activity: Inactive (05/27/2024)   Exercise Vital Sign    Days of Exercise per Week: 0 days    Minutes of Exercise per Session: Not on file  Stress: No Stress Concern Present (05/27/2024)   [employer] of Occupational Health - Occupational Stress Questionnaire    Feeling of Stress: Only a little  Social Connections: Socially Integrated (05/27/2024)   Social Connection and Isolation Panel     Frequency of Communication with Friends and Family: More than three times a week    Frequency of Social Gatherings with Friends and Family: More than three times a week    Attends Religious Services: More than 4 times per year    Active Member of Clubs or Organizations: Yes    Attends Banker Meetings: More than 4 times per year    Marital Status: Married  Depression (PHQ2-9): Low Risk (05/28/2024)   Depression (PHQ2-9)    PHQ-2 Score: 2  Alcohol  Screen: Low Risk (03/18/2024)   Alcohol  Screen    Last Alcohol  Screening Score (AUDIT): 0  Housing: Low Risk (05/27/2024)   Epic    Unable to Pay for Housing in the Last Year: No    Number of Times Moved in the Last Year: 0    Homeless in the Last Year: No  Utilities: Not At Risk (03/18/2024)   Epic    Threatened with loss of utilities: No  Health Literacy: Adequate Health Literacy (07/20/2023)   B1300 Health Literacy    Frequency of need for help with medical instructions: Never    Medication: Nucala  100mg /mL [place] autoinjector  Dosing: 100mg  [place] every 28 days   Patient enrolled into asthma grant through PAF: Amount: $2000 Award Period: 12/17/2023 - 06/14/2025 ID: [id number] BIN: [id number] PCN: [id number] Group: [id number] For pharmacy inquiries, contact PDMI at [phone]. For patient inquiries, contact PAF at [phone].  Rx will be triaged to [facility]: [phone]  Plan: - CONTINUE Nucala  100mg   every 28 days - Continue remaining respiratory regimen - Expect phone call from Chasadee for onboarding - Follow-up with Dr. Annella or Landry Ferrari in March 2026 as recommended  I discussed the assessment and treatment plan with the patient. The patient was provided an opportunity to ask questions and all were answered. The patient agreed with the plan and demonstrated an understanding of the instructions.  The patient was advised to call back or seek an in-person evaluation if the symptoms worsen or  if the condition fails to improve as anticipated.  I provided 10 minutes of non-face-to-face time during this encounter.  Aleck Puls, PharmD, BCPS, CPP Clinical Pharmacist  [facility]  [facility]     [1]  Allergies Allergen Reactions   Venlafaxine Other (See Comments)    Pt refuses to take makes me feel like I am dying   Phentermine      Tachycardia   Adhesive [Tape] Other (See Comments)    Peeled off skin   Ceclor [Cefaclor] Other (See Comments)    Blisters on hands   Chocolate Other (See Comments)    Migraines   Lisinopril  Cough

## 2024-07-26 NOTE — Telephone Encounter (Signed)
 Submitted a Prior Authorization request to [facility] for NUCALA  via CoverMyMeds. Will update once we receive a response.  Key: [id number]

## 2024-07-26 NOTE — Telephone Encounter (Signed)
 PA for Nucala  needs to be updated before grant can be used and before patient can fill with [facility]

## 2024-07-29 ENCOUNTER — Other Ambulatory Visit: Payer: Self-pay

## 2024-07-29 ENCOUNTER — Other Ambulatory Visit (HOSPITAL_COMMUNITY): Payer: Self-pay

## 2024-07-29 ENCOUNTER — Telehealth: Payer: Self-pay

## 2024-07-29 NOTE — Telephone Encounter (Signed)
 Copied from CRM [id number]. Topic: Clinical - Prescription Issue >> Jul 29, 2024  3:36 PM Leila BROCKS wrote: Reason for CRM: Patient [phone] is returning the office/Chasadee's call regarding medication Nucala . Unable to reach CAL, please call back.   Routing to pharmacy

## 2024-07-30 ENCOUNTER — Other Ambulatory Visit (HOSPITAL_COMMUNITY): Payer: Self-pay

## 2024-07-30 ENCOUNTER — Other Ambulatory Visit: Payer: Self-pay

## 2024-07-30 NOTE — Progress Notes (Signed)
 Specialty Pharmacy Initial Fill Coordination Note  [name] is a 64 y.o. female contacted today regarding initial fill of specialty medication(s) Mepolizumab  (Nucala )   Patient requested Delivery   Delivery date: 08/01/24   Verified address: [address] [place], [place] [address]   Medication will be filled on: 07/31/24   Patient is aware of $0 copayment.

## 2024-08-01 NOTE — Progress Notes (Signed)
 Please see counseling note 07/26/2024 - received detailed education prior to Nucala  dispense. Previously received Nucala  from different specialty pharmacy. No changes to therapy.

## 2024-08-15 ENCOUNTER — Ambulatory Visit: Admitting: Pulmonary Disease

## 2024-08-15 DIAGNOSIS — R053 Chronic cough: Secondary | ICD-10-CM

## 2024-08-15 DIAGNOSIS — J4550 Severe persistent asthma, uncomplicated: Secondary | ICD-10-CM

## 2024-08-21 ENCOUNTER — Encounter (INDEPENDENT_AMBULATORY_CARE_PROVIDER_SITE_OTHER): Payer: Self-pay

## 2024-08-21 ENCOUNTER — Other Ambulatory Visit: Payer: Self-pay

## 2024-08-22 ENCOUNTER — Other Ambulatory Visit: Payer: Self-pay

## 2024-08-22 NOTE — Progress Notes (Signed)
 Specialty Pharmacy Refill Coordination Note  [name] is a 64 y.o. female contacted today regarding refills of specialty medication(s) Mepolizumab  (Nucala )   Patient requested Delivery   Delivery date: 08/28/24   Verified address: [address] [place] [address]   Medication will be filled on: 08/27/24

## 2024-08-27 ENCOUNTER — Other Ambulatory Visit: Payer: Self-pay

## 2024-08-29 ENCOUNTER — Encounter: Payer: Self-pay | Admitting: Family Medicine

## 2024-08-29 ENCOUNTER — Ambulatory Visit: Admitting: Family Medicine

## 2024-08-29 VITALS — BP 135/77 | HR 63 | Temp 97.3°F | Ht 64.0 in | Wt 307.5 lb

## 2024-08-29 DIAGNOSIS — E119 Type 2 diabetes mellitus without complications: Secondary | ICD-10-CM

## 2024-08-29 DIAGNOSIS — E039 Hypothyroidism, unspecified: Secondary | ICD-10-CM

## 2024-08-29 DIAGNOSIS — E782 Mixed hyperlipidemia: Secondary | ICD-10-CM

## 2024-08-29 LAB — BAYER DCA HB A1C WAIVED: HB A1C (BAYER DCA - WAIVED): 5.6 % (ref 4.8–5.6)

## 2024-08-29 MED ORDER — TIRZEPATIDE 10 MG/0.5ML ~~LOC~~ SOAJ: 10.0000 mg | SUBCUTANEOUS | 3 refills | Status: AC

## 2024-08-29 NOTE — Progress Notes (Unsigned)
 "  Subjective:  Patient ID: [name], female    DOB: 03/15/1961  Age: 64 y.o. MRN: [id number]  CC: Medical Management of Chronic Issues   HPI  Discussed the use of AI scribe software for clinical note transcription with the patient, who gave verbal consent to proceed.  History of Present Illness [name] is a 64 year old female with type 2 diabetes who presents for follow-up regarding weight loss and blood sugar management.  She has experienced a weight loss of eight pounds over the past three months, averaging two-thirds of a pound per week. She is currently on Mounjaro  and metformin  for diabetes management. She has not been checking her blood sugars at home. No symptoms of hypoglycemia such as weakness, shakiness, hunger, or sweating, but she does experience nausea occasionally. Her appetite is reduced, often skipping breakfast, and when she does eat, she opts for small portions like a scrambled egg or a peanut butter sandwich. She has been mindful of her carbohydrate intake, often removing parts of bread from sandwiches.  Her blood pressure readings over the past year have included 159/91 and more recently 135/77. She has a follow-up appointment scheduled with her cardiologist in March, although she reports no current cardiac issues.  In terms of her social history, she mentions past habits of consuming high-calorie foods like donuts with her sister, but has since changed her eating habits to support her weight loss goals.          08/29/2024    2:03 PM 05/28/2024    1:47 PM 03/18/2024    1:35 PM  Depression screen PHQ 2/9  Decreased Interest 1 1 0  Down, Depressed, Hopeless 1 1 1   PHQ - 2 Score 2 2 1   Altered sleeping 0 0 0  Tired, decreased energy 0 0 1  Change in appetite 0 0 0  Feeling bad or failure about yourself  1 0 0  Trouble concentrating 0 0 0  Moving slowly or fidgety/restless 0 0 0  Suicidal thoughts 0 0 0  PHQ-9 Score 3 2  2    Difficult doing  work/chores Not difficult at all Not difficult at all      Data saved with a previous flowsheet row definition    History [name] has a past medical history of Anemia, Aneurysmal bone cyst, other site (10/22/2013), Anxiety, ARF (acute renal failure) (07/16/2019), Arthritis, Asthma, Chronic insomnia (10/27/2012), Colon polyps, Complex tear of medial meniscus of right knee as current injury (10/22/2013), Depression, Diabetes mellitus without complication (HCC), Elevated troponin (11/10/2023), GERD (gastroesophageal reflux disease), Headache, High cholesterol, Hypertension, Lithium  toxicity (06/2019), PONV (postoperative nausea and vomiting), Primary insomnia (08/20/2019), Shortness of breath dyspnea, Sleep apnea, SVT (supraventricular tachycardia), Syncope and collapse, and Total knee replacement status (04/16/2014).   She has a past surgical history that includes Endometrial ablation w/ novasure; Carpal tunnel release (07/25/2005); trigger finger release surgeyr ; Tubal ligation; Knee arthroscopy; Knee arthroscopy with medial menisectomy (Right, 10/22/2013); Back surgery (07/25/2010); Colonoscopy; Excision metacarpal mass (Right, 03/10/2014); Total knee arthroplasty (Left, 04/16/2014); Irrigation and debridement knee (Left, 05/23/2014); Total knee arthroplasty (Right, 12/23/2014); Excisional total knee arthroplasty with antibiotic spacers (Left, 09/19/2019); Joint replacement; and Spine surgery.   Her family history includes Alcohol  abuse in her paternal grandfather; Arthritis in her mother; Breast cancer in her maternal grandmother; Cancer in her brother, daughter, and maternal grandmother; Gout in her father; Heart attack in her maternal grandfather; Heart disease in her mother; Heart disease (age of onset: 4) in  her father; Hyperlipidemia in her father; Hypertension in her father and sister; Tuberculosis in her paternal grandfather.She reports that she has never smoked. She has never used smokeless  tobacco. She reports that she does not drink alcohol  and does not use drugs.    ROS Review of Systems  Objective:  BP 135/77   Pulse 63   Temp (!) 97.3 F (36.3 C)   Ht 5' 4 (1.626 m)   Wt (!) 307 lb 8 oz (139.5 kg)   SpO2 93%   BMI 52.78 kg/m   BP Readings from Last 3 Encounters:  08/29/24 135/77  05/28/24 121/67  04/16/24 136/80    Wt Readings from Last 3 Encounters:  08/29/24 (!) 307 lb 8 oz (139.5 kg)  05/28/24 (!) 315 lb (142.9 kg)  04/16/24 (!) 323 lb 3.2 oz (146.6 kg)     Physical Exam Physical Exam VITALS: BP- 135/77 GENERAL: Alert, cooperative, well developed, no acute distress. HEENT: Normocephalic, normal oropharynx, moist mucous membranes. CHEST: Clear to auscultation bilaterally, no wheezes, rhonchi, or crackles. CARDIOVASCULAR: Normal heart rate and rhythm, S1 and S2 normal without murmurs. Heart sounds normal. ABDOMEN: Soft, non-tender, non-distended, without organomegaly, normal bowel sounds. EXTREMITIES: No cyanosis or edema. NEUROLOGICAL: Cranial nerves grossly intact, moves all extremities without gross motor or sensory deficit.   Assessment & Plan:  Diabetes mellitus, new onset (HCC) -     Bayer DCA Hb A1c Waived -     CBC with Differential/Platelet -     Microalbumin / creatinine urine ratio -     Vitamin B12  Hypothyroidism, unspecified type -     TSH  Mixed hyperlipidemia -     Lipid panel -     CMP14+EGFR  Other orders -     Tirzepatide ; Inject 10 mg into the skin once a week.  Dispense: 6 mL; Refill: 3    Assessment and Plan Assessment & Plan Type 2 diabetes mellitus   Managed with Mounjaro  and metformin . A1c results are pending. No hypoglycemic symptoms reported, and nausea is not due to hypoglycemia. Plan to increase Mounjaro  to 10 mg after prior authorization to enhance weight loss and potentially reduce metformin  dosage. Continue metformin  once daily. Review A1c results when available.  Obesity   She has lost 8 pounds  over the last three months, averaging two-thirds of a pound per week. The goal is to increase weight loss to at least half a pound per week. Dietary habits are being adjusted to include healthier options and reduce carbohydrate intake. Increased Mounjaro  to 10 mg to aid in weight loss. Encourage healthy eating habits, focusing on protein and vegetables. Advise against high-calorie foods, especially when hungry.  Hypertension   Well-controlled with a recent reading of 135/77 mmHg, improved from 159/91 mmHg a year ago. The goal is to achieve a reading of 130/80 mmHg or below. Weight loss is expected to further improve blood pressure control. Continue current management without additional antihypertensive medication. Encourage continued weight loss to further improve blood pressure control.       Follow-up: No follow-ups on file.  Butler Der, M.D. "

## 2024-08-30 LAB — CBC WITH DIFFERENTIAL/PLATELET
Basophils Absolute: 0 10*3/uL (ref 0.0–0.2)
Basos: 1 %
EOS (ABSOLUTE): 0.1 10*3/uL (ref 0.0–0.4)
Eos: 1 %
Hematocrit: 42.7 % (ref 34.0–46.6)
Hemoglobin: 14 g/dL (ref 11.1–15.9)
Immature Grans (Abs): 0 10*3/uL (ref 0.0–0.1)
Immature Granulocytes: 0 %
Lymphocytes Absolute: 2 10*3/uL (ref 0.7–3.1)
Lymphs: 29 %
MCH: 31.9 pg (ref 26.6–33.0)
MCHC: 32.8 g/dL (ref 31.5–35.7)
MCV: 97 fL (ref 79–97)
Monocytes Absolute: 0.5 10*3/uL (ref 0.1–0.9)
Monocytes: 8 %
Neutrophils Absolute: 4.3 10*3/uL (ref 1.4–7.0)
Neutrophils: 61 %
Platelets: 197 10*3/uL (ref 150–450)
RBC: 4.39 x10E6/uL (ref 3.77–5.28)
RDW: 13 % (ref 11.7–15.4)
WBC: 6.9 10*3/uL (ref 3.4–10.8)

## 2024-08-30 LAB — CMP14+EGFR
ALT: 19 [IU]/L (ref 0–32)
AST: 24 [IU]/L (ref 0–40)
Albumin: 4.5 g/dL (ref 3.9–4.9)
Alkaline Phosphatase: 116 [IU]/L (ref 49–135)
BUN/Creatinine Ratio: 15 (ref 12–28)
BUN: 16 mg/dL (ref 8–27)
Bilirubin Total: 0.4 mg/dL (ref 0.0–1.2)
CO2: 21 mmol/L (ref 20–29)
Calcium: 9.3 mg/dL (ref 8.7–10.3)
Chloride: 105 mmol/L (ref 96–106)
Creatinine, Ser: 1.04 mg/dL — ABNORMAL HIGH (ref 0.57–1.00)
Globulin, Total: 2.2 g/dL (ref 1.5–4.5)
Glucose: 82 mg/dL (ref 70–99)
Potassium: 4.7 mmol/L (ref 3.5–5.2)
Sodium: 142 mmol/L (ref 134–144)
Total Protein: 6.7 g/dL (ref 6.0–8.5)
eGFR: 61 mL/min/{1.73_m2}

## 2024-08-30 LAB — LIPID PANEL
Chol/HDL Ratio: 4 ratio (ref 0.0–4.4)
Cholesterol, Total: 124 mg/dL (ref 100–199)
HDL: 31 mg/dL — ABNORMAL LOW
LDL Chol Calc (NIH): 63 mg/dL (ref 0–99)
Triglycerides: 176 mg/dL — ABNORMAL HIGH (ref 0–149)
VLDL Cholesterol Cal: 30 mg/dL (ref 5–40)

## 2024-08-30 LAB — VITAMIN B12: Vitamin B-12: 341 pg/mL (ref 232–1245)

## 2024-08-30 LAB — MICROALBUMIN / CREATININE URINE RATIO
Creatinine, Urine: 202.1 mg/dL
Microalb/Creat Ratio: 7 mg/g{creat} (ref 0–29)
Microalbumin, Urine: 14.3 ug/mL

## 2024-08-30 LAB — TSH: TSH: 1.44 u[IU]/mL (ref 0.450–4.500)

## 2024-08-30 NOTE — Progress Notes (Signed)
 [name]                                          MRN: [id number]   08/30/2024   The [facility] Quality Team Specialist reviewed this patient medical record for the purposes of chart review for care gap closure. The following were reviewed: chart review for care gap closure-kidney health evaluation for diabetes:eGFR  and uACR.    [facility] Quality Team

## 2024-09-24 ENCOUNTER — Ambulatory Visit: Admitting: Pulmonary Disease

## 2024-11-08 ENCOUNTER — Ambulatory Visit: Admitting: Student

## 2024-11-26 ENCOUNTER — Ambulatory Visit: Admitting: Family Medicine
